# Patient Record
Sex: Male | Born: 1955 | Race: White | Hispanic: No | Marital: Married | Smoking: Former smoker
Health system: Southern US, Community
[De-identification: ages and names within clinical notes are randomized; demographics above are authoritative.]

## PROBLEM LIST (undated history)

## (undated) DIAGNOSIS — I219 Acute myocardial infarction, unspecified: Secondary | ICD-10-CM

## (undated) DIAGNOSIS — K08109 Complete loss of teeth, unspecified cause, unspecified class: Secondary | ICD-10-CM

## (undated) DIAGNOSIS — J8410 Pulmonary fibrosis, unspecified: Secondary | ICD-10-CM

## (undated) DIAGNOSIS — G2581 Restless legs syndrome: Secondary | ICD-10-CM

## (undated) DIAGNOSIS — E7800 Pure hypercholesterolemia, unspecified: Secondary | ICD-10-CM

## (undated) DIAGNOSIS — K219 Gastro-esophageal reflux disease without esophagitis: Secondary | ICD-10-CM

## (undated) DIAGNOSIS — J449 Chronic obstructive pulmonary disease, unspecified: Secondary | ICD-10-CM

## (undated) DIAGNOSIS — R053 Chronic cough: Secondary | ICD-10-CM

## (undated) DIAGNOSIS — Z9981 Dependence on supplemental oxygen: Secondary | ICD-10-CM

## (undated) DIAGNOSIS — I2510 Atherosclerotic heart disease of native coronary artery without angina pectoris: Secondary | ICD-10-CM

## (undated) HISTORY — PX: CORONARY ANGIOPLASTY: SHX604

## (undated) HISTORY — PX: COLONOSCOPY: SHX174

## (undated) HISTORY — PX: BACK SURGERY: SHX140

## (undated) HISTORY — DX: Restless legs syndrome: G25.81

## (undated) HISTORY — PX: OTHER SURGICAL HISTORY: SHX169

---

## 2002-04-05 ENCOUNTER — Encounter: Payer: Self-pay | Admitting: Neurosurgery

## 2002-04-05 ENCOUNTER — Ambulatory Visit (HOSPITAL_COMMUNITY): Admission: RE | Admit: 2002-04-05 | Discharge: 2002-04-05 | Payer: Self-pay | Admitting: Neurosurgery

## 2002-04-06 ENCOUNTER — Ambulatory Visit (HOSPITAL_COMMUNITY): Admission: RE | Admit: 2002-04-06 | Discharge: 2002-04-06 | Payer: Self-pay | Admitting: Neurosurgery

## 2002-04-06 ENCOUNTER — Encounter: Payer: Self-pay | Admitting: Neurosurgery

## 2002-04-15 ENCOUNTER — Encounter: Payer: Self-pay | Admitting: Neurosurgery

## 2002-04-15 ENCOUNTER — Ambulatory Visit (HOSPITAL_COMMUNITY): Admission: RE | Admit: 2002-04-15 | Discharge: 2002-04-15 | Payer: Self-pay | Admitting: Neurosurgery

## 2008-10-20 HISTORY — PX: ANGIOPLASTY: SHX39

## 2009-05-27 ENCOUNTER — Emergency Department: Payer: Self-pay | Admitting: Emergency Medicine

## 2011-09-19 ENCOUNTER — Ambulatory Visit: Payer: Self-pay

## 2014-03-05 ENCOUNTER — Emergency Department: Payer: Self-pay | Admitting: Emergency Medicine

## 2014-03-06 LAB — BASIC METABOLIC PANEL
Anion Gap: 5 — ABNORMAL LOW (ref 7–16)
BUN: 9 mg/dL (ref 7–18)
Calcium, Total: 8.3 mg/dL — ABNORMAL LOW (ref 8.5–10.1)
Chloride: 107 mmol/L (ref 98–107)
Co2: 27 mmol/L (ref 21–32)
Creatinine: 0.84 mg/dL (ref 0.60–1.30)
EGFR (African American): >60
EGFR (Non-African Amer.): >60
Glucose: 111 mg/dL — ABNORMAL HIGH (ref 65–99)
Osmolality: 277 (ref 275–301)
Potassium: 3.7 mmol/L (ref 3.5–5.1)
Sodium: 139 mmol/L (ref 136–145)

## 2014-03-06 LAB — CBC WITH DIFFERENTIAL/PLATELET
Basophil #: 0 10*3/uL (ref 0.0–0.1)
Basophil %: 0.8 %
Eosinophil #: 0.3 10*3/uL (ref 0.0–0.7)
Eosinophil %: 4.8 %
HCT: 40.8 % (ref 40.0–52.0)
HGB: 14 g/dL (ref 13.0–18.0)
Lymphocyte #: 1.8 10*3/uL (ref 1.0–3.6)
Lymphocyte %: 30.6 %
MCH: 33.2 pg (ref 26.0–34.0)
MCHC: 34.2 g/dL (ref 32.0–36.0)
MCV: 97 fL (ref 80–100)
Monocyte #: 0.6 x10 3/mm (ref 0.2–1.0)
Monocyte %: 10.3 %
Neutrophil #: 3.2 10*3/uL (ref 1.4–6.5)
Neutrophil %: 53.5 %
Platelet: 209 10*3/uL (ref 150–440)
RBC: 4.21 10*6/uL — ABNORMAL LOW (ref 4.40–5.90)
RDW: 13.3 % (ref 11.5–14.5)
WBC: 5.9 10*3/uL (ref 3.8–10.6)

## 2014-03-06 LAB — URINALYSIS, COMPLETE
Bacteria: NONE SEEN
Bilirubin,UR: NEGATIVE
Blood: NEGATIVE
Glucose,UR: NEGATIVE mg/dL (ref 0–75)
Ketone: NEGATIVE
Leukocyte Esterase: NEGATIVE
Nitrite: NEGATIVE
Ph: 5 (ref 4.5–8.0)
Protein: NEGATIVE
RBC,UR: 1 /HPF (ref 0–5)
Specific Gravity: 1.025 (ref 1.003–1.030)
Squamous Epithelial: 2
WBC UR: 5 /HPF (ref 0–5)

## 2014-03-06 LAB — TROPONIN I: Troponin-I: <0.02 ng/mL

## 2015-07-20 ENCOUNTER — Other Ambulatory Visit: Payer: Self-pay | Admitting: Cardiology

## 2015-07-20 DIAGNOSIS — M5417 Radiculopathy, lumbosacral region: Secondary | ICD-10-CM

## 2015-07-25 ENCOUNTER — Ambulatory Visit
Admission: RE | Admit: 2015-07-25 | Discharge: 2015-07-25 | Disposition: A | Discharge status: Home / Self Care | Payer: PRIVATE HEALTH INSURANCE | Source: Ambulatory Visit | Attending: Cardiology | Admitting: Cardiology

## 2015-07-25 DIAGNOSIS — M5126 Other intervertebral disc displacement, lumbar region: Secondary | ICD-10-CM | POA: Diagnosis not present

## 2015-07-25 DIAGNOSIS — M4807 Spinal stenosis, lumbosacral region: Secondary | ICD-10-CM | POA: Diagnosis not present

## 2015-07-25 DIAGNOSIS — M5417 Radiculopathy, lumbosacral region: Secondary | ICD-10-CM | POA: Diagnosis present

## 2017-10-28 ENCOUNTER — Encounter: Payer: Self-pay | Admitting: Emergency Medicine

## 2017-10-28 ENCOUNTER — Emergency Department
Admission: EM | Admit: 2017-10-28 | Discharge: 2017-10-28 | Disposition: A | Discharge status: Home / Self Care | Payer: No Typology Code available for payment source | Attending: Emergency Medicine | Admitting: Emergency Medicine

## 2017-10-28 ENCOUNTER — Other Ambulatory Visit: Payer: Self-pay

## 2017-10-28 DIAGNOSIS — Y939 Activity, unspecified: Secondary | ICD-10-CM | POA: Diagnosis not present

## 2017-10-28 DIAGNOSIS — Y92410 Unspecified street and highway as the place of occurrence of the external cause: Secondary | ICD-10-CM | POA: Insufficient documentation

## 2017-10-28 DIAGNOSIS — S3992XA Unspecified injury of lower back, initial encounter: Secondary | ICD-10-CM | POA: Diagnosis present

## 2017-10-28 DIAGNOSIS — I2510 Atherosclerotic heart disease of native coronary artery without angina pectoris: Secondary | ICD-10-CM | POA: Diagnosis not present

## 2017-10-28 DIAGNOSIS — S39012A Strain of muscle, fascia and tendon of lower back, initial encounter: Secondary | ICD-10-CM | POA: Insufficient documentation

## 2017-10-28 DIAGNOSIS — Y999 Unspecified external cause status: Secondary | ICD-10-CM | POA: Insufficient documentation

## 2017-10-28 HISTORY — DX: Atherosclerotic heart disease of native coronary artery without angina pectoris: I25.10

## 2017-10-28 HISTORY — DX: Pure hypercholesterolemia, unspecified: E78.00

## 2017-10-28 MED ORDER — IBUPROFEN 600 MG PO TABS: 600.0000 mg | ORAL_TABLET | Freq: Three times a day (TID) | ORAL | 0 refills | Status: DC | PRN

## 2017-10-28 MED ORDER — NAPROXEN 500 MG PO TABS
500.0000 mg | ORAL_TABLET | Freq: Once | ORAL | Status: AC
  Administered 2017-10-28: 500 mg via ORAL
  Filled 2017-10-28: qty 1

## 2017-10-28 MED ORDER — CYCLOBENZAPRINE HCL 10 MG PO TABS
10.0000 mg | ORAL_TABLET | Freq: Once | ORAL | Status: AC
  Administered 2017-10-28: 10 mg via ORAL
  Filled 2017-10-28: qty 1

## 2017-10-28 MED ORDER — CYCLOBENZAPRINE HCL 10 MG PO TABS: 10.0000 mg | ORAL_TABLET | Freq: Three times a day (TID) | ORAL | 0 refills | Status: DC | PRN
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
## 2018-02-08 ENCOUNTER — Telehealth: Payer: Self-pay

## 2018-02-08 NOTE — Telephone Encounter (Signed)
Gastroenterology Pre-Procedure Review  Request Date: 03/05/2018 Requesting Physician: Dr. Bonna Gains   PATIENT REVIEW QUESTIONS: The patient responded to the following health history questions as indicated:    1. Are you having any GI issues? No  2. Do you have a personal history of Polyps? No  3. Do you have a family history of Colon Cancer or Polyps? No  4. Diabetes Mellitus? No  5. Joint replacements in the past 12 months? No  6. Major health problems in the past 3 months? No  7. Any artificial heart valves, MVP, or defibrillator? Yes, 2 stents, done 2010     MEDICATIONS & ALLERGIES:    Patient reports the following regarding taking any anticoagulation/antiplatelet therapy:   Plavix, Coumadin, Eliquis, Xarelto, Lovenox, Pradaxa, Brilinta, or Effient? Plavix  Aspirin? No   Patient confirms/reports the following medications:  Current Outpatient Medications  Medication Sig Dispense Refill  . clopidogrel (PLAVIX) 75 MG tablet Take 75 mg by mouth every other day. Taking 1/2 tabled every other day    . docusate sodium (COLACE) 100 MG capsule Take 100 mg by mouth 2 (two) times daily.    Marland Kitchen loratadine (CLARITIN) 10 MG tablet Take 10 mg by mouth daily as needed for allergies.    . niacin (NIASPAN) 500 MG CR tablet Take 500 mg by mouth at bedtime.    . nitroGLYCERIN (NITROSTAT) 0.3 MG SL tablet Place 0.3 mg under the tongue every 5 (five) minutes as needed for chest pain.     No current facility-administered medications for this visit.     Patient confirms/reports the following allergies:  Allergies  Allergen Reactions  . Ace Inhibitors Swelling    No orders of the defined types were placed in this encounter.   AUTHORIZATION INFORMATION Primary Insurance: 1D#: Group #:  Secondary Insurance: 1D#: Group #:  SCHEDULE INFORMATION: Date: 03/05/2018  Time: Location: [place]

## 2018-02-11 ENCOUNTER — Other Ambulatory Visit: Payer: Self-pay

## 2018-02-11 ENCOUNTER — Encounter: Payer: Self-pay | Admitting: Gastroenterology

## 2018-02-11 DIAGNOSIS — Z1211 Encounter for screening for malignant neoplasm of colon: Secondary | ICD-10-CM

## 2018-03-04 ENCOUNTER — Encounter: Payer: Self-pay | Admitting: *Deleted

## 2018-03-05 ENCOUNTER — Encounter
Admission: RE | Disposition: A | Discharge status: Home / Self Care | Payer: Self-pay | Source: Ambulatory Visit | Attending: Gastroenterology

## 2018-03-05 ENCOUNTER — Encounter: Payer: Self-pay | Admitting: *Deleted

## 2018-03-05 ENCOUNTER — Ambulatory Visit: Payer: Managed Care, Other (non HMO) | Admitting: Anesthesiology

## 2018-03-05 ENCOUNTER — Ambulatory Visit
Admission: RE | Admit: 2018-03-05 | Discharge: 2018-03-05 | Disposition: A | Discharge status: Home / Self Care | Payer: Managed Care, Other (non HMO) | Source: Ambulatory Visit | Attending: Gastroenterology | Admitting: Gastroenterology

## 2018-03-05 DIAGNOSIS — Z955 Presence of coronary angioplasty implant and graft: Secondary | ICD-10-CM | POA: Diagnosis not present

## 2018-03-05 DIAGNOSIS — Z888 Allergy status to other drugs, medicaments and biological substances status: Secondary | ICD-10-CM | POA: Diagnosis not present

## 2018-03-05 DIAGNOSIS — Z7902 Long term (current) use of antithrombotics/antiplatelets: Secondary | ICD-10-CM | POA: Insufficient documentation

## 2018-03-05 DIAGNOSIS — K625 Hemorrhage of anus and rectum: Secondary | ICD-10-CM | POA: Diagnosis not present

## 2018-03-05 DIAGNOSIS — K6289 Other specified diseases of anus and rectum: Secondary | ICD-10-CM

## 2018-03-05 DIAGNOSIS — Z87891 Personal history of nicotine dependence: Secondary | ICD-10-CM | POA: Diagnosis not present

## 2018-03-05 DIAGNOSIS — K648 Other hemorrhoids: Secondary | ICD-10-CM | POA: Diagnosis not present

## 2018-03-05 DIAGNOSIS — I2510 Atherosclerotic heart disease of native coronary artery without angina pectoris: Secondary | ICD-10-CM | POA: Insufficient documentation

## 2018-03-05 DIAGNOSIS — D122 Benign neoplasm of ascending colon: Secondary | ICD-10-CM | POA: Diagnosis not present

## 2018-03-05 DIAGNOSIS — K5730 Diverticulosis of large intestine without perforation or abscess without bleeding: Secondary | ICD-10-CM | POA: Diagnosis not present

## 2018-03-05 DIAGNOSIS — K621 Rectal polyp: Secondary | ICD-10-CM | POA: Diagnosis not present

## 2018-03-05 DIAGNOSIS — Z1211 Encounter for screening for malignant neoplasm of colon: Secondary | ICD-10-CM | POA: Insufficient documentation

## 2018-03-05 DIAGNOSIS — Z0000 Encounter for general adult medical examination without abnormal findings: Secondary | ICD-10-CM

## 2018-03-05 DIAGNOSIS — Z79899 Other long term (current) drug therapy: Secondary | ICD-10-CM | POA: Diagnosis not present

## 2018-03-05 HISTORY — PX: COLONOSCOPY WITH PROPOFOL: SHX5780

## 2018-03-05 SURGERY — COLONOSCOPY WITH PROPOFOL
Anesthesia: General

## 2018-03-05 MED ORDER — LIDOCAINE HCL (PF) 1 % IJ SOLN
INTRAMUSCULAR | Status: AC
  Administered 2018-03-05: 0.3 mL
  Filled 2018-03-05: qty 2

## 2018-03-05 MED ORDER — PROPOFOL 10 MG/ML IV BOLUS
INTRAVENOUS | Status: DC | PRN
  Administered 2018-03-05: 60 mg via INTRAVENOUS

## 2018-03-05 MED ORDER — PHENYLEPHRINE HCL 10 MG/ML IJ SOLN
INTRAMUSCULAR | Status: DC | PRN
  Administered 2018-03-05 (×2): 100 ug via INTRAVENOUS

## 2018-03-05 MED ORDER — PROPOFOL 500 MG/50ML IV EMUL
INTRAVENOUS | Status: AC
  Filled 2018-03-05: qty 50

## 2018-03-05 MED ORDER — SODIUM CHLORIDE 0.9 % IV SOLN
INTRAVENOUS | Status: DC
  Administered 2018-03-05: 1000 mL via INTRAVENOUS

## 2018-03-05 MED ORDER — PROPOFOL 500 MG/50ML IV EMUL
INTRAVENOUS | Status: DC | PRN
  Administered 2018-03-05: 150 ug/kg/min via INTRAVENOUS

## 2018-03-05 NOTE — Anesthesia Preprocedure Evaluation (Signed)
Anesthesia Evaluation  Patient identified by MRN, date of birth, ID band Patient awake    Reviewed: Allergy & Precautions, H&P , NPO status , Patient's Chart, lab work & pertinent test results, reviewed documented beta blocker date and time   History of Anesthesia Complications Negative for: history of anesthetic complications  Airway Mallampati: III  TM Distance: >3 FB Neck ROM: full    Dental  (+) Edentulous Upper, Dental Advidsory Given, Edentulous Lower   Pulmonary neg pulmonary ROS, former smoker,           Cardiovascular Exercise Tolerance: Good (-) hypertension(-) angina+ CAD and + Cardiac Stents  (-) Past MI and (-) CABG (-) dysrhythmias (-) Valvular Problems/Murmurs     Neuro/Psych negative neurological ROS  negative psych ROS   GI/Hepatic negative GI ROS, Neg liver ROS,   Endo/Other  negative endocrine ROS  Renal/GU negative Renal ROS  negative genitourinary   Musculoskeletal   Abdominal   Peds  Hematology negative hematology ROS (+)   Anesthesia Other Findings Past Medical History: No date: Coronary artery disease No date: Elevated cholesterol No date: Restless leg syndrome   Reproductive/Obstetrics negative OB ROS                             Anesthesia Physical Anesthesia Plan  ASA: II  Anesthesia Plan: General   Post-op Pain Management:    Induction: Intravenous  PONV Risk Score and Plan: 2 and Propofol infusion  Airway Management Planned: Natural Airway and Nasal Cannula  Additional Equipment:   Intra-op Plan:   Post-operative Plan:   Informed Consent: I have reviewed the patients History and Physical, chart, labs and discussed the procedure including the risks, benefits and alternatives for the proposed anesthesia with the patient or authorized representative who has indicated his/her understanding and acceptance.   Dental Advisory Given  Plan Discussed  with: Anesthesiologist, CRNA and Surgeon  Anesthesia Plan Comments:         Anesthesia Quick Evaluation

## 2018-03-05 NOTE — Op Note (Signed)
[facility] Gastroenterology Patient Name: [place] [name] Procedure Date: 03/05/2018 8:04 AM MRN: [id number] Account #: [id number] Date of Birth: July 09, 1956 Admit Type: Outpatient Age: 62 Room: [facility] ENDO ROOM 2 Gender: Male Note Status: Finalized Procedure:            Colonoscopy Indications:          Screening for colorectal malignant neoplasm, Incidental                        - Rectal bleeding Providers:            Ilyanna Baillargeon B. Bonna Gains MD, MD Referring MD:         [name], MD (Referring MD) Medicines:            Monitored Anesthesia Care Complications:        No immediate complications. Procedure:            Pre-Anesthesia Assessment:                       - ASA Grade Assessment: II - A patient with mild                        systemic disease.                       - Prior to the procedure, a History and Physical was                        performed, and patient medications, allergies and                        sensitivities were reviewed. The patient's tolerance of                        previous anesthesia was reviewed.                       - The risks and benefits of the procedure and the                        sedation options and risks were discussed with the                        patient. All questions were answered and informed                        consent was obtained.                       - Patient identification and proposed procedure were                        verified prior to the procedure by the physician, the                        nurse, the anesthesiologist, the anesthetist and the                        technician. The procedure was verified in the procedure  room.                       After obtaining informed consent, the colonoscope was                        passed under direct vision. Throughout the procedure,                        the patient's blood pressure, pulse, and oxygen   saturations were monitored continuously. The                        Colonoscope was introduced through the anus and                        advanced to the the cecum, identified by appendiceal                        orifice and ileocecal valve. The colonoscopy was                        performed with ease. The patient tolerated the                        procedure well. The quality of the bowel preparation                        was good. Findings:      The perianal and digital rectal examinations were normal.      Four sessile polyps were found in the rectum and ascending colon. The       polyps were 2 to 3 mm in size. These polyps were removed with a cold       biopsy forceps. Resection and retrieval were complete.      Multiple diverticula were found in the sigmoid colon.      A patchy area of mildly erythematous mucosa was found in the rectum.       This was biopsied with a cold forceps for histology.      The exam was otherwise without abnormality.      The rectum, sigmoid colon, descending colon, transverse colon, ascending       colon and cecum appeared normal.      Non-bleeding internal hemorrhoids were found during retroflexion. Impression:           This is the source of patient's intermittent BRBPR.                       - Four 2 to 3 mm polyps in the rectum and in the                        ascending colon, removed with a cold biopsy forceps.                        Resected and retrieved.                       - Diverticulosis in the sigmoid colon.                       - Erythematous mucosa in the rectum. Biopsied.                       -  The examination was otherwise normal.                       - The rectum, sigmoid colon, descending colon,                        transverse colon, ascending colon and cecum are normal.                       - Non-bleeding internal hemorrhoids. Recommendation:       - Discharge patient to home (with escort).                       - Advance  diet as tolerated.                       - Continue present medications.                       - Await pathology results.                       - Repeat colonoscopy in 5 years for surveillance.                       - The findings and recommendations were discussed with                        the patient.                       - The findings and recommendations were discussed with                        the patient's family.                       - Return to primary care physician as previously                        scheduled.                       - High fiber diet. Procedure Code(s):    --- Professional ---                       [phone], Colonoscopy, flexible; with biopsy, single or                        multiple Diagnosis Code(s):    --- Professional ---                       Z12.11, Encounter for screening for malignant neoplasm                        of colon                       K62.1, Rectal polyp                       D12.2, Benign neoplasm of ascending colon  K64.8, Other hemorrhoids                       K62.89, Other specified diseases of anus and rectum                       K57.30, Diverticulosis of large intestine without                        perforation or abscess without bleeding CPT copyright 2017 American Medical Association. All rights reserved. The codes documented in this report are preliminary and upon coder review may  be revised to meet current compliance requirements.  Vonda Antigua, MD Margretta Sidle B. Bonna Gains MD, MD 03/05/2018 8:59:52 AM This report has been signed electronically. Number of Addenda: 0 Note Initiated On: 03/05/2018 8:04 AM Scope Withdrawal Time: 0 hours 34 minutes 37 seconds  Total Procedure Duration: 0 hours 40 minutes 26 seconds  Estimated Blood Loss: Estimated blood loss: none.      [facility]

## 2018-03-05 NOTE — H&P (Signed)
[place] Antigua, MD [address] [place], [place], [place], [address] [id number], [place], [name], [place], [address] Phone: [phone]  Fax: [phone]  Primary Care Physician:  [place] Athens, MD   Pre-Procedure History & Physical: HPI:  [place] [name] is a 62 y.o. male is here for a colonoscopy.   Past Medical History:  Diagnosis Date  . Coronary artery disease   . Elevated cholesterol   . Restless leg syndrome     Past Surgical History:  Procedure Laterality Date  . ANGIOPLASTY  2010   2 stents placed  . BACK SURGERY    . CORONARY ANGIOPLASTY      Prior to Admission medications   Medication Sig Start Date End Date Taking? Authorizing Provider  clopidogrel (PLAVIX) 75 MG tablet Take 75 mg by mouth every other day. Taking 1/2 tabled every other day   Yes [provider]  loratadine (CLARITIN) 10 MG tablet Take 10 mg by mouth daily as needed for allergies.   Yes [provider]  niacin (NIASPAN) 500 MG CR tablet Take 500 mg by mouth at bedtime.   Yes [provider]  docusate sodium (COLACE) 100 MG capsule Take 100 mg by mouth 2 (two) times daily.    [provider]  nitroGLYCERIN (NITROSTAT) 0.3 MG SL tablet Place 0.3 mg under the tongue every 5 (five) minutes as needed for chest pain.    [provider]    Allergies as of 02/11/2018 - Review Complete 10/28/2017  Allergen Reaction Noted  . Ace inhibitors Swelling 10/28/2017    History reviewed. No pertinent family history.  Social History   Socioeconomic History  . Marital status: Married    Spouse name: Not on file  . Number of children: Not on file  . Years of education: Not on file  . Highest education level: Not on file  Occupational History  . Not on file  Social Needs  . Financial resource strain: Not on file  . Food insecurity:    Worry: Not on file    Inability: Not on file  . Transportation needs:    Medical: Not on file    Non-medical:  Not on file  Tobacco Use  . Smoking status: Former Smoker    Last attempt to quit: 03/05/2009    Years since quitting: 9.0  . Smokeless tobacco: Never Used  Substance and Sexual Activity  . Alcohol use: Yes    Frequency: Never  . Drug use: Not Currently  . Sexual activity: Not on file  Lifestyle  . Physical activity:    Days per week: Not on file    Minutes per session: Not on file  . Stress: Not on file  Relationships  . Social connections:    Talks on phone: Not on file    Gets together: Not on file    Attends religious service: Not on file    Active member of club or organization: Not on file    Attends meetings of clubs or organizations: Not on file    Relationship status: Not on file  . Intimate partner violence:    Fear of current or ex partner: Not on file    Emotionally abused: Not on file    Physically abused: Not on file    Forced sexual activity: Not on file  Other Topics Concern  . Not on file  Social History Narrative  . Not on file    Review of Systems: See HPI, otherwise negative ROS  Physical Exam: BP 138/89   Pulse 78   Temp 97.6 F (36.4 C) (Tympanic)   Resp 18   Ht 6\' 2"  (1.88 m)   Wt 220 lb (99.8 kg)   SpO2 97%   BMI 28.25 kg/m  General:   Alert,  pleasant and cooperative in NAD Head:  Normocephalic and atraumatic. Neck:  Supple; no masses or thyromegaly. Lungs:  Clear throughout to auscultation, normal respiratory effort.    Heart:  +S1, +S2, Regular rate and rhythm, No edema. Abdomen:  Soft, nontender and nondistended. Normal bowel sounds, without guarding, and without rebound.   Neurologic:  Alert and  oriented x4;  grossly normal neurologically.  Impression/Plan: [name] is here for a colonoscopy to be performed for average risk screening. Also reports intermittent blood on toilet paper over the last month.   Risks, benefits, limitations, and alternatives regarding  colonoscopy have been reviewed with the patient.  Questions  have been answered.  All parties agreeable.   Virgel Manifold, MD  03/05/2018, 7:57 AM

## 2018-03-05 NOTE — Transfer of Care (Signed)
Immediate Anesthesia Transfer of Care Note  Patient: [place] [name]  Procedure(s) Performed: COLONOSCOPY WITH PROPOFOL (N/A )  Patient Location: PACU  Anesthesia Type:General  Level of Consciousness: sedated  Airway & Oxygen Therapy: Patient Spontanous Breathing and Patient connected to nasal cannula oxygen  Post-op Assessment: Report given to RN and Post -op Vital signs reviewed and stable  Post vital signs: Reviewed and stable  Last Vitals:  Vitals Value Taken Time  BP 104/78 03/05/2018  8:58 AM  Temp 36.1 C 03/05/2018  8:50 AM  Pulse 62 03/05/2018  8:58 AM  Resp 17 03/05/2018  8:58 AM  SpO2 100 % 03/05/2018  8:58 AM  Vitals shown include unvalidated device data.  Last Pain:  Vitals:   03/05/18 0850  TempSrc: Tympanic  PainSc:          Complications: No apparent anesthesia complications

## 2018-03-05 NOTE — Anesthesia Post-op Follow-up Note (Signed)
Anesthesia QCDR form completed.        

## 2018-03-05 NOTE — Anesthesia Procedure Notes (Signed)
Date/Time: 03/05/2018 8:12 AM Performed by: Nelda Marseille, CRNA Pre-anesthesia Checklist: Patient identified, Emergency Drugs available, Suction available, Patient being monitored and Timeout performed

## 2018-03-06 NOTE — Anesthesia Postprocedure Evaluation (Signed)
Anesthesia Post Note  Patient: [place] [name]  Procedure(s) Performed: COLONOSCOPY WITH PROPOFOL (N/A )  Patient location during evaluation: Endoscopy Anesthesia Type: General Level of consciousness: awake and alert Pain management: pain level controlled Vital Signs Assessment: post-procedure vital signs reviewed and stable Respiratory status: spontaneous breathing, nonlabored ventilation, respiratory function stable and patient connected to nasal cannula oxygen Cardiovascular status: blood pressure returned to baseline and stable Postop Assessment: no apparent nausea or vomiting Anesthetic complications: no     Last Vitals:  Vitals:   03/05/18 0910 03/05/18 0920  BP: 119/78 134/87  Pulse: 63 61  Resp: 19 17  Temp:    SpO2: 100% 100%    Last Pain:  Vitals:   03/05/18 0850  TempSrc: Tympanic  PainSc:                  [name]

## 2018-03-08 ENCOUNTER — Encounter: Payer: Self-pay | Admitting: Gastroenterology

## 2018-03-08 LAB — SURGICAL PATHOLOGY

## 2018-03-09 ENCOUNTER — Encounter: Payer: Self-pay | Admitting: Gastroenterology

## 2018-03-16 ENCOUNTER — Telehealth: Payer: Self-pay | Admitting: Gastroenterology

## 2018-03-16 NOTE — Telephone Encounter (Signed)
Pt notified that polyps were benign, not precancerous and that a repeat colonoscopy needed in 3 yrs. Pt informed that letter was sent.

## 2018-03-16 NOTE — Telephone Encounter (Signed)
Pt is calling  To see if biopsy results are back

## 2018-06-10 ENCOUNTER — Ambulatory Visit
Admission: RE | Admit: 2018-06-10 | Discharge: 2018-06-10 | Disposition: A | Discharge status: Home / Self Care | Payer: Managed Care, Other (non HMO) | Source: Ambulatory Visit | Attending: Internal Medicine | Admitting: Internal Medicine

## 2018-06-10 ENCOUNTER — Other Ambulatory Visit: Payer: Self-pay | Admitting: Internal Medicine

## 2018-06-10 DIAGNOSIS — R05 Cough: Secondary | ICD-10-CM

## 2018-06-10 DIAGNOSIS — R059 Cough, unspecified: Secondary | ICD-10-CM

## 2018-06-10 DIAGNOSIS — R918 Other nonspecific abnormal finding of lung field: Secondary | ICD-10-CM | POA: Diagnosis not present

## 2018-06-11 ENCOUNTER — Other Ambulatory Visit: Payer: Self-pay | Admitting: Internal Medicine

## 2018-06-11 DIAGNOSIS — R05 Cough: Secondary | ICD-10-CM

## 2018-06-11 DIAGNOSIS — R058 Other specified cough: Secondary | ICD-10-CM

## 2018-06-11 DIAGNOSIS — R9389 Abnormal findings on diagnostic imaging of other specified body structures: Secondary | ICD-10-CM

## 2018-06-15 ENCOUNTER — Encounter: Payer: Self-pay | Admitting: Internal Medicine

## 2018-06-15 ENCOUNTER — Ambulatory Visit: Admission: RE | Admit: 2018-06-15 | Payer: Managed Care, Other (non HMO) | Source: Ambulatory Visit

## 2018-06-15 ENCOUNTER — Ambulatory Visit: Payer: Managed Care, Other (non HMO) | Admitting: Internal Medicine

## 2018-06-15 VITALS — BP 124/70 | HR 81 | Resp 16 | Ht 74.0 in | Wt 209.0 lb

## 2018-06-15 DIAGNOSIS — R059 Cough, unspecified: Secondary | ICD-10-CM

## 2018-06-15 DIAGNOSIS — J849 Interstitial pulmonary disease, unspecified: Secondary | ICD-10-CM

## 2018-06-15 DIAGNOSIS — R0609 Other forms of dyspnea: Secondary | ICD-10-CM | POA: Diagnosis not present

## 2018-06-15 DIAGNOSIS — R05 Cough: Secondary | ICD-10-CM | POA: Diagnosis not present

## 2018-06-15 NOTE — Patient Instructions (Addendum)
Will obtain Ct chest hi resolution in about 6 weeks and follow up after that.   Will need to obtain results of your recent blood work from Dr. Jennette Kettle office.

## 2018-06-15 NOTE — Progress Notes (Signed)
[facility] Consultation      Assessment and Plan:  Interstitial lung disease. - Interstitial changes with bronchiectasis seen on chest x-ray.  Uncertain if these changes are recent and related to his new cough, versus chronic changes.  Therefore allow his cough to travel over the next 6 to 8 weeks check a CT chest at that time. Patient is asked to follow-up after CT chest in 6 weeks time. - Patient has had recent blood work at Dr. Paticia Stack office, will obtain results.  Will consider further serological work-up if this was not done already.  Cough. - Recent URI with persistent cough.  Possibly represents a postinfectious cough. - Asked to use over-the-counter cough medicines as needed.   Date: 06/15/2018  MRN# [id number] Dillon [name] [birth year]   Dillon [name] is a 62 y.o. old male seen in consultation for chief complaint of:    Chief Complaint  Patient presents with  . Consult    Referred by Dr.Masoud for eval ILD  . Cough    yellow mucus  . Wheezing    occasional    HPI:   The patient is a 62 yo male with cough for about 2 months. It came on as a cold, the cold went away and the cough stayed.  The cough is intermittent, better on some days, it has improved slowly over the past few weeks. He was sent for a CXR which showed interstitial changes and then sent for CT of the chest but was not cleared by insurance, so it was cancelled.  Aside from the cough he feels that his breathing has been doing ok. 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 He thinks that the cough had started before he started working.  He spent much of his life working in [employer] fund[employer].  He has a dog, sleeps in bed with them.  He has some reflux, does have sinus drainage.  He smokes until 2010, about 3 ppd.  He takes plavix for MI in 2010.  He snores at night, he denies excessive sleepiness.   **CXR 06/10/18  PMHX:   Past Medical History:    Diagnosis Date  . Coronary artery disease   . Elevated cholesterol   . Restless leg syndrome    Surgical Hx:  Past Surgical History:  Procedure Laterality Date  . ANGIOPLASTY  2010   2 stents placed  . BACK SURGERY    . COLONOSCOPY WITH PROPOFOL N/A 03/05/2018   Procedure: COLONOSCOPY WITH PROPOFOL;  Surgeon: Virgel Manifold, MD;  Location: [facility] ENDOSCOPY;  Service: Endoscopy;  Laterality: N/A;  . CORONARY ANGIOPLASTY     Family Hx:  History reviewed. No pertinent family history. Social Hx:   Social History   Tobacco Use  . Smoking status: Former Smoker    Last attempt to quit: 03/05/2009    Years since quitting: 9.2  . Smokeless tobacco: Never Used  Substance Use Topics  . Alcohol use: Yes    Frequency: Never  . Drug use: Not Currently   Medication:    Current Outpatient Medications:  .  atorvastatin (LIPITOR) 40 MG tablet, Take 40 mg by mouth daily., Disp: , Rfl: 8 .  clopidogrel (PLAVIX) 75 MG tablet, Take 75 mg by mouth every other day. Taking 1/2 tabled every other day, Disp: , Rfl:  .  nitroGLYCERIN (NITROSTAT) 0.3 MG SL tablet, Place 0.3 mg under the tongue every 5 (five) minutes as needed  for chest pain., Disp: , Rfl:    Allergies:  Ace inhibitors  Review of Systems: Gen:  Denies  fever, sweats, chills HEENT: Denies blurred vision, double vision. bleeds, sore throat Cvc:  No dizziness, chest pain. Resp:   Denies cough or sputum production, shortness of breath Gi: Denies swallowing difficulty, stomach pain. Gu:  Denies bladder incontinence, burning urine Ext:   No Joint pain, stiffness. Skin: No skin rash,  hives  Endoc:  No polyuria, polydipsia. Psych: No depression, insomnia. Other:  All other systems were reviewed with the patient and were negative other that what is mentioned in the HPI.   Physical Examination:   VS: BP 124/70 (BP Location: Left Arm, Cuff Size: Large)   Pulse 81   Resp 16   Ht 6\' 2"  (1.88 m)   Wt 209 lb (94.8 kg)   SpO2 94%    BMI 26.83 kg/m   General Appearance: No distress  Neuro:without focal findings,  speech normal,  HEENT: PERRLA, EOM intact.   Pulmonary: normal breath sounds, No wheezing.  CardiovascularNormal S1,S2.  No m/r/g.   Abdomen: Benign, Soft, non-tender. Renal:  No costovertebral tenderness  GU:  No performed at this time. Endoc: No evident thyromegaly, no signs of acromegaly. Skin:   warm, no rashes, no ecchymosis  Extremities: normal, no cyanosis, clubbing.  Other findings:    LABORATORY PANEL:   CBC No results for input(s): WBC, HGB, HCT, PLT in the last 168 hours. ------------------------------------------------------------------------------------------------------------------  Chemistries  No results for input(s): NA, K, CL, CO2, GLUCOSE, BUN, CREATININE, CALCIUM, MG, AST, ALT, ALKPHOS, BILITOT in the last 168 hours.  Invalid input(s): GFRCGP ------------------------------------------------------------------------------------------------------------------  Cardiac Enzymes No results for input(s): TROPONINI in the last 168 hours. ------------------------------------------------------------  RADIOLOGY:  No results found.     Thank  you for the consultation and for allowing [facility] to assist in the care of your patient. Our recommendations are noted above.  Please contact us if we can be of further service.   Marda Stalker, M.D., F.C.C.P.  Board Certified in Internal Medicine, Pulmonary Medicine, [place], and Sleep Medicine.  [facility] Office Number: [phone]   06/15/2018

## 2018-06-17 ENCOUNTER — Telehealth: Payer: Self-pay | Admitting: Internal Medicine

## 2018-06-17 ENCOUNTER — Other Ambulatory Visit: Payer: Self-pay | Admitting: Internal Medicine

## 2018-06-17 ENCOUNTER — Other Ambulatory Visit: Payer: Self-pay | Admitting: Cardiology

## 2018-06-17 NOTE — Telephone Encounter (Signed)
Returned call to spouse and advised no new meds prescribed.

## 2018-06-17 NOTE — Telephone Encounter (Signed)
Called to advised patient of CT Chest appointment scheduled for 08/09/18 at 4:00 at [facility].  Pt was at work and wife wanted to know if Dr. Ashby Dawes had prescribed any medications for patient.  Please contact wife to advise, pt was unsure. Rhonda J Cobb

## 2018-07-06 ENCOUNTER — Telehealth: Payer: Self-pay | Admitting: Internal Medicine

## 2018-07-06 NOTE — Telephone Encounter (Signed)
Pt wife would like to know when pt CT scan is scheduled.

## 2018-07-06 NOTE — Telephone Encounter (Signed)
Called and advised pt's wife that CT Chest scan scheduled for 08/09/18 at 3:45 at Outpatient [facility] at [facility] and also F/U appointment with Dr. Juanell Fairly on Monday 08/16/18 at 4:00 pm.  Information mailed to patient. Nothing else needed at this time. Rhonda J Cobb

## 2018-08-09 ENCOUNTER — Ambulatory Visit
Admission: RE | Admit: 2018-08-09 | Discharge: 2018-08-09 | Disposition: A | Discharge status: Home / Self Care | Payer: Managed Care, Other (non HMO) | Source: Ambulatory Visit | Attending: Internal Medicine | Admitting: Internal Medicine

## 2018-08-09 DIAGNOSIS — J849 Interstitial pulmonary disease, unspecified: Secondary | ICD-10-CM | POA: Insufficient documentation

## 2018-08-09 DIAGNOSIS — I2510 Atherosclerotic heart disease of native coronary artery without angina pectoris: Secondary | ICD-10-CM | POA: Insufficient documentation

## 2018-08-09 DIAGNOSIS — J432 Centrilobular emphysema: Secondary | ICD-10-CM | POA: Insufficient documentation

## 2018-08-09 DIAGNOSIS — J438 Other emphysema: Secondary | ICD-10-CM | POA: Diagnosis not present

## 2018-08-09 DIAGNOSIS — I700 Atherosclerosis of aorta: Secondary | ICD-10-CM | POA: Insufficient documentation

## 2018-08-16 ENCOUNTER — Encounter: Payer: Self-pay | Admitting: Internal Medicine

## 2018-08-16 ENCOUNTER — Ambulatory Visit (INDEPENDENT_AMBULATORY_CARE_PROVIDER_SITE_OTHER): Payer: Managed Care, Other (non HMO) | Admitting: Internal Medicine

## 2018-08-16 DIAGNOSIS — J849 Interstitial pulmonary disease, unspecified: Secondary | ICD-10-CM

## 2018-08-16 NOTE — Progress Notes (Addendum)
[facility] Consultation      Assessment and Plan:  Interstitial lung disease. - Interstitial changes seen on Ct-hi-res. Repeat CT hi-res in one year from previous.  - Patient has had recent blood work at Dr. Paticia Stack office, will obtain results,  -- He may have HP due to work/environmental exposure. Explained that we will continue to monitor this. He is concerned about the several hundreds of dollars worth of testing/imaging that he has had. Will check with Dr. Georga Hacking office to try to coordinate what has been done already. Would like to see results from Sed rate, ESR, RF, Hypersensitivity pneumonitis panel, ANCA, ACE, Serology panel.   Cough.  - Recent URI which was likely causing cough, this is now better.  - Some of the patients cough be due to underlying ILD.   Orders Placed This Encounter  Procedures  . CT CHEST HIGH RESOLUTION   Return in about 10 months (around 06/17/2019).  Greater than 50% of the 25 minute visit was spent in counseling/coordination of care regarding testing and follow up of pulmonary fibrosis.   Date: 08/16/2018  MRN# [id number] Dillon [name] [birth year]   Dillon [name] is a 62 y.o. old male seen in consultation for chief complaint of:    No chief complaint on file.   HPI:   The patient is a 62 year old male with a history of persistent cough.  Chest x-ray was suggestive of interstitial lung disease, he was therefore sent for a CT chest high resolution which confirmed the presence of mild bilateral interstitial lung disease.  Today he feels that the breathing and coughing are much better. He does not think that his breathing has declined over the past few months or years. He does somewhat physical labor and he does not feel limited by breathing. He is not taking any inhalers current.   He used to smoked 2.5 to 3 ppd, he stopped about 9 years ago.  He has been working in [employer] fund[employer] since the age of 62 years old, and he continues to  this at 2 different jobs.  He has a dog, sleeps in bed with them.  He has minimal to no reflux, does have occasional sinus drainage.   He takes plavix for MI in 2010.  He snores at night, he denies excessive sleepiness.    **CT chest 08/09/2018>> bilateral peripheral interstitial changes with mild honeycomb change, groundglass change.  Emphysema. **CXR 06/10/18  Medication:    Current Outpatient Medications:  .  atorvastatin (LIPITOR) 40 MG tablet, Take 40 mg by mouth daily., Disp: , Rfl: 8 .  clopidogrel (PLAVIX) 75 MG tablet, Take 75 mg by mouth every other day. Taking 1/2 tabled every other day, Disp: , Rfl:  .  nitroGLYCERIN (NITROSTAT) 0.3 MG SL tablet, Place 0.3 mg under the tongue every 5 (five) minutes as needed for chest pain., Disp: , Rfl:    Allergies:  Ace inhibitors    Review of Systems:  Constitutional: Feels well. Cardiovascular: Denies chest pain, exertional chest pain.  Pulmonary: Denies hemoptysis, pleuritic chest pain.   The remainder of systems were reviewed and were found to be negative other than what is documented in the HPI.    Physical Examination:   VS: BP 130/72 (BP Location: Left Arm, Cuff Size: Normal)   Pulse 78   Ht 6' 2" (1.88 m)   SpO2 92%   BMI 26.83 kg/m   General Appearance: No distress  Neuro:without focal findings, mental status, speech normal, alert and  oriented HEENT: PERRLA, EOM intact Pulmonary: No wheezing, No rales  CardiovascularNormal S1,S2.  No m/r/g.  Abdomen: Benign, Soft, non-tender, No masses Renal:  No costovertebral tenderness  GU:  No performed at this time. Endoc: No evident thyromegaly, no signs of acromegaly or Cushing features Skin:   warm, no rashes, no ecchymosis  Extremities: normal, no cyanosis, clubbing.    LABORATORY PANEL:   CBC No results for input(s): WBC, HGB, HCT, PLT in the last 168  hours. ------------------------------------------------------------------------------------------------------------------  Chemistries  No results for input(s): NA, K, CL, CO2, GLUCOSE, BUN, CREATININE, CALCIUM, MG, AST, ALT, ALKPHOS, BILITOT in the last 168 hours.  Invalid input(s): GFRCGP ------------------------------------------------------------------------------------------------------------------  Cardiac Enzymes No results for input(s): TROPONINI in the last 168 hours. ------------------------------------------------------------  RADIOLOGY:  No results found.     Thank  you for the consultation and for allowing [facility] to assist in the care of your patient. Our recommendations are noted above.  Please contact us if we can be of further service.   Marda Stalker, M.D., F.C.C.P.  Board Certified in Internal Medicine, Pulmonary Medicine, [place], and Sleep Medicine.  [facility] Office Number: [phone]   08/16/2018

## 2018-08-16 NOTE — Patient Instructions (Signed)
Will check a High Resolution CT scan of your chest in 9 months.  Will need to obtain blood work from Dr. Paticia Stack office.

## 2018-09-22 ENCOUNTER — Encounter: Payer: Self-pay | Admitting: Gastroenterology

## 2018-09-22 ENCOUNTER — Ambulatory Visit (INDEPENDENT_AMBULATORY_CARE_PROVIDER_SITE_OTHER): Payer: Managed Care, Other (non HMO) | Admitting: Gastroenterology

## 2018-09-22 VITALS — BP 147/84 | HR 69 | Ht 74.0 in | Wt 213.2 lb

## 2018-09-22 DIAGNOSIS — K648 Other hemorrhoids: Secondary | ICD-10-CM

## 2018-09-22 DIAGNOSIS — K5900 Constipation, unspecified: Secondary | ICD-10-CM | POA: Diagnosis not present

## 2018-09-22 MED ORDER — HYDROCORTISONE ACETATE 25 MG RE SUPP: 25.0000 mg | Freq: Every day | RECTAL | 0 refills | Status: AC

## 2018-09-22 NOTE — Patient Instructions (Signed)
Miralax daily Rx for Anusol suppositories sent to pharmacy   High-Fiber Diet Fiber, also called dietary fiber, is a type of carbohydrate found in fruits, vegetables, whole grains, and beans. A high-fiber diet can have many health benefits. Your health care provider may recommend a high-fiber diet to help:  Prevent constipation. Fiber can make your bowel movements more regular.  Lower your cholesterol.  Relieve hemorrhoids, uncomplicated diverticulosis, or irritable bowel syndrome.  Prevent overeating as part of a weight-loss plan.  Prevent heart disease, type 2 diabetes, and certain cancers.  What is my plan? The recommended daily intake of fiber includes:  38 grams for men under age 71.  23 grams for men over age 64.  29 grams for women under age 32.  12 grams for women over age 35.  You can get the recommended daily intake of dietary fiber by eating a variety of fruits, vegetables, grains, and beans. Your health care provider may also recommend a fiber supplement if it is not possible to get enough fiber through your diet. What do I need to know about a high-fiber diet?  Fiber supplements have not been widely studied for their effectiveness, so it is better to get fiber through food sources.  Always check the fiber content on thenutrition facts label of any prepackaged food. Look for foods that contain at least 5 grams of fiber per serving.  Ask your dietitian if you have questions about specific foods that are related to your condition, especially if those foods are not listed in the following section.  Increase your daily fiber consumption gradually. Increasing your intake of dietary fiber too quickly may cause bloating, cramping, or gas.  Drink plenty of water. Water helps you to digest fiber. What foods can I eat? Grains Whole-grain breads. Multigrain cereal. Oats and oatmeal. Brown rice. Barley. Bulgur wheat. [place]. Bran muffins. Popcorn. Rye wafer  crackers. Vegetables Sweet potatoes. Spinach. Kale. Artichokes. Cabbage. Broccoli. Green peas. Carrots. Squash. Fruits Berries. Pears. Apples. Oranges. Avocados. Prunes and raisins. Dried figs. Meats and Other Protein Sources [place], kidney, pinto, and soy beans. Split peas. Lentils. Nuts and seeds. Dairy Fiber-fortified yogurt. Beverages Fiber-fortified soy milk. Fiber-fortified orange juice. Other Fiber bars. The items listed above may not be a complete list of recommended foods or beverages. Contact your dietitian for more options. What foods are not recommended? Grains White bread. Pasta made with refined flour. White rice. Vegetables Fried potatoes. Canned vegetables. Well-cooked vegetables. Fruits Fruit juice. Cooked, strained fruit. Meats and Other Protein Sources Fatty cuts of meat. Fried Sales executive or fried fish. Dairy Milk. Yogurt. Cream cheese. Sour cream. Beverages Soft drinks. Other Cakes and pastries. Butter and oils. The items listed above may not be a complete list of foods and beverages to avoid. Contact your dietitian for more information. What are some tips for including high-fiber foods in my diet?  Eat a wide variety of high-fiber foods.  Make sure that half of all grains consumed each day are whole grains.  Replace breads and cereals made from refined flour or white flour with whole-grain breads and cereals.  Replace white rice with brown rice, bulgur wheat, or millet.  Start the day with a breakfast that is high in fiber, such as a cereal that contains at least 5 grams of fiber per serving.  Use beans in place of meat in soups, salads, or pasta.  Eat high-fiber snacks, such as berries, raw vegetables, nuts, or popcorn. This information is not intended to replace advice given to  you by your health care provider. Make sure you discuss any questions you have with your health care provider. Document Released: 10/06/2005 Document Revised: 03/13/2016 Document  Reviewed: 03/21/2014 Elsevier Interactive Patient Education  [employer].

## 2018-09-23 NOTE — Progress Notes (Signed)
Dillon [name] [place]  [name], [name] [address]  Main: [phone]  Fax: [phone]   Gastroenterology Consultation  Referring Provider:     [name], MD Primary Care Physician:  [name], MD Primary Gastroenterologist:  Dr. [name] Reason for Consultation:     Bright red blood per rectum        HPI:    Chief Complaint  Patient presents with  . Rectal Bleed, painful bowel movements    Dillon [name] is a 62 y.o. y/o male referred for consultation & management  by Dr. [name], MD.  Patient reports rectal pain with bowel movements, and intermittent bright red blood per rectum both on tissue paper and toilet bowl.  He has tried hemorrhoid cream over-the-counter and this has not helped.  No nausea vomiting.  No weight loss.  No dysphagia.  No heartburn.  Has tried a high-fiber diet but is still not having soft bowel movements without straining on a daily basis.  Patient underwent screening colonoscopy in May 2019 at that time had also mentioned about his of bright red blood per rectum.  Colonoscopy showed 4 polyps 2 to 3 mm in size and were removed.  [place] were reported.  Mild rectal erythema was also reported.  Nonbleeding internal hemorrhoids were seen.  Pathology showed sessile serrated adenoma and tubular adenoma polyps.  Rectal erythema showed focal lymphoid aggregate.  Past Medical History:  Diagnosis Date  . Coronary artery disease   . Elevated cholesterol   . Restless leg syndrome     Past Surgical History:  Procedure Laterality Date  . ANGIOPLASTY  2010   2 stents placed  . BACK SURGERY    . COLONOSCOPY WITH PROPOFOL N/A 03/05/2018   Procedure: COLONOSCOPY WITH PROPOFOL;  Surgeon: Virgel Manifold, MD;  Location: [facility] ENDOSCOPY;  Service: Endoscopy;  Laterality: N/A;  . CORONARY ANGIOPLASTY      Prior to Admission medications   Medication Sig Start Date End Date Taking? Authorizing Provider    atorvastatin (LIPITOR) 40 MG tablet Take 40 mg by mouth daily. 06/10/18  Yes [provider]  clopidogrel (PLAVIX) 75 MG tablet Take 75 mg by mouth every other day. Taking 1/2 tabled every other day   Yes [provider]  nitroGLYCERIN (NITROSTAT) 0.3 MG SL tablet Place 0.3 mg under the tongue every 5 (five) minutes as needed for chest pain.   Yes [provider]  polyethylene glycol (MIRALAX / GLYCOLAX) packet Take 17 g by mouth daily.   Yes Virgel Manifold, MD  hydrocortisone (ANUSOL-HC) 25 MG suppository Place 1 suppository (25 mg total) rectally at bedtime for 10 days. 09/22/18 10/02/18  Virgel Manifold, MD    History reviewed. No pertinent family history.   Social History   Tobacco Use  . Smoking status: Former Smoker    Last attempt to quit: 03/05/2009    Years since quitting: 9.5  . Smokeless tobacco: Never Used  Substance Use Topics  . Alcohol use: Yes    Frequency: Never  . Drug use: Not Currently    Allergies as of 09/22/2018 - Review Complete 09/22/2018  Allergen Reaction Noted  . Ace inhibitors Swelling 10/28/2017    Review of Systems:    All systems reviewed and negative except where noted in HPI.   Physical Exam:  BP (!) 147/84   Pulse 69   Ht 6\' 2"  (1.88 m)   Wt 213 lb 3.2 oz (96.7 kg)  BMI 27.37 kg/m  No LMP for male patient. Psych:  Alert and cooperative. Normal mood and affect. General:   Alert,  Well-developed, well-nourished, pleasant and cooperative in NAD Head:  Normocephalic and atraumatic. Eyes:  Sclera clear, no icterus.   Conjunctiva pink. Ears:  Normal auditory acuity. Nose:  No deformity, discharge, or lesions. Mouth:  No deformity or lesions,oropharynx pink & moist. Neck:  Supple; no masses or thyromegaly. Abdomen:  Normal bowel sounds.  No bruits.  Soft, non-tender and non-distended without masses, hepatosplenomegaly or hernias noted.  No guarding or rebound tenderness.    Rectal exam: No external  hemorrhoids, 1 superficial tear in anal canal, digital rectal exam no masses felt in the rectal vault.  No bright red blood or melena noted. Msk:  Symmetrical without gross deformities. Good, equal movement & strength bilaterally. Pulses:  Normal pulses noted. Extremities:  No clubbing or edema.  No cyanosis. Neurologic:  Alert and oriented x3;  grossly normal neurologically. Skin:  Intact without significant lesions or rashes. No jaundice. Lymph Nodes:  No significant cervical adenopathy. Psych:  Alert and cooperative. Normal mood and affect.   Labs: CBC    Component Value Date/Time   WBC 5.9 03/06/2014 0014   RBC 4.21 (L) 03/06/2014 0014   HGB 14.0 03/06/2014 0014   HCT 40.8 03/06/2014 0014   PLT 209 03/06/2014 0014   MCV 97 03/06/2014 0014   MCH 33.2 03/06/2014 0014   MCHC 34.2 03/06/2014 0014   RDW 13.3 03/06/2014 0014   LYMPHSABS 1.8 03/06/2014 0014   MONOABS 0.6 03/06/2014 0014   EOSABS 0.3 03/06/2014 0014   BASOSABS 0.0 03/06/2014 0014   CMP     Component Value Date/Time   NA 139 03/06/2014 0014   K 3.7 03/06/2014 0014   CL 107 03/06/2014 0014   CO2 27 03/06/2014 0014   GLUCOSE 111 (H) 03/06/2014 0014   BUN 9 03/06/2014 0014   CREATININE 0.84 03/06/2014 0014   CALCIUM 8.3 (L) 03/06/2014 0014   GFRNONAA >60 03/06/2014 0014   GFRAA >60 03/06/2014 0014    Imaging Studies: No results found.  Assessment and Plan:   Dillon [name] is a 62 y.o. y/o male has been referred for intermittent bright red blood per rectum and rectal pain with bowel movements  The etiology of his intermittent blood per rectum is his known internal hemorrhoids seen on recent colonoscopy  He also reports rectal pain and his rectal exam shows very superficial tear in his anal canal.  This is likely due to constipation.  He was asked not to wear tight clothing use wet wipes to clean and not irritate the area avoid scratching etc.  Patient is still straining with his bowel movements and  therefore a high-fiber diet is not adequately allowing him to have soft bowel movements  I have asked him to start taking MiraLAX everyday in the morning and then after 4 to 5 days increase to 2 doses every morning if he does not have 1-2 soft bowel movements daily without straining  I will also prescribe Anusol suppositories for his hemorrhoids  If the above measures do not lead to symptom improvement have asked him to contact us and we will set him up for internal hemorrhoid banding with Dr. Marius Ditch  We will follow him in clinic closely to reassess symptoms with above measures  Dr Dillon [name]  Speech recognition software was used to dictate the above note.

## 2018-10-21 ENCOUNTER — Ambulatory Visit: Payer: Managed Care, Other (non HMO) | Admitting: Gastroenterology

## 2018-10-21 ENCOUNTER — Encounter

## 2018-10-28 ENCOUNTER — Ambulatory Visit (INDEPENDENT_AMBULATORY_CARE_PROVIDER_SITE_OTHER): Payer: Managed Care, Other (non HMO) | Admitting: Gastroenterology

## 2018-10-28 DIAGNOSIS — K648 Other hemorrhoids: Secondary | ICD-10-CM | POA: Diagnosis not present

## 2018-10-28 NOTE — Progress Notes (Signed)
Vonda Antigua, MD [address]  [place]  [place], [place] [address]  Main: [phone]  Fax: [phone]   Primary Care Physician: [name], MD  Chief complaint: Follow-up for hemorrhoids and intermittent bright red blood per rectum  HPI: Dillon [name] is a 63 y.o. male here for follow-up of blood per rectum due to internal hemorrhoids.  1 previous visit on September 22, 2018 he prescribed Anusol suppositories and asked him to take MiraLAX daily. He reports symptoms symptoms have completely resolved.  No further rectal pain, no further bright red blood per rectum.  Is also taking MiraLAX but has decrease it to every other day as he states his bowel movements were not loose, but he had to run to the bathroom quickly when he was having a bowel movement.  Now has soft stool without having to strain.  Has also started eating oatmeal and yogurt.  Patient underwent screening colonoscopy in May 2019 at that time had also mentioned about his of bright red blood per rectum.  Colonoscopy showed 4 polyps 2 to 3 mm in size and were removed.  [place] were reported.  Mild rectal erythema was also reported.  Nonbleeding internal hemorrhoids were seen.  Pathology showed sessile serrated adenoma and tubular adenoma polyps.  Rectal erythema showed focal lymphoid aggregate.  Current Outpatient Medications  Medication Sig Dispense Refill  . atorvastatin (LIPITOR) 40 MG tablet Take 40 mg by mouth daily.  8  . clopidogrel (PLAVIX) 75 MG tablet Take 75 mg by mouth every other day. Taking 1/2 tabled every other day    . nitroGLYCERIN (NITROSTAT) 0.3 MG SL tablet Place 0.3 mg under the tongue every 5 (five) minutes as needed for chest pain.    . polyethylene glycol (MIRALAX / GLYCOLAX) packet Take 17 g by mouth daily.     No current facility-administered medications for this visit.     Allergies as of 10/28/2018 - Review Complete 09/22/2018  Allergen Reaction Noted  . Ace inhibitors  Swelling 10/28/2017    ROS:  General: Negative for anorexia, weight loss, fever, chills, fatigue, weakness. ENT: Negative for hoarseness, difficulty swallowing , nasal congestion. CV: Negative for chest pain, angina, palpitations, dyspnea on exertion, peripheral edema.  Respiratory: Negative for dyspnea at rest, dyspnea on exertion, cough, sputum, wheezing.  GI: See history of present illness. GU:  Negative for dysuria, hematuria, urinary incontinence, urinary frequency, nocturnal urination.  Endo: Negative for unusual weight change.    Physical Examination: General: Well-nourished, well-developed in no acute distress.  Eyes: No icterus. Conjunctivae pink. Mouth: Oropharyngeal mucosa moist and pink , no lesions erythema or exudate. Neck: Supple, Trachea midline Abdomen: Bowel sounds are normal, nontender, nondistended, no hepatosplenomegaly or masses, no abdominal bruits or hernia , no rebound or guarding.   Extremities: No lower extremity edema. No clubbing or deformities. Neuro: Alert and oriented x 3.  Grossly intact. Skin: Warm and dry, no jaundice.   Psych: Alert and cooperative, normal mood and affect.   Labs: CMP     Component Value Date/Time   NA 139 03/06/2014 0014   K 3.7 03/06/2014 0014   CL 107 03/06/2014 0014   CO2 27 03/06/2014 0014   GLUCOSE 111 (H) 03/06/2014 0014   BUN 9 03/06/2014 0014   CREATININE 0.84 03/06/2014 0014   CALCIUM 8.3 (L) 03/06/2014 0014   GFRNONAA >60 03/06/2014 0014   GFRAA >60 03/06/2014 0014   Lab Results  Component Value Date   WBC 5.9 03/06/2014  HGB 14.0 03/06/2014   HCT 40.8 03/06/2014   MCV 97 03/06/2014   PLT 209 03/06/2014    Imaging Studies: No results found.  Assessment and Plan:   Dillon [name] is a 63 y.o. y/o male here for follow-up of history of bright red blood per rectum attributed to internal hemorrhoids  Symptoms completely resolved with high-fiber diet and course of Anusol suppository that he has  completed  High-fiber diet  since he is eating more fiber at this time I have discussed with him that as his bowel movements continue to get better, he can decrease MiraLAX dose and completely stop it if his high-fiber diet allows him to have 1-2 soft bowel movements daily or every other day without straining.  If symptoms recur he I have asked him to call us and he verbalized understanding.  Colonoscopy up-to-date, repeat colonoscopy recommended in 3 years from last procedure due to history of colon polyps.  This was discussed with him as well.    Dr Vonda Antigua

## 2019-06-16 NOTE — Addendum Note (Signed)
Addended by: Laverle Hobby on: 06/16/2019 12:09 PM   Modules accepted: Orders

## 2019-07-21 ENCOUNTER — Other Ambulatory Visit: Payer: Self-pay | Admitting: Pulmonary Disease

## 2019-07-21 DIAGNOSIS — J849 Interstitial pulmonary disease, unspecified: Secondary | ICD-10-CM

## 2019-08-25 ENCOUNTER — Ambulatory Visit
Admission: RE | Admit: 2019-08-25 | Discharge: 2019-08-25 | Disposition: A | Discharge status: Home / Self Care | Payer: 59 | Source: Ambulatory Visit | Attending: Internal Medicine | Admitting: Internal Medicine

## 2019-08-25 ENCOUNTER — Other Ambulatory Visit: Payer: Self-pay | Admitting: Internal Medicine

## 2019-08-25 ENCOUNTER — Other Ambulatory Visit: Payer: Self-pay

## 2019-08-25 DIAGNOSIS — R0902 Hypoxemia: Secondary | ICD-10-CM | POA: Insufficient documentation

## 2019-08-25 DIAGNOSIS — R0602 Shortness of breath: Secondary | ICD-10-CM | POA: Insufficient documentation

## 2019-09-02 ENCOUNTER — Other Ambulatory Visit: Payer: Self-pay | Admitting: Internal Medicine

## 2019-09-02 DIAGNOSIS — J849 Interstitial pulmonary disease, unspecified: Secondary | ICD-10-CM

## 2019-09-23 ENCOUNTER — Other Ambulatory Visit: Payer: Self-pay

## 2019-09-23 DIAGNOSIS — Z20822 Contact with and (suspected) exposure to covid-19: Secondary | ICD-10-CM

## 2019-09-24 LAB — NOVEL CORONAVIRUS, NAA: SARS-CoV-2, NAA: DETECTED — AB

## 2019-09-30 ENCOUNTER — Inpatient Hospital Stay
Admission: EM | Admit: 2019-09-30 | Discharge: 2019-10-05 | DRG: 246 | Disposition: A | Discharge status: Home / Self Care | Payer: 59 | Attending: Internal Medicine | Admitting: Internal Medicine

## 2019-09-30 ENCOUNTER — Other Ambulatory Visit: Payer: Self-pay

## 2019-09-30 ENCOUNTER — Encounter: Payer: Self-pay | Admitting: Emergency Medicine

## 2019-09-30 ENCOUNTER — Encounter
Admission: EM | Disposition: A | Discharge status: Home / Self Care | Payer: Self-pay | Source: Home / Self Care | Attending: Internal Medicine

## 2019-09-30 DIAGNOSIS — Z79899 Other long term (current) drug therapy: Secondary | ICD-10-CM | POA: Diagnosis not present

## 2019-09-30 DIAGNOSIS — Z7902 Long term (current) use of antithrombotics/antiplatelets: Secondary | ICD-10-CM | POA: Diagnosis not present

## 2019-09-30 DIAGNOSIS — I2109 ST elevation (STEMI) myocardial infarction involving other coronary artery of anterior wall: Secondary | ICD-10-CM | POA: Diagnosis present

## 2019-09-30 DIAGNOSIS — Z87891 Personal history of nicotine dependence: Secondary | ICD-10-CM

## 2019-09-30 DIAGNOSIS — I2102 ST elevation (STEMI) myocardial infarction involving left anterior descending coronary artery: Secondary | ICD-10-CM | POA: Diagnosis not present

## 2019-09-30 DIAGNOSIS — Z23 Encounter for immunization: Secondary | ICD-10-CM

## 2019-09-30 DIAGNOSIS — J849 Interstitial pulmonary disease, unspecified: Secondary | ICD-10-CM | POA: Diagnosis present

## 2019-09-30 DIAGNOSIS — R0902 Hypoxemia: Secondary | ICD-10-CM | POA: Diagnosis present

## 2019-09-30 DIAGNOSIS — I213 ST elevation (STEMI) myocardial infarction of unspecified site: Secondary | ICD-10-CM | POA: Diagnosis present

## 2019-09-30 DIAGNOSIS — I2583 Coronary atherosclerosis due to lipid rich plaque: Secondary | ICD-10-CM | POA: Diagnosis not present

## 2019-09-30 DIAGNOSIS — G2581 Restless legs syndrome: Secondary | ICD-10-CM | POA: Diagnosis present

## 2019-09-30 DIAGNOSIS — I2510 Atherosclerotic heart disease of native coronary artery without angina pectoris: Secondary | ICD-10-CM | POA: Diagnosis present

## 2019-09-30 DIAGNOSIS — E7800 Pure hypercholesterolemia, unspecified: Secondary | ICD-10-CM | POA: Diagnosis present

## 2019-09-30 DIAGNOSIS — Z955 Presence of coronary angioplasty implant and graft: Secondary | ICD-10-CM

## 2019-09-30 DIAGNOSIS — U071 COVID-19: Secondary | ICD-10-CM

## 2019-09-30 DIAGNOSIS — Z888 Allergy status to other drugs, medicaments and biological substances status: Secondary | ICD-10-CM | POA: Diagnosis not present

## 2019-09-30 DIAGNOSIS — E785 Hyperlipidemia, unspecified: Secondary | ICD-10-CM | POA: Diagnosis present

## 2019-09-30 HISTORY — PX: CORONARY/GRAFT ACUTE MI REVASCULARIZATION: CATH118305

## 2019-09-30 HISTORY — PX: LEFT HEART CATH AND CORONARY ANGIOGRAPHY: CATH118249

## 2019-09-30 LAB — BASIC METABOLIC PANEL
Anion gap: 10 (ref 5–15)
BUN: 13 mg/dL (ref 8–23)
CO2: 22 mmol/L (ref 22–32)
Calcium: 8.3 mg/dL — ABNORMAL LOW (ref 8.9–10.3)
Chloride: 102 mmol/L (ref 98–111)
Creatinine, Ser: 0.76 mg/dL (ref 0.61–1.24)
GFR calc Af Amer: >60 mL/min (ref >60)
GFR calc non Af Amer: >60 mL/min (ref >60)
Glucose, Bld: 107 mg/dL — ABNORMAL HIGH (ref 70–99)
Potassium: 4 mmol/L (ref 3.5–5.1)
Sodium: 134 mmol/L — ABNORMAL LOW (ref 135–145)

## 2019-09-30 LAB — CBC WITH DIFFERENTIAL/PLATELET
Abs Immature Granulocytes: 0.06 10*3/uL (ref 0.00–0.07)
Basophils Absolute: 0 10*3/uL (ref 0.0–0.1)
Basophils Relative: 0 %
Eosinophils Absolute: 0.2 10*3/uL (ref 0.0–0.5)
Eosinophils Relative: 3 %
HCT: 40.9 % (ref 39.0–52.0)
Hemoglobin: 14 g/dL (ref 13.0–17.0)
Immature Granulocytes: 1 %
Lymphocytes Relative: 15 %
Lymphs Abs: 1.3 10*3/uL (ref 0.7–4.0)
MCH: 31.4 pg (ref 26.0–34.0)
MCHC: 34.2 g/dL (ref 30.0–36.0)
MCV: 91.7 fL (ref 80.0–100.0)
Monocytes Absolute: 0.8 10*3/uL (ref 0.1–1.0)
Monocytes Relative: 9 %
Neutro Abs: 6.3 10*3/uL (ref 1.7–7.7)
Neutrophils Relative %: 72 %
Platelets: 211 10*3/uL (ref 150–400)
RBC: 4.46 MIL/uL (ref 4.22–5.81)
RDW: 13.2 % (ref 11.5–15.5)
WBC: 8.6 10*3/uL (ref 4.0–10.5)
nRBC: 0 % (ref 0.0–0.2)

## 2019-09-30 LAB — GLUCOSE, CAPILLARY: Glucose-Capillary: 102 mg/dL — ABNORMAL HIGH (ref 70–99)

## 2019-09-30 LAB — POCT ACTIVATED CLOTTING TIME
Activated Clotting Time: 246 seconds
Activated Clotting Time: 329 seconds

## 2019-09-30 LAB — MRSA PCR SCREENING: MRSA by PCR: NEGATIVE

## 2019-09-30 LAB — TROPONIN I (HIGH SENSITIVITY)
Troponin I (High Sensitivity): 21 ng/L — ABNORMAL HIGH (ref <18)
Troponin I (High Sensitivity): 1472 ng/L (ref <18)
Troponin I (High Sensitivity): 4656 ng/L (ref <18)

## 2019-09-30 LAB — CARDIAC CATHETERIZATION: Cath EF Quantitative: 55 %

## 2019-09-30 SURGERY — CORONARY/GRAFT ACUTE MI REVASCULARIZATION
Anesthesia: Moderate Sedation

## 2019-09-30 MED ORDER — SODIUM CHLORIDE 0.9% FLUSH: 3.0000 mL | INTRAVENOUS | Status: DC | PRN

## 2019-09-30 MED ORDER — HEPARIN SODIUM (PORCINE) 1000 UNIT/ML IJ SOLN
4000.0000 [IU] | Freq: Once | INTRAMUSCULAR | Status: AC
  Administered 2019-09-30: 4000 [IU] via INTRAVENOUS

## 2019-09-30 MED ORDER — TICAGRELOR 90 MG PO TABS
ORAL_TABLET | ORAL | Status: DC | PRN
  Administered 2019-09-30: 180 mg via ORAL

## 2019-09-30 MED ORDER — TICAGRELOR 90 MG PO TABS
ORAL_TABLET | ORAL | Status: AC
  Filled 2019-09-30: qty 2

## 2019-09-30 MED ORDER — VERAPAMIL HCL 2.5 MG/ML IV SOLN
INTRAVENOUS | Status: DC | PRN
  Administered 2019-09-30: 2.5 mg via INTRA_ARTERIAL

## 2019-09-30 MED ORDER — ONDANSETRON HCL 4 MG/2ML IJ SOLN: 4.0000 mg | Freq: Four times a day (QID) | INTRAMUSCULAR | Status: DC | PRN

## 2019-09-30 MED ORDER — NITROGLYCERIN 1 MG/10 ML FOR IR/CATH LAB
INTRA_ARTERIAL | Status: DC | PRN
  Administered 2019-09-30: 200 ug via INTRACORONARY

## 2019-09-30 MED ORDER — SODIUM CHLORIDE 0.9 % WEIGHT BASED INFUSION
1.0000 mL/kg/h | INTRAVENOUS | Status: AC
  Administered 2019-09-30: 1 mL/kg/h via INTRAVENOUS

## 2019-09-30 MED ORDER — ZINC SULFATE 220 (50 ZN) MG PO CAPS
220.0000 mg | ORAL_CAPSULE | Freq: Every day | ORAL | Status: DC
  Administered 2019-09-30 – 2019-10-05 (×6): 220 mg via ORAL
  Filled 2019-09-30 (×6): qty 1

## 2019-09-30 MED ORDER — NITROGLYCERIN 1 MG/10 ML FOR IR/CATH LAB
INTRA_ARTERIAL | Status: AC
  Filled 2019-09-30: qty 10

## 2019-09-30 MED ORDER — SODIUM CHLORIDE 0.9 % IV SOLN
250.0000 mL | INTRAVENOUS | Status: DC | PRN
  Administered 2019-10-02 – 2019-10-05 (×3): 250 mL via INTRAVENOUS

## 2019-09-30 MED ORDER — FENTANYL CITRATE (PF) 100 MCG/2ML IJ SOLN
INTRAMUSCULAR | Status: AC
  Filled 2019-09-30: qty 2

## 2019-09-30 MED ORDER — MIDAZOLAM HCL 2 MG/2ML IJ SOLN
INTRAMUSCULAR | Status: AC
  Filled 2019-09-30: qty 2

## 2019-09-30 MED ORDER — ONDANSETRON HCL 4 MG/2ML IJ SOLN
INTRAMUSCULAR | Status: AC
  Filled 2019-09-30: qty 2

## 2019-09-30 MED ORDER — PNEUMOCOCCAL VAC POLYVALENT 25 MCG/0.5ML IJ INJ
0.5000 mL | INJECTION | INTRAMUSCULAR | Status: AC
  Administered 2019-10-03: 09:00:00 0.5 mL via INTRAMUSCULAR
  Filled 2019-09-30 (×2): qty 0.5

## 2019-09-30 MED ORDER — CARVEDILOL 6.25 MG PO TABS
3.1250 mg | ORAL_TABLET | Freq: Two times a day (BID) | ORAL | Status: DC
  Administered 2019-09-30 – 2019-10-04 (×8): 3.125 mg via ORAL
  Filled 2019-09-30 (×10): qty 1

## 2019-09-30 MED ORDER — ASCORBIC ACID 500 MG PO TABS
500.0000 mg | ORAL_TABLET | Freq: Every day | ORAL | Status: DC
  Administered 2019-09-30 – 2019-10-05 (×6): 500 mg via ORAL
  Filled 2019-09-30 (×6): qty 1

## 2019-09-30 MED ORDER — SODIUM CHLORIDE 0.9% FLUSH
3.0000 mL | Freq: Two times a day (BID) | INTRAVENOUS | Status: DC
  Administered 2019-10-01 – 2019-10-05 (×9): 3 mL via INTRAVENOUS

## 2019-09-30 MED ORDER — ATORVASTATIN CALCIUM 20 MG PO TABS
40.0000 mg | ORAL_TABLET | Freq: Every day | ORAL | Status: DC
  Administered 2019-09-30 – 2019-10-05 (×6): 40 mg via ORAL
  Filled 2019-09-30 (×6): qty 2

## 2019-09-30 MED ORDER — TICAGRELOR 90 MG PO TABS
90.0000 mg | ORAL_TABLET | Freq: Two times a day (BID) | ORAL | Status: DC
  Administered 2019-10-01 – 2019-10-05 (×9): 90 mg via ORAL
  Filled 2019-09-30 (×10): qty 1

## 2019-09-30 MED ORDER — VERAPAMIL HCL 2.5 MG/ML IV SOLN
INTRAVENOUS | Status: AC
  Filled 2019-09-30: qty 2

## 2019-09-30 MED ORDER — IPRATROPIUM-ALBUTEROL 20-100 MCG/ACT IN AERS
1.0000 | INHALATION_SPRAY | Freq: Four times a day (QID) | RESPIRATORY_TRACT | Status: DC
  Filled 2019-09-30 (×3): qty 4

## 2019-09-30 MED ORDER — HEPARIN SODIUM (PORCINE) 1000 UNIT/ML IJ SOLN
INTRAMUSCULAR | Status: AC
  Filled 2019-09-30: qty 1

## 2019-09-30 MED ORDER — HEPARIN (PORCINE) IN NACL 1000-0.9 UT/500ML-% IV SOLN
INTRAVENOUS | Status: AC
  Filled 2019-09-30: qty 1000

## 2019-09-30 MED ORDER — HEPARIN SODIUM (PORCINE) 1000 UNIT/ML IJ SOLN
INTRAMUSCULAR | Status: DC | PRN
  Administered 2019-09-30: 3000 [IU] via INTRAVENOUS
  Administered 2019-09-30: 5000 [IU] via INTRAVENOUS

## 2019-09-30 MED ORDER — HEPARIN (PORCINE) IN NACL 2000-0.9 UNIT/L-% IV SOLN
INTRAVENOUS | Status: DC | PRN
  Administered 2019-09-30: 1000 mL

## 2019-09-30 MED ORDER — ENOXAPARIN SODIUM 40 MG/0.4ML ~~LOC~~ SOLN
40.0000 mg | SUBCUTANEOUS | Status: DC
  Administered 2019-10-01 – 2019-10-05 (×5): 40 mg via SUBCUTANEOUS
  Filled 2019-09-30 (×5): qty 0.4

## 2019-09-30 MED ORDER — IOHEXOL 300 MG/ML  SOLN
INTRAMUSCULAR | Status: DC | PRN
  Administered 2019-09-30: 165 mL

## 2019-09-30 MED ORDER — ACETAMINOPHEN 325 MG PO TABS: 650.0000 mg | ORAL_TABLET | ORAL | Status: DC | PRN

## 2019-09-30 MED ORDER — ORAL CARE MOUTH RINSE
15.0000 mL | Freq: Two times a day (BID) | OROMUCOSAL | Status: DC
  Administered 2019-10-01 – 2019-10-05 (×6): 15 mL via OROMUCOSAL

## 2019-09-30 MED ORDER — ASPIRIN 81 MG PO CHEW
81.0000 mg | CHEWABLE_TABLET | Freq: Every day | ORAL | Status: DC
  Administered 2019-09-30 – 2019-10-05 (×6): 81 mg via ORAL
  Filled 2019-09-30 (×6): qty 1

## 2019-09-30 SURGICAL SUPPLY — 14 items
BALLN ~~LOC~~ TREK RX 3.75X8 (BALLOONS) ×3
BALLOON ~~LOC~~ TREK RX 3.75X8 (BALLOONS) ×1 IMPLANT
CATH INFINITI 5FR JK (CATHETERS) ×3 IMPLANT
CATH LAUNCHER 6FR EBU3.5 (CATHETERS) ×3 IMPLANT
DEVICE INFLAT 30 PLUS (MISCELLANEOUS) ×3 IMPLANT
DEVICE RAD TR BAND REGULAR (VASCULAR PRODUCTS) ×3 IMPLANT
GLIDESHEATH SLEND SS 6F .021 (SHEATH) ×3 IMPLANT
GUIDEWIRE .025 260CM (WIRE) ×3 IMPLANT
KIT MANI 3VAL PERCEP (MISCELLANEOUS) ×3 IMPLANT
PACK CARDIAC CATH (CUSTOM PROCEDURE TRAY) ×3 IMPLANT
STENT RESOLUTE ONYX 3.5X12 (Permanent Stent) ×3 IMPLANT
WIRE HITORQ VERSACORE ST 145CM (WIRE) ×3 IMPLANT
WIRE ROSEN-J .035X260CM (WIRE) ×3 IMPLANT
WIRE RUNTHROUGH .014X180CM (WIRE) ×3 IMPLANT

## 2019-09-30 NOTE — ED Provider Notes (Signed)
[facility] Emergency Department Provider Note ____________________________________________   First MD Initiated Contact with Patient 09/30/19 1436     (approximate)  I have reviewed the triage vital signs and the nursing notes.   HISTORY  Chief Complaint Code STEMI    HPI Dillon [name] is a 63 y.o. male with PMH as noted below including CAD with prior stenting in 2010 who presents with chest pain, acute onset around 1 PM, and improved after he was given 2 sprays of nitroglycerin by EMS.  The patient denies associated shortness of breath.  He was diagnosed with COVID-19 and has had some cough, subjective fevers, and body aches since then.  Past Medical History:  Diagnosis Date  . Coronary artery disease   . Elevated cholesterol   . Restless leg syndrome     Patient Active Problem List   Diagnosis Date Noted  . Acute ST elevation myocardial infarction (STEMI) of anterior wall ([place]) 09/30/2019  . Encounter for screening colonoscopy   . Rectal polyp   . Benign neoplasm of ascending colon   . Internal hemorrhoids   . Proctitis   . Diverticulosis of large intestine without diverticulitis     Past Surgical History:  Procedure Laterality Date  . ANGIOPLASTY  2010   2 stents placed  . BACK SURGERY    . COLONOSCOPY WITH PROPOFOL N/A 03/05/2018   Procedure: COLONOSCOPY WITH PROPOFOL;  Surgeon: Virgel Manifold, MD;  Location: [facility] ENDOSCOPY;  Service: Endoscopy;  Laterality: N/A;  . CORONARY ANGIOPLASTY      Prior to Admission medications   Medication Sig Start Date End Date Taking? Authorizing Provider  atorvastatin (LIPITOR) 40 MG tablet Take 40 mg by mouth daily. 06/10/18   [provider]  clopidogrel (PLAVIX) 75 MG tablet Take 75 mg by mouth every other day. Taking 1/2 tabled every other day    [provider]  nitroGLYCERIN (NITROSTAT) 0.3 MG SL tablet Place 0.3 mg under the tongue every 5 (five) minutes as needed for chest  pain.    [provider]  polyethylene glycol (MIRALAX / GLYCOLAX) packet Take 17 g by mouth daily.    Virgel Manifold, MD    Allergies Ace inhibitors  History reviewed. No pertinent family history.  Social History Social History   Tobacco Use  . Smoking status: Former Smoker    Quit date: 03/05/2009    Years since quitting: 10.5  . Smokeless tobacco: Never Used  Substance Use Topics  . Alcohol use: Yes  . Drug use: Not Currently    Review of Systems  Constitutional: Positive for fever. Eyes: No redness. ENT: No sore throat. Cardiovascular: Positive for chest pain. Respiratory: Positive for mild shortness of breath. Gastrointestinal: No vomiting or diarrhea.  Genitourinary: Negative for dysuria.  Musculoskeletal: Negative for back pain. Skin: Negative for rash. Neurological: Negative for headache.   ____________________________________________   PHYSICAL EXAM:  VITAL SIGNS: ED Triage Vitals  Enc Vitals Group     BP 09/30/19 1443 (!) 159/95     Pulse Rate 09/30/19 1430 86     Resp 09/30/19 1430 (!) 27     Temp --      Temp src --      SpO2 09/30/19 1443 96 %     Weight 09/30/19 1436 205 lb (93 kg)     Height 09/30/19 1436 '6\' 2"'  (1.88 m)     Head Circumference --      Peak Flow --  Pain Score 09/30/19 1431 0     Pain Loc --      Pain Edu? --      Excl. in [place]? --     Constitutional: Alert and oriented.  Relatively comfortable appearing and in no acute distress. Eyes: Conjunctivae are normal.  Head: Atraumatic. Nose: No congestion/rhinnorhea. Mouth/Throat: Mucous membranes are moist.   Neck: Normal range of motion.  Cardiovascular: Normal rate, regular rhythm. Grossly normal heart sounds.  Good peripheral circulation. Respiratory: Normal respiratory effort.  No retractions. Lungs CTAB. Gastrointestinal:  No distention.  Musculoskeletal: No lower extremity edema.  Extremities warm and well perfused.  Neurologic:  Normal speech and  language. No gross focal neurologic deficits are appreciated.  Skin:  Skin is warm and dry. No rash noted. Psychiatric: Mood and affect are normal. Speech and behavior are normal.  ____________________________________________   LABS (all labs ordered are listed, but only abnormal results are displayed)  Labs Reviewed  BASIC METABOLIC PANEL - Abnormal; Notable for the following components:      Result Value   Sodium 134 (*)    Glucose, Bld 107 (*)    Calcium 8.3 (*)    All other components within normal limits  TROPONIN I (HIGH SENSITIVITY) - Abnormal; Notable for the following components:   Troponin I (High Sensitivity) 21 (*)    All other components within normal limits  CBC WITH DIFFERENTIAL/PLATELET  TROPONIN I (HIGH SENSITIVITY)   ____________________________________________  EKG  ED ECG REPORT I, Arta Silence, the attending physician, personally viewed and interpreted this ECG.  Date: 09/30/2019 EKG Time: 1428 Rate: 87 Rhythm: normal sinus rhythm QRS Axis: normal Intervals: normal ST/T Wave abnormalities: Inferior ST elevations with anterior reciprocal changes Narrative Interpretation: Findings consistent with ST elevation MI  ____________________________________________  RADIOLOGY    ____________________________________________   PROCEDURES  Procedure(s) performed: No  Procedures  Critical Care performed: Yes  CRITICAL CARE Performed by: Arta Silence   Total critical care time: 20 minutes  Critical care time was exclusive of separately billable procedures and treating other patients.  Critical care was necessary to treat or prevent imminent or life-threatening deterioration.  Critical care was time spent personally by me on the following activities: development of treatment plan with patient and/or surrogate as well as nursing, discussions with consultants, evaluation of patient's response to treatment, examination of patient,  obtaining history from patient or surrogate, ordering and performing treatments and interventions, ordering and review of laboratory studies, ordering and review of radiographic studies, pulse oximetry and re-evaluation of patient's condition. ____________________________________________   INITIAL IMPRESSION / ASSESSMENT AND PLAN / ED COURSE  Pertinent labs & imaging results that were available during my care of the patient were reviewed by me and considered in my medical decision making (see chart for details).  63 year old male with PMH as noted above including a past history of CAD with cardiac catheterization and stenting in 2010 who presents with acute onset of chest pain.  He was given aspirin and nitroglycerin by EMS with improvement in the pain.  The patient was also recently diagnosed with COVID-19, and reports fever and body aches but no significant shortness of breath.  On exam, the patient is relatively well-appearing.  Per EMS his O2 saturation was in the high 80s on room air at home, and now he is in the high 90s on nasal cannula.  He is slightly hypertensive with otherwise normal vital signs.  The remainder of the physical exam is unremarkable.  I reviewed the past  medical records in [place] including an EKG from 2015.  The patient has new inferior ST elevations when compared to this EKG.  Dr. Fletcher Anon from cardiology responded to the STEMI activation and met me in the patient in the ED.  He recommends taking the patient to the Cath Lab for cardiac catheterization.  ________________________________  Dillon [name] was evaluated in Emergency Department on 09/30/2019 for the symptoms described in the history of present illness. He was evaluated in the context of the global COVID-19 pandemic, which necessitated consideration that the patient might be at risk for infection with the SARS-CoV-2 virus that causes COVID-19. Institutional protocols and algorithms that pertain to the evaluation of  patients at risk for COVID-19 are in a state of rapid change based on information released by regulatory bodies including the CDC and federal and state organizations. These policies and algorithms were followed during the patient's care in the ED.  ____________________________________________   FINAL CLINICAL IMPRESSION(S) / ED DIAGNOSES  Final diagnoses:  ST elevation myocardial infarction (STEMI), unspecified artery ([place])      NEW MEDICATIONS STARTED DURING THIS VISIT:  Current Discharge Medication List       Note:  This document was prepared using Dragon voice recognition software and may include unintentional dictation errors.    Arta Silence, MD 09/30/19 [phone]

## 2019-09-30 NOTE — ED Triage Notes (Addendum)
Pt via EMS from home. Pt stated that he had CP since 1330 today, pt describes as pressure. Per EMS, inferior and anterior elevation on 12lead. Pt was given 324 ASA and 2 SL Nitro. Pt states pt has decreased since then.  Pt is COVID+

## 2019-09-30 NOTE — Progress Notes (Signed)
Shift summary:  - Patient admitted to ICU s/p DES to LAD s/p STEMI.  - Covid (+), on 2L [place]. NAD.

## 2019-09-30 NOTE — H&P (Signed)
History and Physical    Dillon [name] [id number] DOB: 1956-06-09 DOA: 09/30/2019  I have briefly reviewed the patient's prior medical records in [place]  PCP: [name], MD  Patient coming from: Home  Chief Complaint: Chest pain  HPI: Dillon [name] is a 63 y.o. male with medical history significant of CAD, hyperlipidemia, recently diagnosed ILD, presents to the hospital with chief complaint of chest pain.  Patient has been having fatigue, some fevers over the last 10 days and has been diagnosed with Covid last week.  He has been feeling okay and his fever has been subsiding, this morning he was actually feeling quite well and all of a sudden experienced substernal pressure-like chest pain.  He came to the emergency room, was diagnosed with a STEMI and taken straight to the Cath Lab.  Cardiac catheterization showed occluded apical LAD 80%, status post successful DES placement.  I evaluated patient post-cath in the ICU.  He tells me that overall he is feeling a lot better.  He has been experiencing chronic cough and chronic shortness of breath for the past year, has been evaluated by pulmonology as an outpatient and recently was told that he probably has interstitial lung disease.  This was before Covid.  Patient has a pulse ox at home and normally he sees his O2 sats anywhere between 85 and 88% and sometimes higher.  After getting diagnosed with Covid he has not experienced any changes in his baseline shortness of breath or changes in his chronic cough.  He feels about the same overall.  No GI symptoms.  Review of Systems: All systems reviewed, and apart from HPI, all negative  Past Medical History:  Diagnosis Date  . Coronary artery disease   . Elevated cholesterol   . Restless leg syndrome     Past Surgical History:  Procedure Laterality Date  . ANGIOPLASTY  2010   2 stents placed  . BACK SURGERY    . COLONOSCOPY WITH PROPOFOL N/A 03/05/2018   Procedure: COLONOSCOPY  WITH PROPOFOL;  Surgeon: Virgel Manifold, MD;  Location: [facility] ENDOSCOPY;  Service: Endoscopy;  Laterality: N/A;  . CORONARY ANGIOPLASTY       reports that he quit smoking about 10 years ago. He has never used smokeless tobacco. He reports current alcohol use. He reports previous drug use.  Allergies  Allergen Reactions  . Ace Inhibitors Swelling    History reviewed. No pertinent family history.  Prior to Admission medications   Medication Sig Start Date End Date Taking? Authorizing Provider  atorvastatin (LIPITOR) 40 MG tablet Take 40 mg by mouth daily. 06/10/18   [provider]  clopidogrel (PLAVIX) 75 MG tablet Take 75 mg by mouth every other day. Taking 1/2 tabled every other day    [provider]  nitroGLYCERIN (NITROSTAT) 0.3 MG SL tablet Place 0.3 mg under the tongue every 5 (five) minutes as needed for chest pain.    [provider]  polyethylene glycol (MIRALAX / GLYCOLAX) packet Take 17 g by mouth daily.    Virgel Manifold, MD    Physical Exam: Vitals:   09/30/19 1443 09/30/19 1620 09/30/19 1630 09/30/19 1645  BP: (!) 159/95 125/67 123/84 123/83  Pulse:  75 77 71  Resp:  15 18 (!) 25  Temp:  98.7 F (37.1 C)    TempSrc:  Axillary    SpO2: 96% 98% 95% 97%  Weight:      Height:  Constitutional: NAD, calm, comfortable Eyes: PERRL, lids and conjunctivae normal ENMT: Mucous membranes are moist.  Neck: normal, supple Respiratory: clear to auscultation bilaterally, no wheezing, no crackles. Normal respiratory effort. No accessory muscle use.  Cardiovascular: Regular rate and rhythm, no murmurs / rubs / gallops. No extremity edema. 2+ pedal pulses.  Abdomen: no tenderness, no masses palpated. Bowel sounds positive.  Musculoskeletal: no clubbing / cyanosis. Normal muscle tone.  Skin: no rashes Neurologic: CN 2-12 grossly intact. Strength 5/5 in all 4.  Psychiatric: Normal judgment and insight. Alert and oriented x 3. Normal  mood.   Labs on Admission: I have personally reviewed following labs and imaging studies  CBC: Recent Labs  Lab 09/30/19 1437  WBC 8.6  NEUTROABS 6.3  HGB 14.0  HCT 40.9  MCV 91.7  PLT 123456   Basic Metabolic Panel: Recent Labs  Lab 09/30/19 1437  NA 134*  K 4.0  CL 102  CO2 22  GLUCOSE 107*  BUN 13  CREATININE 0.76  CALCIUM 8.3*   Liver Function Tests: No results for input(s): AST, ALT, ALKPHOS, BILITOT, PROT, ALBUMIN in the last 168 hours. Coagulation Profile: No results for input(s): INR, PROTIME in the last 168 hours. BNP (last 3 results) No results for input(s): PROBNP in the last 8760 hours. CBG: Recent Labs  Lab 09/30/19 1634  GLUCAP 102*   Thyroid Function Tests: No results for input(s): TSH, T4TOTAL, FREET4, T3FREE, THYROIDAB in the last 72 hours. Urine analysis:    Component Value Date/Time   COLORURINE Yellow 03/06/2014 0058   APPEARANCEUR Clear 03/06/2014 0058   LABSPEC 1.025 03/06/2014 0058   PHURINE 5.0 03/06/2014 0058   GLUCOSEU Negative 03/06/2014 0058   HGBUR Negative 03/06/2014 0058   BILIRUBINUR Negative 03/06/2014 0058   KETONESUR Negative 03/06/2014 0058   PROTEINUR Negative 03/06/2014 0058   NITRITE Negative 03/06/2014 0058   LEUKOCYTESUR Negative 03/06/2014 0058     Radiological Exams on Admission: CARDIAC CATHETERIZATION  Result Date: 09/30/2019  Mid Cx lesion is 30% stenosed.  Prox LAD lesion is 80% stenosed.  Post intervention, there is a 0% residual stenosis.  A drug-eluting stent was successfully placed using a STENT RESOLUTE ONYX 3.5X12.  1st Diag lesion is 40% stenosed.  RPDA lesion is 10% stenosed.  Dist RCA lesion is 10% stenosed.  Prox RCA to Mid RCA lesion is 30% stenosed.  The left ventricular systolic function is normal.  LV end diastolic pressure is normal.  Dist LAD lesion is 100% stenosed.  1.  Significant underlying two-vessel coronary artery disease with patent distal RCA stent with no significant  restenosis.  Occluded apical LAD is likely the culprit for ST elevation myocardial infarction.  In addition, there is an 80% eccentric complex plaque in the proximal LAD which likely was responsible for distal embolization. 2.  Normal LV systolic function with severe apical hypokinesis.  LVEDP was normal at 10 mmHg. 3.  Successful direct stenting of the proximal LAD and establishing flow in the apical LAD just by wiring the lesion and giving antiplatelet and anticoagulant medications. Recommendations: Continue dual antiplatelet therapy with aspirin and ticagrelor for at least 1 year. Aggressive treatment of risk factors. Lovenox for DVT prophylaxis. Treatment of Covid per internal medicine.    EKG: Independently reviewed.  STEMI  Assessment/Plan  Principal Problem STEMI -Patient underwent immediate cardiology consultation cardiac catheterization with drug-eluting stent placement as described above -Discussed with Dr. Fletcher Anon, if he remains stable may be able to go home in couple of days -Dual  antiplatelet therapy with aspirin and Brilinta per cardiology, started on Coreg and atorvastatin  Active Problems COVID-19 viral illness -Patient had low oxygen levels when EMS arrived however this appears to be chronic in the setting of his underlying ILD and based on patient's own pulse oximeter readings at home -Chest x-ray during this admission was stable without any acute changes -Given chronic hypoxia, chronic cough and no acute respiratory findings will not initiate remdesivir or steroids at this point -We will check CRP and D-dimer in the morning  ILD -Suspect a degree of chronic hypoxic respiratory failure, may need to be on oxygen at home based on patient's home O2 readings, to be determined on the day of discharge with an ambulatory pulse ox test  Hyperlipidemia -continue statin  Scheduled Meds: . aspirin  81 mg Oral Daily  . atorvastatin  40 mg Oral Daily  . carvedilol  3.125 mg Oral BID  WC  . [START ON 10/01/2019] enoxaparin (LOVENOX) injection  40 mg Subcutaneous Q24H  . Ipratropium-Albuterol  1 puff Inhalation Q6H  . mouth rinse  15 mL Mouth Rinse BID  . [START ON 10/01/2019] pneumococcal 23 valent vaccine  0.5 mL Intramuscular Tomorrow-1000  . [START ON 10/01/2019] sodium chloride flush  3 mL Intravenous Q12H  . ticagrelor  90 mg Oral BID  . vitamin C  500 mg Oral Daily  . zinc sulfate  220 mg Oral Daily   Continuous Infusions: . [START ON 10/01/2019] sodium chloride    . sodium chloride 1 mL/kg/hr (09/30/19 1636)   PRN Meds:.[START ON 10/01/2019] sodium chloride, acetaminophen, ondansetron (ZOFRAN) IV, [START ON 10/01/2019] sodium chloride flush   DVT prophylaxis: Lovenox  Code Status: Full code  Family Communication: No family at bedside Disposition Plan: Home when cleared by cardiology Bed Type: Stepdown Consults called: Cardiology Obs/Inp: Inpatient  Marzetta Board, MD, PhD [facility]  Contact via [email or web address]  09/30/2019, 5:01 PM

## 2019-09-30 NOTE — ED Notes (Signed)
PT's daughter called and was given update on pt condition and POC.  Verbalizes understanding of all information shared.  Due to pt's CoVid status, pt will not be allowed visitors.  Contact information verified and updated in computer.  Family aware that cardiology will call them with updates.

## 2019-09-30 NOTE — ED Notes (Signed)
Pt transported to cardiac cath

## 2019-09-30 NOTE — Consult Note (Signed)
Cardiology Consultation:   Patient ID: Dillon [name] MRN: [id number]; DOB: September 07, 1956  Admit date: 09/30/2019 Date of Consult: 09/30/2019  Primary Care Provider: [name], MD Primary Cardiologist: Reola Calkins Fletcher Anon) Primary Electrophysiologist:  None    Patient Profile:   Dillon [name] is a 63 y.o. male with a hx of coronary artery disease and hyperlipidemia who is being seen today for the evaluation of chest pain and abnormal EKG at the request of Dr. Cherylann Banas.  History of Present Illness:   Dillon [name] is a 63 year old male with known history of coronary artery disease status post RCA stent placement in 2010 at [facility], hyperlipidemia and recent diagnosis of COVID-19.  He was diagnosed with COVID-19 7 days ago and has been having body aches, low-grade fever and cough.  He reports that he was feeling better actually this morning but around 1 PM he started having severe substernal chest pain and tightness.  He called EMS.  He had an EKG done which showed minor inferior ST elevation with some reciprocal changes and thus a code STEMI was activated from the field.  The patient was given 2 sprays of nitroglycerin with resolution of chest pain in the field and he was chest pain-free upon arrival.  Minimal shortness of breath.  He was hypoxic on presentation at 88% on room air that improved with nasal cannula.  Given his symptoms and EKG changes, I recommended emergent cardiac catheterization.  I discussed the procedure in details as well as risks and benefits.  Patient was given 4000 units of unfractionated heparin.  He had already received aspirin by EMS.  Heart Pathway Score:     Past Medical History:  Diagnosis Date  . Coronary artery disease   . Elevated cholesterol   . Restless leg syndrome     Past Surgical History:  Procedure Laterality Date  . ANGIOPLASTY  2010   2 stents placed  . BACK SURGERY    . COLONOSCOPY WITH PROPOFOL N/A 03/05/2018   Procedure: COLONOSCOPY WITH PROPOFOL;   Surgeon: Virgel Manifold, MD;  Location: [facility] ENDOSCOPY;  Service: Endoscopy;  Laterality: N/A;  . CORONARY ANGIOPLASTY       Home Medications:  Prior to Admission medications   Medication Sig Start Date End Date Taking? Authorizing Provider  atorvastatin (LIPITOR) 40 MG tablet Take 40 mg by mouth daily. 06/10/18   [provider]  clopidogrel (PLAVIX) 75 MG tablet Take 75 mg by mouth every other day. Taking 1/2 tabled every other day    [provider]  nitroGLYCERIN (NITROSTAT) 0.3 MG SL tablet Place 0.3 mg under the tongue every 5 (five) minutes as needed for chest pain.    [provider]  polyethylene glycol (MIRALAX / GLYCOLAX) packet Take 17 g by mouth daily.    Virgel Manifold, MD    Inpatient Medications: Scheduled Meds: . aspirin  81 mg Oral Daily  . atorvastatin  40 mg Oral Daily  . carvedilol  3.125 mg Oral BID WC  . [START ON 10/01/2019] enoxaparin (LOVENOX) injection  40 mg Subcutaneous Q24H  . [START ON 10/01/2019] pneumococcal 23 valent vaccine  0.5 mL Intramuscular Tomorrow-1000  . [START ON 10/01/2019] sodium chloride flush  3 mL Intravenous Q12H  . ticagrelor  90 mg Oral BID   Continuous Infusions: . [START ON 10/01/2019] sodium chloride    . sodium chloride 1 mL/kg/hr (09/30/19 1636)   PRN Meds: [START ON 10/01/2019] sodium chloride, acetaminophen, ondansetron (ZOFRAN) IV, [START ON 10/01/2019] sodium chloride  flush  Allergies:    Allergies  Allergen Reactions  . Ace Inhibitors Swelling    Social History:   Social History   Socioeconomic History  . Marital status: Married    Spouse name: Not on file  . Number of children: Not on file  . Years of education: Not on file  . Highest education level: Not on file  Occupational History  . Not on file  Tobacco Use  . Smoking status: Former Smoker    Quit date: 03/05/2009    Years since quitting: 10.5  . Smokeless tobacco: Never Used  Substance and Sexual Activity  .  Alcohol use: Yes  . Drug use: Not Currently  . Sexual activity: Not on file  Other Topics Concern  . Not on file  Social History Narrative  . Not on file   Social Determinants of Health   Financial Resource Strain:   . Difficulty of Paying Living Expenses: Not on file  Food Insecurity:   . Worried About Charity fundraiser in the Last Year: Not on file  . Ran Out of Food in the Last Year: Not on file  Transportation Needs:   . Lack of Transportation (Medical): Not on file  . Lack of Transportation (Non-Medical): Not on file  Physical Activity:   . Days of Exercise per Week: Not on file  . Minutes of Exercise per Session: Not on file  Stress:   . Feeling of Stress : Not on file  Social Connections:   . Frequency of Communication with Friends and Family: Not on file  . Frequency of Social Gatherings with Friends and Family: Not on file  . Attends Religious Services: Not on file  . Active Member of Clubs or Organizations: Not on file  . Attends Archivist Meetings: Not on file  . Marital Status: Not on file  Intimate Partner Violence:   . Fear of Current or Ex-Partner: Not on file  . Emotionally Abused: Not on file  . Physically Abused: Not on file  . Sexually Abused: Not on file    Family History:   History reviewed.  Family history is negative for premature coronary artery disease.  ROS:  Please see the history of present illness.   All other ROS reviewed and negative.     Physical Exam/Data:   Vitals:   09/30/19 1443 09/30/19 1620 09/30/19 1630 09/30/19 1645  BP: (!) 159/95 125/67 123/84 123/83  Pulse:  75 77 71  Resp:  15 18 (!) 25  Temp:  98.7 F (37.1 C)    TempSrc:  Axillary    SpO2: 96% 98% 95% 97%  Weight:      Height:       No intake or output data in the 24 hours ending 09/30/19 1657 Last 3 Weights 09/30/2019 09/22/2018 06/15/2018  Weight (lbs) 205 lb 213 lb 3.2 oz 209 lb  Weight (kg) 92.987 kg 96.707 kg 94.802 kg     Body mass index is  26.32 kg/m.  General:  Well nourished, well developed, in no acute distress HEENT: normal Lymph: no adenopathy Neck: no JVD Endocrine:  No thryomegaly Vascular: No carotid bruits; FA pulses 2+ bilaterally without bruits  Cardiac:  normal S1, S2; RRR; no murmur  Lungs:  clear to auscultation bilaterally, no wheezing, rhonchi or rales  Abd: soft, nontender, no hepatomegaly  Ext: no edema Musculoskeletal:  No deformities, BUE and BLE strength normal and equal Skin: warm and dry  Neuro:  CNs 2-12  intact, no focal abnormalities noted Psych:  Normal affect   EKG:  The EKG was personally reviewed and demonstrates: Normal sinus rhythm with 1 mm of ST elevation in 3 and aVF with reciprocal ST depression in V1 and V2. Telemetry:  n/a  Relevant CV Studies: Pending  Laboratory Data:  High Sensitivity Troponin:   Recent Labs  Lab 09/30/19 1437  TROPONINIHS 21*     Chemistry Recent Labs  Lab 09/30/19 1437  NA 134*  K 4.0  CL 102  CO2 22  GLUCOSE 107*  BUN 13  CREATININE 0.76  CALCIUM 8.3*  GFRNONAA >60  GFRAA >60  ANIONGAP 10    No results for input(s): PROT, ALBUMIN, AST, ALT, ALKPHOS, BILITOT in the last 168 hours. Hematology Recent Labs  Lab 09/30/19 1437  WBC 8.6  RBC 4.46  HGB 14.0  HCT 40.9  MCV 91.7  MCH 31.4  MCHC 34.2  RDW 13.2  PLT 211   BNPNo results for input(s): BNP, PROBNP in the last 168 hours.  DDimer No results for input(s): DDIMER in the last 168 hours.   Radiology/Studies:  CARDIAC CATHETERIZATION  Result Date: 09/30/2019  Mid Cx lesion is 30% stenosed.  Prox LAD lesion is 80% stenosed.  Post intervention, there is a 0% residual stenosis.  A drug-eluting stent was successfully placed using a STENT RESOLUTE ONYX 3.5X12.  1st Diag lesion is 40% stenosed.  RPDA lesion is 10% stenosed.  Dist RCA lesion is 10% stenosed.  Prox RCA to Mid RCA lesion is 30% stenosed.  The left ventricular systolic function is normal.  LV end diastolic  pressure is normal.  Dist LAD lesion is 100% stenosed.  1.  Significant underlying two-vessel coronary artery disease with patent distal RCA stent with no significant restenosis.  Occluded apical LAD is likely the culprit for ST elevation myocardial infarction.  In addition, there is an 80% eccentric complex plaque in the proximal LAD which likely was responsible for distal embolization. 2.  Normal LV systolic function with severe apical hypokinesis.  LVEDP was normal at 10 mmHg. 3.  Successful direct stenting of the proximal LAD and establishing flow in the apical LAD just by wiring the lesion and giving antiplatelet and anticoagulant medications. Recommendations: Continue dual antiplatelet therapy with aspirin and ticagrelor for at least 1 year. Aggressive treatment of risk factors. Lovenox for DVT prophylaxis. Treatment of Covid per internal medicine.    Assessment and Plan:   1. ST elevation myocardial infarction: The patient's EKG was consistent with inferior ST elevation myocardial infarction.  Emergent cardiac catheterization was done via the right radial artery which showed that his apical LAD was occluded.  The LAD wraps around the apex to the inferior wall which explains his inferior ST changes.  In addition, there was 80% stenosis in the proximal LAD with an appearance of a complex eccentric plaque which might have been responsible for distal embolization.  This was treated successfully with PCI and drug-eluting stent placement.  The previously placed RCA stent was patent with no significant restenosis.  Ejection fraction was normal with severe apical hypokinesis.  Recommend continuing dual antiplatelet therapy with aspirin and ticagrelor for at least 1 year.  Continue aggressive treatment of risk factors. 2. COVID-19 infection likely with pneumonia and hypoxia: Treatment per internal medicine and critical care if needed.  Recommend DVT prophylaxis given hypercoagulable state with  Covid. 3. Hyperlipidemia: Continue treatment with atorvastatin.      For questions or updates, please contact [place] Please  consult [email or web address] for contact info under     Signed, [name], MD  09/30/2019 4:57 PM

## 2019-09-30 NOTE — Plan of Care (Signed)

## 2019-09-30 NOTE — ED Notes (Addendum)
Dr. Fletcher Anon and Dr. Cherylann Banas at bedside at this time

## 2019-10-01 DIAGNOSIS — I213 ST elevation (STEMI) myocardial infarction of unspecified site: Secondary | ICD-10-CM

## 2019-10-01 DIAGNOSIS — I2102 ST elevation (STEMI) myocardial infarction involving left anterior descending coronary artery: Secondary | ICD-10-CM

## 2019-10-01 DIAGNOSIS — U071 COVID-19: Secondary | ICD-10-CM

## 2019-10-01 LAB — BASIC METABOLIC PANEL
Anion gap: 8 (ref 5–15)
BUN: 13 mg/dL (ref 8–23)
CO2: 21 mmol/L — ABNORMAL LOW (ref 22–32)
Calcium: 8.2 mg/dL — ABNORMAL LOW (ref 8.9–10.3)
Chloride: 105 mmol/L (ref 98–111)
Creatinine, Ser: 0.56 mg/dL — ABNORMAL LOW (ref 0.61–1.24)
GFR calc Af Amer: >60 mL/min (ref >60)
GFR calc non Af Amer: >60 mL/min (ref >60)
Glucose, Bld: 95 mg/dL (ref 70–99)
Potassium: 4 mmol/L (ref 3.5–5.1)
Sodium: 134 mmol/L — ABNORMAL LOW (ref 135–145)

## 2019-10-01 LAB — CBC
HCT: 38 % — ABNORMAL LOW (ref 39.0–52.0)
Hemoglobin: 12.8 g/dL — ABNORMAL LOW (ref 13.0–17.0)
MCH: 31.8 pg (ref 26.0–34.0)
MCHC: 33.7 g/dL (ref 30.0–36.0)
MCV: 94.5 fL (ref 80.0–100.0)
Platelets: 210 10*3/uL (ref 150–400)
RBC: 4.02 MIL/uL — ABNORMAL LOW (ref 4.22–5.81)
RDW: 13.2 % (ref 11.5–15.5)
WBC: 6.6 10*3/uL (ref 4.0–10.5)
nRBC: 0 % (ref 0.0–0.2)

## 2019-10-01 LAB — HEPATIC FUNCTION PANEL
ALT: 84 U/L — ABNORMAL HIGH (ref 0–44)
AST: 74 U/L — ABNORMAL HIGH (ref 15–41)
Albumin: 2.8 g/dL — ABNORMAL LOW (ref 3.5–5.0)
Alkaline Phosphatase: 136 U/L — ABNORMAL HIGH (ref 38–126)
Bilirubin, Direct: 0.2 mg/dL (ref 0.0–0.2)
Indirect Bilirubin: 0.9 mg/dL (ref 0.3–0.9)
Total Bilirubin: 1.1 mg/dL (ref 0.3–1.2)
Total Protein: 7.7 g/dL (ref 6.5–8.1)

## 2019-10-01 LAB — FIBRIN DERIVATIVES D-DIMER (ARMC ONLY): Fibrin derivatives D-dimer (ARMC): 5934.79 ng/mL (FEU) — ABNORMAL HIGH (ref 0.00–499.00)

## 2019-10-01 LAB — C-REACTIVE PROTEIN: CRP: 2.7 mg/dL — ABNORMAL HIGH (ref <1.0)

## 2019-10-01 LAB — TROPONIN I (HIGH SENSITIVITY): Troponin I (High Sensitivity): 3766 ng/L (ref <18)

## 2019-10-01 LAB — HIV ANTIBODY (ROUTINE TESTING W REFLEX): HIV Screen 4th Generation wRfx: NONREACTIVE

## 2019-10-01 MED ORDER — METHYLPREDNISOLONE SODIUM SUCC 40 MG IJ SOLR
40.0000 mg | Freq: Two times a day (BID) | INTRAMUSCULAR | Status: DC
  Administered 2019-10-01 – 2019-10-05 (×8): 40 mg via INTRAVENOUS
  Filled 2019-10-01 (×8): qty 1

## 2019-10-01 MED ORDER — SODIUM CHLORIDE 0.9 % IV SOLN
100.0000 mg | Freq: Every day | INTRAVENOUS | Status: AC
  Administered 2019-10-02 – 2019-10-05 (×4): 100 mg via INTRAVENOUS
  Filled 2019-10-01 (×4): qty 100

## 2019-10-01 MED ORDER — CHLORHEXIDINE GLUCONATE CLOTH 2 % EX PADS
6.0000 | MEDICATED_PAD | Freq: Every day | CUTANEOUS | Status: DC
  Administered 2019-10-03 – 2019-10-05 (×3): 6 via TOPICAL

## 2019-10-01 MED ORDER — SODIUM CHLORIDE 0.9 % IV SOLN
200.0000 mg | Freq: Once | INTRAVENOUS | Status: AC
  Administered 2019-10-01: 10:00:00 200 mg via INTRAVENOUS
  Filled 2019-10-01: qty 200

## 2019-10-01 MED ORDER — ALBUTEROL SULFATE HFA 108 (90 BASE) MCG/ACT IN AERS
1.0000 | INHALATION_SPRAY | Freq: Four times a day (QID) | RESPIRATORY_TRACT | Status: DC
  Administered 2019-10-01 – 2019-10-05 (×17): 1 via RESPIRATORY_TRACT
  Filled 2019-10-01: qty 6.7

## 2019-10-01 MED ORDER — IPRATROPIUM BROMIDE HFA 17 MCG/ACT IN AERS
1.0000 | INHALATION_SPRAY | Freq: Four times a day (QID) | RESPIRATORY_TRACT | Status: DC
  Administered 2019-10-01 – 2019-10-05 (×17): 1 via RESPIRATORY_TRACT
  Filled 2019-10-01 (×2): qty 12.9

## 2019-10-01 MED ORDER — DEXAMETHASONE 4 MG PO TABS
4.0000 mg | ORAL_TABLET | Freq: Two times a day (BID) | ORAL | Status: DC
  Administered 2019-10-01: 10:00:00 4 mg via ORAL
  Filled 2019-10-01 (×2): qty 1

## 2019-10-01 NOTE — Progress Notes (Signed)
PROGRESS NOTE    [name]  [id number] DOB: 07-22-1956 DOA: 09/30/2019 PCP: [name], MD   Brief Narrative:  Dillon [name] is a 63 y.o. male with medical history significant of CAD, hyperlipidemia, recently diagnosed ILD, presents to the hospital with chief complaint of chest pain.  Patient has been having fatigue, some fevers over the last 10 days and has been diagnosed with Covid last week. He was diagnosed with a STEMI and taken straight to the Cath Lab.  Cardiac catheterization showed occluded apical LAD 80%, status post successful DES placement. Inflammatory markers were elevated so he was started on remdesivir.  Subjective: Patient was feeling better when seen this morning.  Denies any more chest pain.  Assessment & Plan:   Active Problems:   Acute ST elevation myocardial infarction (STEMI) of anterior wall [facility])  STEMI -Patient underwent immediate cardiology consultation cardiac catheterization with drug-eluting stent placement. Patient is doing better since procedure.  No acute complications. -He was started on dual antiplatelet with aspirin and Brilinta. -He was also started on Coreg and atorvastatin.  COVID-19 viral illness.  Patient did had some hypoxia on arrival to ED. Inflammatory markers are elevated and because of his underlying ILD he was started on steroid and remdesivir for 5 days. Today he was saturating in mid 90s on room air while resting. -He will need pulse ox with ambulation before discharge as told me that he does desaturate with activity at home.  Patient is not on home oxygen at this time.  Hyperlipidemia. -Continue statin.   Objective: Vitals:   10/01/19 0900 10/01/19 1000 10/01/19 1100 10/01/19 1200  BP:    (!) 127/57  Pulse: 77 70 70 70  Resp: (!) 31 20 18  (!) 29  Temp:    98.2 F (36.8 C)  TempSrc:    Oral  SpO2: 90% 93% 92% 95%  Weight:      Height:        Intake/Output Summary (Last 24 hours) at 10/01/2019 1228 Last  data filed at 10/01/2019 1200 Gross per 24 hour  Intake 1235.4 ml  Output 1050 ml  Net 185.4 ml   Filed Weights   09/30/19 1436  Weight: 93 kg    Examination:  General exam: Appears calm and comfortable  Respiratory system: Clear to auscultation. Respiratory effort normal. Cardiovascular system: S1 & S2 heard, RRR. No JVD, murmurs, rubs, gallops or clicks. No pedal edema. Gastrointestinal system: Abdomen is nondistended, soft and nontender. No organomegaly or masses felt. Normal bowel sounds heard. Central nervous system: Alert and oriented. No focal neurological deficits. Extremities: Symmetric 5 x 5 power. Skin: No rashes, lesions or ulcers Psychiatry: Judgement and insight appear normal. Mood & affect appropriate.   DVT prophylaxis: Lovenox Code Status: Full Family Communication: Updated the patient.  No family at bedside. Disposition Plan: Pending improvement and completion of remdesivir.  Consultants:   Cardiology  Procedures:  Antimicrobials:   Data Reviewed: I have personally reviewed following labs and imaging studies  CBC: Recent Labs  Lab 09/30/19 1437 10/01/19 0628  WBC 8.6 6.6  NEUTROABS 6.3  --   HGB 14.0 12.8*  HCT 40.9 38.0*  MCV 91.7 94.5  PLT 211 [id number]   Basic Metabolic Panel: Recent Labs  Lab 09/30/19 1437 10/01/19 0628  NA 134* 134*  K 4.0 4.0  CL 102 105  CO2 22 21*  GLUCOSE 107* 95  BUN 13 13  CREATININE 0.76 0.56*  CALCIUM 8.3* 8.2*   GFR: Estimated Creatinine Clearance:  109.9 mL/min (A) (by C-G formula based on SCr of 0.56 mg/dL (L)). Liver Function Tests: Recent Labs  Lab 10/01/19 0628  AST 74*  ALT 84*  ALKPHOS 136*  BILITOT 1.1  PROT 7.7  ALBUMIN 2.8*   No results for input(s): LIPASE, AMYLASE in the last 168 hours. No results for input(s): AMMONIA in the last 168 hours. Coagulation Profile: No results for input(s): INR, PROTIME in the last 168 hours. Cardiac Enzymes: No results for input(s): CKTOTAL, CKMB,  CKMBINDEX, TROPONINI in the last 168 hours. BNP (last 3 results) No results for input(s): PROBNP in the last 8760 hours. HbA1C: No results for input(s): HGBA1C in the last 72 hours. CBG: Recent Labs  Lab 09/30/19 1634  GLUCAP 102*   Lipid Profile: No results for input(s): CHOL, HDL, LDLCALC, TRIG, CHOLHDL, LDLDIRECT in the last 72 hours. Thyroid Function Tests: No results for input(s): TSH, T4TOTAL, FREET4, T3FREE, THYROIDAB in the last 72 hours. Anemia Panel: No results for input(s): VITAMINB12, FOLATE, FERRITIN, TIBC, IRON, RETICCTPCT in the last 72 hours. Sepsis Labs: No results for input(s): PROCALCITON, LATICACIDVEN in the last 168 hours.  Recent Results (from the past 240 hour(s))  Novel Coronavirus, NAA ([facility])     Status: Abnormal   Collection Time: 09/23/19 12:00 AM   Specimen: Nasopharyngeal(NP) swabs in vial transport medium   NASOPHARYNGE  TESTING  Result Value Ref Range Status   SARS-CoV-2, NAA Detected (A) Not Detected Final    Comment: Testing was performed using the cobas(R) SARS-CoV-2 test. This nucleic acid amplification test was developed and its performance characteristics determined by [employer]. Nucleic acid amplification tests include PCR and TMA. This test has not been FDA cleared or approved. This test has been authorized by FDA under an Emergency Use Authorization (EUA). This test is only authorized for the duration of time the declaration that circumstances exist justifying the authorization of the emergency use of in vitro diagnostic tests for detection of SARS-CoV-2 virus and/or diagnosis of COVID-19 infection under section 564(b)(1) of the Act, 21 U.S.C. PT:2852782) (1), unless the authorization is terminated or revoked sooner. When diagnostic testing is negative, the possibility of a false negative result should be considered in the context of a patient's recent exposures and the presence of clinical signs and symptoms consistent  with COVID-19. An individual without symptoms  of COVID-19 and who is not shedding SARS-CoV-2 virus would expect to have a negative (not detected) result in this assay.   MRSA PCR Screening     Status: None   Collection Time: 09/30/19  4:22 PM   Specimen: Nasal Mucosa; Nasopharyngeal  Result Value Ref Range Status   MRSA by PCR NEGATIVE NEGATIVE Final    Comment:        The GeneXpert MRSA Assay (FDA approved for NASAL specimens only), is one component of a comprehensive MRSA colonization surveillance program. It is not intended to diagnose MRSA infection nor to guide or monitor treatment for MRSA infections. Performed at [facility], [address], [place], [place] [address]      Radiology Studies: CARDIAC CATHETERIZATION  Result Date: 09/30/2019  Mid Cx lesion is 30% stenosed.  Prox LAD lesion is 80% stenosed.  Post intervention, there is a 0% residual stenosis.  A drug-eluting stent was successfully placed using a STENT RESOLUTE ONYX 3.5X12.  1st Diag lesion is 40% stenosed.  RPDA lesion is 10% stenosed.  Dist RCA lesion is 10% stenosed.  Prox RCA to Mid RCA lesion is 30% stenosed.  The left ventricular systolic function is normal.  LV end diastolic pressure is normal.  Dist LAD lesion is 100% stenosed.  1.  Significant underlying two-vessel coronary artery disease with patent distal RCA stent with no significant restenosis.  Occluded apical LAD is likely the culprit for ST elevation myocardial infarction.  In addition, there is an 80% eccentric complex plaque in the proximal LAD which likely was responsible for distal embolization. 2.  Normal LV systolic function with severe apical hypokinesis.  LVEDP was normal at 10 mmHg. 3.  Successful direct stenting of the proximal LAD and establishing flow in the apical LAD just by wiring the lesion and giving antiplatelet and anticoagulant medications. Recommendations: Continue dual antiplatelet therapy with aspirin and  ticagrelor for at least 1 year. Aggressive treatment of risk factors. Lovenox for DVT prophylaxis. Treatment of Covid per internal medicine.    Scheduled Meds: . ipratropium  1 puff Inhalation Q6H   And  . albuterol  1 puff Inhalation Q6H  . aspirin  81 mg Oral Daily  . atorvastatin  40 mg Oral Daily  . carvedilol  3.125 mg Oral BID WC  . Chlorhexidine Gluconate Cloth  6 each Topical Daily  . dexamethasone  4 mg Oral Q12H  . enoxaparin (LOVENOX) injection  40 mg Subcutaneous Q24H  . mouth rinse  15 mL Mouth Rinse BID  . pneumococcal 23 valent vaccine  0.5 mL Intramuscular Tomorrow-1000  . sodium chloride flush  3 mL Intravenous Q12H  . ticagrelor  90 mg Oral BID  . vitamin C  500 mg Oral Daily  . zinc sulfate  220 mg Oral Daily   Continuous Infusions: . sodium chloride    . [START ON 10/02/2019] remdesivir 100 mg in NS 100 mL       LOS: 1 day   Time spent: 45 minutes.  I personally reviewed his chart.  Lorella Nimrod, MD [facility] Pager [phone]  If 7PM-7AM, please contact night-coverage [email or web address] Password [facility] 10/01/2019, 12:28 PM   This record has been created using Dragon voice recognition software. Errors have been sought and corrected,but may not always be located. Such creation errors do not reflect on the standard of care.

## 2019-10-01 NOTE — Progress Notes (Signed)
Remdesivir - Pharmacy Brief Note   O:  ALT: 84 SpO2: 90% on RA (off/on O2)   A/P:  Remdesivir 200 mg IVPB once followed by 100 mg IVPB daily x 4 days.   Dillon [name] 10/01/2019 8:53 AM

## 2019-10-01 NOTE — Progress Notes (Signed)
Progress Note  Patient Name: Jasmine Nakayama Date of Encounter: 10/01/2019  Primary Cardiologist:   Subjective   Pt denies CP   Says his breathing is good   Inpatient Medications    Scheduled Meds: . ipratropium  1 puff Inhalation Q6H   And  . albuterol  1 puff Inhalation Q6H  . aspirin  81 mg Oral Daily  . atorvastatin  40 mg Oral Daily  . carvedilol  3.125 mg Oral BID WC  . Chlorhexidine Gluconate Cloth  6 each Topical Daily  . dexamethasone  4 mg Oral Q12H  . enoxaparin (LOVENOX) injection  40 mg Subcutaneous Q24H  . mouth rinse  15 mL Mouth Rinse BID  . pneumococcal 23 valent vaccine  0.5 mL Intramuscular Tomorrow-1000  . sodium chloride flush  3 mL Intravenous Q12H  . ticagrelor  90 mg Oral BID  . vitamin C  500 mg Oral Daily  . zinc sulfate  220 mg Oral Daily   Continuous Infusions: . sodium chloride    . remdesivir 200 mg in sodium chloride 0.9% 250 mL IVPB     Followed by  . [START ON 10/02/2019] remdesivir 100 mg in NS 100 mL     PRN Meds: sodium chloride, acetaminophen, ondansetron (ZOFRAN) IV, sodium chloride flush   Vital Signs    Vitals:   10/01/19 0700 10/01/19 0742 10/01/19 0800 10/01/19 0900  BP: 109/65 135/62    Pulse: (!) 56 69 77 77  Resp: (!) 22 19 (!) 22 (!) 31  Temp:  98.5 F (36.9 C)    TempSrc:  Oral    SpO2: 93% 93% 90% 90%  Weight:      Height:        Intake/Output Summary (Last 24 hours) at 10/01/2019 [id number] Last data filed at 10/01/2019 [id number] Gross per 24 hour  Intake 945.4 ml  Output 700 ml  Net 245.4 ml   Last 3 Weights 09/30/2019 09/22/2018 06/15/2018  Weight (lbs) 205 lb 213 lb 3.2 oz 209 lb  Weight (kg) 92.987 kg 96.707 kg 94.802 kg      Telemetry    SR   - Personally Reviewed  ECG     none today  - Personally Reviewed  Physical Exam   Exam deferred  Due to COVID  REviewed exam from hospitalist Dr Reesa Chew)   Viewed pt through door.    Labs    High Sensitivity Troponin:   Recent Labs  Lab 09/30/19 1437  09/30/19 1626 09/30/19 1831 09/30/19 2030  TROPONINIHS 21* 1,472* 3,766* 4,656*      Chemistry Recent Labs  Lab 09/30/19 1437 10/01/19 0628  NA 134* 134*  K 4.0 4.0  CL 102 105  CO2 22 21*  GLUCOSE 107* 95  BUN 13 13  CREATININE 0.76 0.56*  CALCIUM 8.3* 8.2*  PROT  --  7.7  ALBUMIN  --  2.8*  AST  --  74*  ALT  --  84*  ALKPHOS  --  136*  BILITOT  --  1.1  GFRNONAA >60 >60  GFRAA >60 >60  ANIONGAP 10 8     Hematology Recent Labs  Lab 09/30/19 1437 10/01/19 0628  WBC 8.6 6.6  RBC 4.46 4.02*  HGB 14.0 12.8*  HCT 40.9 38.0*  MCV 91.7 94.5  MCH 31.4 31.8  MCHC 34.2 33.7  RDW 13.2 13.2  PLT 211 210    BNPNo results for input(s): BNP, PROBNP in the last 168 hours.   DDimer No results  for input(s): DDIMER in the last 168 hours.   Radiology    CARDIAC CATHETERIZATION  Result Date: 09/30/2019  Mid Cx lesion is 30% stenosed.  Prox LAD lesion is 80% stenosed.  Post intervention, there is a 0% residual stenosis.  A drug-eluting stent was successfully placed using a STENT RESOLUTE ONYX 3.5X12.  1st Diag lesion is 40% stenosed.  RPDA lesion is 10% stenosed.  Dist RCA lesion is 10% stenosed.  Prox RCA to Mid RCA lesion is 30% stenosed.  The left ventricular systolic function is normal.  LV end diastolic pressure is normal.  Dist LAD lesion is 100% stenosed.  1.  Significant underlying two-vessel coronary artery disease with patent distal RCA stent with no significant restenosis.  Occluded apical LAD is likely the culprit for ST elevation myocardial infarction.  In addition, there is an 80% eccentric complex plaque in the proximal LAD which likely was responsible for distal embolization. 2.  Normal LV systolic function with severe apical hypokinesis.  LVEDP was normal at 10 mmHg. 3.  Successful direct stenting of the proximal LAD and establishing flow in the apical LAD just by wiring the lesion and giving antiplatelet and anticoagulant medications. Recommendations:  Continue dual antiplatelet therapy with aspirin and ticagrelor for at least 1 year. Aggressive treatment of risk factors. Lovenox for DVT prophylaxis. Treatment of Covid per internal medicine.    Cardiac Studies   Cath 09/30/19  Mid Cx lesion is 30% stenosed.  Prox LAD lesion is 80% stenosed.  Post intervention, there is a 0% residual stenosis.  A drug-eluting stent was successfully placed using a STENT RESOLUTE ONYX 3.5X12.  1st Diag lesion is 40% stenosed.  RPDA lesion is 10% stenosed.  Dist RCA lesion is 10% stenosed.  Prox RCA to Mid RCA lesion is 30% stenosed.  The left ventricular systolic function is normal.  LV end diastolic pressure is normal.  Dist LAD lesion is 100% stenosed.   1.  Significant underlying two-vessel coronary artery disease with patent distal RCA stent with no significant restenosis.  Occluded apical LAD is likely the culprit for ST elevation myocardial infarction.  In addition, there is an 80% eccentric complex plaque in the proximal LAD which likely was responsible for distal embolization. 2.  Normal LV systolic function with severe apical hypokinesis.  LVEDP was normal at 10 mmHg. 3.  Successful direct stenting of the proximal LAD and establishing flow in the apical LAD just by wiring the lesion and giving antiplatelet and anticoagulant medications.  Recommendations: Continue dual antiplatelet therapy with aspirin and ticagrelor for at least 1 year. Aggressive treatment of risk factors. Lovenox for DVT prophylaxis. Treatment of Covid per internal medicine.   Patient Profile     63 y.o. male with a hx of coronary artery disease and hyperlipidemia who is being seen today for the evaluation of chest pain and abnormal EKG at the request of Dr. Cherylann Banas.  Assessment & Plan    1  STEMI  Pt presents with CP and hypoxia  Underwent cardiac cath and PCI as noted above   Plan for ASA and Brilinta for at least 1 year    2.  COVID  Followed by  hospitalist service    3  HL  On lipitor 40   Check lipids in AM   For questions or updates, please contact [place] Please consult [email or web address] for contact info under        Signed, Dorris Carnes, MD  10/01/2019, 9:52 AM

## 2019-10-01 NOTE — Progress Notes (Signed)
Assisted tele visit to patient with family member.  Shareena Nusz P, RN  

## 2019-10-02 LAB — BASIC METABOLIC PANEL
Anion gap: 7 (ref 5–15)
BUN: 16 mg/dL (ref 8–23)
CO2: 20 mmol/L — ABNORMAL LOW (ref 22–32)
Calcium: 8.7 mg/dL — ABNORMAL LOW (ref 8.9–10.3)
Chloride: 107 mmol/L (ref 98–111)
Creatinine, Ser: 0.64 mg/dL (ref 0.61–1.24)
GFR calc Af Amer: >60 mL/min (ref >60)
GFR calc non Af Amer: >60 mL/min (ref >60)
Glucose, Bld: 153 mg/dL — ABNORMAL HIGH (ref 70–99)
Potassium: 4.2 mmol/L (ref 3.5–5.1)
Sodium: 134 mmol/L — ABNORMAL LOW (ref 135–145)

## 2019-10-02 LAB — CBC
HCT: 36.1 % — ABNORMAL LOW (ref 39.0–52.0)
Hemoglobin: 12.7 g/dL — ABNORMAL LOW (ref 13.0–17.0)
MCH: 31.8 pg (ref 26.0–34.0)
MCHC: 35.2 g/dL (ref 30.0–36.0)
MCV: 90.5 fL (ref 80.0–100.0)
Platelets: 232 10*3/uL (ref 150–400)
RBC: 3.99 MIL/uL — ABNORMAL LOW (ref 4.22–5.81)
RDW: 12.9 % (ref 11.5–15.5)
WBC: 8.9 10*3/uL (ref 4.0–10.5)
nRBC: 0 % (ref 0.0–0.2)

## 2019-10-02 LAB — TROPONIN I (HIGH SENSITIVITY): Troponin I (High Sensitivity): 1381 ng/L (ref <18)

## 2019-10-02 LAB — LIPID PANEL
Cholesterol: 103 mg/dL (ref 0–200)
HDL: 33 mg/dL — ABNORMAL LOW (ref >40)
LDL Cholesterol: 64 mg/dL (ref 0–99)
Total CHOL/HDL Ratio: 3.1 RATIO
Triglycerides: 30 mg/dL (ref <150)
VLDL: 6 mg/dL (ref 0–40)

## 2019-10-02 LAB — C-REACTIVE PROTEIN: CRP: 2.6 mg/dL — ABNORMAL HIGH (ref <1.0)

## 2019-10-02 LAB — FIBRIN DERIVATIVES D-DIMER (ARMC ONLY): Fibrin derivatives D-dimer (ARMC): 3609.55 ng/mL (FEU) — ABNORMAL HIGH (ref 0.00–499.00)

## 2019-10-02 NOTE — Progress Notes (Signed)
PROGRESS NOTE    Dillon [name]  [id number] DOB: September 04, 1956 DOA: 09/30/2019 PCP: [name], MD   Brief Narrative:  Dillon [name] is a 63 y.o. male with medical history significant of CAD, hyperlipidemia, recently diagnosed ILD, presents to the hospital with chief complaint of chest pain.  Patient has been having fatigue, some fevers over the last 10 days and has been diagnosed with Covid last week. He was diagnosed with a STEMI and taken straight to the Cath Lab.  Cardiac catheterization showed occluded apical LAD 80%, status post successful DES placement. Inflammatory markers were elevated so he was started on remdesivir.  Subjective: Patient was feeling better when seen this morning.  Denies any more chest pain.  No shortness of breath.  Assessment & Plan:   Active Problems:   Acute ST elevation myocardial infarction (STEMI) of anterior wall (HCC)   ST elevation myocardial infarction (STEMI) ([place])   COVID-19 virus infection  STEMI -Patient underwent immediate cardiology consultation cardiac catheterization with drug-eluting stent placement. Patient is doing better since procedure.  No acute complications.  Troponin improving -Continue dual antiplatelet with aspirin and Brilinta. -Continue Coreg and atorvastatin. -Patient is stable enough to be transferred to a telemetry bed.  COVID-19 viral illness.  Patient did had some hypoxia on arrival to ED. Inflammatory markers are elevated and because of his underlying ILD he was started on steroid and remdesivir for 5 days-day 2 today He did started desaturating with ambulation, saturating well in mid 90s on 2 L. -He will need pulse ox with ambulation before discharge as told me that he does desaturate with activity at home.  Patient is not on home oxygen at this time.  Hyperlipidemia. -Continue statin.   Objective: Vitals:   10/02/19 0800 10/02/19 0900 10/02/19 1000 10/02/19 1100  BP: 107/75 122/73 120/75   Pulse: 66 74 74  73  Resp: 18 20 (!) 22   Temp: (!) 97.4 F (36.3 C)     TempSrc: Oral     SpO2: 92% 92% 93% 91%  Weight:      Height:        Intake/Output Summary (Last 24 hours) at 10/02/2019 1337 Last data filed at 10/02/2019 1131 Gross per 24 hour  Intake 157.73 ml  Output 1475 ml  Net -1317.27 ml   Filed Weights   09/30/19 1436  Weight: 93 kg    Examination:  General exam: Appears calm and comfortable  Respiratory system: Clear to auscultation. Respiratory effort normal. Cardiovascular system: S1 & S2 heard, RRR. No JVD, murmurs, rubs, gallops or clicks. No pedal edema. Gastrointestinal system: Abdomen is nondistended, soft and nontender. No organomegaly or masses felt. Normal bowel sounds heard. Central nervous system: Alert and oriented. No focal neurological deficits. Extremities: Symmetric 5 x 5 power. Skin: No rashes, lesions or ulcers Psychiatry: Judgement and insight appear normal. Mood & affect appropriate.   DVT prophylaxis: Lovenox Code Status: Full Family Communication: Updated the patient.  No family at bedside. Disposition Plan: Pending improvement and completion of remdesivir.  Consultants:   Cardiology  Procedures:  Antimicrobials:   Data Reviewed: I have personally reviewed following labs and imaging studies  CBC: Recent Labs  Lab 09/30/19 1437 10/01/19 0628 10/02/19 0331  WBC 8.6 6.6 8.9  NEUTROABS 6.3  --   --   HGB 14.0 12.8* 12.7*  HCT 40.9 38.0* 36.1*  MCV 91.7 94.5 90.5  PLT 211 210 [id number]   Basic Metabolic Panel: Recent Labs  Lab 09/30/19 1437 10/01/19 [id number]  10/02/19 0331  NA 134* 134* 134*  K 4.0 4.0 4.2  CL 102 105 107  CO2 22 21* 20*  GLUCOSE 107* 95 153*  BUN 13 13 16   CREATININE 0.76 0.56* 0.64  CALCIUM 8.3* 8.2* 8.7*   GFR: Estimated Creatinine Clearance: 109.9 mL/min (by C-G formula based on SCr of 0.64 mg/dL). Liver Function Tests: Recent Labs  Lab 10/01/19 0628  AST 74*  ALT 84*  ALKPHOS 136*  BILITOT 1.1  PROT 7.7    ALBUMIN 2.8*   No results for input(s): LIPASE, AMYLASE in the last 168 hours. No results for input(s): AMMONIA in the last 168 hours. Coagulation Profile: No results for input(s): INR, PROTIME in the last 168 hours. Cardiac Enzymes: No results for input(s): CKTOTAL, CKMB, CKMBINDEX, TROPONINI in the last 168 hours. BNP (last 3 results) No results for input(s): PROBNP in the last 8760 hours. HbA1C: No results for input(s): HGBA1C in the last 72 hours. CBG: Recent Labs  Lab 09/30/19 1634  GLUCAP 102*   Lipid Profile: Recent Labs    10/02/19 0331  CHOL 103  HDL 33*  LDLCALC 64  TRIG 30  CHOLHDL 3.1   Thyroid Function Tests: No results for input(s): TSH, T4TOTAL, FREET4, T3FREE, THYROIDAB in the last 72 hours. Anemia Panel: No results for input(s): VITAMINB12, FOLATE, FERRITIN, TIBC, IRON, RETICCTPCT in the last 72 hours. Sepsis Labs: No results for input(s): PROCALCITON, LATICACIDVEN in the last 168 hours.  Recent Results (from the past 240 hour(s))  Novel Coronavirus, NAA ([facility])     Status: Abnormal   Collection Time: 09/23/19 12:00 AM   Specimen: Nasopharyngeal(NP) swabs in vial transport medium   NASOPHARYNGE  TESTING  Result Value Ref Range Status   SARS-CoV-2, NAA Detected (A) Not Detected Final    Comment: Testing was performed using the cobas(R) SARS-CoV-2 test. This nucleic acid amplification test was developed and its performance characteristics determined by [employer]. Nucleic acid amplification tests include PCR and TMA. This test has not been FDA cleared or approved. This test has been authorized by FDA under an Emergency Use Authorization (EUA). This test is only authorized for the duration of time the declaration that circumstances exist justifying the authorization of the emergency use of in vitro diagnostic tests for detection of SARS-CoV-2 virus and/or diagnosis of COVID-19 infection under section 564(b)(1) of the Act, 21  U.S.C. [id number]) (1), unless the authorization is terminated or revoked sooner. When diagnostic testing is negative, the possibility of a false negative result should be considered in the context of a patient's recent exposures and the presence of clinical signs and symptoms consistent with COVID-19. An individual without symptoms  of COVID-19 and who is not shedding SARS-CoV-2 virus would expect to have a negative (not detected) result in this assay.   MRSA PCR Screening     Status: None   Collection Time: 09/30/19  4:22 PM   Specimen: Nasal Mucosa; Nasopharyngeal  Result Value Ref Range Status   MRSA by PCR NEGATIVE NEGATIVE Final    Comment:        The GeneXpert MRSA Assay (FDA approved for NASAL specimens only), is one component of a comprehensive MRSA colonization surveillance program. It is not intended to diagnose MRSA infection nor to guide or monitor treatment for MRSA infections. Performed at [facility], [address] [place], [place] [address]      Radiology Studies: CARDIAC CATHETERIZATION  Result Date: 09/30/2019  Mid Cx lesion is 30% stenosed.  Prox LAD lesion  is 80% stenosed.  Post intervention, there is a 0% residual stenosis.  A drug-eluting stent was successfully placed using a STENT RESOLUTE ONYX 3.5X12.  1st Diag lesion is 40% stenosed.  RPDA lesion is 10% stenosed.  Dist RCA lesion is 10% stenosed.  Prox RCA to Mid RCA lesion is 30% stenosed.  The left ventricular systolic function is normal.  LV end diastolic pressure is normal.  Dist LAD lesion is 100% stenosed.  1.  Significant underlying two-vessel coronary artery disease with patent distal RCA stent with no significant restenosis.  Occluded apical LAD is likely the culprit for ST elevation myocardial infarction.  In addition, there is an 80% eccentric complex plaque in the proximal LAD which likely was responsible for distal embolization. 2.  Normal LV systolic function with severe  apical hypokinesis.  LVEDP was normal at 10 mmHg. 3.  Successful direct stenting of the proximal LAD and establishing flow in the apical LAD just by wiring the lesion and giving antiplatelet and anticoagulant medications. Recommendations: Continue dual antiplatelet therapy with aspirin and ticagrelor for at least 1 year. Aggressive treatment of risk factors. Lovenox for DVT prophylaxis. Treatment of Covid per internal medicine.    Scheduled Meds:  ipratropium  1 puff Inhalation Q6H   And   albuterol  1 puff Inhalation Q6H   aspirin  81 mg Oral Daily   atorvastatin  40 mg Oral Daily   carvedilol  3.125 mg Oral BID WC   Chlorhexidine Gluconate Cloth  6 each Topical Daily   enoxaparin (LOVENOX) injection  40 mg Subcutaneous Q24H   mouth rinse  15 mL Mouth Rinse BID   methylPREDNISolone (SOLU-MEDROL) injection  40 mg Intravenous Q12H   pneumococcal 23 valent vaccine  0.5 mL Intramuscular Tomorrow-1000   sodium chloride flush  3 mL Intravenous Q12H   ticagrelor  90 mg Oral BID   vitamin C  500 mg Oral Daily   zinc sulfate  220 mg Oral Daily   Continuous Infusions:  sodium chloride Stopped (10/02/19 1114)   remdesivir 100 mg in NS 100 mL 200 mL/hr at 10/02/19 1131     LOS: 2 days   Time spent: 35 minutes.    Lorella Nimrod, MD [facility] Pager [phone]  If 7PM-7AM, please contact night-coverage [email or web address] Password [facility] 10/02/2019, 1:37 PM   This record has been created using Systems analyst. Errors have been sought and corrected,but may not always be located. Such creation errors do not reflect on the standard of care.

## 2019-10-02 NOTE — Progress Notes (Signed)
Progress Note  Patient Name: Dillon [name] Date of Encounter: 10/02/2019  Primary Cardiologist:   NEW  Subjective   Pt dnies CP   Says his breathing is OK    Inpatient Medications    Scheduled Meds: . ipratropium  1 puff Inhalation Q6H   And  . albuterol  1 puff Inhalation Q6H  . aspirin  81 mg Oral Daily  . atorvastatin  40 mg Oral Daily  . carvedilol  3.125 mg Oral BID WC  . Chlorhexidine Gluconate Cloth  6 each Topical Daily  . enoxaparin (LOVENOX) injection  40 mg Subcutaneous Q24H  . mouth rinse  15 mL Mouth Rinse BID  . methylPREDNISolone (SOLU-MEDROL) injection  40 mg Intravenous Q12H  . pneumococcal 23 valent vaccine  0.5 mL Intramuscular Tomorrow-1000  . sodium chloride flush  3 mL Intravenous Q12H  . ticagrelor  90 mg Oral BID  . vitamin C  500 mg Oral Daily  . zinc sulfate  220 mg Oral Daily   Continuous Infusions: . sodium chloride 10 mL/hr at 10/02/19 0800  . remdesivir 100 mg in NS 100 mL     PRN Meds: sodium chloride, acetaminophen, ondansetron (ZOFRAN) IV, sodium chloride flush   Vital Signs    Vitals:   10/02/19 0500 10/02/19 0600 10/02/19 0700 10/02/19 0800  BP: 116/73 114/77 102/65 107/75  Pulse: 68 (!) 58 (!) 54 66  Resp: 15 17 18 18   Temp:    (!) 97.4 F (36.3 C)  TempSrc:    Oral  SpO2: 92% 94% 94% 92%  Weight:      Height:        Intake/Output Summary (Last 24 hours) at 10/02/2019 0950 Last data filed at 10/02/2019 0800 Gross per 24 hour  Intake 359.75 ml  Output 1825 ml  Net -1465.25 ml   Last 3 Weights 09/30/2019 09/22/2018 06/15/2018  Weight (lbs) 205 lb 213 lb 3.2 oz 209 lb  Weight (kg) 92.987 kg 96.707 kg 94.802 kg      Telemetry    NSR    - Personally Reviewed  ECG     none today  - Personally Reviewed  Physical Exam   PT examined by Dr Reesa Chew.   Given COVID + status I have reviewed   I have spoken to pt on the phone to confirm/discuss symptoms   Labs    High Sensitivity Troponin:   Recent Labs  Lab  09/30/19 1437 09/30/19 1626 09/30/19 1831 09/30/19 2030 10/02/19 0716  TROPONINIHS 21* 1,472* 3,766* 4,656* 1,381*      Chemistry Recent Labs  Lab 09/30/19 1437 10/01/19 0628 10/02/19 0331  NA 134* 134* 134*  K 4.0 4.0 4.2  CL 102 105 107  CO2 22 21* 20*  GLUCOSE 107* 95 153*  BUN 13 13 16   CREATININE 0.76 0.56* 0.64  CALCIUM 8.3* 8.2* 8.7*  PROT  --  7.7  --   ALBUMIN  --  2.8*  --   AST  --  74*  --   ALT  --  84*  --   ALKPHOS  --  136*  --   BILITOT  --  1.1  --   GFRNONAA >60 >60 >60  GFRAA >60 >60 >60  ANIONGAP 10 8 7      Hematology Recent Labs  Lab 09/30/19 1437 10/01/19 0628 10/02/19 0331  WBC 8.6 6.6 8.9  RBC 4.46 4.02* 3.99*  HGB 14.0 12.8* 12.7*  HCT 40.9 38.0* 36.1*  MCV 91.7 94.5 90.5  MCH 31.4 31.8 31.8  MCHC 34.2 33.7 35.2  RDW 13.2 13.2 12.9  PLT 211 210 232    BNPNo results for input(s): BNP, PROBNP in the last 168 hours.   DDimer No results for input(s): DDIMER in the last 168 hours.   Radiology    CARDIAC CATHETERIZATION  Result Date: 09/30/2019  Mid Cx lesion is 30% stenosed.  Prox LAD lesion is 80% stenosed.  Post intervention, there is a 0% residual stenosis.  A drug-eluting stent was successfully placed using a STENT RESOLUTE ONYX 3.5X12.  1st Diag lesion is 40% stenosed.  RPDA lesion is 10% stenosed.  Dist RCA lesion is 10% stenosed.  Prox RCA to Mid RCA lesion is 30% stenosed.  The left ventricular systolic function is normal.  LV end diastolic pressure is normal.  Dist LAD lesion is 100% stenosed.  1.  Significant underlying two-vessel coronary artery disease with patent distal RCA stent with no significant restenosis.  Occluded apical LAD is likely the culprit for ST elevation myocardial infarction.  In addition, there is an 80% eccentric complex plaque in the proximal LAD which likely was responsible for distal embolization. 2.  Normal LV systolic function with severe apical hypokinesis.  LVEDP was normal at 10 mmHg.  3.  Successful direct stenting of the proximal LAD and establishing flow in the apical LAD just by wiring the lesion and giving antiplatelet and anticoagulant medications. Recommendations: Continue dual antiplatelet therapy with aspirin and ticagrelor for at least 1 year. Aggressive treatment of risk factors. Lovenox for DVT prophylaxis. Treatment of Covid per internal medicine.    Cardiac Studies   Cath 09/30/19  Mid Cx lesion is 30% stenosed.  Prox LAD lesion is 80% stenosed.  Post intervention, there is a 0% residual stenosis.  A drug-eluting stent was successfully placed using a STENT RESOLUTE ONYX 3.5X12.  1st Diag lesion is 40% stenosed.  RPDA lesion is 10% stenosed.  Dist RCA lesion is 10% stenosed.  Prox RCA to Mid RCA lesion is 30% stenosed.  The left ventricular systolic function is normal.  LV end diastolic pressure is normal.  Dist LAD lesion is 100% stenosed.   1.  Significant underlying two-vessel coronary artery disease with patent distal RCA stent with no significant restenosis.  Occluded apical LAD is likely the culprit for ST elevation myocardial infarction.  In addition, there is an 80% eccentric complex plaque in the proximal LAD which likely was responsible for distal embolization. 2.  Normal LV systolic function with severe apical hypokinesis.  LVEDP was normal at 10 mmHg. 3.  Successful direct stenting of the proximal LAD and establishing flow in the apical LAD just by wiring the lesion and giving antiplatelet and anticoagulant medications.  Recommendations: Continue dual antiplatelet therapy with aspirin and ticagrelor for at least 1 year. Aggressive treatment of risk factors. Lovenox for DVT prophylaxis. Treatment of Covid per internal medicine.   Patient Profile     63 y.o. male with a hx of coronary artery disease and hyperlipidemia who is being seen today for the evaluation of chest pain and abnormal EKG at the request of Dr.  Cherylann Banas.  Assessment & Plan    1  STEMI  Pt presents with CP and hypoxia  Underwent cardiac cath and PCI as noted above   Plan for ASA and Brilinta for at least 1 year   WIll get echo once he is off of resp precautions    2.  COVID  Followed by hospitalist service  Currently on steroids and remdesivir  3  HL  On lipitor 40   Check lipids in AM  Spoke to wife to give update as well   For questions or updates, please contact [place] Please consult [email or web address] for contact info under        Signed, Dorris Carnes, MD  10/02/2019, 9:50 AM

## 2019-10-03 ENCOUNTER — Encounter: Payer: Self-pay | Admitting: Cardiology

## 2019-10-03 LAB — C-REACTIVE PROTEIN: CRP: 0.9 mg/dL (ref <1.0)

## 2019-10-03 LAB — FIBRIN DERIVATIVES D-DIMER (ARMC ONLY): Fibrin derivatives D-dimer (ARMC): 2246.81 ng/mL (FEU) — ABNORMAL HIGH (ref 0.00–499.00)

## 2019-10-03 MED ORDER — ENSURE ENLIVE PO LIQD
237.0000 mL | Freq: Three times a day (TID) | ORAL | Status: DC
  Administered 2019-10-03 – 2019-10-05 (×6): 237 mL via ORAL

## 2019-10-03 MED ORDER — HEART ATTACK BOUNCING BOOK: Freq: Once | Status: DC

## 2019-10-03 NOTE — Progress Notes (Signed)
Cardiovascular and Pulmonary Nurse Navigator Note:    Presentation:   Date of Admission:  11/30/2018 63 year old male with medical history of CAD, hyperlipidemia, recently diagnosed with interstitial lung disease who presented to the hospital with complaint of chest pain.  Patient was diagnosed with Covid last week and has been fatigued, having some fevers.  Patient was diagnosed with STEMI and was emergently taken to the cath lab.  Cardiac catheretization revealed occluded anterior left anterior descending artery 80%.  Patient is status post percutaneous coronary artery intervention with drug eluting stent.    In terms of COVID patient is on remdesivir and steroids.   Wellington Hampshire, MD (Primary)    Procedures Date:  09/30/2019 Coronary/Graft Acute MI Revascularization  LEFT HEART CATH AND CORONARY ANGIOGRAPHY  Conclusion    Mid Cx lesion is 30% stenosed.  Prox LAD lesion is 80% stenosed.  Post intervention, there is a 0% residual stenosis.  A drug-eluting stent was successfully placed using a STENT RESOLUTE ONYX 3.5X12.  1st Diag lesion is 40% stenosed.  RPDA lesion is 10% stenosed.  Dist RCA lesion is 10% stenosed.  Prox RCA to Mid RCA lesion is 30% stenosed.  The left ventricular systolic function is normal.  LV end diastolic pressure is normal.  Dist LAD lesion is 100% stenosed.   1.  Significant underlying two-vessel coronary artery disease with patent distal RCA stent with no significant restenosis.  Occluded apical LAD is likely the culprit for ST elevation myocardial infarction.  In addition, there is an 80% eccentric complex plaque in the proximal LAD which likely was responsible for distal embolization. 2.  Normal LV systolic function with severe apical hypokinesis.  LVEDP was normal at 10 mmHg. 3.  Successful direct stenting of the proximal LAD and establishing flow in the apical LAD just by wiring the lesion and giving antiplatelet and anticoagulant  medications.  Recommendations: Continue dual antiplatelet therapy with aspirin and ticagrelor for at least 1 year. Aggressive treatment of risk factors. Lovenox for DVT prophylaxis. Treatment of Covid per internal medicine.    NOTE:   This RN has ordered "Heart Attack Bouncing Back" booklet from pharmacy for this patient.  Dietitian consultation for diet education placed.    Patient quit smoking 10 years ago when he had his first stents placed.    If patient participates in Cardiac Rehab, patient will need to wait 20 days from testing positive for COVID prior to starting Cardiac Rehab.    Discussed Cardiac Rehab with patient over the phone.  Explained to patient his cardiologist has referred him to outpatient Cardiac Rehab at [facility].  An overview of the program was provided.   Patient reported that he did not participate in Cardiac Rehab when he had his stents placed at [facility] in 2010.  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   He described himself as being very active. Patient also talked about developing pulmonary fibrosis last year and about two to three weeks ago it "came back on him." Now patient positive for Covid and is receiving steroids and Remdesivir.   After thinking about Cardiac Rehab and his work schedule, patient has declined to participate in Cardiac Rehab. Patient stated, "My wife and daughter are going to stay after me to follow my diet and do what I am supposed to do."    Dr. Lavera Guise is patient's cardiologist.  Upon asking patient about if he had  a pulmonologist, patient stated he ws in the process of being referred to a pulmonologist when he was admitted to the hospital. Patient described his diet as being pretty good and reported to this RN he had recently lost 15 pounds.  This RN reviewed the American Heart Association guidelines for exercise for adults of moderate exercise (walking) for 150 minutes per week.    Patient  declined participation in Cardiac Rehab.  As a result, Cardiac Rehab referral closed.    This RN will continue to follow patient while patient hospitalized.     Roanna Epley, RN, BSN, [facility]  Cardiovascular & Pulmonary Nurse Navigator  Direct Line: [phone]  Department Phone #: [phone] Fax: [phone]  Email Address: [email or web address]

## 2019-10-03 NOTE — Progress Notes (Signed)
Progress Note  Patient Name: Dillon [name] Date of Encounter: 10/03/2019  Primary Cardiologist:   NEW Fletcher Anon)  Subjective   I reviewed the patient's chart but the patient was not seen by me given his positive Covid status.  I reviewed all the documentation.  He is currently chest pain-free with no shortness of breath.  Inpatient Medications    Scheduled Meds: . ipratropium  1 puff Inhalation Q6H   And  . albuterol  1 puff Inhalation Q6H  . aspirin  81 mg Oral Daily  . atorvastatin  40 mg Oral Daily  . carvedilol  3.125 mg Oral BID WC  . Chlorhexidine Gluconate Cloth  6 each Topical Daily  . enoxaparin (LOVENOX) injection  40 mg Subcutaneous Q24H  . feeding supplement (ENSURE ENLIVE)  237 mL Oral TID BM  . heart attack bouncing book   Does not apply Once  . mouth rinse  15 mL Mouth Rinse BID  . methylPREDNISolone (SOLU-MEDROL) injection  40 mg Intravenous Q12H  . sodium chloride flush  3 mL Intravenous Q12H  . ticagrelor  90 mg Oral BID  . vitamin C  500 mg Oral Daily  . zinc sulfate  220 mg Oral Daily   Continuous Infusions: . sodium chloride Stopped (10/03/19 0957)  . remdesivir 100 mg in NS 100 mL 100 mg (10/03/19 0906)   PRN Meds: sodium chloride, acetaminophen, ondansetron (ZOFRAN) IV, sodium chloride flush   Vital Signs    Vitals:   10/03/19 1000 10/03/19 1100 10/03/19 1200 10/03/19 1342  BP: 124/68  123/70 132/80  Pulse: 70 63 70 69  Resp: 15 18 14 17   Temp:   98.1 F (36.7 C) 98.1 F (36.7 C)  TempSrc:   Axillary Oral  SpO2: 92% 92% 94% 92%  Weight:      Height:        Intake/Output Summary (Last 24 hours) at 10/03/2019 1627 Last data filed at 10/03/2019 1341 Gross per 24 hour  Intake 130.76 ml  Output 2200 ml  Net -2069.24 ml   Last 3 Weights 09/30/2019 09/22/2018 06/15/2018  Weight (lbs) 205 lb 213 lb 3.2 oz 209 lb  Weight (kg) 92.987 kg 96.707 kg 94.802 kg      Telemetry    NSR    - Personally Reviewed  ECG     none today  -  Personally Reviewed  Physical Exam   PT examined by Dr Reesa Chew.   Given COVID + status I have reviewed     Labs    High Sensitivity Troponin:   Recent Labs  Lab 09/30/19 1437 09/30/19 1626 09/30/19 1831 09/30/19 2030 10/02/19 0716  TROPONINIHS 21* 1,472* 3,766* 4,656* 1,381*      Chemistry Recent Labs  Lab 09/30/19 1437 10/01/19 0628 10/02/19 0331  NA 134* 134* 134*  K 4.0 4.0 4.2  CL 102 105 107  CO2 22 21* 20*  GLUCOSE 107* 95 153*  BUN 13 13 16   CREATININE 0.76 0.56* 0.64  CALCIUM 8.3* 8.2* 8.7*  PROT  --  7.7  --   ALBUMIN  --  2.8*  --   AST  --  74*  --   ALT  --  84*  --   ALKPHOS  --  136*  --   BILITOT  --  1.1  --   GFRNONAA >60 >60 >60  GFRAA >60 >60 >60  ANIONGAP 10 8 7      Hematology Recent Labs  Lab 09/30/19 1437 10/01/19 [id number]  10/02/19 0331  WBC 8.6 6.6 8.9  RBC 4.46 4.02* 3.99*  HGB 14.0 12.8* 12.7*  HCT 40.9 38.0* 36.1*  MCV 91.7 94.5 90.5  MCH 31.4 31.8 31.8  MCHC 34.2 33.7 35.2  RDW 13.2 13.2 12.9  PLT 211 210 232    BNPNo results for input(s): BNP, PROBNP in the last 168 hours.   DDimer No results for input(s): DDIMER in the last 168 hours.   Radiology    No results found.  Cardiac Studies   Cath 09/30/19  Mid Cx lesion is 30% stenosed.  Prox LAD lesion is 80% stenosed.  Post intervention, there is a 0% residual stenosis.  A drug-eluting stent was successfully placed using a STENT RESOLUTE ONYX 3.5X12.  1st Diag lesion is 40% stenosed.  RPDA lesion is 10% stenosed.  Dist RCA lesion is 10% stenosed.  Prox RCA to Mid RCA lesion is 30% stenosed.  The left ventricular systolic function is normal.  LV end diastolic pressure is normal.  Dist LAD lesion is 100% stenosed.   1.  Significant underlying two-vessel coronary artery disease with patent distal RCA stent with no significant restenosis.  Occluded apical LAD is likely the culprit for ST elevation myocardial infarction.  In addition, there is an 80% eccentric  complex plaque in the proximal LAD which likely was responsible for distal embolization. 2.  Normal LV systolic function with severe apical hypokinesis.  LVEDP was normal at 10 mmHg. 3.  Successful direct stenting of the proximal LAD and establishing flow in the apical LAD just by wiring the lesion and giving antiplatelet and anticoagulant medications.  Recommendations: Continue dual antiplatelet therapy with aspirin and ticagrelor for at least 1 year. Aggressive treatment of risk factors. Lovenox for DVT prophylaxis. Treatment of Covid per internal medicine.   Patient Profile     63 y.o. male with a hx of coronary artery disease and hyperlipidemia who presented with chest pain and inferior ST elevation on EKG in the setting of COVID-19 infection.  Assessment & Plan    1  STEMI: Inferior ST elevation on EKG but the patient was actually found to have an occluded apical LAD that wraps around the apex supplying the inferior wall.  The occlusion was distal and did not require angioplasty but flow was established with wiring the lesion and improving the more proximal LAD lesion which was stented.  Continue aspirin and Brilinta for at least 1 year.  I discussed with case management to assist with Brilinta prescription.  The patient's ejection fraction was normal.  He is allergic to ACE inhibitors.  Currently on small dose carvedilol.  2.  COVID  Followed by hospitalist service    Currently on steroids and remdesivir  3  HL  On lipitor 40   : Lipid profile showed an LDL of 64.   The patient can be discharged home from a cardiac standpoint but currently is finishing treatment for COVID-19 with remdesivir and steroids.   For questions or updates, please contact [place] Please consult [email or web address] for contact info under        Signed, [name], MD  10/03/2019, 4:27 PM

## 2019-10-03 NOTE — Plan of Care (Signed)
Nutrition Education Note  RD consulted for nutrition education regarding a Heart Healthy diet.   63 y.o. male with medical history significant of CAD, hyperlipidemia, recently diagnosed ILD admitted with COVID 19 and STEMI  Lipid Panel     Component Value Date/Time   CHOL 103 10/02/2019 0331   TRIG 30 10/02/2019 0331   HDL 33 (L) 10/02/2019 0331   CHOLHDL 3.1 10/02/2019 0331   VLDL 6 10/02/2019 0331   LDLCALC 64 10/02/2019 0331    RD provided "Heart Healthy Nutrition Therapy" handout from the Academy of Nutrition and Dietetics. Reviewed patient's dietary recall. Provided examples on ways to decrease sodium and fat intake in diet. Discouraged intake of processed foods and use of salt shaker. Encouraged fresh fruits and vegetables as well as whole grain sources of carbohydrates to maximize fiber intake. Teach back method used.  Expect good compliance.  Body mass index is 26.32 kg/m. Pt meets criteria for overweight based on current BMI.  Current diet order is HH, patient is consuming approximately 100% of meals at this time. Labs and medications reviewed.   RD will add Ensure Enlive po TID, each supplement provides 350 kcal and 20 grams of protein  No further nutrition interventions warranted at this time. RD contact information provided. If additional nutrition issues arise, please re-consult RD.  Koleen Distance MS, RD, LDN Pager #- [phone] Office#- [phone] After Hours Pager: [phone]

## 2019-10-03 NOTE — Progress Notes (Signed)
PROGRESS NOTE    Dillon [name]  [id number] DOB: [birth year] DOA: 09/30/2019 PCP: Dillon Athens, MD   Brief Narrative:  Dillon [name] is a 63 y.o. male with medical history significant of CAD, hyperlipidemia, recently diagnosed ILD, presents to the hospital with chief complaint of chest pain.  Patient has been having fatigue, some fevers over the last 10 days and has been diagnosed with Covid last week. He was diagnosed with a STEMI and taken straight to the Cath Lab.  Cardiac catheterization showed occluded apical LAD 80%, status post successful DES placement. Inflammatory markers were elevated so he was started on remdesivir.  Subjective: Patient was feeling better when seen this morning.  Denies any more chest pain.  Saturating well on room air.  Assessment & Plan:   Active Problems:   Acute ST elevation myocardial infarction (STEMI) of anterior wall (HCC)   ST elevation myocardial infarction (STEMI) ([place])   COVID-19 virus infection  STEMI -Patient underwent immediate cardiology consultation cardiac catheterization with drug-eluting stent placement. Patient is doing better since procedure.  No acute complications.  Troponin improving -Continue dual antiplatelet with aspirin and Brilinta. -Continue Coreg and atorvastatin.  COVID-19 viral illness.  Patient did had some hypoxia on arrival to ED. Inflammatory markers are elevated and because of his underlying ILD he was started on steroid and remdesivir for 5 days-day 3 today. Inflammatory markers improving. He did started desaturating with ambulation, saturating well in mid 90s on 2 L. -He will need pulse ox with ambulation before discharge as told me that he does desaturate with activity at home.  Patient is not on home oxygen at this time.  Hyperlipidemia. -Continue statin.   Objective: Vitals:   10/03/19 1000 10/03/19 1100 10/03/19 1200 10/03/19 1342  BP: 124/68  123/70 132/80  Pulse: 70 63 70 69  Resp: 15 18 14 17     Temp:   98.1 F (36.7 C) 98.1 F (36.7 C)  TempSrc:   Axillary Oral  SpO2: 92% 92% 94% 92%  Weight:      Height:        Intake/Output Summary (Last 24 hours) at 10/03/2019 1428 Last data filed at 10/03/2019 1341 Gross per 24 hour  Intake 130.76 ml  Output 2200 ml  Net -2069.24 ml   Filed Weights   09/30/19 1436  Weight: 93 kg    Examination:  General exam: Appears calm and comfortable  Respiratory system: Clear to auscultation. Respiratory effort normal. Cardiovascular system: S1 & S2 heard, RRR. No JVD, murmurs, rubs, gallops or clicks. No pedal edema. Gastrointestinal system: Abdomen is nondistended, soft and nontender. No organomegaly or masses felt. Normal bowel sounds heard. Central nervous system: Alert and oriented. No focal neurological deficits. Extremities: Symmetric 5 x 5 power. Skin: No rashes, lesions or ulcers Psychiatry: Judgement and insight appear normal. Mood & affect appropriate.   DVT prophylaxis: Lovenox Code Status: Full Family Communication: Updated the patient.  No family at bedside. Disposition Plan: Pending improvement and completion of remdesivir.  Consultants:   Cardiology  Procedures:  Antimicrobials:   Data Reviewed: I have personally reviewed following labs and imaging studies  CBC: Recent Labs  Lab 09/30/19 1437 10/01/19 0628 10/02/19 0331  WBC 8.6 6.6 8.9  NEUTROABS 6.3  --   --   HGB 14.0 12.8* 12.7*  HCT 40.9 38.0* 36.1*  MCV 91.7 94.5 90.5  PLT 211 210 [id number]   Basic Metabolic Panel: Recent Labs  Lab 09/30/19 1437 10/01/19 0628 10/02/19 0331  NA 134*  134* 134*  K 4.0 4.0 4.2  CL 102 105 107  CO2 22 21* 20*  GLUCOSE 107* 95 153*  BUN 13 13 16   CREATININE 0.76 0.56* 0.64  CALCIUM 8.3* 8.2* 8.7*   GFR: Estimated Creatinine Clearance: 109.9 mL/min (by C-G formula based on SCr of 0.64 mg/dL). Liver Function Tests: Recent Labs  Lab 10/01/19 0628  AST 74*  ALT 84*  ALKPHOS 136*  BILITOT 1.1  PROT 7.7   ALBUMIN 2.8*   No results for input(s): LIPASE, AMYLASE in the last 168 hours. No results for input(s): AMMONIA in the last 168 hours. Coagulation Profile: No results for input(s): INR, PROTIME in the last 168 hours. Cardiac Enzymes: No results for input(s): CKTOTAL, CKMB, CKMBINDEX, TROPONINI in the last 168 hours. BNP (last 3 results) No results for input(s): PROBNP in the last 8760 hours. HbA1C: No results for input(s): HGBA1C in the last 72 hours. CBG: Recent Labs  Lab 09/30/19 1634  GLUCAP 102*   Lipid Profile: Recent Labs    10/02/19 0331  CHOL 103  HDL 33*  LDLCALC 64  TRIG 30  CHOLHDL 3.1   Thyroid Function Tests: No results for input(s): TSH, T4TOTAL, FREET4, T3FREE, THYROIDAB in the last 72 hours. Anemia Panel: No results for input(s): VITAMINB12, FOLATE, FERRITIN, TIBC, IRON, RETICCTPCT in the last 72 hours. Sepsis Labs: No results for input(s): PROCALCITON, LATICACIDVEN in the last 168 hours.  Recent Results (from the past 240 hour(s))  MRSA PCR Screening     Status: None   Collection Time: 09/30/19  4:22 PM   Specimen: Nasal Mucosa; Nasopharyngeal  Result Value Ref Range Status   MRSA by PCR NEGATIVE NEGATIVE Final    Comment:        The GeneXpert MRSA Assay (FDA approved for NASAL specimens only), is one component of a comprehensive MRSA colonization surveillance program. It is not intended to diagnose MRSA infection nor to guide or monitor treatment for MRSA infections. Performed at [facility], [address] [place], [place] [address]      Radiology Studies: No results found.  Scheduled Meds: . ipratropium  1 puff Inhalation Q6H   And  . albuterol  1 puff Inhalation Q6H  . aspirin  81 mg Oral Daily  . atorvastatin  40 mg Oral Daily  . carvedilol  3.125 mg Oral BID WC  . Chlorhexidine Gluconate Cloth  6 each Topical Daily  . enoxaparin (LOVENOX) injection  40 mg Subcutaneous Q24H  . heart attack bouncing book   Does not  apply Once  . mouth rinse  15 mL Mouth Rinse BID  . methylPREDNISolone (SOLU-MEDROL) injection  40 mg Intravenous Q12H  . sodium chloride flush  3 mL Intravenous Q12H  . ticagrelor  90 mg Oral BID  . vitamin C  500 mg Oral Daily  . zinc sulfate  220 mg Oral Daily   Continuous Infusions: . sodium chloride Stopped (10/03/19 0957)  . remdesivir 100 mg in NS 100 mL 100 mg (10/03/19 0906)     LOS: 3 days   Time spent: 35 minutes.    Lorella Nimrod, MD [facility] Pager [phone]  If 7PM-7AM, please contact night-coverage [email or web address] Password [facility] 10/03/2019, 2:28 PM   This record has been created using Dragon voice recognition software. Errors have been sought and corrected,but may not always be located. Such creation errors do not reflect on the standard of care.

## 2019-10-03 NOTE — Progress Notes (Signed)
Assisted tele visit to patient with wife.  Anav Lammert P, RN  

## 2019-10-04 LAB — C-REACTIVE PROTEIN: CRP: 0.8 mg/dL (ref <1.0)

## 2019-10-04 LAB — FIBRIN DERIVATIVES D-DIMER (ARMC ONLY): Fibrin derivatives D-dimer (ARMC): 1615.74 ng/mL (FEU) — ABNORMAL HIGH (ref 0.00–499.00)

## 2019-10-04 NOTE — Progress Notes (Signed)
PROGRESS NOTE    Dillon [name]  [id number] DOB: 1956-01-02 DOA: 09/30/2019 PCP: [name], MD   Brief Narrative:  Dillon [name] is a 63 y.o. male with medical history significant of CAD, hyperlipidemia, recently diagnosed ILD, presents to the hospital with chief complaint of chest pain.  Patient has been having fatigue, some fevers over the last 10 days and has been diagnosed with Covid last week. He was diagnosed with a STEMI and taken straight to the Cath Lab.  Cardiac catheterization showed occluded apical LAD 80%, status post successful DES placement. Inflammatory markers were elevated so he was started on remdesivir.  Subjective: Patient was feeling better when seen this morning.  Denies any chest pain or shortness of breath.  Assessment & Plan:   Active Problems:   Acute ST elevation myocardial infarction (STEMI) of anterior wall (HCC)   ST elevation myocardial infarction (STEMI) ([place])   COVID-19 virus infection  STEMI -Patient underwent immediate cardiology consultation cardiac catheterization with drug-eluting stent placement. Patient is doing better since procedure.  No acute complications.  Troponin improving -Continue dual antiplatelet with aspirin and Brilinta. -Continue Coreg and atorvastatin.  COVID-19 viral illness.  Patient did had some hypoxia on arrival to ED. Inflammatory markers are elevated and because of his underlying ILD he was started on steroid and remdesivir for 5 days-day 4 today. Inflammatory markers improving. He did started desaturating with ambulation, saturating well in mid 90s on 2 L. -He will need pulse ox with ambulation before discharge as told me that he does desaturate with activity at home.  Patient is not on home oxygen at this time.  Hyperlipidemia. -Continue statin.   Objective: Vitals:   10/03/19 2015 10/04/19 0512 10/04/19 0844 10/04/19 1206  BP: 135/80 110/79 119/73 125/72  Pulse: 70 63 (!) 57 65  Resp: 18 16  17     Temp: 98.3 F (36.8 C) 98.1 F (36.7 C)  98.2 F (36.8 C)  TempSrc: Axillary Oral  Oral  SpO2: 94% 94%  93%  Weight:      Height:        Intake/Output Summary (Last 24 hours) at 10/04/2019 1454 Last data filed at 10/04/2019 1218 Gross per 24 hour  Intake 8 ml  Output 1700 ml  Net -1692 ml   Filed Weights   09/30/19 1436  Weight: 93 kg    Examination:  General exam: Appears calm and comfortable  Respiratory system: Clear to auscultation. Respiratory effort normal. Cardiovascular system: S1 & S2 heard, RRR. No JVD, murmurs, rubs, gallops or clicks. No pedal edema. Gastrointestinal system: Abdomen is nondistended, soft and nontender. No organomegaly or masses felt. Normal bowel sounds heard. Central nervous system: Alert and oriented. No focal neurological deficits. Extremities: Symmetric 5 x 5 power. Skin: No rashes, lesions or ulcers Psychiatry: Judgement and insight appear normal. Mood & affect appropriate.   DVT prophylaxis: Lovenox Code Status: Full Family Communication: Updated the patient.  No family at bedside. Disposition Plan: Pending improvement and completion of remdesivir.  Consultants:   Cardiology  Procedures:  Antimicrobials:   Data Reviewed: I have personally reviewed following labs and imaging studies  CBC: Recent Labs  Lab 09/30/19 1437 10/01/19 0628 10/02/19 0331  WBC 8.6 6.6 8.9  NEUTROABS 6.3  --   --   HGB 14.0 12.8* 12.7*  HCT 40.9 38.0* 36.1*  MCV 91.7 94.5 90.5  PLT 211 210 [id number]   Basic Metabolic Panel: Recent Labs  Lab 09/30/19 1437 10/01/19 0628 10/02/19 0331  NA  134* 134* 134*  K 4.0 4.0 4.2  CL 102 105 107  CO2 22 21* 20*  GLUCOSE 107* 95 153*  BUN 13 13 16   CREATININE 0.76 0.56* 0.64  CALCIUM 8.3* 8.2* 8.7*   GFR: Estimated Creatinine Clearance: 109.9 mL/min (by C-G formula based on SCr of 0.64 mg/dL). Liver Function Tests: Recent Labs  Lab 10/01/19 0628  AST 74*  ALT 84*  ALKPHOS 136*  BILITOT 1.1  PROT  7.7  ALBUMIN 2.8*   No results for input(s): LIPASE, AMYLASE in the last 168 hours. No results for input(s): AMMONIA in the last 168 hours. Coagulation Profile: No results for input(s): INR, PROTIME in the last 168 hours. Cardiac Enzymes: No results for input(s): CKTOTAL, CKMB, CKMBINDEX, TROPONINI in the last 168 hours. BNP (last 3 results) No results for input(s): PROBNP in the last 8760 hours. HbA1C: No results for input(s): HGBA1C in the last 72 hours. CBG: Recent Labs  Lab 09/30/19 1634  GLUCAP 102*   Lipid Profile: Recent Labs    10/02/19 0331  CHOL 103  HDL 33*  LDLCALC 64  TRIG 30  CHOLHDL 3.1   Thyroid Function Tests: No results for input(s): TSH, T4TOTAL, FREET4, T3FREE, THYROIDAB in the last 72 hours. Anemia Panel: No results for input(s): VITAMINB12, FOLATE, FERRITIN, TIBC, IRON, RETICCTPCT in the last 72 hours. Sepsis Labs: No results for input(s): PROCALCITON, LATICACIDVEN in the last 168 hours.  Recent Results (from the past 240 hour(s))  MRSA PCR Screening     Status: None   Collection Time: 09/30/19  4:22 PM   Specimen: Nasal Mucosa; Nasopharyngeal  Result Value Ref Range Status   MRSA by PCR NEGATIVE NEGATIVE Final    Comment:        The GeneXpert MRSA Assay (FDA approved for NASAL specimens only), is one component of a comprehensive MRSA colonization surveillance program. It is not intended to diagnose MRSA infection nor to guide or monitor treatment for MRSA infections. Performed at [facility], [address], [place], [place] [address]      Radiology Studies: No results found.  Scheduled Meds: . ipratropium  1 puff Inhalation Q6H   And  . albuterol  1 puff Inhalation Q6H  . aspirin  81 mg Oral Daily  . atorvastatin  40 mg Oral Daily  . carvedilol  3.125 mg Oral BID WC  . Chlorhexidine Gluconate Cloth  6 each Topical Daily  . enoxaparin (LOVENOX) injection  40 mg Subcutaneous Q24H  . feeding supplement (ENSURE ENLIVE)   237 mL Oral TID BM  . heart attack bouncing book   Does not apply Once  . mouth rinse  15 mL Mouth Rinse BID  . methylPREDNISolone (SOLU-MEDROL) injection  40 mg Intravenous Q12H  . sodium chloride flush  3 mL Intravenous Q12H  . ticagrelor  90 mg Oral BID  . vitamin C  500 mg Oral Daily  . zinc sulfate  220 mg Oral Daily   Continuous Infusions: . sodium chloride 250 mL (10/04/19 0848)  . remdesivir 100 mg in NS 100 mL 100 mg (10/04/19 0850)     LOS: 4 days   Time spent: 30 minutes.    Lorella Nimrod, MD [facility] Pager [phone]  If 7PM-7AM, please contact night-coverage [email or web address] Password [facility] 10/04/2019, 2:54 PM   This record has been created using Systems analyst. Errors have been sought and corrected,but may not always be located. Such creation errors do not reflect on the standard of care.

## 2019-10-05 LAB — C-REACTIVE PROTEIN: CRP: 0.9 mg/dL (ref <1.0)

## 2019-10-05 LAB — FIBRIN DERIVATIVES D-DIMER (ARMC ONLY): Fibrin derivatives D-dimer (ARMC): 1203.18 ng/mL (FEU) — ABNORMAL HIGH (ref 0.00–499.00)

## 2019-10-05 MED ORDER — PREDNISONE 10 MG PO TABS: 20.0000 mg | ORAL_TABLET | Freq: Two times a day (BID) | ORAL | 0 refills | Status: AC

## 2019-10-05 MED ORDER — ALBUTEROL SULFATE HFA 108 (90 BASE) MCG/ACT IN AERS: 1.0000 | INHALATION_SPRAY | Freq: Four times a day (QID) | RESPIRATORY_TRACT | 0 refills | Status: DC

## 2019-10-05 MED ORDER — ASCORBIC ACID 500 MG PO TABS: 500.0000 mg | ORAL_TABLET | Freq: Every day | ORAL | Status: DC

## 2019-10-05 MED ORDER — CARVEDILOL 3.125 MG PO TABS: 3.1250 mg | ORAL_TABLET | Freq: Two times a day (BID) | ORAL | 1 refills | Status: DC

## 2019-10-05 MED ORDER — ENSURE ENLIVE PO LIQD: 237.0000 mL | Freq: Three times a day (TID) | ORAL | 12 refills | Status: DC

## 2019-10-05 MED ORDER — ZINC SULFATE 220 (50 ZN) MG PO CAPS: 220.0000 mg | ORAL_CAPSULE | Freq: Every day | ORAL | 0 refills | Status: DC

## 2019-10-05 MED ORDER — HEART ATTACK BOUNCING BOOK: 1.0000 | Freq: Once | Status: AC

## 2019-10-05 MED ORDER — TICAGRELOR 90 MG PO TABS: 90.0000 mg | ORAL_TABLET | Freq: Two times a day (BID) | ORAL | 1 refills | Status: DC

## 2019-10-05 NOTE — Discharge Summary (Signed)
Physician Discharge Summary  Dillon [name] [id number] DOB: 27-Aug-1956 DOA: 09/30/2019  PCP: [name], MD  Admit date: 09/30/2019 Discharge date: 10/05/2019  Admitted From: Home Disposition:  Home  Recommendations for Outpatient Follow-up:  1. Follow up with PCP in 1-2 weeks 2. Please obtain BMP/CBC in one week 3. Please follow up with cardiology 4. Please follow up with cardiac rehabilitation  Home Health: No  Equipment/Devices: None   Discharge Condition: Stable  CODE STATUS: Full  Diet recommendation: Heart Healthy  Brief/Interim Summary:  63 y.o.malewith medical history significant ofCAD, hyperlipidemia, recently diagnosed ILD, presents to the hospital with chief complaint of chest pain. Patient has been having fatigue, some fevers over the last 10 days and has been diagnosed with Covid last week.  He was diagnosed with a STEMI and taken straight to the Cath Lab. Cardiac catheterization showed occluded apical LAD 80%, status post successful DES placement.  Now on ASA and Brilinta for at least next 1 year.  Inflammatory markers were elevated so he was started on remdesivir and steroids for Covid-19. Completed 5 day course of remdesevir during admission.  He is clinically improved and maintaining oxygen saturations on room air with ambulation.  Stable for discharge home, to complete remaining course of steroids.  Follow up with cardiology as scheduled.  Patient counseled to self-quarantine at home.      Discharge Diagnoses: Active Problems:   Acute ST elevation myocardial infarction (STEMI) of anterior wall (HCC)   ST elevation myocardial infarction (STEMI) ([place])   COVID-19 virus infection    Discharge Instructions   Discharge Instructions    AMB Referral to Cardiac Rehabilitation - Phase II   Complete by: As directed    Diagnosis:  STEMI Coronary Stents     After initial evaluation and assessments completed: Virtual Based Care may be provided alone or in  conjunction with Phase 2 Cardiac Rehab based on patient barriers.: Yes   Call MD for:   Complete by: As directed    Worsening shortness of breath or cough, extreme fatigue.   Call MD for:  temperature >100.4   Complete by: As directed    Diet - low sodium heart healthy   Complete by: As directed    Discharge instructions   Complete by: As directed    Please take all medications as prescribed.   Follow up with your cardiologist. Please self-quarantine at home for the next 14 days, and wear mask when around other people.   Increase activity slowly   Complete by: As directed    Temperature monitoring   Complete by: Oct 05, 2019    After how many days would you like to receive a notification of this patient's flowsheet entries?: 1   MyChart COVID-19 home monitoring program   Complete by: Oct 06, 2019    Is the patient willing to use the [facility] for home monitoring?: Yes     Allergies as of 10/05/2019      Reactions   Ace Inhibitors Swelling      Medication List    STOP taking these medications   clopidogrel 75 MG tablet Commonly known as: PLAVIX   furosemide 20 MG tablet Commonly known as: LASIX     TAKE these medications   albuterol 108 (90 Base) MCG/ACT inhaler Commonly known as: VENTOLIN HFA Inhale 1 puff into the lungs every 6 (six) hours.   ascorbic acid 500 MG tablet Commonly known as: VITAMIN C Take 1 tablet (500 mg total) by mouth  daily. Start taking on: October 06, 2019   Aspirin Buf(CaCarb-MgCarb-MgO) 81 MG Tabs Take 1 tablet by mouth daily.   atorvastatin 40 MG tablet Commonly known as: LIPITOR Take 40 mg by mouth daily.   carvedilol 3.125 MG tablet Commonly known as: COREG Take 1 tablet (3.125 mg total) by mouth 2 (two) times daily with a meal.   enalapril 5 MG tablet Commonly known as: VASOTEC Take 5 mg by mouth daily.   feeding supplement (ENSURE ENLIVE) Liqd Take 237 mLs by mouth 3 (three) times daily between meals.   heart  attack bouncing book Misc 1 each by Does not apply route once for 1 dose.   nitroGLYCERIN 0.3 MG SL tablet Commonly known as: NITROSTAT Place 0.3 mg under the tongue every 5 (five) minutes as needed for chest pain.   polyethylene glycol 17 g packet Commonly known as: MIRALAX / GLYCOLAX Take 17 g by mouth daily.   predniSONE 10 MG tablet Commonly known as: DELTASONE Take 2 tablets (20 mg total) by mouth 2 (two) times daily with a meal for 7 days.   ticagrelor 90 MG Tabs tablet Commonly known as: BRILINTA Take 1 tablet (90 mg total) by mouth 2 (two) times daily.   zinc sulfate 220 (50 Zn) MG capsule Take 1 capsule (220 mg total) by mouth daily. Start taking on: October 06, 2019       Allergies  Allergen Reactions  . Ace Inhibitors Swelling    Consultations:  Cardiology   Procedures/Studies: CARDIAC CATHETERIZATION  Result Date: 09/30/2019  Mid Cx lesion is 30% stenosed.  Prox LAD lesion is 80% stenosed.  Post intervention, there is a 0% residual stenosis.  A drug-eluting stent was successfully placed using a STENT RESOLUTE ONYX 3.5X12.  1st Diag lesion is 40% stenosed.  RPDA lesion is 10% stenosed.  Dist RCA lesion is 10% stenosed.  Prox RCA to Mid RCA lesion is 30% stenosed.  The left ventricular systolic function is normal.  LV end diastolic pressure is normal.  Dist LAD lesion is 100% stenosed.  1.  Significant underlying two-vessel coronary artery disease with patent distal RCA stent with no significant restenosis.  Occluded apical LAD is likely the culprit for ST elevation myocardial infarction.  In addition, there is an 80% eccentric complex plaque in the proximal LAD which likely was responsible for distal embolization. 2.  Normal LV systolic function with severe apical hypokinesis.  LVEDP was normal at 10 mmHg. 3.  Successful direct stenting of the proximal LAD and establishing flow in the apical LAD just by wiring the lesion and giving antiplatelet and  anticoagulant medications. Recommendations: Continue dual antiplatelet therapy with aspirin and ticagrelor for at least 1 year. Aggressive treatment of risk factors. Lovenox for DVT prophylaxis. Treatment of Covid per internal medicine.       Subjective: Patient awake sitting up in bed.  No acute events reported overnight.  Patient off oxygen, RN ambulated patient and O2 sat remained 93%.  Patient denies fever/chills, SOB, cough or chest pain.  Indicates he feels ready to return home.   Discharge Exam: Vitals:   10/05/19 0816 10/05/19 1201  BP: 119/71 120/69  Pulse: (!) 58 63  Resp:    Temp:  98.2 F (36.8 C)  SpO2:  93%   Vitals:   10/04/19 2027 10/05/19 0521 10/05/19 0816 10/05/19 1201  BP: 140/72 126/72 119/71 120/69  Pulse: 65 68 (!) 58 63  Resp: 18 20    Temp: 98.3 F (36.8 C) 98.3  F (36.8 C)  98.2 F (36.8 C)  TempSrc: Oral Oral  Oral  SpO2: 95% 92%  93%  Weight:      Height:        General: Pt is alert, awake, not in acute distress Cardiovascular: RRR, S1/S2 +, no rubs, no gallops Respiratory: CTA bilaterally, no wheezing, no rhonchi Abdominal: Soft, NT, ND, bowel sounds + Extremities: no edema, no cyanosis    The results of significant diagnostics from this hospitalization (including imaging, microbiology, ancillary and laboratory) are listed below for reference.     Microbiology: Recent Results (from the past 240 hour(s))  MRSA PCR Screening     Status: None   Collection Time: 09/30/19  4:22 PM   Specimen: Nasal Mucosa; Nasopharyngeal  Result Value Ref Range Status   MRSA by PCR NEGATIVE NEGATIVE Final    Comment:        The GeneXpert MRSA Assay (FDA approved for NASAL specimens only), is one component of a comprehensive MRSA colonization surveillance program. It is not intended to diagnose MRSA infection nor to guide or monitor treatment for MRSA infections. Performed at [facility], [address] [place], [place] [address]       Labs: BNP (last 3 results) No results for input(s): BNP in the last 8760 hours. Basic Metabolic Panel: Recent Labs  Lab 09/30/19 1437 10/01/19 0628 10/02/19 0331  NA 134* 134* 134*  K 4.0 4.0 4.2  CL 102 105 107  CO2 22 21* 20*  GLUCOSE 107* 95 153*  BUN 13 13 16   CREATININE 0.76 0.56* 0.64  CALCIUM 8.3* 8.2* 8.7*   Liver Function Tests: Recent Labs  Lab 10/01/19 0628  AST 74*  ALT 84*  ALKPHOS 136*  BILITOT 1.1  PROT 7.7  ALBUMIN 2.8*   No results for input(s): LIPASE, AMYLASE in the last 168 hours. No results for input(s): AMMONIA in the last 168 hours. CBC: Recent Labs  Lab 09/30/19 1437 10/01/19 0628 10/02/19 0331  WBC 8.6 6.6 8.9  NEUTROABS 6.3  --   --   HGB 14.0 12.8* 12.7*  HCT 40.9 38.0* 36.1*  MCV 91.7 94.5 90.5  PLT 211 210 232   Cardiac Enzymes: No results for input(s): CKTOTAL, CKMB, CKMBINDEX, TROPONINI in the last 168 hours. BNP: Invalid input(s): POCBNP CBG: Recent Labs  Lab 09/30/19 1634  GLUCAP 102*   D-Dimer No results for input(s): DDIMER in the last 72 hours. Hgb A1c No results for input(s): HGBA1C in the last 72 hours. Lipid Profile No results for input(s): CHOL, HDL, LDLCALC, TRIG, CHOLHDL, LDLDIRECT in the last 72 hours. Thyroid function studies No results for input(s): TSH, T4TOTAL, T3FREE, THYROIDAB in the last 72 hours.  Invalid input(s): FREET3 Anemia work up No results for input(s): VITAMINB12, FOLATE, FERRITIN, TIBC, IRON, RETICCTPCT in the last 72 hours. Urinalysis    Component Value Date/Time   COLORURINE Yellow 03/06/2014 0058   APPEARANCEUR Clear 03/06/2014 0058   LABSPEC 1.025 03/06/2014 0058   PHURINE 5.0 03/06/2014 0058   GLUCOSEU Negative 03/06/2014 0058   HGBUR Negative 03/06/2014 0058   BILIRUBINUR Negative 03/06/2014 0058   KETONESUR Negative 03/06/2014 0058   PROTEINUR Negative 03/06/2014 0058   NITRITE Negative 03/06/2014 0058   LEUKOCYTESUR Negative 03/06/2014 0058   Sepsis Labs Invalid  input(s): PROCALCITONIN,  WBC,  LACTICIDVEN Microbiology Recent Results (from the past 240 hour(s))  MRSA PCR Screening     Status: None   Collection Time: 09/30/19  4:22 PM   Specimen: Nasal  Mucosa; Nasopharyngeal  Result Value Ref Range Status   MRSA by PCR NEGATIVE NEGATIVE Final    Comment:        The GeneXpert MRSA Assay (FDA approved for NASAL specimens only), is one component of a comprehensive MRSA colonization surveillance program. It is not intended to diagnose MRSA infection nor to guide or monitor treatment for MRSA infections. Performed at [facility], [address], [place], [place] [address]      Time coordinating discharge: Over 30 minutes  SIGNED:   Ezekiel Slocumb, DO [facility] 10/05/2019, 12:11 PM Pager [phone]  If 7PM-7AM, please contact night-coverage [email or web address] Password [email or web address]

## 2019-10-05 NOTE — Progress Notes (Signed)
[name] to be D/C'd home with daughter per MD order.  Discussed prescriptions and follow up appointments with the patient. Prescriptions given to patient, medication list explained in detail. Pt verbalized understanding.  Allergies as of 10/05/2019       Reactions   Ace Inhibitors Swelling        Medication List     STOP taking these medications    clopidogrel 75 MG tablet Commonly known as: PLAVIX   furosemide 20 MG tablet Commonly known as: LASIX       TAKE these medications    albuterol 108 (90 Base) MCG/ACT inhaler Commonly known as: VENTOLIN HFA Inhale 1 puff into the lungs every 6 (six) hours.   ascorbic acid 500 MG tablet Commonly known as: VITAMIN C Take 1 tablet (500 mg total) by mouth daily. Start taking on: October 06, 2019   Aspirin Buf(CaCarb-MgCarb-MgO) 81 MG Tabs Take 1 tablet by mouth daily.   atorvastatin 40 MG tablet Commonly known as: LIPITOR Take 40 mg by mouth daily.   carvedilol 3.125 MG tablet Commonly known as: COREG Take 1 tablet (3.125 mg total) by mouth 2 (two) times daily with a meal.   enalapril 5 MG tablet Commonly known as: VASOTEC Take 5 mg by mouth daily.   feeding supplement (ENSURE ENLIVE) Liqd Take 237 mLs by mouth 3 (three) times daily between meals.   heart attack bouncing book Misc 1 each by Does not apply route once for 1 dose.   nitroGLYCERIN 0.3 MG SL tablet Commonly known as: NITROSTAT Place 0.3 mg under the tongue every 5 (five) minutes as needed for chest pain.   polyethylene glycol 17 g packet Commonly known as: MIRALAX / GLYCOLAX Take 17 g by mouth daily.   predniSONE 10 MG tablet Commonly known as: DELTASONE Take 2 tablets (20 mg total) by mouth 2 (two) times daily with a meal for 7 days.   ticagrelor 90 MG Tabs tablet Commonly known as: BRILINTA Take 1 tablet (90 mg total) by mouth 2 (two) times daily.   zinc sulfate 220 (50 Zn) MG capsule Take 1 capsule (220 mg total) by mouth  daily. Start taking on: October 06, 2019        Vitals:   10/05/19 0816 10/05/19 1201  BP: 119/71 120/69  Pulse: (!) 58 63  Resp:    Temp:  98.2 F (36.8 C)  SpO2:  93%    Skin clean, dry and intact without evidence of skin break down, no evidence of skin tears noted. IV catheter discontinued intact. Site without signs and symptoms of complications. Dressing and pressure applied. Pt denies pain at this time. No complaints noted.  An After Visit Summary was printed and given to the patient. Patient escorted via [place], and D/C home via private auto.  Luck A India Jolin

## 2019-10-05 NOTE — Discharge Instructions (Signed)
COVID-19 COVID-19 is a respiratory infection that is caused by a virus called severe acute respiratory syndrome coronavirus 2 (SARS-CoV-2). The disease is also known as coronavirus disease or novel coronavirus. In some people, the virus may not cause any symptoms. In others, it may cause a serious infection. The infection can get worse quickly and can lead to complications, such as:  Pneumonia, or infection of the lungs.  Acute respiratory distress syndrome or ARDS. This is fluid build-up in the lungs.  Acute respiratory failure. This is a condition in which there is not enough oxygen passing from the lungs to the body.  Sepsis or septic shock. This is a serious bodily reaction to an infection.  Blood clotting problems.  Secondary infections due to bacteria or fungus. The virus that causes COVID-19 is contagious. This means that it can spread from person to person through droplets from coughs and sneezes (respiratory secretions). What are the causes? This illness is caused by a virus. You may catch the virus by:  Breathing in droplets from an infected person's cough or sneeze.  Touching something, like a table or a doorknob, that was exposed to the virus (contaminated) and then touching your mouth, nose, or eyes. What increases the risk? Risk for infection You are more likely to be infected with this virus if you:  Live in or travel to an area with a COVID-19 outbreak.  Come in contact with a sick person who recently traveled to an area with a COVID-19 outbreak.  Provide care for or live with a person who is infected with COVID-19. Risk for serious illness You are more likely to become seriously ill from the virus if you:  Are 65 years of age or older.  Have a long-term disease that lowers your body's ability to fight infection (immunocompromised).  Live in a nursing home or long-term care facility.  Have a long-term (chronic) disease such as: ? Chronic lung disease, including  chronic obstructive pulmonary disease or asthma ? Heart disease. ? Diabetes. ? Chronic kidney disease. ? Liver disease.  Are obese. What are the signs or symptoms? Symptoms of this condition can range from mild to severe. Symptoms may appear any time from 2 to 14 days after being exposed to the virus. They include:  A fever.  A cough.  Difficulty breathing.  Chills.  Muscle pains.  A sore throat.  Loss of taste or smell. Some people may also have stomach problems, such as nausea, vomiting, or diarrhea. Other people may not have any symptoms of COVID-19. How is this diagnosed? This condition may be diagnosed based on:  Your signs and symptoms, especially if: ? You live in an area with a COVID-19 outbreak. ? You recently traveled to or from an area where the virus is common. ? You provide care for or live with a person who was diagnosed with COVID-19.  A physical exam.  Lab tests, which may include: ? A nasal swab to take a sample of fluid from your nose. ? A throat swab to take a sample of fluid from your throat. ? A sample of mucus from your lungs (sputum). ? Blood tests.  Imaging tests, which may include, X-rays, CT scan, or ultrasound. How is this treated? At present, there is no medicine to treat COVID-19. Medicines that treat other diseases are being used on a trial basis to see if they are effective against COVID-19. Your health care provider will talk with you about ways to treat your symptoms. For most   people, the infection is mild and can be managed at home with rest, fluids, and over-the-counter medicines. Treatment for a serious infection usually takes places in a hospital intensive care unit (ICU). It may include one or more of the following treatments. These treatments are given until your symptoms improve.  Receiving fluids and medicines through an IV.  Supplemental oxygen. Extra oxygen is given through a tube in the nose, a face mask, or a  hood.  Positioning you to lie on your stomach (prone position). This makes it easier for oxygen to get into the lungs.  Continuous positive airway pressure (CPAP) or bi-level positive airway pressure (BPAP) machine. This treatment uses mild air pressure to keep the airways open. A tube that is connected to a motor delivers oxygen to the body.  Ventilator. This treatment moves air into and out of the lungs by using a tube that is placed in your windpipe.  Tracheostomy. This is a procedure to create a hole in the neck so that a breathing tube can be inserted.  Extracorporeal membrane oxygenation (ECMO). This procedure gives the lungs a chance to recover by taking over the functions of the heart and lungs. It supplies oxygen to the body and removes carbon dioxide. Follow these instructions at home: Lifestyle  If you are sick, stay home except to get medical care. Your health care provider will tell you how long to stay home. Call your health care provider before you go for medical care.  Rest at home as told by your health care provider.  Do not use any products that contain nicotine or tobacco, such as cigarettes, e-cigarettes, and chewing tobacco. If you need help quitting, ask your health care provider.  Return to your normal activities as told by your health care provider. Ask your health care provider what activities are safe for you. General instructions  Take over-the-counter and prescription medicines only as told by your health care provider.  Drink enough fluid to keep your urine pale yellow.  Keep all follow-up visits as told by your health care provider. This is important. How is this prevented?  There is no vaccine to help prevent COVID-19 infection. However, there are steps you can take to protect yourself and others from this virus. To protect yourself:   Do not travel to areas where COVID-19 is a risk. The areas where COVID-19 is reported change often. To identify  high-risk areas and travel restrictions, check the CDC travel website: wwwnc.cdc.gov/travel/notices  If you live in, or must travel to, an area where COVID-19 is a risk, take precautions to avoid infection. ? Stay away from people who are sick. ? Wash your hands often with soap and water for 20 seconds. If soap and water are not available, use an alcohol-based hand sanitizer. ? Avoid touching your mouth, face, eyes, or nose. ? Avoid going out in public, follow guidance from your state and local health authorities. ? If you must go out in public, wear a cloth face covering or face mask. ? Disinfect objects and surfaces that are frequently touched every day. This may include:  Counters and tables.  Doorknobs and light switches.  Sinks and faucets.  Electronics, such as phones, remote controls, keyboards, computers, and tablets. To protect others: If you have symptoms of COVID-19, take steps to prevent the virus from spreading to others.  If you think you have a COVID-19 infection, contact your health care provider right away. Tell your health care team that you think you may   have a COVID-19 infection.  Stay home. Leave your house only to seek medical care. Do not use public transport.  Do not travel while you are sick.  Wash your hands often with soap and water for 20 seconds. If soap and water are not available, use alcohol-based hand sanitizer.  Stay away from other members of your household. Let healthy household members care for children and pets, if possible. If you have to care for children or pets, wash your hands often and wear a mask. If possible, stay in your own room, separate from others. Use a different bathroom.  Make sure that all people in your household wash their hands well and often.  Cough or sneeze into a tissue or your sleeve or elbow. Do not cough or sneeze into your hand or into the air.  Wear a cloth face covering or face mask. Where to find more  information  Centers for Disease Control and Prevention: PurpleGadgets.be  World Health Organization: https://www.castaneda.info/ Contact a health care provider if:  You live in or have traveled to an area where COVID-19 is a risk and you have symptoms of the infection.  You have had contact with someone who has COVID-19 and you have symptoms of the infection. Get help right away if:  You have trouble breathing.  You have pain or pressure in your chest.  You have confusion.  You have bluish lips and fingernails.  You have difficulty waking from sleep.  You have symptoms that get worse. These symptoms may represent a serious problem that is an emergency. Do not wait to see if the symptoms will go away. Get medical help right away. Call your local emergency services (911 in the [place] Do not drive yourself to the hospital. Let the emergency medical personnel know if you think you have COVID-19. Summary  COVID-19 is a respiratory infection that is caused by a virus. It is also known as coronavirus disease or novel coronavirus. It can cause serious infections, such as pneumonia, acute respiratory distress syndrome, acute respiratory failure, or sepsis.  The virus that causes COVID-19 is contagious. This means that it can spread from person to person through droplets from coughs and sneezes.  You are more likely to develop a serious illness if you are 3 years of age or older, have a weak immunity, live in a nursing home, or have chronic disease.  There is no medicine to treat COVID-19. Your health care provider will talk with you about ways to treat your symptoms.  Take steps to protect yourself and others from infection. Wash your hands often and disinfect objects and surfaces that are frequently touched every day. Stay away from people who are sick and wear a mask if you are sick. This information is not intended to replace advice given to you by  your health care provider. Make sure you discuss any questions you have with your health care provider. Document Released: 11/11/2018 Document Revised: 03/03/2019 Document Reviewed: 11/11/2018 Elsevier Patient Education  2020 [place] heart is a muscle that needs oxygen to survive. A heart attack is a condition that occurs when your heart does not get enough oxygen. When this happens, the heart muscle begins to die. This can cause permanent damage if not treated right away. A heart attack is a medical emergency. This condition may be called a myocardial infarction, or MI. It is also known as acute coronary syndrome (ACS). ACS is a term used to describe a  group of conditions that affect blood flow to the heart. What are the causes? This condition may be caused by:  Atherosclerosis. This occurs when a fatty substance called plaque builds up in the arteries and blocks or reduces blood supply to the heart.  A blood clot. A blood clot can develop suddenly when plaque breaks up within an artery and blocks blood flow to the heart.  Low blood pressure.  An abnormal heartbeat (arrhythmia).  Conditions that cause a decrease of oxygen to the heart, such as anemiaorrespiratory failure.  A spasm, or severe tightening, of a blood vessel that cuts off blood flow to the heart.  Tearing of a coronary artery (spontaneous coronary artery dissection).  High blood pressure. What increases the risk? The following factors may make you more likely to develop this condition:  Aging. The older you are, the higher your risk.  Having a personal or family history of chest pain, heart attack, stroke, or narrowing of the arteries in the legs, arms, head, or stomach (peripheral artery disease).  Being male.  Smoking.  Not getting regular exercise.  Being overweight or obese.  Having high blood pressure.  Having high cholesterol (hypercholesterolemia).  Having  diabetes.  Drinking too much alcohol.  Using illegal drugs, such as cocaine or methamphetamine. What are the signs or symptoms? Symptoms of this condition may vary, depending on factors like gender and age. Symptoms may include:  Chest pain. It may feel like: ? Crushing or squeezing. ? Tightness, pressure, fullness, or heaviness.  Pain in the arm, neck, jaw, back, or upper body.  Shortness of breath.  Heartburn or upset stomach.  Nausea.  Sudden cold sweats.  Feeling tired.  Sudden light-headedness. How is this diagnosed? This condition may be diagnosed through tests, such as:  Electrocardiogram (ECG) to measure the electrical activity of your heart.  Blood tests to check for cardiac markers. These chemicals are released by a damaged heart muscle.  A test to evaluate blood flow and heart function (coronary angiogram).  CT scan to see the heart more clearly.  A test to evaluate the pumping action of the heart (echocardiogram). How is this treated? A heart attack must be treated as soon as possible. Treatment may include:  Medicines to: ? Break up or dissolve blood clots (fibrinolytic therapy). ? Thin blood and help prevent blood clots. ? Treat blood pressure. ? Improve blood flow to the heart. ? Reduce pain. ? Reduce cholesterol.  Angioplasty and stent placement. These are procedures to widen a blocked artery and keep it open.  Coronary artery bypass graft, CABG, or open heart surgery. This enables blood to flow to the heart by going around the blocked part of the artery.  Oxygen therapy if needed.  Cardiac rehabilitation. This improves your health and well-being through exercise, education, and counseling. Follow these instructions at home: Medicines  Take over-the-counter and prescription medicines only as told by your health care provider.  Do not take the following medicines unless your health care provider says it is okay to take them: ? NSAIDs, such  as ibuprofen. ? Supplements that contain vitamin A, vitamin E, or both. ? Hormone replacement therapy that contains estrogen with or without progestin. Lifestyle   Do not use any products that contain nicotine or tobacco, such as cigarettes, e-cigarettes, and chewing tobacco. If you need help quitting, ask your health care provider.  Avoid secondhand smoke.  Exercise regularly. Ask your health care provider about participating in a cardiac rehabilitation program that helps  you start exercising safely after a heart attack.  Eat a heart-healthy diet. Your health care provider will tell you what foods to eat.  Maintain a healthy weight.  Learn ways to manage stress.  Do not use illegal drugs. Alcohol use  Do not drink alcohol if: ? Your health care provider tells you not to drink. ? You are pregnant, may be pregnant, or are planning to become pregnant.  If you drink alcohol: ? Limit how much you use to:  0-1 drink a day for women.  0-2 drinks a day for men. ? Be aware of how much alcohol is in your drink. In the [place] one drink equals one 12 oz bottle of beer (355 mL), one 5 oz glass of wine (148 mL), or one 1 oz glass of hard liquor (44 mL). General instructions  Work with your health care provider to manage any other conditions you have, such as high blood pressure or diabetes. These conditions affect your heart.  Get screened for depression, and seek treatment if needed.  Keep your vaccinations up to date. Get the flu vaccine every year.  Keep all follow-up visits as told by your health care provider. This is important. Contact a health care provider if:  You feel overwhelmed or sad.  You have trouble doing your daily activities. Get help right away if:  You have sudden, unexplained discomfort in your chest, arms, back, neck, jaw, or upper body.  You have shortness of breath.  You suddenly start to sweat or your skin gets clammy.  You feel nauseous or you  vomit.  You have unexplained tiredness or weakness.  You suddenly feel light-headed or dizzy.  You notice your heart starts to beat fast or feels like it is skipping beats.  You have blood pressure that is higher than 180/120. These symptoms may represent a serious problem that is an emergency. Do not wait to see if the symptoms will go away. Get medical help right away. Call your local emergency services (911 in the [place] Do not drive yourself to the hospital. Summary  A heart attack, also called myocardial infarction, is a condition that occurs when your heart does not get enough oxygen. This is caused by anything that blocks or reduces blood flow to the heart.  Treatment is a combination of medicines and surgeries, if needed, to open the blocked arteries and restore blood flow to the heart.  A heart attack is an emergency. Get help right away if you have sudden discomfort in your chest, arms, back, neck, jaw, or upper body. Seek help if you feel nauseous, you vomit, or you feel light-headed or dizzy. This information is not intended to replace advice given to you by your health care provider. Make sure you discuss any questions you have with your health care provider. Document Released: 10/06/2005 Document Revised: 01/13/2019 Document Reviewed: 01/17/2019 Elsevier Patient Education  [place].   Exercise Guidelines During Cardiac Rehabilitation When you are recovering from a heart condition, such as from heart surgery, heart attack, or heart failure, it is important to have heart-healthy habits, including exercise routines. Discuss an appropriate exercise program with your heart specialist (cardiologist) and rehabilitation therapist. It is important to design a program that is safe and effective for you. The program should meet your specific abilities and needs. Walking, biking, jogging, and swimming are all good aerobic activities. These take light to moderate effort. Adding  some light resistance training is also important. Even simple lifestyle changes  can help. These lifestyle changes may include parking farther from the store or taking the stairs instead of the elevator. At first, you may begin exercising under supervision, such as at a hospital or clinic. Over time, you may begin exercising at home, with your health care provider's approval. Types of exercise Aerobic exercise During cardiac rehabilitation, it is important to do aerobic activities. Aerobic exercise keeps joints and muscles moving. It involves large muscle groups. It is also rhythmic and must be done for a longer period of time. Doing these exercises improves circulation and endurance. Examples of aerobic exercise include:  Swimming.  Walking.  Hiking.  Jogging.  Cross-country skiing.  Biking.  Dancing.  Static exercise Static exercise (isometric exercise) uses muscles at high intensities without moving the joints. Some examples of static exercise include pushing against a heavy couch that does not move, doing a wall sit, or holding a plank position. Static exercise improves strength but also quickly increases blood pressure. Follow these guidelines:  If you have circulation problems or high blood pressure, talk with your health care provider before starting any static exercise routines. Do not do static exercises if your health care provider tells you not to.  Do not hold your breath while doing static exercises. Holding your breath during static exercises can raise your blood pressure to a dangerously high level.  Weight-resistance exercise Weight-resistance exercises are another important part of rehabilitation. These exercises strengthen your muscles by making them work against resistance. Resistance exercises may help you return to activities of daily living sooner and improve your quality of life. They also help reduce cardiac risk factors. Examples of weight-resistance exercise  include using:  Free weights.  Weight-lifting machines.  Large, specially designed rubber bands. You will usually do weight-resistance exercises 2 times a week with a 2-day rest period between workouts. Stretching Stretching before you exercise warms up your muscles and prevents injury. Stretching also improves your flexibility, balance, coordination, and range of motion. Follow these guidelines:  Stretch both before and after exercising.  Do not force a muscle or joint into a painful angle. Stretching should be a relaxing part of your exercise routine.  Once you feel resistance in your muscle, hold the stretch for a few seconds. Make sure you keep breathing while you hold the stretch.  Go slowly when doing all stretches. Setting a pace  Choose a pace that is comfortable for you. ? You should be able to talk while exercising. If you are short of breath or unable to speak while you exercise, slow down. ? If you are able to sing while exercising, you are not exercising hard enough.  Keep track of how hard you are working as you exercise (exertion level). Your rehabilitation therapist can teach you to use a mental scale to measure your level of exertion (perceived exertion). Using a mental scale, you will think about your exertion level and rate it in a range from 6 to 20. ? A rating of 6 to 9. This means that you are doing "very light" exercise and are not exerting yourself enough. For a healthy person, this may be walking at a slow pace. ? A rating of 11 to 15. This is exercise that is "somewhat hard." For a healthy exercise session, you should aim for an exertion rate that is within this range. ? A rating of 16 to 17. This is considered "very hard" or strenuous. For a healthy person, exercise at this rating may start to feel heavy and  difficult. ? A rating of 19 to 20. This means that you are working "extremely hard." For most people, these numbers represent the hardest you've ever worked  to exercise.  Your health care provider or cardiac rehabilitation specialist may also recommend that you wear a heart rate monitor while you exercise. This will help you keep track of your heart rate zones and how hard your heart is working. Frequency As you are recovering, it is important to start exercising slowly and to gradually work up to your goal. Work with your health care provider to set up an exercise routine that works for you. Generally, cardiac rehabilitation exercise should include:  40 minutes of aerobic activity 3 - 4 days a week.  Stretching and strength exercises 2 - 3 days a week. Contact a doctor if:  You have any of the following symptoms while exercising: ? Pain, pressure, or burning in your chest, jaw, shoulder, or back (angina). ? Lightheadedness. ? Dizziness. ? Irregular or fast heartbeat. ? Shortness of breath.  You are extremely tired after exercising. Get help right away if:  You have angina that does not get better with medicine and lasts for more than 5 minutes.  You have nausea or you vomit.  You have excessive sweating that is not caused by exercise. Summary  When you are recovering from a heart condition, it is important to have heart-healthy habits, including exercise routines.  At first, you may begin exercising under supervision, such as at a hospital or clinic. Over time, you may begin exercising at home, with your health care provider's approval.  Aim for 40 minutes of aerobic exercises 3 - 4 days a week.  Aim to do stretching and strength exercises 2 - 3 days a week.  Choose a pace that is comfortable for you. You should be able to talk while exercising. This information is not intended to replace advice given to you by your health care provider. Make sure you discuss any questions you have with your health care provider. Document Released: 10/11/2013 Document Revised: 09/18/2017 Document Reviewed: 09/05/2016 Elsevier Patient Education   2020 [employer].   COVID-19 Frequently Asked Questions COVID-19 (coronavirus disease) is an infection that is caused by a large family of viruses. Some viruses cause illness in people and others cause illness in animals like camels, cats, and bats. In some cases, the viruses that cause illness in animals can spread to humans. Where did the coronavirus come from? In December 2019, [place] told the [facility]) of several cases of lung disease (human respiratory illness). These cases were linked to an open seafood and livestock market in the city of [place]. The link to the seafood and livestock market suggests that the virus may have spread from animals to humans. However, since that first outbreak in December, the virus has also been shown to spread from person to person. What is the name of the disease and the virus? Disease name Early on, this disease was called novel coronavirus. This is because scientists determined that the disease was caused by a new (novel) respiratory virus. The World Health Organization [facility] [facility]) has now named the disease COVID-19, or coronavirus disease. Virus name The virus that causes the disease is called severe acute respiratory syndrome coronavirus 2 (SARS-CoV-2). More information on disease and virus naming World Health Organization [facility]): www.who.int/emergencies/diseases/novel-coronavirus-2019/technical-guidance/naming-the-coronavirus-disease-(covid-2019)-and-the-virus-that-causes-it Who is at risk for complications from coronavirus disease? Some people may be at higher risk for complications from coronavirus disease. This includes older adults and  people who have chronic diseases, such as heart disease, diabetes, and lung disease. If you are at higher risk for complications, take these extra precautions:  Avoid close contact with people who are sick or have a fever or cough. Stay at least 3-6 ft (1-2 m) away from them, if possible.  Wash your hands  often with soap and water for at least 20 seconds.  Avoid touching your face, mouth, nose, or eyes.  Keep supplies on hand at home, such as food, medicine, and cleaning supplies.  Stay home as much as possible.  Avoid social gatherings and travel. How does coronavirus disease spread? The virus that causes coronavirus disease spreads easily from person to person (is contagious). There are also cases of community-spread disease. This means the disease has spread to:  People who have no known contact with other infected people.  People who have not traveled to areas where there are known cases. It appears to spread from one person to another through droplets from coughing or sneezing. Can I get the virus from touching surfaces or objects? There is still a lot that we do not know about the virus that causes coronavirus disease. Scientists are basing a lot of information on what they know about similar viruses, such as:  Viruses cannot generally survive on surfaces for long. They need a human body (host) to survive.  It is more likely that the virus is spread by close contact with people who are sick (direct contact), such as through: ? Shaking hands or hugging. ? Breathing in respiratory droplets that travel through the air. This can happen when an infected person coughs or sneezes on or near other people.  It is less likely that the virus is spread when a person touches a surface or object that has the virus on it (indirect contact). The virus may be able to enter the body if the person touches a surface or object and then touches his or her face, eyes, nose, or mouth. Can a person spread the virus without having symptoms of the disease? It may be possible for the virus to spread before a person has symptoms of the disease, but this is most likely not the main way the virus is spreading. It is more likely for the virus to spread by being in close contact with people who are sick and breathing  in the respiratory droplets of a sick person's cough or sneeze. What are the symptoms of coronavirus disease? Symptoms vary from person to person and can range from mild to severe. Symptoms may include:  Fever.  Cough.  Tiredness, weakness, or fatigue.  Fast breathing or feeling short of breath. These symptoms can appear anywhere from 2 to 14 days after you have been exposed to the virus. If you develop symptoms, call your health care provider. People with severe symptoms may need hospital care. If I am exposed to the virus, how long does it take before symptoms start? Symptoms of coronavirus disease may appear anywhere from 2 to 14 days after a person has been exposed to the virus. If you develop symptoms, call your health care provider. Should I be tested for this virus? Your health care provider will decide whether to test you based on your symptoms, history of exposure, and your risk factors. How does a health care provider test for this virus? Health care providers will collect samples to send for testing. Samples may include:  Taking a swab of fluid from the nose.  Taking fluid  from the lungs by having you cough up mucus (sputum) into a sterile cup.  Taking a blood sample.  Taking a stool or urine sample. Is there a treatment or vaccine for this virus? Currently, there is no vaccine to prevent coronavirus disease. Also, there are no medicines like antibiotics or antivirals to treat the virus. A person who becomes sick is given supportive care, which means rest and fluids. A person may also relieve his or her symptoms by using over-the-counter medicines that treat sneezing, coughing, and runny nose. These are the same medicines that a person takes for the common cold. If you develop symptoms, call your health care provider. People with severe symptoms may need hospital care. What can I do to protect myself and my family from this virus?     You can protect yourself and your  family by taking the same actions that you would take to prevent the spread of other viruses. Take the following actions:  Wash your hands often with soap and water for at least 20 seconds. If soap and water are not available, use alcohol-based hand sanitizer.  Avoid touching your face, mouth, nose, or eyes.  Cough or sneeze into a tissue, sleeve, or elbow. Do not cough or sneeze into your hand or the air. ? If you cough or sneeze into a tissue, throw it away immediately and wash your hands.  Disinfect objects and surfaces that you frequently touch every day.  Avoid close contact with people who are sick or have a fever or cough. Stay at least 3-6 ft (1-2 m) away from them, if possible.  Stay home if you are sick, except to get medical care. Call your health care provider before you get medical care.  Make sure your vaccines are up to date. Ask your health care provider what vaccines you need. What should I do if I need to travel? Follow travel recommendations from your local health authority, the CDC, and WHO. Travel information and advice  Centers for Disease Control and Prevention (CDC): BodyEditor.hu  World Health Organization [facility]): ThirdIncome.ca Know the risks and take action to protect your health  You are at higher risk of getting coronavirus disease if you are traveling to areas with an outbreak or if you are exposed to travelers from areas with an outbreak.  Wash your hands often and practice good hygiene to lower the risk of catching or spreading the virus. What should I do if I am sick? General instructions to stop the spread of infection  Wash your hands often with soap and water for at least 20 seconds. If soap and water are not available, use alcohol-based hand sanitizer.  Cough or sneeze into a tissue, sleeve, or elbow. Do not cough or sneeze into your hand or the  air.  If you cough or sneeze into a tissue, throw it away immediately and wash your hands.  Stay home unless you must get medical care. Call your health care provider or local health authority before you get medical care.  Avoid public areas. Do not take public transportation, if possible.  If you can, wear a mask if you must go out of the house or if you are in close contact with someone who is not sick. Keep your home clean  Disinfect objects and surfaces that are frequently touched every day. This may include: ? Counters and tables. ? Doorknobs and light switches. ? Sinks and faucets. ? Electronics such as phones, remote controls, keyboards, computers, and tablets.  Wash dishes in hot, soapy water or use a dishwasher. Air-dry your dishes.  Wash laundry in hot water. Prevent infecting other household members  Let healthy household members care for children and pets, if possible. If you have to care for children or pets, wash your hands often and wear a mask.  Sleep in a different bedroom or bed, if possible.  Do not share personal items, such as razors, toothbrushes, deodorant, combs, brushes, towels, and washcloths. Where to find more information Centers for Disease Control and Prevention (CDC)  Information and news updates: https://www.butler-gonzalez.com/ World Health Organization [facility])  Information and news updates: MissExecutive.com.ee  Coronavirus health topic: https://www.castaneda.info/  Questions and answers on COVID-19: OpportunityDebt.at  Global tracker: who.sprinklr.com American Academy of Pediatrics (AAP)  Information for families: www.healthychildren.org/English/health-issues/conditions/chest-lungs/Pages/2019-Novel-Coronavirus.aspx The coronavirus situation is changing rapidly. Check your local health authority website or the CDC and [facility] websites for updates and news. When should  I contact a health care provider?  Contact your health care provider if you have symptoms of an infection, such as fever or cough, and you: ? Have been near anyone who is known to have coronavirus disease. ? Have come into contact with a person who is suspected to have coronavirus disease. ? Have traveled outside of the country. When should I get emergency medical care?  Get help right away by calling your local emergency services (911 in the [place] if you have: ? Trouble breathing. ? Pain or pressure in your chest. ? Confusion. ? Blue-tinged lips and fingernails. ? Difficulty waking from sleep. ? Symptoms that get worse. Let the emergency medical personnel know if you think you have coronavirus disease. Summary  A new respiratory virus is spreading from person to person and causing COVID-19 (coronavirus disease).  The virus that causes COVID-19 appears to spread easily. It spreads from one person to another through droplets from coughing or sneezing.  Older adults and those with chronic diseases are at higher risk of disease. If you are at higher risk for complications, take extra precautions.  There is currently no vaccine to prevent coronavirus disease. There are no medicines, such as antibiotics or antivirals, to treat the virus.  You can protect yourself and your family by washing your hands often, avoiding touching your face, and covering your coughs and sneezes. This information is not intended to replace advice given to you by your health care provider. Make sure you discuss any questions you have with your health care provider. Document Released: 02/01/2019 Document Revised: 02/01/2019 Document Reviewed: 02/01/2019 Elsevier Patient Education  [employer].

## 2019-10-05 NOTE — Progress Notes (Signed)
Pulse oximetry on room air is 96%. Pulse oximetry on room air while walking around stayed between 93%-97%.

## 2019-10-24 ENCOUNTER — Telehealth: Payer: Self-pay | Admitting: Cardiovascular Disease

## 2019-10-24 NOTE — Telephone Encounter (Signed)
Patient asks when he will be able to return to work.

## 2019-10-24 NOTE — Telephone Encounter (Signed)
To Dr. Fletcher Anon to review. The patient was cathed on 09/30/19. No follow up appointment with Cardiology is currently scheduled.

## 2019-10-26 NOTE — Telephone Encounter (Signed)
He can likely resume work next week but he should be seen in our office before then.  Please schedule him an appointment as soon as possible with me or APP.

## 2019-10-27 ENCOUNTER — Encounter: Payer: Self-pay | Admitting: *Deleted

## 2019-10-27 ENCOUNTER — Ambulatory Visit (INDEPENDENT_AMBULATORY_CARE_PROVIDER_SITE_OTHER): Payer: 59 | Admitting: Family

## 2019-10-27 ENCOUNTER — Encounter: Payer: Self-pay | Admitting: Family

## 2019-10-27 ENCOUNTER — Other Ambulatory Visit: Payer: Self-pay

## 2019-10-27 VITALS — BP 102/60 | HR 74 | Temp 99.0°F | Ht 74.0 in | Wt 197.0 lb

## 2019-10-27 DIAGNOSIS — E782 Mixed hyperlipidemia: Secondary | ICD-10-CM

## 2019-10-27 DIAGNOSIS — J849 Interstitial pulmonary disease, unspecified: Secondary | ICD-10-CM | POA: Diagnosis not present

## 2019-10-27 DIAGNOSIS — I2510 Atherosclerotic heart disease of native coronary artery without angina pectoris: Secondary | ICD-10-CM

## 2019-10-27 MED ORDER — NITROGLYCERIN 0.3 MG SL SUBL: 0.3000 mg | SUBLINGUAL_TABLET | SUBLINGUAL | 6 refills | Status: DC | PRN

## 2019-10-27 MED ORDER — TICAGRELOR 90 MG PO TABS: 90.0000 mg | ORAL_TABLET | Freq: Two times a day (BID) | ORAL | 2 refills | Status: DC

## 2019-10-27 MED ORDER — CARVEDILOL 3.125 MG PO TABS: 3.1250 mg | ORAL_TABLET | Freq: Two times a day (BID) | ORAL | 2 refills | Status: DC

## 2019-10-27 NOTE — Progress Notes (Signed)
Office Visit    Patient Name: Dillon [name] Date of Encounter: 10/27/2019  Primary Care Provider:  [name], MD Primary Cardiologist:  [name], MD Electrophysiologist:  None   Chief Complaint    Dillon [name] is a 64 y.o. male with a hx of CAD, HLD, ILD presents today for follow-up after STEMI and cardiac catheterization  Past Medical History    Past Medical History:  Diagnosis Date  . Coronary artery disease   . Elevated cholesterol   . Restless leg syndrome    Past Surgical History:  Procedure Laterality Date  . ANGIOPLASTY  2010   2 stents placed  . BACK SURGERY    . COLONOSCOPY WITH PROPOFOL N/A 03/05/2018   Procedure: COLONOSCOPY WITH PROPOFOL;  Surgeon: Virgel Manifold, MD;  Location: [facility];  Service: Endoscopy;  Laterality: N/A;  . CORONARY ANGIOPLASTY    . CORONARY/GRAFT ACUTE MI REVASCULARIZATION N/A 09/30/2019   Procedure: Coronary/Graft Acute MI Revascularization;  Surgeon: Wellington Hampshire, MD;  Location: [place] [facility];  Service: Cardiovascular;  Laterality: N/A;  . LEFT HEART CATH AND CORONARY ANGIOGRAPHY N/A 09/30/2019   Procedure: LEFT HEART CATH AND CORONARY ANGIOGRAPHY;  Surgeon: Wellington Hampshire, MD;  Location: [place] [facility];  Service: Cardiovascular;  Laterality: N/A;    Allergies  Allergies  Allergen Reactions  . Ace Inhibitors Swelling    History of Present Illness    Dillon [name] is a 64 y.o. male with a hx of CAD, HLD, ILD last seen while hospitalized.  He was admitted 09/30/2019 for chest pain and diagnosed a STEMI.  Cardiac catheterization with severe two vessel disease; occluded apical LAD that wraps around apex supplying inferior wall, occlusion was distal and flow was etablished with wiring the lesion, the occluded apical LAD 80% is s/p DES.  Recommended for ASA and Brilinta for at least 12 months.  Inflammatory markers elevated normal started on remdesivir and steroids for COVID-19.  He was Covid +  09/23/2019.  Dillon [name] is a very pleasant gentleman. He reports feeling well since hospital discharge.  He denies chest pain, pressure, tightness.  Reports that his DOE and cough is at his baseline with his ILD.  Works as a Insurance account manager. 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  Tells me he is only planning to go back to 1 of these 2 roles.  Requests permission to return to work and I believe this is appropriate.  BP at home has been 120/60.  Denies lightheadedness, dizziness.  He questions whether he will need to remain on Coreg and Brilinta.  We discussed the role of Coreg in cardioprotection and the role of Brilinta and protection of his stent.  He is agreeable to continue both medications.  Endorses eating a low-sodium diet.  Tells me one of his daughters recently purchased him an air fryer and he is enjoying using that to cook.  Endorses not having a formal exercise regimen, encouraged him to begin walking.  We discussed cardiac rehab and he politely declines.  EKGs/Labs/Other Studies Reviewed:   The following studies were reviewed today:  Cardiac catheterization 09/30/2019  Mid Cx lesion is 30% stenosed.  Prox LAD lesion is 80% stenosed.  Post intervention, there is a 0% residual stenosis.  A drug-eluting stent was successfully placed using a STENT RESOLUTE ONYX 3.5X12.  1st Diag lesion is 40% stenosed.  RPDA lesion is 10% stenosed.  Dist RCA lesion is 10%  stenosed.  Prox RCA to Mid RCA lesion is 30% stenosed.  The left ventricular systolic function is normal.  LV end diastolic pressure is normal.  Dist LAD lesion is 100% stenosed.   1.  Significant underlying two-vessel coronary artery disease with patent distal RCA stent with no significant restenosis.  Occluded apical LAD is likely the culprit for ST elevation myocardial infarction.  In addition, there is an 80% eccentric complex plaque in the proximal LAD which likely was responsible for  distal embolization. 2.  Normal LV systolic function with severe apical hypokinesis.  LVEDP was normal at 10 mmHg. 3.  Successful direct stenting of the proximal LAD and establishing flow in the apical LAD just by wiring the lesion and giving antiplatelet and anticoagulant medications.   Recommendations: Continue dual antiplatelet therapy with aspirin and ticagrelor for at least 1 year. Aggressive treatment of risk factors. Lovenox for DVT prophylaxis. Treatment of Covid per internal medicine.  EKG:  EKG is  ordered today.  The ekg ordered today demonstrates sinus rhythm 74 bpm with PVC and T wave inversion in lead III.  Recent Labs: 10/01/2019: ALT 84 10/02/2019: BUN 16; Creatinine, Ser 0.64; Hemoglobin 12.7; Platelets 232; Potassium 4.2; Sodium 134  Recent Lipid Panel    Component Value Date/Time   CHOL 103 10/02/2019 0331   TRIG 30 10/02/2019 0331   HDL 33 (L) 10/02/2019 0331   CHOLHDL 3.1 10/02/2019 0331   VLDL 6 10/02/2019 0331   LDLCALC 64 10/02/2019 0331    Home Medications   Current Meds  Medication Sig  . albuterol (VENTOLIN HFA) 108 (90 Base) MCG/ACT inhaler Inhale 1 puff into the lungs every 6 (six) hours.  Marland Kitchen ascorbic acid (VITAMIN C) 500 MG tablet Take 1 tablet (500 mg total) by mouth daily.  . Aspirin Buf,CaCarb-MgCarb-MgO, 81 MG TABS Take 1 tablet by mouth daily.  Marland Kitchen atorvastatin (LIPITOR) 40 MG tablet Take 40 mg by mouth daily.  . carvedilol (COREG) 3.125 MG tablet Take 1 tablet (3.125 mg total) by mouth 2 (two) times daily with a meal.  . enalapril (VASOTEC) 5 MG tablet Take 5 mg by mouth daily.  . feeding supplement, ENSURE ENLIVE, (ENSURE ENLIVE) LIQD Take 237 mLs by mouth 3 (three) times daily between meals.  . nitroGLYCERIN (NITROSTAT) 0.3 MG SL tablet Place 0.3 mg under the tongue every 5 (five) minutes as needed for chest pain.  . polyethylene glycol (MIRALAX / GLYCOLAX) packet Take 17 g by mouth daily.  . ticagrelor (BRILINTA) 90 MG TABS tablet Take 1  tablet (90 mg total) by mouth 2 (two) times daily.  Marland Kitchen zinc sulfate 220 (50 Zn) MG capsule Take 1 capsule (220 mg total) by mouth daily.    Review of Systems    Review of Systems  Constitution: Negative for chills, fever and malaise/fatigue.  Cardiovascular: Positive for dyspnea on exertion. Negative for chest pain, leg swelling, near-syncope, orthopnea, palpitations and syncope.  Respiratory: Positive for cough. Negative for shortness of breath and wheezing.   Gastrointestinal: Negative for nausea and vomiting.  Neurological: Negative for dizziness, light-headedness and weakness.   All other systems reviewed and are otherwise negative except as noted above.  Physical Exam    VS:  BP 102/60 (BP Location: Left Arm, Patient Position: Sitting, Cuff Size: Normal)   Pulse 74   Temp 99 F (37.2 C)   Ht 6\' 2"  (1.88 m)   Wt 197 lb (89.4 kg)   BMI 25.29 kg/m  , BMI Body mass index is  25.29 kg/m. GEN: Well nourished, well developed, in no acute distress. HEENT: normal. Neck: Supple, no JVD, carotid bruits, or masses. Cardiac: RRR, no murmurs, rubs, or gallops. No clubbing, cyanosis, edema.  Radials/PT 2+ and equal bilaterally.  Respiratory:  Respirations regular and unlabored. Bilateral upper lobes clear to auscultation. Bilateral lower lobes with rhonchi.  GI: Soft, nontender, nondistended. MS: No deformity or atrophy. Skin: Warm and dry, no rash. R radial cath site clean, dry, intact with minimal yellowed ecchymosis.  Neuro:  Strength and sensation are intact. Psych: Normal affect.  Accessory Clinical Findings    ECG personally reviewed by me today - SR 74 bpm with PVC and t-wave inversion lead III - no acute changes.  Assessment & Plan    1. CAD - Stable with no anginal symptoms. 09/30/19 STEMI s/p DES to LAD. R radial cath site healed appropriately. Recommended for DAPT with Brilinta/Aspirin for 12 months. GDMT of DAPT Brilinta/Aspirin, Coreg, Lipitor, PRN Nitroglycerin. He  politely declines cardiac rehab. Encouraged continue low sodium, heart healthy diet. Encouraged to start regular cardiovascular exercise. 2. HLD - Most recent LDL 64. Continue Lipitor 40.   3. ILD - Follows with his PCP. Reports cough/DOE at baseline. 4. COVID19 - Positive PCR 09/23/19. Treated inpatient with Remdesivir and steroids. No noted lingering symptoms.   Disposition: Follow up in 3 month(s) with Dr. Fletcher Anon or APP.    Loel Dubonnet, NP 10/27/2019, 8:25 AM

## 2019-10-27 NOTE — Patient Instructions (Signed)
Medication Instructions:  No changes  *If you need a refill on your cardiac medications before your next appointment, please call your pharmacy*  Lab Work: No changes  If you have labs (blood work) drawn today and your tests are completely normal, you will receive your results only by: Marland Kitchen MyChart Message (if you have MyChart) OR . A paper copy in the mail If you have any lab test that is abnormal or we need to change your treatment, we will call you to review the results.  Testing/Procedures: No changes  Follow-Up: At [facility], you and your health needs are our priority.  As part of our continuing mission to provide you with exceptional heart care, we have created designated Provider Care Teams.  These Care Teams include your primary Cardiologist (physician) and Advanced Practice Providers (APPs -  Physician Assistants and Nurse Practitioners) who all work together to provide you with the care you need, when you need it.  Your next appointment:   3 month(s)  The format for your next appointment:   Either In Person or Virtual  Provider:    You may see [name], MD or one of the following Advanced Practice Providers on your designated Care Team:    Murray Hodgkins, NP  Christell Faith, PA-C  Marrianne Mood, PA-C

## 2019-11-25 ENCOUNTER — Other Ambulatory Visit: Payer: Self-pay | Admitting: Specialist

## 2019-11-25 DIAGNOSIS — J849 Interstitial pulmonary disease, unspecified: Secondary | ICD-10-CM

## 2019-11-28 ENCOUNTER — Other Ambulatory Visit: Payer: Self-pay

## 2019-11-28 DIAGNOSIS — I2510 Atherosclerotic heart disease of native coronary artery without angina pectoris: Secondary | ICD-10-CM

## 2019-11-28 MED ORDER — NITROGLYCERIN 0.3 MG SL SUBL: 0.3000 mg | SUBLINGUAL_TABLET | SUBLINGUAL | 3 refills | Status: DC | PRN

## 2019-12-02 ENCOUNTER — Other Ambulatory Visit: Payer: Self-pay

## 2019-12-02 ENCOUNTER — Ambulatory Visit
Admission: RE | Admit: 2019-12-02 | Discharge: 2019-12-02 | Disposition: A | Discharge status: Home / Self Care | Payer: 59 | Source: Ambulatory Visit | Attending: Specialist | Admitting: Specialist

## 2019-12-02 DIAGNOSIS — J849 Interstitial pulmonary disease, unspecified: Secondary | ICD-10-CM | POA: Diagnosis present

## 2020-01-26 ENCOUNTER — Ambulatory Visit: Payer: 59 | Admitting: Cardiovascular Disease

## 2020-01-27 ENCOUNTER — Encounter: Payer: Self-pay | Admitting: Cardiovascular Disease

## 2020-01-31 ENCOUNTER — Other Ambulatory Visit: Payer: Self-pay

## 2020-01-31 ENCOUNTER — Encounter: Payer: Self-pay | Admitting: Physician Assistant

## 2020-01-31 ENCOUNTER — Ambulatory Visit (INDEPENDENT_AMBULATORY_CARE_PROVIDER_SITE_OTHER): Payer: 59 | Admitting: Physician Assistant

## 2020-01-31 VITALS — BP 140/80 | HR 62 | Ht 74.0 in | Wt 205.0 lb

## 2020-01-31 DIAGNOSIS — Z87891 Personal history of nicotine dependence: Secondary | ICD-10-CM

## 2020-01-31 DIAGNOSIS — I10 Essential (primary) hypertension: Secondary | ICD-10-CM

## 2020-01-31 DIAGNOSIS — E785 Hyperlipidemia, unspecified: Secondary | ICD-10-CM

## 2020-01-31 DIAGNOSIS — J849 Interstitial pulmonary disease, unspecified: Secondary | ICD-10-CM

## 2020-01-31 DIAGNOSIS — Z8616 Personal history of COVID-19: Secondary | ICD-10-CM

## 2020-01-31 DIAGNOSIS — I2510 Atherosclerotic heart disease of native coronary artery without angina pectoris: Secondary | ICD-10-CM

## 2020-01-31 DIAGNOSIS — I252 Old myocardial infarction: Secondary | ICD-10-CM

## 2020-01-31 NOTE — Progress Notes (Signed)
Office Visit    Patient Name: Dillon [name] Date of Encounter: 01/31/2020  Primary Care Provider:  [name], MD Primary Cardiologist:  [name], MD  Chief Complaint    Chief Complaint  Patient presents with   office visit    3 month F/U; Meds verbally reviewed with patient.    64 yo male with PMH of CAD, HLD, ILD, and presenting today for 3 month follow-up after STEMI and cardiac catheterization.   Past Medical History    Past Medical History:  Diagnosis Date   Coronary artery disease    Elevated cholesterol    Restless leg syndrome    Past Surgical History:  Procedure Laterality Date   ANGIOPLASTY  2010   2 stents placed   BACK SURGERY     COLONOSCOPY WITH PROPOFOL N/A 03/05/2018   Procedure: COLONOSCOPY WITH PROPOFOL;  Surgeon: Virgel Manifold, MD;  Location: [facility];  Service: Endoscopy;  Laterality: N/A;   CORONARY ANGIOPLASTY     CORONARY/GRAFT ACUTE MI REVASCULARIZATION N/A 09/30/2019   Procedure: Coronary/Graft Acute MI Revascularization;  Surgeon: Wellington Hampshire, MD;  Location: [place] [facility];  Service: Cardiovascular;  Laterality: N/A;   LEFT HEART CATH AND CORONARY ANGIOGRAPHY N/A 09/30/2019   Procedure: LEFT HEART CATH AND CORONARY ANGIOGRAPHY;  Surgeon: Wellington Hampshire, MD;  Location: [place] [facility];  Service: Cardiovascular;  Laterality: N/A;    Allergies  Allergies  Allergen Reactions   Ace Inhibitors Swelling    History of Present Illness    Dillon [name] is a 64 y.o. male with PMH as above. He was admitted 09/30/2019 with STEMI and subsequent LHC showed severe 2v CAD with occluded apical LAD to supply the inferior wall, and which was thought likely to be the culprit of his STEMI. An 80% eccentric complex plaque was noted to be in the pLAD and thought responsible for the more distal embolization. Successful DES was performed of the pLAD and wiring restored flow to the distal occlusion. Recommendation  was for DAPT for at least 12 months. Given his positive COVID-19 status on 09/23/2019, he was continued on treatment with remdesivir and steriods. He was seen at follow-up 1/7/20201 and noted to be doing well from a cardiac standpoint. He reported stable DOE and cough with respect to his known ILD. He was, at that time, working 2 part time jobs with plan to soon stop work at the second. He noted BP at home 120/60. He was using an air fryer. Cardiac rehab and increased activity as tolerated was discussed.  He presents today and reports that he is doing well from a cardiac standpoint. 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 No reported recent CP, racing HR, palpitation, presyncope, or syncope. He reports stable DOE and improved cough when compared with his previous 10/27/19 visit. He is joined today by his wife, and they report that they have both had COVID-19 and thus quarantined and are glad to be out of quarantine, though now their children are also under quarantine 2/2 COVID-19. No abdominal distention or s/sx of worsening heart failure. Increased weight was noted from that of previous weight with patient reporting a healthy appetite and likely increase of weight 2/2 caloric intake. Diet and activity were discussed in detail with recommendations reviewed as below. Medication compliance was reported and  no s/sx of bleeding on DAPT. Also of note, it was confirmed that ACEi was discontinued in the past 2/2 cough, given elevated BP at 140/80 today.  Home Medications    Prior to Admission medications   Medication Sig Start Date End Date Taking? Authorizing Provider  albuterol (VENTOLIN HFA) 108 (90 Base) MCG/ACT inhaler Inhale 1 puff into the lungs every 6 (six) hours. 10/05/19  Yes Nicole Kindred A, DO  ANORO ELLIPTA 62.5-25 MCG/INH AEPB Inhale 1 puff into the  lungs daily. 11/24/19  Yes [provider]  Aspirin Buf,CaCarb-MgCarb-MgO, 81 MG TABS Take 1 tablet by mouth daily. 07/05/09  Yes [provider]  atorvastatin (LIPITOR) 40 MG tablet Take 40 mg by mouth daily. 06/10/18  Yes [provider]  carvedilol (COREG) 3.125 MG tablet Take 1 tablet (3.125 mg total) by mouth 2 (two) times daily with a meal. 10/27/19  Yes Loel Dubonnet, NP  feeding supplement, ENSURE ENLIVE, (ENSURE ENLIVE) LIQD Take 237 mLs by mouth 3 (three) times daily between meals. Patient taking differently: Take 237 mLs by mouth daily.  10/05/19  Yes Nicole Kindred A, DO  nitroGLYCERIN (NITROSTAT) 0.3 MG SL tablet Place 1 tablet (0.3 mg total) under the tongue every 5 (five) minutes as needed for chest pain. 11/28/19  Yes Loel Dubonnet, NP  ticagrelor (BRILINTA) 90 MG TABS tablet Take 1 tablet (90 mg total) by mouth 2 (two) times daily. 10/27/19  Yes Loel Dubonnet, NP  ascorbic acid (VITAMIN C) 500 MG tablet Take 1 tablet (500 mg total) by mouth daily. 10/06/19   Nicole Kindred A, DO  enalapril (VASOTEC) 5 MG tablet Take 5 mg by mouth daily. 08/30/19   [provider]  polyethylene glycol (MIRALAX / GLYCOLAX) packet Take 17 g by mouth daily.    Virgel Manifold, MD  zinc sulfate 220 (50 Zn) MG capsule Take 1 capsule (220 mg total) by mouth daily. 10/06/19   Ezekiel Slocumb, DO    Review of Systems    He denies chest pain, palpitations, pnd, orthopnea, n, v, dizziness, syncope, edema, weight gain, or early satiety. He reports stable DOE and improved cough with d/c'd ACEi.   All other systems reviewed and are otherwise negative except as noted above.   Physical Exam    VS:  BP 140/80 (BP Location: Left Arm, Patient Position: Sitting, Cuff Size: Normal)    Pulse 62    Ht 6\' 2"  (1.88 m)    Wt 205 lb (93 kg)    SpO2 94%    BMI 26.32 kg/m  , BMI Body mass index is 26.32 kg/m. GEN: Well nourished, well developed, in no acute distress. HEENT:  normal. Neck: Supple, no JVD, carotid bruits, or masses. Cardiac: RRR, no murmurs, rubs, or gallops. No clubbing, cyanosis. Trace bilateral LE edema.  Radials/DP/PT 2+ and equal bilaterally.  Respiratory: Coarse bilateral breath sounds with scattered bilateral rhonchi. GI: Soft, nontender,nondistended, BS + x 4. MS: no deformity or atrophy. Skin: warm and dry, no rash. Neuro:  Strength and sensation are intact. Psych: Normal affect.  Accessory Clinical Findings    ECG personally reviewed by me today - NSR 62bpm, borderline IVCD QRS 74ms, poor R wave progression in inferior leads - no acute changes.  VITALS Reviewed today   Temp Readings from Last 3 Encounters:  10/27/19 99 F (37.2 C)  10/05/19 98.2 F (36.8 C) (Oral)  03/05/18 (!) 97 F (36.1 C) (Tympanic)   BP Readings from Last 3 Encounters:  01/31/20 140/80  10/27/19 102/60  10/05/19 120/69   Pulse Readings from Last 3 Encounters:  01/31/20 62  10/27/19 74  10/05/19 63    Wt Readings from Last 3 Encounters:  01/31/20 205 lb (93 kg)  10/27/19 197 lb (89.4 kg)  09/30/19 205 lb (93 kg)     LABS  reviewed today   CareEverwhere Labs present? Yes/No: No  Lab Results  Component Value Date   WBC 8.9 10/02/2019   HGB 12.7 (L) 10/02/2019   HCT 36.1 (L) 10/02/2019   MCV 90.5 10/02/2019   PLT 232 10/02/2019   Lab Results  Component Value Date   CREATININE 0.64 10/02/2019   BUN 16 10/02/2019   NA 134 (L) 10/02/2019   K 4.2 10/02/2019   CL 107 10/02/2019   CO2 20 (L) 10/02/2019   Lab Results  Component Value Date   ALT 84 (H) 10/01/2019   AST 74 (H) 10/01/2019   ALKPHOS 136 (H) 10/01/2019   BILITOT 1.1 10/01/2019   Lab Results  Component Value Date   CHOL 103 10/02/2019   HDL 33 (L) 10/02/2019   LDLCALC 64 10/02/2019   TRIG 30 10/02/2019   CHOLHDL 3.1 10/02/2019    No results found for: HGBA1C No results found for: TSH   STUDIES/PROCEDURES reviewed today   LHC 09/30/2019  Mid Cx lesion is 30%  stenosed.  Prox LAD lesion is 80% stenosed.  Post intervention, there is a 0% residual stenosis.  A drug-eluting stent was successfully placed using a STENT RESOLUTE ONYX 3.5X12.  1st Diag lesion is 40% stenosed.  RPDA lesion is 10% stenosed.  Dist RCA lesion is 10% stenosed.  Prox RCA to Mid RCA lesion is 30% stenosed.  The left ventricular systolic function is normal.  LV end diastolic pressure is normal.  Dist LAD lesion is 100% stenosed. 1.  Significant underlying two-vessel coronary artery disease with patent distal RCA stent with no significant restenosis.  Occluded apical LAD is likely the culprit for ST elevation myocardial infarction.  In addition, there is an 80% eccentric complex plaque in the proximal LAD which likely was responsible for distal embolization. 2.  Normal LV systolic function with severe apical hypokinesis.  LVEDP was normal at 10 mmHg. 3.  Successful direct stenting of the proximal LAD and establishing flow in the apical LAD just by wiring the lesion and giving antiplatelet and anticoagulant medications. Recommendations: Continue dual antiplatelet therapy with aspirin and ticagrelor for at least 1 year. Aggressive treatment of risk factors. Lovenox for DVT prophylaxis. Treatment of Covid per internal medicine.   Assessment & Plan    CAD with history of inferior STEMI 09/2019 --No current CP. LHC as above with occluded apical LAD that wrapped around the apex to supply the inferior wall with DES of pLAD and wiring restoring flow. Continue medical management with DAPT, Coreg, and Lipitor. Discussed ongoing lifestyle changes, including diet and exercise.   HTN --BP elevated today with patient reporting enalopril has been discontinued 2/2 cough with improvement in cough since that time. If BP elevated at RTC, recommend consider alternative antihypertensive with options reviewed at length with patient during visit today.   HLD --Continue statin. Most  recent LDL at goal.  ILD --Continue scheduled follow-up. Reports DOE at baseline.  History of recent COVID-19 --Reports recovered and without lingering sx s/p positive PCR 09/23/2019 and subsequent treatment with remdesivir and steroids. Cough has reportedly improved, though attributed to d/c'd enalopril 5mg  daily. BP today elevated as above. Followed  by pulmonology for ILD with most recent visit noting mild progression and  deferred lung transplant.    History of smoking --Recommend continue to abstain from tobacco use. Congratulated on ongoing cessation.    Medication changes: None Labs ordered: None Studies / Imaging ordered: None Future considerations: Echo, ARB (though with consideration of cough with ACE) Disposition: RTC 6 months    Arvil Chaco, PA-C 01/31/2020

## 2020-01-31 NOTE — Patient Instructions (Signed)
Medication Instructions:  Your physician recommends that you continue on your current medications as directed. Please refer to the Current Medication list given to you today.  *If you need a refill on your cardiac medications before your next appointment, please call your pharmacy*   Follow-Up: At [facility], you and your health needs are our priority.  As part of our continuing mission to provide you with exceptional heart care, we have created designated Provider Care Teams.  These Care Teams include your primary Cardiologist (physician) and Advanced Practice Providers (APPs -  Physician Assistants and Nurse Practitioners) who all work together to provide you with the care you need, when you need it.  We recommend signing up for the patient portal called "MyChart".  Sign up information is provided on this After Visit Summary.  MyChart is used to connect with patients for Virtual Visits (Telemedicine).  Patients are able to view lab/test results, encounter notes, upcoming appointments, etc.  Non-urgent messages can be sent to your provider as well.   To learn more about what you can do with MyChart, go to [email or web address].    Your next appointment:   6 month(s)  The format for your next appointment:   In Person  Provider:    You may see Muhammad Arida, MD or one of the following Advanced Practice Providers on your designated Care Team:    Christopher Berge, NP  Ryan Dunn, PA-C  Jacquelyn Visser, PA-C   

## 2020-03-05 ENCOUNTER — Ambulatory Visit: Payer: 59 | Admitting: Internal Medicine

## 2020-03-05 ENCOUNTER — Other Ambulatory Visit: Payer: Self-pay

## 2020-03-06 ENCOUNTER — Other Ambulatory Visit: Payer: Self-pay | Admitting: *Deleted

## 2020-03-06 ENCOUNTER — Telehealth: Payer: Self-pay | Admitting: *Deleted

## 2020-03-06 MED ORDER — ALBUTEROL SULFATE HFA 108 (90 BASE) MCG/ACT IN AERS: 1.0000 | INHALATION_SPRAY | Freq: Four times a day (QID) | RESPIRATORY_TRACT | 3 refills | Status: DC

## 2020-03-06 NOTE — Telephone Encounter (Signed)
Patient called, no answer. Left message on voicemail to return call regarding repeat pneumonia vaccination.

## 2020-03-09 ENCOUNTER — Ambulatory Visit (INDEPENDENT_AMBULATORY_CARE_PROVIDER_SITE_OTHER): Payer: 59 | Admitting: Internal Medicine

## 2020-03-09 ENCOUNTER — Other Ambulatory Visit: Payer: Self-pay

## 2020-03-09 ENCOUNTER — Encounter: Payer: Self-pay | Admitting: Internal Medicine

## 2020-03-09 VITALS — BP 131/86 | HR 75 | Wt 202.6 lb

## 2020-03-09 DIAGNOSIS — F5221 Male erectile disorder: Secondary | ICD-10-CM

## 2020-03-09 DIAGNOSIS — M67912 Unspecified disorder of synovium and tendon, left shoulder: Secondary | ICD-10-CM | POA: Diagnosis not present

## 2020-03-09 NOTE — Progress Notes (Signed)
Established Patient Office Visit  Subjective:  Patient ID: Dillon [name], male    DOB: [birth year]  Age: 64 y.o. MRN: [id number]  CC:  Chief Complaint  Patient presents with  . Shoulder Pain    left shoulder pain- patient here for steroid injection     HPI  Dillon [name] presents for pain in the left shoulder.  He cannot raise the left arm above the shoulder and is painful to do yard work.  He denies any history of chest pain.  .  Patient also complaining of sexual dysfunction wants some help with that.  Past Medical History:  Diagnosis Date  . Coronary artery disease   . Elevated cholesterol   . Restless leg syndrome     Past Surgical History:  Procedure Laterality Date  . ANGIOPLASTY  2010   2 stents placed  . BACK SURGERY    . COLONOSCOPY WITH PROPOFOL N/A 03/05/2018   Procedure: COLONOSCOPY WITH PROPOFOL;  Surgeon: Virgel Manifold, MD;  Location: [facility];  Service: Endoscopy;  Laterality: N/A;  . CORONARY ANGIOPLASTY    . CORONARY/GRAFT ACUTE MI REVASCULARIZATION N/A 09/30/2019   Procedure: Coronary/Graft Acute MI Revascularization;  Surgeon: Wellington Hampshire, MD;  Location: [place] [facility];  Service: Cardiovascular;  Laterality: N/A;  . LEFT HEART CATH AND CORONARY ANGIOGRAPHY N/A 09/30/2019   Procedure: LEFT HEART CATH AND CORONARY ANGIOGRAPHY;  Surgeon: Wellington Hampshire, MD;  Location: [place] [facility];  Service: Cardiovascular;  Laterality: N/A;    Family History  Problem Relation Age of Onset  . Heart disease Mother   . Heart disease Father     Social History   Socioeconomic History  . Marital status: Married    Spouse name: Not on file  . Number of children: Not on file  . Years of education: Not on file  . Highest education level: Not on file  Occupational History  . Not on file  Tobacco Use  . Smoking status: Former Smoker    Quit date: 03/05/2009    Years since quitting: 11.0  . Smokeless tobacco: Never Used  Substance  and Sexual Activity  . Alcohol use: Yes    Comment: occassionally  . Drug use: Not Currently  . Sexual activity: Not on file  Other Topics Concern  . Not on file  Social History Narrative  . Not on file   Social Determinants of Health   Financial Resource Strain:   . Difficulty of Paying Living Expenses:   Food Insecurity:   . Worried About Charity fundraiser in the Last Year:   . Arboriculturist in the Last Year:   Transportation Needs:   . Film/video editor (Medical):   Marland Kitchen Lack of Transportation (Non-Medical):   Physical Activity:   . Days of Exercise per Week:   . Minutes of Exercise per Session:   Stress:   . Feeling of Stress :   Social Connections:   . Frequency of Communication with Friends and Family:   . Frequency of Social Gatherings with Friends and Family:   . Attends Religious Services:   . Active Member of Clubs or Organizations:   . Attends Archivist Meetings:   Marland Kitchen Marital Status:   Intimate Partner Violence:   . Fear of Current or Ex-Partner:   . Emotionally Abused:   Marland Kitchen Physically Abused:   . Sexually Abused:      Current Outpatient Medications:  .  ANORO ELLIPTA  62.5-25 MCG/INH AEPB, Inhale 1 puff into the lungs daily., Disp: , Rfl:  .  ascorbic acid (VITAMIN C) 500 MG tablet, Take 1 tablet (500 mg total) by mouth daily., Disp:  , Rfl:  .  Aspirin Buf,CaCarb-MgCarb-MgO, 81 MG TABS, Take 1 tablet by mouth daily., Disp: , Rfl:  .  atorvastatin (LIPITOR) 40 MG tablet, Take 40 mg by mouth daily., Disp: , Rfl: 8 .  carvedilol (COREG) 3.125 MG tablet, Take 1 tablet (3.125 mg total) by mouth 2 (two) times daily with a meal., Disp: 60 tablet, Rfl: 2 .  feeding supplement, ENSURE ENLIVE, (ENSURE ENLIVE) LIQD, Take 237 mLs by mouth 3 (three) times daily between meals. (Patient taking differently: Take 237 mLs by mouth daily. ), Disp: 237 mL, Rfl: 12 .  nitroGLYCERIN (NITROSTAT) 0.3 MG SL tablet, Place 1 tablet (0.3 mg total) under the tongue every  5 (five) minutes as needed for chest pain., Disp: 25 tablet, Rfl: 3 .  polyethylene glycol (MIRALAX / GLYCOLAX) packet, Take 17 g by mouth daily., Disp: , Rfl:  .  ticagrelor (BRILINTA) 90 MG TABS tablet, Take 1 tablet (90 mg total) by mouth 2 (two) times daily., Disp: 60 tablet, Rfl: 2 .  zinc sulfate 220 (50 Zn) MG capsule, Take 1 capsule (220 mg total) by mouth daily., Disp: 5 capsule, Rfl: 0   Allergies  Allergen Reactions  . Ace Inhibitors Swelling    ROS Review of Systems  Constitutional: Negative.   HENT: Negative.   Respiratory: Negative.   Cardiovascular: Negative.   Gastrointestinal: Negative.       Objective:    Physical Exam  Constitutional: He appears well-developed and well-nourished.  HENT:  Head: Normocephalic and atraumatic.  Eyes: Pupils are equal, round, and reactive to light.  Neck: No JVD present. No tracheal deviation present. No thyromegaly present.  Cardiovascular: Normal rate.  No murmur heard. Pulmonary/Chest: No stridor. No respiratory distress.  Abdominal: There is no abdominal tenderness.  Musculoskeletal:     Comments: Left shoulder is painful.  He cannot raise the left arm above the left shoulder.  There is no history of any recent injury.  Lymphadenopathy:    He has no cervical adenopathy.    BP 131/86   Pulse 75   Wt 202 lb 9.6 oz (91.9 kg)   BMI 26.01 kg/m  Wt Readings from Last 3 Encounters:  03/09/20 202 lb 9.6 oz (91.9 kg)  01/31/20 205 lb (93 kg)  10/27/19 197 lb (89.4 kg)     Health Maintenance Due  Topic Date Due  . Hepatitis C Screening  Never done  . COVID-19 Vaccine (1) Never done  . TETANUS/TDAP  Never done    There are no preventive care reminders to display for this patient.  No results found for: TSH Lab Results  Component Value Date   WBC 8.9 10/02/2019   HGB 12.7 (L) 10/02/2019   HCT 36.1 (L) 10/02/2019   MCV 90.5 10/02/2019   PLT 232 10/02/2019   Lab Results  Component Value Date   NA 134 (L)  10/02/2019   K 4.2 10/02/2019   CO2 20 (L) 10/02/2019   GLUCOSE 153 (H) 10/02/2019   BUN 16 10/02/2019   CREATININE 0.64 10/02/2019   BILITOT 1.1 10/01/2019   ALKPHOS 136 (H) 10/01/2019   AST 74 (H) 10/01/2019   ALT 84 (H) 10/01/2019   PROT 7.7 10/01/2019   ALBUMIN 2.8 (L) 10/01/2019   CALCIUM 8.7 (L) 10/02/2019   ANIONGAP 7 10/02/2019  Lab Results  Component Value Date   CHOL 103 10/02/2019   Lab Results  Component Value Date   HDL 33 (L) 10/02/2019   Lab Results  Component Value Date   LDLCALC 64 10/02/2019   Lab Results  Component Value Date   TRIG 30 10/02/2019   Lab Results  Component Value Date   CHOLHDL 3.1 10/02/2019   No results found for: HGBA1C    Assessment & Plan:   Joint Injection/Arthrocentesis  Date/Time: 03/09/2020 4:51 PM Performed by: [name], MD Authorized by: [name], MD  Indications: pain  Body area: shoulder Joint: left shoulder Local anesthesia used: yes Anesthesia: local infiltration  Anesthesia: Local anesthesia used: yes Local Anesthetic: lidocaine 2% without epinephrine  Sedation: Patient sedated: no  Needle size: 22 G Ultrasound guidance: no Approach: lateral Aspirate: clear and serous Methylprednisolone amount: 40 mg Lidocaine 1% amount: 1 mL Patient tolerance: patient tolerated the procedure well with no immediate complications Comments: After informed consent was obtained.  Left shoulder was prepared with alcohol and with sterile precaution first 1 cc of lidocaine was injected in the left subacromial bursa.  Next 1 cc of Kenalog equal to 40 mg was injected in the left subacromial space. patient tolerated the procedure well and he was watched in the office for 5 minutes after the shot.  In addition we used Marcaine 1 cc along with Kenalog to have a prolonged anesthesia effect.                   Problem List Items Addressed This Visit      Musculoskeletal and Integument   Disorder of left  rotator cuff - Primary   Relevant Orders   Joint Injection/Arthrocentesis     Other   ED (erectile dysfunction) of non-organic origin      No orders of the defined types were placed in this encounter.  Disorder of left rotator cuff - Plan: Joint Injection/Arthrocentesis  ED (erectile dysfunction) of non-organic origin   Follow-up: Return in about 4 weeks (around 04/06/2020).    [name], MD

## 2020-03-09 NOTE — Progress Notes (Deleted)
Joint Injection/Arthrocentesis  Date/Time: 03/09/2020 4:00 PM Performed by: Alois Cliche, CMA Authorized by: [name], MD  Indications: joint swelling and pain  Body area: shoulder Joint: left shoulder Local anesthesia used: yes  Anesthesia: Local anesthesia used: yes Local Anesthetic: lidocaine 2% without epinephrine  Sedation: Patient sedated: no  Needle size: 22 G Ultrasound guidance: no

## 2020-03-12 ENCOUNTER — Telehealth: Payer: Self-pay

## 2020-03-12 ENCOUNTER — Other Ambulatory Visit: Payer: Self-pay | Admitting: Family

## 2020-03-12 DIAGNOSIS — I2510 Atherosclerotic heart disease of native coronary artery without angina pectoris: Secondary | ICD-10-CM

## 2020-03-12 DIAGNOSIS — F5221 Male erectile disorder: Secondary | ICD-10-CM

## 2020-03-12 NOTE — Telephone Encounter (Signed)
Patient was seen last week with complaints of Erectile dysfunction. Patient states he would like something sent in for that. He uses [facility] on [address]

## 2020-03-14 MED ORDER — SILDENAFIL CITRATE 20 MG PO TABS: 20.0000 mg | ORAL_TABLET | ORAL | 0 refills | Status: DC | PRN

## 2020-03-14 MED ORDER — SILDENAFIL CITRATE 20 MG PO TABS: 20.0000 mg | ORAL_TABLET | Freq: Three times a day (TID) | ORAL | Status: DC

## 2020-03-14 NOTE — Telephone Encounter (Signed)
Verbal order was given to call in patient Sildentafil 20mg  6 tablets, if rx is too expensive send in Sildentafil 5mg  30 tablets per Dr. Lavera Guise. Patient was notified via phone that rx will be called in and to contact the office back if rx is too expensive and the dosage will be changed.

## 2020-03-14 NOTE — Telephone Encounter (Signed)
We will send sildenafil to pharmacy

## 2020-04-07 ENCOUNTER — Other Ambulatory Visit: Payer: Self-pay | Admitting: Internal Medicine

## 2020-04-16 ENCOUNTER — Other Ambulatory Visit: Payer: Self-pay | Admitting: *Deleted

## 2020-04-16 MED ORDER — SILDENAFIL CITRATE 20 MG PO TABS: 20.0000 mg | ORAL_TABLET | ORAL | 0 refills | Status: DC | PRN

## 2020-05-08 ENCOUNTER — Ambulatory Visit: Payer: 59 | Admitting: Internal Medicine

## 2020-05-11 ENCOUNTER — Ambulatory Visit: Payer: 59 | Admitting: Internal Medicine

## 2020-05-11 ENCOUNTER — Encounter: Payer: Self-pay | Admitting: Internal Medicine

## 2020-05-11 ENCOUNTER — Other Ambulatory Visit: Payer: Self-pay

## 2020-05-11 VITALS — BP 136/79 | HR 72 | Ht 74.0 in | Wt 197.4 lb

## 2020-05-11 DIAGNOSIS — J8410 Pulmonary fibrosis, unspecified: Secondary | ICD-10-CM | POA: Diagnosis not present

## 2020-05-11 DIAGNOSIS — F5221 Male erectile disorder: Secondary | ICD-10-CM | POA: Diagnosis not present

## 2020-05-11 DIAGNOSIS — M67912 Unspecified disorder of synovium and tendon, left shoulder: Secondary | ICD-10-CM | POA: Diagnosis not present

## 2020-05-11 DIAGNOSIS — L309 Dermatitis, unspecified: Secondary | ICD-10-CM | POA: Insufficient documentation

## 2020-05-11 DIAGNOSIS — I2510 Atherosclerotic heart disease of native coronary artery without angina pectoris: Secondary | ICD-10-CM | POA: Diagnosis not present

## 2020-05-11 DIAGNOSIS — I2583 Coronary atherosclerosis due to lipid rich plaque: Secondary | ICD-10-CM

## 2020-05-11 NOTE — Assessment & Plan Note (Signed)
ED is stable. He was advised to use his medication as prescribed and not to take nitroglycerin if he has any chest pain.

## 2020-05-11 NOTE — Assessment & Plan Note (Signed)
The patient has a ring-worm like rash on the front of the chest and right side of the neck. He is using Lotrisone cream to help it. I told him that we can refer him to a dermatologist if he so desired.

## 2020-05-11 NOTE — Assessment & Plan Note (Signed)
The patient has pulmonary fibrosis of unknown etiology. He is not using supplemental oxygen and is following up regularly with a pulmonary specialist.

## 2020-05-11 NOTE — Assessment & Plan Note (Signed)
The patient is feeling better after receiving a shot in the left shoulder during his last visit. He is able to raise his left arm above his left shoulder. He was advised to continue his rotator cuff exercises.

## 2020-05-11 NOTE — Assessment & Plan Note (Signed)
The patient denied any chest pain. He had a drug-eluting stent placed in LAD in December of 2020. He does not have any arrythmias or shortness of breath. He is taking his Brilinta. His left ventricular function was well preserved.

## 2020-05-11 NOTE — Progress Notes (Signed)
Established Patient Office Visit  SUBJECTIVE:  Subjective  Patient ID: Dillon [name], male    DOB: February 10, 1956  Age: 64 y.o. MRN: [id number]  CC:  Chief Complaint  Patient presents with  . Hypertension    follow up on blood pressure     HPI Dillon [name] is a 64 y.o. male presenting today for general follow-up. He reports improvement in left shoulder pain after having an injection during his last visit. He reports some shortness of breath secondary to lung fibrosis, COPD, and emphysema. He is being followed by Dr. Raul Del, pulmonologist. The patient quit smoking cigarettes 10 years ago. He also reports an occasional pruritic rash on his chest and right side of neck. He has been using Lotrisone cream with intermittent relief. He denies abdominal pain, chest pain, and dizziness. He has been taking all medications as prescribed.  Last colonoscopy was in May of 2019.  Of note, the patient was diagnosed with COVID-19 in December of 2020. Around that same time, he was admitted to the hospital for STEMI. Cardiac catherterization performed on 09/30/2019 showed a mild circumflex 30% stenosis and LAD 80% stenosis. A drug-eluting stent was placed. The rest of the coronary disease in the other vessel was minor. The left ventricular function was good.  Past Medical History:  Diagnosis Date  . Coronary artery disease   . Elevated cholesterol   . Restless leg syndrome     Past Surgical History:  Procedure Laterality Date  . ANGIOPLASTY  2010   2 stents placed  . BACK SURGERY    . COLONOSCOPY WITH PROPOFOL N/A 03/05/2018   Procedure: COLONOSCOPY WITH PROPOFOL;  Surgeon: Virgel Manifold, MD;  Location: [facility];  Service: Endoscopy;  Laterality: N/A;  . CORONARY ANGIOPLASTY    . CORONARY/GRAFT ACUTE MI REVASCULARIZATION N/A 09/30/2019   Procedure: Coronary/Graft Acute MI Revascularization;  Surgeon: Wellington Hampshire, MD;  Location: [facility];  Service: Cardiovascular;   Laterality: N/A;  . LEFT HEART CATH AND CORONARY ANGIOGRAPHY N/A 09/30/2019   Procedure: LEFT HEART CATH AND CORONARY ANGIOGRAPHY;  Surgeon: Wellington Hampshire, MD;  Location: [facility];  Service: Cardiovascular;  Laterality: N/A;    Family History  Problem Relation Age of Onset  . Heart disease Mother   . Heart disease Father     Social History   Socioeconomic History  . Marital status: Married    Spouse name: Not on file  . Number of children: Not on file  . Years of education: Not on file  . Highest education level: Not on file  Occupational History  . Not on file  Tobacco Use  . Smoking status: Former Smoker    Quit date: 03/05/2009    Years since quitting: 11.1  . Smokeless tobacco: Never Used  Vaping Use  . Vaping Use: Never used  Substance and Sexual Activity  . Alcohol use: Yes    Comment: occassionally  . Drug use: Not Currently  . Sexual activity: Not on file  Other Topics Concern  . Not on file  Social History Narrative  . Not on file   Social Determinants of Health   Financial Resource Strain:   . Difficulty of Paying Living Expenses:   Food Insecurity:   . Worried About Charity fundraiser in the Last Year:   . Arboriculturist in the Last Year:   Transportation Needs:   . Film/video editor (Medical):   Marland Kitchen Lack of Transportation (  Non-Medical):   Physical Activity:   . Days of Exercise per Week:   . Minutes of Exercise per Session:   Stress:   . Feeling of Stress :   Social Connections:   . Frequency of Communication with Friends and Family:   . Frequency of Social Gatherings with Friends and Family:   . Attends Religious Services:   . Active Member of Clubs or Organizations:   . Attends Archivist Meetings:   Marland Kitchen Marital Status:   Intimate Partner Violence:   . Fear of Current or Ex-Partner:   . Emotionally Abused:   Marland Kitchen Physically Abused:   . Sexually Abused:      Current Outpatient Medications:  .  ANORO ELLIPTA  62.5-25 MCG/INH AEPB, Inhale 1 puff into the lungs daily., Disp: , Rfl:  .  ascorbic acid (VITAMIN C) 500 MG tablet, Take 1 tablet (500 mg total) by mouth daily., Disp:  , Rfl:  .  Aspirin Buf,CaCarb-MgCarb-MgO, 81 MG TABS, Take 1 tablet by mouth daily., Disp: , Rfl:  .  atorvastatin (LIPITOR) 40 MG tablet, Take 40 mg by mouth daily., Disp: , Rfl: 8 .  carvedilol (COREG) 3.125 MG tablet, TAKE 1 TABLET (3.125 MG TOTAL) BY MOUTH 2 (TWO) TIMES DAILY WITH A MEAL., Disp: 60 tablet, Rfl: 2 .  feeding supplement, ENSURE ENLIVE, (ENSURE ENLIVE) LIQD, Take 237 mLs by mouth 3 (three) times daily between meals. (Patient taking differently: Take 237 mLs by mouth daily. ), Disp: 237 mL, Rfl: 12 .  nitroGLYCERIN (NITROSTAT) 0.3 MG SL tablet, Place 1 tablet (0.3 mg total) under the tongue every 5 (five) minutes as needed for chest pain., Disp: 25 tablet, Rfl: 3 .  polyethylene glycol (MIRALAX / GLYCOLAX) packet, Take 17 g by mouth daily., Disp: , Rfl:  .  sildenafil (REVATIO) 20 MG tablet, Take 1 tablet (20 mg total) by mouth as needed for up to 6 doses., Disp: 10 tablet, Rfl: 0 .  ticagrelor (BRILINTA) 90 MG TABS tablet, Take 1 tablet (90 mg total) by mouth 2 (two) times daily., Disp: 60 tablet, Rfl: 2 .  zinc sulfate 220 (50 Zn) MG capsule, Take 1 capsule (220 mg total) by mouth daily., Disp: 5 capsule, Rfl: 0   Allergies  Allergen Reactions  . Ace Inhibitors Swelling    ROS Review of Systems  Constitutional: Negative.   HENT: Negative.   Eyes: Negative.   Respiratory: Positive for shortness of breath.   Cardiovascular: Negative.   Gastrointestinal: Negative.   Endocrine: Negative.   Genitourinary: Negative.   Musculoskeletal: Negative.   Skin: Positive for rash.  Allergic/Immunologic: Negative.   Neurological: Negative.   Hematological: Negative.   Psychiatric/Behavioral: Negative.   All other systems reviewed and are negative.    OBJECTIVE:    Physical Exam Vitals reviewed.    Constitutional:      Appearance: Normal appearance.  HENT:     Mouth/Throat:     Mouth: Mucous membranes are moist.  Eyes:     Pupils: Pupils are equal, round, and reactive to light.  Neck:     Vascular: No carotid bruit.  Cardiovascular:     Rate and Rhythm: Normal rate and regular rhythm.     Pulses: Normal pulses.     Heart sounds: Murmur heard.      Comments: Grade 2 systolic murmur Pulmonary:     Comments: Fine crackles Abdominal:     General: Bowel sounds are normal.     Palpations: Abdomen is soft. There  is no hepatomegaly, splenomegaly or mass.     Tenderness: There is no abdominal tenderness.     Hernia: No hernia is present.  Musculoskeletal:     Cervical back: Neck supple.     Right lower leg: No edema.     Left lower leg: No edema.  Skin:    Findings: Rash present.     Comments: Erythematous multiforme rash on right side of neck and chest  Neurological:     Mental Status: He is alert and oriented to person, place, and time.     Motor: No weakness.  Psychiatric:        Mood and Affect: Mood normal.        Behavior: Behavior normal.     BP (!) 136/79   Pulse 72   Ht 6\' 2"  (1.88 m)   Wt 197 lb 6.4 oz (89.5 kg)   BMI 25.34 kg/m  Wt Readings from Last 3 Encounters:  05/11/20 197 lb 6.4 oz (89.5 kg)  03/09/20 202 lb 9.6 oz (91.9 kg)  01/31/20 205 lb (93 kg)    Health Maintenance Due  Topic Date Due  . Hepatitis C Screening  Never done  . COVID-19 Vaccine (1) Never done  . TETANUS/TDAP  Never done    There are no preventive care reminders to display for this patient.  CBC Latest Ref Rng & Units 10/02/2019 10/01/2019 09/30/2019  WBC 4.0 - 10.5 K/uL 8.9 6.6 8.6  Hemoglobin 13.0 - 17.0 g/dL 12.7(L) 12.8(L) 14.0  Hematocrit 39 - 52 % 36.1(L) 38.0(L) 40.9  Platelets 150 - 400 K/uL 232 210 211   CMP Latest Ref Rng & Units 10/02/2019 10/01/2019 09/30/2019  Glucose 70 - 99 mg/dL 153(H) 95 107(H)  BUN 8 - 23 mg/dL 16 13 13   Creatinine 0.61 - 1.24  mg/dL 0.64 0.56(L) 0.76  Sodium 135 - 145 mmol/L 134(L) 134(L) 134(L)  Potassium 3.5 - 5.1 mmol/L 4.2 4.0 4.0  Chloride 98 - 111 mmol/L 107 105 102  CO2 22 - 32 mmol/L 20(L) 21(L) 22  Calcium 8.9 - 10.3 mg/dL 8.7(L) 8.2(L) 8.3(L)  Total Protein 6.5 - 8.1 g/dL - 7.7 -  Total Bilirubin 0.3 - 1.2 mg/dL - 1.1 -  Alkaline Phos 38 - 126 U/L - 136(H) -  AST 15 - 41 U/L - 74(H) -  ALT 0 - 44 U/L - 84(H) -    No results found for: TSH Lab Results  Component Value Date   ALBUMIN 2.8 (L) 10/01/2019   ANIONGAP 7 10/02/2019   Lab Results  Component Value Date   CHOL 103 10/02/2019   HDL 33 (L) 10/02/2019   LDLCALC 64 10/02/2019   CHOLHDL 3.1 10/02/2019   Lab Results  Component Value Date   TRIG 30 10/02/2019   No results found for: HGBA1C    ASSESSMENT & PLAN:   Problem List Items Addressed This Visit      Cardiovascular and Mediastinum   Coronary artery disease due to lipid rich plaque    The patient denied any chest pain. He had a drug-eluting stent placed in LAD in December of 2020. He does not have any arrythmias or shortness of breath. He is taking his Brilinta. His left ventricular function was well preserved.         Respiratory   Pulmonary fibrosis (HCC)    The patient has pulmonary fibrosis of unknown etiology. He is not using supplemental oxygen and is following up regularly with a pulmonary specialist.  Musculoskeletal and Integument   Disorder of left rotator cuff - Primary    The patient is feeling better after receiving a shot in the left shoulder during his last visit. He is able to raise his left arm above his left shoulder. He was advised to continue his rotator cuff exercises.       Dermatitis    The patient has a ring-worm like rash on the front of the chest and right side of the neck. He is using Lotrisone cream to help it. I told him that we can refer him to a dermatologist if he so desired.        Other   ED (erectile dysfunction) of  non-organic origin    ED is stable. He was advised to use his medication as prescribed and not to take nitroglycerin if he has any chest pain.         No orders of the defined types were placed in this encounter.   Follow-up: No follow-ups on file.    Dr. Jane Canary [facility] [address] [place], [place] [address]   By signing my name below, I, Clerance Lav, attest that this documentation has been prepared under the direction and in the presence of [name], MD. Electronically Signed: [name], MD 05/11/20, 3:39 PM   I personally performed the services described in this documentation, which was SCRIBED in my presence. The recorded information has been reviewed and considered accurate. It has been edited as necessary during review. [name], MD

## 2020-06-06 ENCOUNTER — Other Ambulatory Visit: Payer: Self-pay | Admitting: Family

## 2020-06-06 DIAGNOSIS — I2510 Atherosclerotic heart disease of native coronary artery without angina pectoris: Secondary | ICD-10-CM

## 2020-06-10 ENCOUNTER — Other Ambulatory Visit: Payer: Self-pay

## 2020-06-10 DIAGNOSIS — L03317 Cellulitis of buttock: Principal | ICD-10-CM | POA: Insufficient documentation

## 2020-06-10 DIAGNOSIS — Z87891 Personal history of nicotine dependence: Secondary | ICD-10-CM | POA: Diagnosis not present

## 2020-06-10 DIAGNOSIS — Z20822 Contact with and (suspected) exposure to covid-19: Secondary | ICD-10-CM | POA: Diagnosis not present

## 2020-06-10 DIAGNOSIS — I2510 Atherosclerotic heart disease of native coronary artery without angina pectoris: Secondary | ICD-10-CM | POA: Insufficient documentation

## 2020-06-10 DIAGNOSIS — L0231 Cutaneous abscess of buttock: Secondary | ICD-10-CM | POA: Diagnosis present

## 2020-06-10 DIAGNOSIS — Z7982 Long term (current) use of aspirin: Secondary | ICD-10-CM | POA: Insufficient documentation

## 2020-06-10 LAB — CBC WITH DIFFERENTIAL/PLATELET
Abs Immature Granulocytes: 0.07 10*3/uL (ref 0.00–0.07)
Basophils Absolute: 0.1 10*3/uL (ref 0.0–0.1)
Basophils Relative: 0 %
Eosinophils Absolute: 0.2 10*3/uL (ref 0.0–0.5)
Eosinophils Relative: 1 %
HCT: 42.1 % (ref 39.0–52.0)
Hemoglobin: 14.2 g/dL (ref 13.0–17.0)
Immature Granulocytes: 1 %
Lymphocytes Relative: 9 %
Lymphs Abs: 1.2 10*3/uL (ref 0.7–4.0)
MCH: 33.3 pg (ref 26.0–34.0)
MCHC: 33.7 g/dL (ref 30.0–36.0)
MCV: 98.6 fL (ref 80.0–100.0)
Monocytes Absolute: 1.4 10*3/uL — ABNORMAL HIGH (ref 0.1–1.0)
Monocytes Relative: 11 %
Neutro Abs: 10.2 10*3/uL — ABNORMAL HIGH (ref 1.7–7.7)
Neutrophils Relative %: 78 %
Platelets: 178 10*3/uL (ref 150–400)
RBC: 4.27 MIL/uL (ref 4.22–5.81)
RDW: 13.2 % (ref 11.5–15.5)
WBC: 13.2 10*3/uL — ABNORMAL HIGH (ref 4.0–10.5)
nRBC: 0 % (ref 0.0–0.2)

## 2020-06-10 LAB — COMPREHENSIVE METABOLIC PANEL
ALT: 27 U/L (ref 0–44)
AST: 25 U/L (ref 15–41)
Albumin: 3.6 g/dL (ref 3.5–5.0)
Alkaline Phosphatase: 93 U/L (ref 38–126)
Anion gap: 11 (ref 5–15)
BUN: 15 mg/dL (ref 8–23)
CO2: 25 mmol/L (ref 22–32)
Calcium: 8.4 mg/dL — ABNORMAL LOW (ref 8.9–10.3)
Chloride: 100 mmol/L (ref 98–111)
Creatinine, Ser: 0.95 mg/dL (ref 0.61–1.24)
GFR calc Af Amer: >60 mL/min (ref >60)
GFR calc non Af Amer: >60 mL/min (ref >60)
Glucose, Bld: 129 mg/dL — ABNORMAL HIGH (ref 70–99)
Potassium: 4 mmol/L (ref 3.5–5.1)
Sodium: 136 mmol/L (ref 135–145)
Total Bilirubin: 1.2 mg/dL (ref 0.3–1.2)
Total Protein: 8.2 g/dL — ABNORMAL HIGH (ref 6.5–8.1)

## 2020-06-10 NOTE — ED Triage Notes (Signed)
Pt comes POV with abscess/boil on his bottom for about 4 days. Pt reports that he thinks he's had a fever at home.

## 2020-06-11 ENCOUNTER — Emergency Department: Payer: 59

## 2020-06-11 ENCOUNTER — Observation Stay
Admission: EM | Admit: 2020-06-11 | Discharge: 2020-06-12 | Disposition: A | Discharge status: Home / Self Care | Payer: 59 | Attending: Internal Medicine | Admitting: Internal Medicine

## 2020-06-11 DIAGNOSIS — I2510 Atherosclerotic heart disease of native coronary artery without angina pectoris: Secondary | ICD-10-CM | POA: Diagnosis present

## 2020-06-11 DIAGNOSIS — L03317 Cellulitis of buttock: Secondary | ICD-10-CM

## 2020-06-11 DIAGNOSIS — L0291 Cutaneous abscess, unspecified: Secondary | ICD-10-CM

## 2020-06-11 LAB — CBC
HCT: 40.7 % (ref 39.0–52.0)
Hemoglobin: 13.9 g/dL (ref 13.0–17.0)
MCH: 33.3 pg (ref 26.0–34.0)
MCHC: 34.2 g/dL (ref 30.0–36.0)
MCV: 97.4 fL (ref 80.0–100.0)
Platelets: 184 10*3/uL (ref 150–400)
RBC: 4.18 MIL/uL — ABNORMAL LOW (ref 4.22–5.81)
RDW: 13.2 % (ref 11.5–15.5)
WBC: 15.7 10*3/uL — ABNORMAL HIGH (ref 4.0–10.5)
nRBC: 0 % (ref 0.0–0.2)

## 2020-06-11 LAB — CREATININE, SERUM
Creatinine, Ser: 0.8 mg/dL (ref 0.61–1.24)
GFR calc Af Amer: >60 mL/min (ref >60)
GFR calc non Af Amer: >60 mL/min (ref >60)

## 2020-06-11 LAB — URINALYSIS, COMPLETE (UACMP) WITH MICROSCOPIC
Bacteria, UA: NONE SEEN
Bilirubin Urine: NEGATIVE
Glucose, UA: NEGATIVE mg/dL
Ketones, ur: NEGATIVE mg/dL
Leukocytes,Ua: NEGATIVE
Nitrite: NEGATIVE
Protein, ur: NEGATIVE mg/dL
Specific Gravity, Urine: 1.016 (ref 1.005–1.030)
pH: 6 (ref 5.0–8.0)

## 2020-06-11 LAB — SARS CORONAVIRUS 2 BY RT PCR (HOSPITAL ORDER, PERFORMED IN ~~LOC~~ HOSPITAL LAB): SARS Coronavirus 2: NEGATIVE

## 2020-06-11 MED ORDER — ENOXAPARIN SODIUM 40 MG/0.4ML ~~LOC~~ SOLN
40.0000 mg | SUBCUTANEOUS | Status: DC
  Administered 2020-06-11: 40 mg via SUBCUTANEOUS
  Filled 2020-06-11: qty 0.4

## 2020-06-11 MED ORDER — ACETAMINOPHEN 650 MG RE SUPP: 650.0000 mg | Freq: Four times a day (QID) | RECTAL | Status: DC | PRN

## 2020-06-11 MED ORDER — ONDANSETRON HCL 4 MG/2ML IJ SOLN
4.0000 mg | Freq: Four times a day (QID) | INTRAMUSCULAR | Status: DC | PRN
  Administered 2020-06-11: 4 mg via INTRAVENOUS
  Filled 2020-06-11: qty 2

## 2020-06-11 MED ORDER — HYDROCODONE-ACETAMINOPHEN 5-325 MG PO TABS: 1.0000 | ORAL_TABLET | ORAL | Status: DC | PRN

## 2020-06-11 MED ORDER — VANCOMYCIN HCL IN DEXTROSE 1-5 GM/200ML-% IV SOLN
1000.0000 mg | Freq: Once | INTRAVENOUS | Status: AC
  Administered 2020-06-11: 1000 mg via INTRAVENOUS
  Filled 2020-06-11: qty 200

## 2020-06-11 MED ORDER — ONDANSETRON HCL 4 MG PO TABS: 4.0000 mg | ORAL_TABLET | Freq: Four times a day (QID) | ORAL | Status: DC | PRN

## 2020-06-11 MED ORDER — SODIUM CHLORIDE 0.9 % IV SOLN
1.0000 g | Freq: Once | INTRAVENOUS | Status: AC
  Administered 2020-06-11: 1 g via INTRAVENOUS
  Filled 2020-06-11: qty 10

## 2020-06-11 MED ORDER — VANCOMYCIN HCL IN DEXTROSE 1-5 GM/200ML-% IV SOLN
1000.0000 mg | Freq: Three times a day (TID) | INTRAVENOUS | Status: DC
  Administered 2020-06-11 – 2020-06-12 (×2): 1000 mg via INTRAVENOUS
  Filled 2020-06-11 (×2): qty 200

## 2020-06-11 MED ORDER — MORPHINE SULFATE (PF) 2 MG/ML IV SOLN
2.0000 mg | INTRAVENOUS | Status: DC | PRN
  Administered 2020-06-11: 2 mg via INTRAVENOUS
  Filled 2020-06-11: qty 1

## 2020-06-11 MED ORDER — SODIUM CHLORIDE 0.9 % IV SOLN: 1.0000 g | INTRAVENOUS | Status: DC

## 2020-06-11 MED ORDER — ACETAMINOPHEN 325 MG PO TABS
650.0000 mg | ORAL_TABLET | Freq: Four times a day (QID) | ORAL | Status: DC | PRN
  Administered 2020-06-11: 650 mg via ORAL
  Filled 2020-06-11: qty 2

## 2020-06-11 NOTE — ED Notes (Signed)
Pt eating lunch tray at this time  

## 2020-06-11 NOTE — H&P (Signed)
History and Physical    Dillon [name] GEZ:[id number] DOB: 12/11/1955 DOA: 06/11/2020  PCP: [name], MD   Patient coming from: home  I have personally briefly reviewed patient's old medical records in [place]  Chief Complaint: Boil on buttock  HPI: Dillon [name] is a 64 y.o. male with medical history significant for CAD who presents to the emergency room with a 4-day history of a boil on his left buttock associated with a fever.  His wife tried to express the contents with some success but area continued to be painful with increasing area of redness pain and swelling so he decided to come to the emergency room.  Temperature at home was 101.  He denied nausea vomiting, cough or shortness of breath ED Course: On arrival temperature 99.8 with otherwise normal vitals.  WBC 13,000, ultrasound showing no discrete or drainable abscess but showing focal edema and hyperemia consistent with cellulitis.  Patient started on Rocephin.  Hospitalist consulted for admission.  Review of Systems: As per HPI otherwise all other systems on review of systems negative.    Past Medical History:  Diagnosis Date  . Coronary artery disease   . Elevated cholesterol   . Restless leg syndrome     Past Surgical History:  Procedure Laterality Date  . ANGIOPLASTY  2010   2 stents placed  . BACK SURGERY    . COLONOSCOPY WITH PROPOFOL N/A 03/05/2018   Procedure: COLONOSCOPY WITH PROPOFOL;  Surgeon: Virgel Manifold, MD;  Location: [facility];  Service: Endoscopy;  Laterality: N/A;  . CORONARY ANGIOPLASTY    . CORONARY/GRAFT ACUTE MI REVASCULARIZATION N/A 09/30/2019   Procedure: Coronary/Graft Acute MI Revascularization;  Surgeon: Wellington Hampshire, MD;  Location: [facility];  Service: Cardiovascular;  Laterality: N/A;  . LEFT HEART CATH AND CORONARY ANGIOGRAPHY N/A 09/30/2019   Procedure: LEFT HEART CATH AND CORONARY ANGIOGRAPHY;  Surgeon: Wellington Hampshire, MD;  Location: [facility];  Service: Cardiovascular;  Laterality: N/A;     reports that he quit smoking about 11 years ago. He has never used smokeless tobacco. He reports current alcohol use. He reports previous drug use.  Allergies  Allergen Reactions  . Ace Inhibitors Swelling    Family History  Problem Relation Age of Onset  . Heart disease Mother   . Heart disease Father       Prior to Admission medications   Medication Sig Start Date End Date Taking? Authorizing Provider  ANORO ELLIPTA 62.5-25 MCG/INH AEPB Inhale 1 puff into the lungs daily. 11/24/19   [provider]  ascorbic acid (VITAMIN C) 500 MG tablet Take 1 tablet (500 mg total) by mouth daily. 10/06/19   Ezekiel Slocumb, DO  Aspirin Buf,CaCarb-MgCarb-MgO, 81 MG TABS Take 1 tablet by mouth daily. 07/05/09   [provider]  atorvastatin (LIPITOR) 40 MG tablet Take 40 mg by mouth daily. 06/10/18   [provider]  carvedilol (COREG) 3.125 MG tablet TAKE 1 TABLET (3.125 MG TOTAL) BY MOUTH 2 (TWO) TIMES DAILY WITH A MEAL. 06/06/20   Loel Dubonnet, NP  feeding supplement, ENSURE ENLIVE, (ENSURE ENLIVE) LIQD Take 237 mLs by mouth 3 (three) times daily between meals. Patient taking differently: Take 237 mLs by mouth daily.  10/05/19   Ezekiel Slocumb, DO  nitroGLYCERIN (NITROSTAT) 0.3 MG SL tablet Place 1 tablet (0.3 mg total) under the tongue every 5 (five) minutes as needed for chest pain. 11/28/19   Laurann Montana  S, NP  polyethylene glycol (MIRALAX / GLYCOLAX) packet Take 17 g by mouth daily.    Virgel Manifold, MD  sildenafil (REVATIO) 20 MG tablet Take 1 tablet (20 mg total) by mouth as needed for up to 6 doses. 04/16/20   [name], MD  ticagrelor (BRILINTA) 90 MG TABS tablet Take 1 tablet (90 mg total) by mouth 2 (two) times daily. 10/27/19   Loel Dubonnet, NP  zinc sulfate 220 (50 Zn) MG capsule Take 1 capsule (220 mg total) by mouth daily. 10/06/19   Ezekiel Slocumb, DO    Physical  Exam: Vitals:   06/10/20 2202 06/11/20 0122 06/11/20 0300 06/11/20 0426  BP: 139/67 123/78 112/60 123/82  Pulse: 86 75 69 74  Resp: 16 16    Temp: 98.9 F (37.2 C) 99.1 F (37.3 C)    TempSrc: Oral Oral    SpO2: 94% 94% 91% 95%  Weight:      Height:         Vitals:   06/10/20 2202 06/11/20 0122 06/11/20 0300 06/11/20 0426  BP: 139/67 123/78 112/60 123/82  Pulse: 86 75 69 74  Resp: 16 16    Temp: 98.9 F (37.2 C) 99.1 F (37.3 C)    TempSrc: Oral Oral    SpO2: 94% 94% 91% 95%  Weight:      Height:          Constitutional: Alert and oriented x 3 . Not in any apparent distress HEENT:      Head: Normocephalic and atraumatic.         Eyes: PERLA, EOMI, Conjunctivae are normal. Sclera is non-icteric.       Mouth/Throat: Mucous membranes are moist.       Neck: Supple with no signs of meningismus. Cardiovascular: Regular rate and rhythm. No murmurs, gallops, or rubs. 2+ symmetrical distal pulses are present . No JVD. No LE edema Respiratory: Respiratory effort normal .Lungs sounds clear bilaterally. No wheezes, crackles, or rhonchi.  Gastrointestinal: Soft, non tender, and non distended with positive bowel sounds. No rebound or guarding. Genitourinary: No CVA tenderness. Musculoskeletal: Nontender with normal range of motion in all extremities. No cyanosis, or erythema of extremities. Neurologic: Normal speech and language. Face is symmetric. Moving all extremities. No gross focal neurologic deficits . Skin:  Large red area about 8 cm diameter on left buttock with central ulcer without pus drainage Psychiatric: Mood and affect are normal Speech and behavior are normal   Labs on Admission: I have personally reviewed following labs and imaging studies  CBC: Recent Labs  Lab 06/10/20 1921  WBC 13.2*  NEUTROABS 10.2*  HGB 14.2  HCT 42.1  MCV 98.6  PLT 448   Basic Metabolic Panel: Recent Labs  Lab 06/10/20 1921  NA 136  K 4.0  CL 100  CO2 25  GLUCOSE 129*  BUN  15  CREATININE 0.95  CALCIUM 8.4*   GFR: Estimated Creatinine Clearance: 92.5 mL/min (by C-G formula based on SCr of 0.95 mg/dL). Liver Function Tests: Recent Labs  Lab 06/10/20 1921  AST 25  ALT 27  ALKPHOS 93  BILITOT 1.2  PROT 8.2*  ALBUMIN 3.6   No results for input(s): LIPASE, AMYLASE in the last 168 hours. No results for input(s): AMMONIA in the last 168 hours. Coagulation Profile: No results for input(s): INR, PROTIME in the last 168 hours. Cardiac Enzymes: No results for input(s): CKTOTAL, CKMB, CKMBINDEX, TROPONINI in the last 168 hours. BNP (last 3 results) No  results for input(s): PROBNP in the last 8760 hours. HbA1C: No results for input(s): HGBA1C in the last 72 hours. CBG: No results for input(s): GLUCAP in the last 168 hours. Lipid Profile: No results for input(s): CHOL, HDL, LDLCALC, TRIG, CHOLHDL, LDLDIRECT in the last 72 hours. Thyroid Function Tests: No results for input(s): TSH, T4TOTAL, FREET4, T3FREE, THYROIDAB in the last 72 hours. Anemia Panel: No results for input(s): VITAMINB12, FOLATE, FERRITIN, TIBC, IRON, RETICCTPCT in the last 72 hours. Urine analysis:    Component Value Date/Time   COLORURINE Yellow 03/06/2014 0058   APPEARANCEUR Clear 03/06/2014 0058   LABSPEC 1.025 03/06/2014 0058   PHURINE 5.0 03/06/2014 0058   GLUCOSEU Negative 03/06/2014 0058   HGBUR Negative 03/06/2014 0058   BILIRUBINUR Negative 03/06/2014 0058   KETONESUR Negative 03/06/2014 0058   PROTEINUR Negative 03/06/2014 0058   NITRITE Negative 03/06/2014 0058   LEUKOCYTESUR Negative 03/06/2014 0058    Radiological Exams on Admission: US PELVIS LIMITED (TRANSABDOMINAL ONLY)  Result Date: 06/11/2020 CLINICAL DATA:  Left buttock mass.  Assess for abscess. EXAM: LIMITED ULTRASOUND OF PELVIS TECHNIQUE: Limited transabdominal ultrasound examination of the pelvis was performed. COMPARISON:  None. FINDINGS: Focal area of edema within open wound is present in the left  buttocks. Hyperemia is noted. No discrete or drainable abscess is present. The opening is again is identified. IMPRESSION: 1. No discrete or drainable abscess. 2. Focal edema and hyperemia consistent with cellulitis. Electronically Signed   By: San Morelle M.D.   On: 06/11/2020 04:14    Assessment/Plan 64 year old male with history of CAD presenting with pain redness and swelling left buttock    Cellulitis of left buttock -Continue IV Rocephin started in the ER -Ultrasound shows no fluid collection -Pain management    Coronary artery disease due to lipid rich plaque -Continue aspirin, atorvastatin, carvedilol and sublingual NTG pending med rec     DVT prophylaxis: Lovenox  Code Status: full code  Family Communication:  none  Disposition Plan: Back to previous home environment Consults called: none  Status: Observation    Athena Masse MD [facility]     06/11/2020, 4:27 AM

## 2020-06-11 NOTE — Progress Notes (Signed)
Dillon [name] is a 64 y.o. male with a history of CAD who presented with 4 day history of a boil on his left buttock associated with fever up to 101F day prior to admission with purulent discharge at home who was admitted early this morning by Dr. Damita Dunnings for cellulitis.  Currently, patient and wife at bedside state that he had a fever at home up to 101F. Has not had a fever here but he states that he did take some Tylenol for pain this morning around 4 am this morning. Wife states the rash looks improved from yesterday. Patient still complaining of some tenderness and warmth.   PE: General: Awake and alert, no acute distress  Heart: S1 and S2 auscultated, no murmurs  Lungs: Clear to auscultation bilaterally, no wheeze  Skin: see picture of left buttock       A/P  1. Purulent Cellulitis a. Had a fever at home up to 101F. No fevers recorded here but took tylenol prior to coming in and took tylenol this morning so unsure if he is still febrile or not b. WBC 13->15 c. Change Ceftriaxone for Vancomycin d. Discontinue Tylenol e. Patient will need to be afebrile x 24 hours without antipyretics prior to discharge    Dillon [name], [place] Pager [phone]

## 2020-06-11 NOTE — ED Notes (Signed)
As per Dr. Damita Dunnings, keep oxygen saturation above 90%. Pt placed on 2lpm via [place]

## 2020-06-11 NOTE — Progress Notes (Signed)
Patient tx to room 37

## 2020-06-11 NOTE — ED Notes (Signed)
Provider messaged "Dr. Damita Dunnings. this pts oxygen has dropped to 87% on room air. He states he has COPD and emphysema. How high would you like [place] to keep his oxygen saturation?"

## 2020-06-11 NOTE — Progress Notes (Signed)
Pharmacy Antibiotic Note  Dillon [name] is a 64 y.o. male admitted on 06/11/2020 with cellulitis of buttock.  Pharmacy has been consulted for vancomycin dosing.   Today, WBC elevated at 15.7 and patient afebrile. Renal function stable with SCr <1. No fluid collections on ultrasound but pharmacy will monitor for further workup.  Plan: Continue vancomycin at 1000mg  IV q8h Monitor clinical picture, renal function, vanc trough at [facility] as needed for continued treatment F/U C&S, abx de-escalation, LOT  Height: 6\' 2"  (188 cm) Weight: 90.7 kg (200 lb) IBW/kg (Calculated) : 82.2  Temp (24hrs), Avg:99.3 F (37.4 C), Min:98.9 F (37.2 C), Max:99.8 F (37.7 C)  Recent Labs  Lab 06/10/20 1921 06/11/20 0454 06/11/20 0849  WBC 13.2*  --  15.7*  CREATININE 0.95 0.80  --     Estimated Creatinine Clearance: 109.9 mL/min (by C-G formula based on SCr of 0.8 mg/dL).    Allergies  Allergen Reactions  . Ace Inhibitors Swelling    Antimicrobials this admission: vanc 8/23> CTX 8/23 x1  Microbiology results: 8/23 COVID negative  Thank you for allowing pharmacy to be a part of this patient's care.  Brendolyn Patty, PharmD Clinical Pharmacist  06/11/2020   10:31 AM

## 2020-06-11 NOTE — ED Provider Notes (Signed)
[facility] Emergency Department Provider Note  ____________________________________________   First MD Initiated Contact with Patient 06/11/20 0144     (approximate)  I have reviewed the triage vital signs and the nursing notes.   HISTORY  Chief Complaint Abscess    HPI Dillon [name] is a 64 y.o. male with below list of previous medical conditions including pulmonary fibrosis, previous ST elevation MI presents emergency department secondary to a 4-day history of "abscess to the left buttocks.  Patient states that his wife and daughter attempted to "squeeze the pus out but that symptoms have worsened since then.  Patient also admits to subjective fevers at home.  Patient's wife at bedside states that his temperature was 101.  Patient denies any previous abscess.        Past Medical History:  Diagnosis Date  . Coronary artery disease   . Elevated cholesterol   . Restless leg syndrome     Patient Active Problem List   Diagnosis Date Noted  . Cellulitis of left buttock 06/11/2020  . Pulmonary fibrosis ([place]) 05/11/2020  . Coronary artery disease due to lipid rich plaque 05/11/2020  . Dermatitis 05/11/2020  . Disorder of left rotator cuff 03/09/2020  . ED (erectile dysfunction) of non-organic origin 03/09/2020  . ST elevation myocardial infarction (STEMI) ([place])   . COVID-19 virus infection   . Acute ST elevation myocardial infarction (STEMI) of anterior wall ([place]) 09/30/2019  . Encounter for screening colonoscopy   . Rectal polyp   . Benign neoplasm of ascending colon   . Internal hemorrhoids   . Proctitis   . Diverticulosis of large intestine without diverticulitis     Past Surgical History:  Procedure Laterality Date  . ANGIOPLASTY  2010   2 stents placed  . BACK SURGERY    . COLONOSCOPY WITH PROPOFOL N/A 03/05/2018   Procedure: COLONOSCOPY WITH PROPOFOL;  Surgeon: Virgel Manifold, MD;  Location: [facility] ENDOSCOPY;  Service: Endoscopy;   Laterality: N/A;  . CORONARY ANGIOPLASTY    . CORONARY/GRAFT ACUTE MI REVASCULARIZATION N/A 09/30/2019   Procedure: Coronary/Graft Acute MI Revascularization;  Surgeon: Wellington Hampshire, MD;  Location: [place] [facility];  Service: Cardiovascular;  Laterality: N/A;  . LEFT HEART CATH AND CORONARY ANGIOGRAPHY N/A 09/30/2019   Procedure: LEFT HEART CATH AND CORONARY ANGIOGRAPHY;  Surgeon: Wellington Hampshire, MD;  Location: [place] [facility];  Service: Cardiovascular;  Laterality: N/A;    Prior to Admission medications   Medication Sig Start Date End Date Taking? Authorizing Provider  ANORO ELLIPTA 62.5-25 MCG/INH AEPB Inhale 1 puff into the lungs daily. 11/24/19   [provider]  ascorbic acid (VITAMIN C) 500 MG tablet Take 1 tablet (500 mg total) by mouth daily. 10/06/19   Ezekiel Slocumb, DO  Aspirin Buf,CaCarb-MgCarb-MgO, 81 MG TABS Take 1 tablet by mouth daily. 07/05/09   [provider]  atorvastatin (LIPITOR) 40 MG tablet Take 40 mg by mouth daily. 06/10/18   [provider]  carvedilol (COREG) 3.125 MG tablet TAKE 1 TABLET (3.125 MG TOTAL) BY MOUTH 2 (TWO) TIMES DAILY WITH A MEAL. 06/06/20   Loel Dubonnet, NP  feeding supplement, ENSURE ENLIVE, (ENSURE ENLIVE) LIQD Take 237 mLs by mouth 3 (three) times daily between meals. Patient taking differently: Take 237 mLs by mouth daily.  10/05/19   Ezekiel Slocumb, DO  nitroGLYCERIN (NITROSTAT) 0.3 MG SL tablet Place 1 tablet (0.3 mg total) under the tongue every 5 (five) minutes as needed  for chest pain. 11/28/19   Loel Dubonnet, NP  polyethylene glycol (MIRALAX / GLYCOLAX) packet Take 17 g by mouth daily.    Virgel Manifold, MD  sildenafil (REVATIO) 20 MG tablet Take 1 tablet (20 mg total) by mouth as needed for up to 6 doses. 04/16/20   [name], MD  ticagrelor (BRILINTA) 90 MG TABS tablet Take 1 tablet (90 mg total) by mouth 2 (two) times daily. 10/27/19   Loel Dubonnet, NP  zinc sulfate 220 (50  Zn) MG capsule Take 1 capsule (220 mg total) by mouth daily. 10/06/19   Ezekiel Slocumb, DO    Allergies Ace inhibitors  Family History  Problem Relation Age of Onset  . Heart disease Mother   . Heart disease Father     Social History Social History   Tobacco Use  . Smoking status: Former Smoker    Quit date: 03/05/2009    Years since quitting: 11.2  . Smokeless tobacco: Never Used  Vaping Use  . Vaping Use: Never used  Substance Use Topics  . Alcohol use: Yes    Comment: occassionally  . Drug use: Not Currently    Review of Systems Constitutional: No fever/chills Eyes: No visual changes. ENT: No sore throat. Cardiovascular: Denies chest pain. Respiratory: Denies shortness of breath. Gastrointestinal: No abdominal pain.  No nausea, no vomiting.  No diarrhea.  No constipation. Genitourinary: Negative for dysuria. Musculoskeletal: Negative for neck pain.  Negative for back pain. Integumentary: Positive for left buttocks "abscess" Neurological: Negative for headaches, focal weakness or numbness.   ____________________________________________   PHYSICAL EXAM:  VITAL SIGNS: ED Triage Vitals  Enc Vitals Group     BP 06/10/20 1919 140/61     Pulse Rate 06/10/20 1917 79     Resp 06/10/20 1917 18     Temp 06/10/20 1917 99.8 F (37.7 C)     Temp Source 06/10/20 1917 Oral     SpO2 06/10/20 1917 95 %     Weight 06/10/20 1917 90.7 kg (200 lb)     Height 06/10/20 1917 1.88 m (6\' 2" )     Head Circumference --      Peak Flow --      Pain Score 06/10/20 1917 9     Pain Loc --      Pain Edu? --      Excl. in [place]? --     Constitutional: Alert and oriented.  Eyes: Conjunctivae are normal.  Head: Atraumatic. Mouth/Throat: Patient is wearing a mask. Neck: No stridor.  No meningeal signs.   Cardiovascular: Normal rate, regular rhythm. Good peripheral circulation. Grossly normal heart sounds. Respiratory: Normal respiratory effort.  No retractions. Gastrointestinal:  Soft and nontender. No distention.  Musculoskeletal: No lower extremity tenderness nor edema. No gross deformities of extremities. Neurologic:  Normal speech and language. No gross focal neurologic deficits are appreciated.  Skin:  10x7cm blanching erythema with central flocculence left buttocks Psychiatric: Mood and affect are normal. Speech and behavior are normal.  ____________________________________________   LABS (all labs ordered are listed, but only abnormal results are displayed)  Labs Reviewed  COMPREHENSIVE METABOLIC PANEL - Abnormal; Notable for the following components:      Result Value   Glucose, Bld 129 (*)    Calcium 8.4 (*)    Total Protein 8.2 (*)    All other components within normal limits  CBC WITH DIFFERENTIAL/PLATELET - Abnormal; Notable for the following components:   WBC 13.2 (*)  Neutro Abs 10.2 (*)    Monocytes Absolute 1.4 (*)    All other components within normal limits  CREATININE, SERUM  URINALYSIS, COMPLETE (UACMP) WITH MICROSCOPIC  CBC    RADIOLOGY I, [place] N Canary Fister, personally viewed and evaluated these images (plain radiographs) as part of my medical decision making, as well as reviewing the written report by the radiologist.  ED MD interpretation: Focal edema and hyperemia consistent with cellulitis on the left gluteal ultrasound per radiologist.  Official radiology report(s): US PELVIS LIMITED (TRANSABDOMINAL ONLY)  Result Date: 06/11/2020 CLINICAL DATA:  Left buttock mass.  Assess for abscess. EXAM: LIMITED ULTRASOUND OF PELVIS TECHNIQUE: Limited transabdominal ultrasound examination of the pelvis was performed. COMPARISON:  None. FINDINGS: Focal area of edema within open wound is present in the left buttocks. Hyperemia is noted. No discrete or drainable abscess is present. The opening is again is identified. IMPRESSION: 1. No discrete or drainable abscess. 2. Focal edema and hyperemia consistent with cellulitis. Electronically Signed    By: San Morelle M.D.   On: 06/11/2020 04:14    __________  Procedures   ____________________________________________   INITIAL IMPRESSION / MDM / [place] / ED COURSE  As part of my medical decision making, I reviewed the following data within the electronic MEDICAL RECORD NUMBER 64 year old male presented with above-stated history and physical exam consistent with cellulitis and possible gluteal abscess.  Ultrasound revealed no evidence of a gluteal abscess.  Laboratory data noted for white blood cell count of 13.7.  Patient given IV ceftriaxone in the emergency department.  Patient subsequently discussed with Dr. Damita Dunnings for hospital admission for further evaluation and management.  ____________________________________________  FINAL CLINICAL IMPRESSION(S) / ED DIAGNOSES  Final diagnoses:  Abscess  Cellulitis of left buttock     MEDICATIONS GIVEN DURING THIS VISIT:  Medications  enoxaparin (LOVENOX) injection 40 mg (has no administration in time range)  acetaminophen (TYLENOL) tablet 650 mg (650 mg Oral Given 06/11/20 0447)    Or  acetaminophen (TYLENOL) suppository 650 mg ( Rectal See Alternative 06/11/20 0447)  HYDROcodone-acetaminophen (NORCO/VICODIN) 5-325 MG per tablet 1-2 tablet (has no administration in time range)  morphine 2 MG/ML injection 2 mg (has no administration in time range)  ondansetron (ZOFRAN) tablet 4 mg (has no administration in time range)    Or  ondansetron (ZOFRAN) injection 4 mg (has no administration in time range)  cefTRIAXone (ROCEPHIN) 1 g in sodium chloride 0.9 % 100 mL IVPB (has no administration in time range)  cefTRIAXone (ROCEPHIN) 1 g in sodium chloride 0.9 % 100 mL IVPB (1 g Intravenous New Bag/Given 06/11/20 0222)     ED Discharge Orders    None      *Please note:  Dillon [name] was evaluated in Emergency Department on 06/11/2020 for the symptoms described in the history of present illness. He was evaluated in the  context of the global COVID-19 pandemic, which necessitated consideration that the patient might be at risk for infection with the SARS-CoV-2 virus that causes COVID-19. Institutional protocols and algorithms that pertain to the evaluation of patients at risk for COVID-19 are in a state of rapid change based on information released by regulatory bodies including the CDC and federal and state organizations. These policies and algorithms were followed during the patient's care in the ED.  Some ED evaluations and interventions may be delayed as a result of limited staffing during and after the pandemic.*  Note:  This document was prepared using Dragon voice recognition  software and may include unintentional dictation errors.   Gregor Hams, MD 06/11/20 [phone]

## 2020-06-12 DIAGNOSIS — L03317 Cellulitis of buttock: Secondary | ICD-10-CM | POA: Diagnosis not present

## 2020-06-12 LAB — CBC
HCT: 39.3 % (ref 39.0–52.0)
Hemoglobin: 13.9 g/dL (ref 13.0–17.0)
MCH: 33.6 pg (ref 26.0–34.0)
MCHC: 35.4 g/dL (ref 30.0–36.0)
MCV: 94.9 fL (ref 80.0–100.0)
Platelets: 174 10*3/uL (ref 150–400)
RBC: 4.14 MIL/uL — ABNORMAL LOW (ref 4.22–5.81)
RDW: 13.2 % (ref 11.5–15.5)
WBC: 11.1 10*3/uL — ABNORMAL HIGH (ref 4.0–10.5)
nRBC: 0 % (ref 0.0–0.2)

## 2020-06-12 LAB — BASIC METABOLIC PANEL
Anion gap: 9 (ref 5–15)
BUN: 15 mg/dL (ref 8–23)
CO2: 27 mmol/L (ref 22–32)
Calcium: 8.7 mg/dL — ABNORMAL LOW (ref 8.9–10.3)
Chloride: 98 mmol/L (ref 98–111)
Creatinine, Ser: 0.68 mg/dL (ref 0.61–1.24)
GFR calc Af Amer: >60 mL/min (ref >60)
GFR calc non Af Amer: >60 mL/min (ref >60)
Glucose, Bld: 101 mg/dL — ABNORMAL HIGH (ref 70–99)
Potassium: 4.3 mmol/L (ref 3.5–5.1)
Sodium: 134 mmol/L — ABNORMAL LOW (ref 135–145)

## 2020-06-12 MED ORDER — ACETAMINOPHEN 325 MG PO TABS
650.0000 mg | ORAL_TABLET | Freq: Four times a day (QID) | ORAL | Status: DC | PRN
  Administered 2020-06-12: 650 mg via ORAL
  Filled 2020-06-12: qty 2

## 2020-06-12 MED ORDER — SULFAMETHOXAZOLE-TRIMETHOPRIM 800-160 MG PO TABS: 1.0000 | ORAL_TABLET | Freq: Two times a day (BID) | ORAL | 0 refills | Status: AC

## 2020-06-12 NOTE — Discharge Summary (Addendum)
Physician Discharge Summary  Dillon [name] RSW:[id number] DOB: [birth year]   PCP: Dillon Athens, MD  Admit date: 06/11/2020 Discharge date: 06/12/2020 Length of Stay: 0 days   Code Status: Full Code  Admitted From:  Home Discharged to:   [place]:  None  Equipment/Devices:  None Discharge Condition:  Stable  Recommendations for Outpatient Follow-up   1. Follow up with PCP  Hospital Summary  Dillon [name] is a 64 y.o. male with a history of CAD who presented with 4 day history of a boil on his left buttock associated with fever up to 101F day prior to admission with purulent discharge at home who was admitted for purulent cellulitis.   ED Course: On arrival temperature 99.8 with otherwise normal vitals.  WBC 13,000, ultrasound showing no discrete or drainable abscess but showing focal edema and hyperemia consistent with cellulitis.  Patient started on Rocephin.   He had taken tylenol on the morning of 8/23 after admission so it was unclear if he was still having fevers or not. WBC increased from 13 to 15.7. Rocephin was changed to vancomyin. Patient remained afebrile x 24 hours without antipyretics. His WBC trended down to 11.1 and he was discharged in stable condition with Bactrim BID to complete 5 days antibiotics.   A & P   Principal Problem:   Cellulitis of left buttock Active Problems:   Coronary artery disease due to lipid rich plaque   1. Purulent Cellulitis improved a. Bactrim BID for total 5 days antibiotics.    Consultants  . none  Procedures  . none  Antibiotics   Anti-infectives (From admission, onward)   Start     Dose/Rate Route Frequency Ordered Stop   06/12/20 0000  sulfamethoxazole-trimethoprim (BACTRIM DS) 800-160 MG tablet        1 tablet Oral 2 times daily 06/12/20 0836 06/16/20 2359   06/11/20 2100  cefTRIAXone (ROCEPHIN) 1 g in sodium chloride 0.9 % 100 mL IVPB  Status:  Discontinued        1 g 200 mL/hr over 30 Minutes Intravenous Every  24 hours 06/11/20 0427 06/11/20 1021   06/11/20 1830  vancomycin (VANCOCIN) IVPB 1000 mg/200 mL premix        1,000 mg 200 mL/hr over 60 Minutes Intravenous Every 8 hours 06/11/20 1042     06/11/20 1030  vancomycin (VANCOCIN) IVPB 1000 mg/200 mL premix        1,000 mg 200 mL/hr over 60 Minutes Intravenous  Once 06/11/20 1021 06/11/20 1327   06/11/20 0200  cefTRIAXone (ROCEPHIN) 1 g in sodium chloride 0.9 % 100 mL IVPB        1 g 200 mL/hr over 30 Minutes Intravenous  Once 06/11/20 0159 06/11/20 0558       Subjective  Patient seen and examined at bedside no acute distress and resting comfortably.  Wife present. No events overnight.  Tolerating diet. In good spirits and anticipating discharge.   Denies any chest pain, shortness of breath, fever, nausea, vomiting, urinary or bowel complaints. Otherwise ROS negative    Objective   Discharge Exam: Vitals:   06/12/20 0800 06/12/20 0830  BP: 114/76 105/66  Pulse: 63 61  Resp:  16  Temp:    SpO2: 93% 93%   Vitals:   06/12/20 0700 06/12/20 0730 06/12/20 0800 06/12/20 0830  BP: 106/69 111/84 114/76 105/66  Pulse: (!) 58 66 63 61  Resp:    16  Temp:      TempSrc:  SpO2: 96% 93% 93% 93%  Weight:      Height:        Physical Exam Vitals and nursing note reviewed.  Constitutional:      Appearance: Normal appearance.  HENT:     Head: Normocephalic and atraumatic.  Eyes:     Conjunctiva/sclera: Conjunctivae normal.  Cardiovascular:     Rate and Rhythm: Normal rate and regular rhythm.  Pulmonary:     Effort: Pulmonary effort is normal.     Breath sounds: Normal breath sounds.  Abdominal:     General: Abdomen is flat.     Palpations: Abdomen is soft.  Musculoskeletal:        General: No swelling or tenderness.  Skin:    Coloration: Skin is not jaundiced or pale.  Neurological:     Mental Status: He is alert. Mental status is at baseline.  Psychiatric:        Mood and Affect: Mood normal.        Behavior: Behavior  normal.       The results of significant diagnostics from this hospitalization (including imaging, microbiology, ancillary and laboratory) are listed below for reference.     Microbiology: Recent Results (from the past 240 hour(s))  SARS Coronavirus 2 by RT PCR (hospital order, performed in [facility] hospital lab) Nasopharyngeal Nasopharyngeal Swab     Status: None   Collection Time: 06/11/20  5:04 AM   Specimen: Nasopharyngeal Swab  Result Value Ref Range Status   SARS Coronavirus 2 NEGATIVE NEGATIVE Final    Comment: (NOTE) SARS-CoV-2 target nucleic acids are NOT DETECTED.  The SARS-CoV-2 RNA is generally detectable in upper and lower respiratory specimens during the acute phase of infection. The lowest concentration of SARS-CoV-2 viral copies this assay can detect is 250 copies / mL. A negative result does not preclude SARS-CoV-2 infection and should not be used as the sole basis for treatment or other patient management decisions.  A negative result may occur with improper specimen collection / handling, submission of specimen other than nasopharyngeal swab, presence of viral mutation(s) within the areas targeted by this assay, and inadequate number of viral copies (<250 copies / mL). A negative result must be combined with clinical observations, patient history, and epidemiological information.  Fact Sheet for Patients:   StrictlyIdeas.no  Fact Sheet for Healthcare Providers: BankingDealers.co.za  This test is not yet approved or  cleared by the [place] FDA and has been authorized for detection and/or diagnosis of SARS-CoV-2 by FDA under an Emergency Use Authorization (EUA).  This EUA will remain in effect (meaning this test can be used) for the duration of the COVID-19 declaration under Section 564(b)(1) of the Act, 21 U.S.C. section 360bbb-3(b)(1), unless the authorization is terminated or revoked  sooner.  Performed at [facility], [address] [place], [place] [address]      Labs: BNP (last 3 results) No results for input(s): BNP in the last 8760 hours. Basic Metabolic Panel: Recent Labs  Lab 06/10/20 1921 06/11/20 0454 06/12/20 0257  NA 136  --  134*  K 4.0  --  4.3  CL 100  --  98  CO2 25  --  27  GLUCOSE 129*  --  101*  BUN 15  --  15  CREATININE 0.95 0.80 0.68  CALCIUM 8.4*  --  8.7*   Liver Function Tests: Recent Labs  Lab 06/10/20 1921  AST 25  ALT 27  ALKPHOS 93  BILITOT 1.2  PROT  8.2*  ALBUMIN 3.6   No results for input(s): LIPASE, AMYLASE in the last 168 hours. No results for input(s): AMMONIA in the last 168 hours. CBC: Recent Labs  Lab 06/10/20 1921 06/11/20 0849 06/12/20 0257  WBC 13.2* 15.7* 11.1*  NEUTROABS 10.2*  --   --   HGB 14.2 13.9 13.9  HCT 42.1 40.7 39.3  MCV 98.6 97.4 94.9  PLT 178 184 174   Cardiac Enzymes: No results for input(s): CKTOTAL, CKMB, CKMBINDEX, TROPONINI in the last 168 hours. BNP: Invalid input(s): POCBNP CBG: No results for input(s): GLUCAP in the last 168 hours. D-Dimer No results for input(s): DDIMER in the last 72 hours. Hgb A1c No results for input(s): HGBA1C in the last 72 hours. Lipid Profile No results for input(s): CHOL, HDL, LDLCALC, TRIG, CHOLHDL, LDLDIRECT in the last 72 hours. Thyroid function studies No results for input(s): TSH, T4TOTAL, T3FREE, THYROIDAB in the last 72 hours.  Invalid input(s): FREET3 Anemia work up No results for input(s): VITAMINB12, FOLATE, FERRITIN, TIBC, IRON, RETICCTPCT in the last 72 hours. Urinalysis    Component Value Date/Time   COLORURINE YELLOW (A) 06/10/2020 0855   APPEARANCEUR HAZY (A) 06/10/2020 0855   APPEARANCEUR Clear 03/06/2014 0058   LABSPEC 1.016 06/10/2020 0855   LABSPEC 1.025 03/06/2014 0058   PHURINE 6.0 06/10/2020 0855   GLUCOSEU NEGATIVE 06/10/2020 0855   GLUCOSEU Negative 03/06/2014 0058   HGBUR SMALL (A) 06/10/2020  0855   BILIRUBINUR NEGATIVE 06/10/2020 0855   BILIRUBINUR Negative 03/06/2014 0058   KETONESUR NEGATIVE 06/10/2020 0855   PROTEINUR NEGATIVE 06/10/2020 0855   NITRITE NEGATIVE 06/10/2020 0855   LEUKOCYTESUR NEGATIVE 06/10/2020 0855   LEUKOCYTESUR Negative 03/06/2014 0058   Sepsis Labs Invalid input(s): PROCALCITONIN,  WBC,  LACTICIDVEN Microbiology Recent Results (from the past 240 hour(s))  SARS Coronavirus 2 by RT PCR (hospital order, performed in [place] hospital lab) Nasopharyngeal Nasopharyngeal Swab     Status: None   Collection Time: 06/11/20  5:04 AM   Specimen: Nasopharyngeal Swab  Result Value Ref Range Status   SARS Coronavirus 2 NEGATIVE NEGATIVE Final    Comment: (NOTE) SARS-CoV-2 target nucleic acids are NOT DETECTED.  The SARS-CoV-2 RNA is generally detectable in upper and lower respiratory specimens during the acute phase of infection. The lowest concentration of SARS-CoV-2 viral copies this assay can detect is 250 copies / mL. A negative result does not preclude SARS-CoV-2 infection and should not be used as the sole basis for treatment or other patient management decisions.  A negative result may occur with improper specimen collection / handling, submission of specimen other than nasopharyngeal swab, presence of viral mutation(s) within the areas targeted by this assay, and inadequate number of viral copies (<250 copies / mL). A negative result must be combined with clinical observations, patient history, and epidemiological information.  Fact Sheet for Patients:   StrictlyIdeas.no  Fact Sheet for Healthcare Providers: BankingDealers.co.za  This test is not yet approved or  cleared by the [place] FDA and has been authorized for detection and/or diagnosis of SARS-CoV-2 by FDA under an Emergency Use Authorization (EUA).  This EUA will remain in effect (meaning this test can be used) for the duration of  the COVID-19 declaration under Section 564(b)(1) of the Act, 21 U.S.C. section 360bbb-3(b)(1), unless the authorization is terminated or revoked sooner.  Performed at [facility], [address], [place], [place] [address]     Discharge Instructions     Discharge Instructions    Diet -  low sodium heart healthy   Complete by: As directed    Discharge instructions   Complete by: As directed    - Take Bactrim twice daily for the next 4 days.  - If your symptoms do not resolve or worsen then contact your primary care physician or return to the ED   Increase activity slowly   Complete by: As directed      Allergies as of 06/12/2020      Reactions   Ace Inhibitors Swelling   Causes cough and swelling      Medication List    TAKE these medications   albuterol 108 (90 Base) MCG/ACT inhaler Commonly known as: VENTOLIN HFA Inhale 1-2 puffs into the lungs every 6 (six) hours as needed for wheezing or shortness of breath.   Anoro Ellipta 62.5-25 MCG/INH Aepb Generic drug: umeclidinium-vilanterol Inhale 1 puff into the lungs daily.   ascorbic acid 500 MG tablet Commonly known as: VITAMIN C Take 1 tablet (500 mg total) by mouth daily.   Aspirin Buf(CaCarb-MgCarb-MgO) 81 MG Tabs Take 1 tablet by mouth daily.   atorvastatin 40 MG tablet Commonly known as: LIPITOR Take 40 mg by mouth daily.   carvedilol 3.125 MG tablet Commonly known as: COREG TAKE 1 TABLET (3.125 MG TOTAL) BY MOUTH 2 (TWO) TIMES DAILY WITH A MEAL.   feeding supplement (ENSURE ENLIVE) Liqd Take 237 mLs by mouth 3 (three) times daily between meals. What changed: when to take this   fexofenadine 180 MG tablet Commonly known as: ALLEGRA Take 180 mg by mouth daily.   nitroGLYCERIN 0.3 MG SL tablet Commonly known as: NITROSTAT Place 1 tablet (0.3 mg total) under the tongue every 5 (five) minutes as needed for chest pain.   polyethylene glycol 17 g packet Commonly known as: MIRALAX /  GLYCOLAX Take 17 g by mouth daily.   sildenafil 20 MG tablet Commonly known as: REVATIO Take 1 tablet (20 mg total) by mouth as needed for up to 6 doses.   sulfamethoxazole-trimethoprim 800-160 MG tablet Commonly known as: Bactrim DS Take 1 tablet by mouth 2 (two) times daily for 4 days.   ticagrelor 90 MG Tabs tablet Commonly known as: BRILINTA Take 1 tablet (90 mg total) by mouth 2 (two) times daily.   Tussionex Pennkinetic ER 10-8 MG/5ML [place] Generic drug: chlorpheniramine-HYDROcodone Take 5 mLs by mouth every 12 (twelve) hours as needed for cough.   zinc sulfate 220 (50 Zn) MG capsule Take 1 capsule (220 mg total) by mouth daily.       Allergies  Allergen Reactions  . Ace Inhibitors Swelling    Causes cough and swelling     Time coordinating discharge: under 30 minutes   SIGNED:   Harold Hedge, D.O. [facility] Pager: [phone]  06/12/2020, 11:51 AM

## 2020-06-18 ENCOUNTER — Other Ambulatory Visit: Payer: Self-pay

## 2020-06-18 ENCOUNTER — Encounter: Payer: Self-pay | Admitting: Internal Medicine

## 2020-06-18 ENCOUNTER — Ambulatory Visit: Payer: 59 | Admitting: Internal Medicine

## 2020-06-18 VITALS — BP 128/80 | HR 66 | Ht 74.0 in | Wt 198.0 lb

## 2020-06-18 DIAGNOSIS — I2583 Coronary atherosclerosis due to lipid rich plaque: Secondary | ICD-10-CM

## 2020-06-18 DIAGNOSIS — J8410 Pulmonary fibrosis, unspecified: Secondary | ICD-10-CM | POA: Diagnosis not present

## 2020-06-18 DIAGNOSIS — I2510 Atherosclerotic heart disease of native coronary artery without angina pectoris: Secondary | ICD-10-CM

## 2020-06-18 DIAGNOSIS — L03317 Cellulitis of buttock: Secondary | ICD-10-CM | POA: Diagnosis not present

## 2020-06-18 DIAGNOSIS — L0231 Cutaneous abscess of buttock: Secondary | ICD-10-CM

## 2020-06-18 MED ORDER — CEPHALEXIN 500 MG PO CAPS: 500.0000 mg | ORAL_CAPSULE | Freq: Four times a day (QID) | ORAL | 0 refills | Status: DC

## 2020-06-18 NOTE — Assessment & Plan Note (Signed)
Pt was seen as a follow up for cellulitis of the left buttock. He drained the abscess more than 1 week ago at home, and then he was seen in the hospital where they put him on Bactrim BID x4 days. Pt still has a lot of drainage, so I switched antibiotics to kefflax 500 mg QID x10. He will see me in 5 days for follow up. If he is not better we will send him to a general surgeon for check up.

## 2020-06-18 NOTE — Assessment & Plan Note (Signed)
Chronic problem, but stable at the present time.

## 2020-06-18 NOTE — Assessment & Plan Note (Signed)
Denies chest pain or shortness of breath

## 2020-06-18 NOTE — Addendum Note (Signed)
Addended by: [name] on: 06/18/2020 05:38 PM   Modules accepted: Level of Service

## 2020-06-18 NOTE — Progress Notes (Signed)
Established Patient Office Visit  SUBJECTIVE:  Subjective  Patient ID: Dillon [name], male    DOB: [birth year]  Age: 64 y.o. MRN: [id number]  CC:  Chief Complaint  Patient presents with  . Follow-up    cellulitis of left buttock, was given bactrim x 4 days     HPI Dillon [name] is a 64 y.o. male presenting today for a hospital follow up for his cellulitis.    He presented to the [facility] ED on 06/11/2020 and was admitted until 0/23/2021 for cellulitis of the left buttock. On arrival, he noted a boil on his buttock for 4 days that had an associated fever of 101F with purulent discharge. Pelvic US revealed no discrete or drainable abscess; focal edema and hyperemia consistent with cellulitis. WBC increased from 13 to 15.7. He was given Rocephin and changed to vancomycin. WBC trended down to 11.1 and he was discharged with Bactrim BID x5 days.   He completed his antibiotic treatment, but he still has some concerns because he states that the wound is still open. He denies fevers, chills, or swelling.  Past Medical History:  Diagnosis Date  . Coronary artery disease   . Elevated cholesterol   . Restless leg syndrome     Past Surgical History:  Procedure Laterality Date  . ANGIOPLASTY  2010   2 stents placed  . BACK SURGERY    . COLONOSCOPY WITH PROPOFOL N/A 03/05/2018   Procedure: COLONOSCOPY WITH PROPOFOL;  Surgeon: Virgel Manifold, MD;  Location: [facility] ENDOSCOPY;  Service: Endoscopy;  Laterality: N/A;  . CORONARY ANGIOPLASTY    . CORONARY/GRAFT ACUTE MI REVASCULARIZATION N/A 09/30/2019   Procedure: Coronary/Graft Acute MI Revascularization;  Surgeon: Wellington Hampshire, MD;  Location: [place] [facility];  Service: Cardiovascular;  Laterality: N/A;  . LEFT HEART CATH AND CORONARY ANGIOGRAPHY N/A 09/30/2019   Procedure: LEFT HEART CATH AND CORONARY ANGIOGRAPHY;  Surgeon: Wellington Hampshire, MD;  Location: [place] [facility];  Service: Cardiovascular;  Laterality: N/A;    Family  History  Problem Relation Age of Onset  . Heart disease Mother   . Heart disease Father     Social History   Socioeconomic History  . Marital status: Married    Spouse name: Not on file  . Number of children: Not on file  . Years of education: Not on file  . Highest education level: Not on file  Occupational History  . Not on file  Tobacco Use  . Smoking status: Former Smoker    Quit date: 03/05/2009    Years since quitting: 11.2  . Smokeless tobacco: Never Used  Vaping Use  . Vaping Use: Never used  Substance and Sexual Activity  . Alcohol use: Yes    Comment: occassionally  . Drug use: Not Currently  . Sexual activity: Not on file  Other Topics Concern  . Not on file  Social History Narrative  . Not on file   Social Determinants of Health   Financial Resource Strain:   . Difficulty of Paying Living Expenses: Not on file  Food Insecurity:   . Worried About Charity fundraiser in the Last Year: Not on file  . Ran Out of Food in the Last Year: Not on file  Transportation Needs:   . Lack of Transportation (Medical): Not on file  . Lack of Transportation (Non-Medical): Not on file  Physical Activity:   . Days of Exercise per Week: Not on file  . Minutes of  Exercise per Session: Not on file  Stress:   . Feeling of Stress : Not on file  Social Connections:   . Frequency of Communication with Friends and Family: Not on file  . Frequency of Social Gatherings with Friends and Family: Not on file  . Attends Religious Services: Not on file  . Active Member of Clubs or Organizations: Not on file  . Attends Archivist Meetings: Not on file  . Marital Status: Not on file  Intimate Partner Violence:   . Fear of Current or Ex-Partner: Not on file  . Emotionally Abused: Not on file  . Physically Abused: Not on file  . Sexually Abused: Not on file     Current Outpatient Medications:  .  albuterol (VENTOLIN HFA) 108 (90 Base) MCG/ACT inhaler, Inhale 1-2 puffs  into the lungs every 6 (six) hours as needed for wheezing or shortness of breath., Disp: , Rfl:  .  ANORO ELLIPTA 62.5-25 MCG/INH AEPB, Inhale 1 puff into the lungs daily., Disp: , Rfl:  .  ascorbic acid (VITAMIN C) 500 MG tablet, Take 1 tablet (500 mg total) by mouth daily., Disp:  , Rfl:  .  Aspirin Buf,CaCarb-MgCarb-MgO, 81 MG TABS, Take 1 tablet by mouth daily., Disp: , Rfl:  .  atorvastatin (LIPITOR) 40 MG tablet, Take 40 mg by mouth daily., Disp: , Rfl: 8 .  carvedilol (COREG) 3.125 MG tablet, TAKE 1 TABLET (3.125 MG TOTAL) BY MOUTH 2 (TWO) TIMES DAILY WITH A MEAL., Disp: 60 tablet, Rfl: 1 .  chlorpheniramine-HYDROcodone (TUSSIONEX PENNKINETIC ER) 10-8 MG/5ML SUER, Take 5 mLs by mouth every 12 (twelve) hours as needed for cough., Disp: , Rfl:  .  feeding supplement, ENSURE ENLIVE, (ENSURE ENLIVE) LIQD, Take 237 mLs by mouth 3 (three) times daily between meals. (Patient taking differently: Take 237 mLs by mouth daily. ), Disp: 237 mL, Rfl: 12 .  fexofenadine (ALLEGRA) 180 MG tablet, Take 180 mg by mouth daily., Disp: , Rfl:  .  nitroGLYCERIN (NITROSTAT) 0.3 MG SL tablet, Place 1 tablet (0.3 mg total) under the tongue every 5 (five) minutes as needed for chest pain., Disp: 25 tablet, Rfl: 3 .  polyethylene glycol (MIRALAX / GLYCOLAX) packet, Take 17 g by mouth daily. , Disp: , Rfl:  .  sildenafil (REVATIO) 20 MG tablet, Take 1 tablet (20 mg total) by mouth as needed for up to 6 doses., Disp: 10 tablet, Rfl: 0 .  ticagrelor (BRILINTA) 90 MG TABS tablet, Take 1 tablet (90 mg total) by mouth 2 (two) times daily., Disp: 60 tablet, Rfl: 2 .  zinc sulfate 220 (50 Zn) MG capsule, Take 1 capsule (220 mg total) by mouth daily., Disp: 5 capsule, Rfl: 0 .  cephALEXin (KEFLEX) 500 MG capsule, Take 1 capsule (500 mg total) by mouth 4 (four) times daily., Disp: 40 capsule, Rfl: 0   Allergies  Allergen Reactions  . Ace Inhibitors Swelling    Causes cough and swelling    ROS Review of Systems    Constitutional: Negative.  Negative for chills, diaphoresis and fever.  HENT: Negative.   Eyes: Negative.   Respiratory: Negative.   Cardiovascular: Negative.   Gastrointestinal: Negative.   Endocrine: Negative.   Genitourinary: Negative.   Musculoskeletal: Negative.  Negative for myalgias.  Skin: Positive for wound (left buttock).  Allergic/Immunologic: Negative.   Neurological: Negative.   Hematological: Negative.   Psychiatric/Behavioral: Negative.   All other systems reviewed and are negative.  06/11/2020   06/12/2020  OBJECTIVE:    Physical Exam Vitals reviewed.  Constitutional:      Appearance: Normal appearance.  HENT:     Mouth/Throat:     Mouth: Mucous membranes are moist.  Eyes:     Pupils: Pupils are equal, round, and reactive to light.  Neck:     Vascular: No carotid bruit.  Cardiovascular:     Rate and Rhythm: Normal rate and regular rhythm.     Pulses: Normal pulses.     Heart sounds: Normal heart sounds.  Pulmonary:     Effort: Pulmonary effort is normal.     Breath sounds: Normal breath sounds.  Abdominal:     General: Bowel sounds are normal.     Palpations: Abdomen is soft. There is no hepatomegaly, splenomegaly or mass.     Tenderness: There is no abdominal tenderness.     Hernia: No hernia is present.  Musculoskeletal:     Cervical back: Neck supple.     Right lower leg: No edema.     Left lower leg: No edema.  Skin:    Findings: Abscess (left buttock) present. No rash.       Neurological:     Mental Status: He is alert and oriented to person, place, and time.     Motor: No weakness.  Psychiatric:        Mood and Affect: Mood normal.        Behavior: Behavior normal.     BP 128/80   Pulse 66   Ht 6\' 2"  (1.88 m)   Wt 198 lb (89.8 kg)   BMI 25.42 kg/m  Wt Readings from Last 3 Encounters:  06/18/20 198 lb (89.8 kg)  06/10/20 200 lb (90.7 kg)  05/11/20 197 lb 6.4 oz (89.5 kg)    Health Maintenance Due  Topic Date Due   . Hepatitis C Screening  Never done  . COVID-19 Vaccine (1) Never done  . TETANUS/TDAP  Never done  . INFLUENZA VACCINE  05/20/2020    There are no preventive care reminders to display for this patient.  CBC Latest Ref Rng & Units 06/12/2020 06/11/2020 06/10/2020  WBC 4.0 - 10.5 K/uL 11.1(H) 15.7(H) 13.2(H)  Hemoglobin 13.0 - 17.0 g/dL 13.9 13.9 14.2  Hematocrit 39 - 52 % 39.3 40.7 42.1  Platelets 150 - 400 K/uL 174 184 178   CMP Latest Ref Rng & Units 06/12/2020 06/11/2020 06/10/2020  Glucose 70 - 99 mg/dL 101(H) - 129(H)  BUN 8 - 23 mg/dL 15 - 15  Creatinine 0.61 - 1.24 mg/dL 0.68 0.80 0.95  Sodium 135 - 145 mmol/L 134(L) - 136  Potassium 3.5 - 5.1 mmol/L 4.3 - 4.0  Chloride 98 - 111 mmol/L 98 - 100  CO2 22 - 32 mmol/L 27 - 25  Calcium 8.9 - 10.3 mg/dL 8.7(L) - 8.4(L)  Total Protein 6.5 - 8.1 g/dL - - 8.2(H)  Total Bilirubin 0.3 - 1.2 mg/dL - - 1.2  Alkaline Phos 38 - 126 U/L - - 93  AST 15 - 41 U/L - - 25  ALT 0 - 44 U/L - - 27    No results found for: TSH Lab Results  Component Value Date   ALBUMIN 3.6 06/10/2020   ANIONGAP 9 06/12/2020   Lab Results  Component Value Date   CHOL 103 10/02/2019   HDL 33 (L) 10/02/2019   LDLCALC 64 10/02/2019   CHOLHDL 3.1 10/02/2019   Lab Results  Component Value Date   TRIG 30 10/02/2019  No results found for: HGBA1C    ASSESSMENT & PLAN:   Problem List Items Addressed This Visit      Cardiovascular and Mediastinum   Coronary artery disease due to lipid rich plaque    Denies chest pain or shortness of breath.         Respiratory   Pulmonary fibrosis (HCC)    Chronic problem, but stable at the present time.         Other   Cellulitis of left buttock    Pt was seen as a follow up for cellulitis of the left buttock. He drained the abscess more than 1 week ago at home, and then he was seen in the hospital where they put him on Bactrim BID x4 days. Pt still has a lot of drainage, so I switched antibiotics to kefflax 500  mg QID x10. He will see me in 5 days for follow up. If he is not better we will send him to a general surgeon for check up.        Other Visit Diagnoses    Cellulitis and abscess of buttock    -  Primary   Relevant Medications   cephALEXin (KEFLEX) 500 MG capsule      Meds ordered this encounter  Medications  . cephALEXin (KEFLEX) 500 MG capsule    Sig: Take 1 capsule (500 mg total) by mouth 4 (four) times daily.    Dispense:  40 capsule    Refill:  0    Follow-up: No follow-ups on file.    Dr. Jane Canary [facility] [address], [place], [place] [address]   By signing my name below, I, [employer], attest that this documentation has been prepared under the direction and in the presence of [name], MD. Electronically Signed: [name], MD 06/18/20, 12:11 PM   I personally performed the services described in this documentation, which was SCRIBED in my presence. The recorded information has been reviewed and considered accurate. It has been edited as necessary during review. [name], MD

## 2020-06-22 ENCOUNTER — Ambulatory Visit (INDEPENDENT_AMBULATORY_CARE_PROVIDER_SITE_OTHER): Payer: 59 | Admitting: Internal Medicine

## 2020-06-22 ENCOUNTER — Encounter: Payer: Self-pay | Admitting: Internal Medicine

## 2020-06-22 ENCOUNTER — Other Ambulatory Visit: Payer: Self-pay

## 2020-06-22 VITALS — BP 123/71 | HR 72 | Ht 74.0 in | Wt 195.7 lb

## 2020-06-22 DIAGNOSIS — J8410 Pulmonary fibrosis, unspecified: Secondary | ICD-10-CM

## 2020-06-22 DIAGNOSIS — M67912 Unspecified disorder of synovium and tendon, left shoulder: Secondary | ICD-10-CM

## 2020-06-22 DIAGNOSIS — L03317 Cellulitis of buttock: Secondary | ICD-10-CM | POA: Diagnosis not present

## 2020-06-22 DIAGNOSIS — I2583 Coronary atherosclerosis due to lipid rich plaque: Secondary | ICD-10-CM | POA: Diagnosis not present

## 2020-06-22 DIAGNOSIS — I2510 Atherosclerotic heart disease of native coronary artery without angina pectoris: Secondary | ICD-10-CM

## 2020-06-22 NOTE — Assessment & Plan Note (Signed)
Pt denies any chest pain. He is taking anti cholesterol mediation.

## 2020-06-22 NOTE — Assessment & Plan Note (Signed)
Stable at the present time. 

## 2020-06-22 NOTE — Progress Notes (Signed)
Established Patient Office Visit  SUBJECTIVE:  Subjective  Patient ID: Dillon [name], male    DOB: May 25, 1956  Age: 64 y.o. MRN: [id number]  CC:  Chief Complaint  Patient presents with  . abscess of buttock    5 day follow up for recheck     HPI Dillon [name] is a 64 y.o. male presenting today for a 5 day follow-up to the abscess of his buttock.   He believes it to be healing well, but there is some dead tissue that seems to be hanging out that he thinks needs to be dealt with. He declines any other issues.    Past Medical History:  Diagnosis Date  . Coronary artery disease   . Elevated cholesterol   . Restless leg syndrome     Past Surgical History:  Procedure Laterality Date  . ANGIOPLASTY  2010   2 stents placed  . BACK SURGERY    . COLONOSCOPY WITH PROPOFOL N/A 03/05/2018   Procedure: COLONOSCOPY WITH PROPOFOL;  Surgeon: Virgel Manifold, MD;  Location: [facility] ENDOS[facility];  Service: Endoscopy;  Laterality: N/A;  . CORONARY ANGIOPLASTY    . CORONARY/GRAFT ACUTE MI REVASCULARIZATION N/A 09/30/2019   Procedure: Coronary/Graft Acute MI Revascularization;  Surgeon: Wellington Hampshire, MD;  Location: [place] [facility];  Service: Cardiovascular;  Laterality: N/A;  . LEFT HEART CATH AND CORONARY ANGIOGRAPHY N/A 09/30/2019   Procedure: LEFT HEART CATH AND CORONARY ANGIOGRAPHY;  Surgeon: Wellington Hampshire, MD;  Location: [place] [facility];  Service: Cardiovascular;  Laterality: N/A;    Family History  Problem Relation Age of Onset  . Heart disease Mother   . Heart disease Father     Social History   Socioeconomic History  . Marital status: Married    Spouse name: Not on file  . Number of children: Not on file  . Years of education: Not on file  . Highest education level: Not on file  Occupational History  . Not on file  Tobacco Use  . Smoking status: Former Smoker    Quit date: 03/05/2009    Years since quitting: 11.3  . Smokeless tobacco: Never Used    Vaping Use  . Vaping Use: Never used  Substance and Sexual Activity  . Alcohol use: Yes    Comment: occassionally  . Drug use: Not Currently  . Sexual activity: Not on file  Other Topics Concern  . Not on file  Social History Narrative  . Not on file   Social Determinants of Health   Financial Resource Strain:   . Difficulty of Paying Living Expenses: Not on file  Food Insecurity:   . Worried About Charity fundraiser in the Last Year: Not on file  . Ran Out of Food in the Last Year: Not on file  Transportation Needs:   . Lack of Transportation (Medical): Not on file  . Lack of Transportation (Non-Medical): Not on file  Physical Activity:   . Days of Exercise per Week: Not on file  . Minutes of Exercise per Session: Not on file  Stress:   . Feeling of Stress : Not on file  Social Connections:   . Frequency of Communication with Friends and Family: Not on file  . Frequency of Social Gatherings with Friends and Family: Not on file  . Attends Religious Services: Not on file  . Active Member of Clubs or Organizations: Not on file  . Attends Archivist Meetings: Not on file  .  Marital Status: Not on file  Intimate Partner Violence:   . Fear of Current or Ex-Partner: Not on file  . Emotionally Abused: Not on file  . Physically Abused: Not on file  . Sexually Abused: Not on file     Current Outpatient Medications:  .  albuterol (VENTOLIN HFA) 108 (90 Base) MCG/ACT inhaler, Inhale 1-2 puffs into the lungs every 6 (six) hours as needed for wheezing or shortness of breath., Disp: , Rfl:  .  ANORO ELLIPTA 62.5-25 MCG/INH AEPB, Inhale 1 puff into the lungs daily., Disp: , Rfl:  .  ascorbic acid (VITAMIN C) 500 MG tablet, Take 1 tablet (500 mg total) by mouth daily., Disp:  , Rfl:  .  Aspirin Buf,CaCarb-MgCarb-MgO, 81 MG TABS, Take 1 tablet by mouth daily., Disp: , Rfl:  .  atorvastatin (LIPITOR) 40 MG tablet, Take 40 mg by mouth daily., Disp: , Rfl: 8 .  carvedilol  (COREG) 3.125 MG tablet, TAKE 1 TABLET (3.125 MG TOTAL) BY MOUTH 2 (TWO) TIMES DAILY WITH A MEAL., Disp: 60 tablet, Rfl: 1 .  cephALEXin (KEFLEX) 500 MG capsule, Take 1 capsule (500 mg total) by mouth 4 (four) times daily., Disp: 40 capsule, Rfl: 0 .  chlorpheniramine-HYDROcodone (TUSSIONEX PENNKINETIC ER) 10-8 MG/5ML SUER, Take 5 mLs by mouth every 12 (twelve) hours as needed for cough., Disp: , Rfl:  .  feeding supplement, ENSURE ENLIVE, (ENSURE ENLIVE) LIQD, Take 237 mLs by mouth 3 (three) times daily between meals. (Patient taking differently: Take 237 mLs by mouth daily. ), Disp: 237 mL, Rfl: 12 .  fexofenadine (ALLEGRA) 180 MG tablet, Take 180 mg by mouth daily., Disp: , Rfl:  .  nitroGLYCERIN (NITROSTAT) 0.3 MG SL tablet, Place 1 tablet (0.3 mg total) under the tongue every 5 (five) minutes as needed for chest pain., Disp: 25 tablet, Rfl: 3 .  polyethylene glycol (MIRALAX / GLYCOLAX) packet, Take 17 g by mouth daily. , Disp: , Rfl:  .  sildenafil (REVATIO) 20 MG tablet, Take 1 tablet (20 mg total) by mouth as needed for up to 6 doses., Disp: 10 tablet, Rfl: 0 .  ticagrelor (BRILINTA) 90 MG TABS tablet, Take 1 tablet (90 mg total) by mouth 2 (two) times daily., Disp: 60 tablet, Rfl: 2 .  zinc sulfate 220 (50 Zn) MG capsule, Take 1 capsule (220 mg total) by mouth daily., Disp: 5 capsule, Rfl: 0   Allergies  Allergen Reactions  . Ace Inhibitors Swelling    Causes cough and swelling    ROS Review of Systems  Constitutional: Negative.   HENT: Negative.   Eyes: Negative.   Respiratory: Negative.   Cardiovascular: Negative.   Gastrointestinal: Negative.   Endocrine: Negative.   Genitourinary: Negative.   Musculoskeletal: Negative.   Skin: Negative.   Allergic/Immunologic: Negative.   Neurological: Negative.   Hematological: Negative.   Psychiatric/Behavioral: Negative.   All other systems reviewed and are negative.    OBJECTIVE:    Physical Exam Vitals reviewed.    Constitutional:      Appearance: Normal appearance.  HENT:     Mouth/Throat:     Mouth: Mucous membranes are moist.  Eyes:     Pupils: Pupils are equal, round, and reactive to light.  Neck:     Vascular: No carotid bruit.  Cardiovascular:     Rate and Rhythm: Normal rate and regular rhythm.     Pulses: Normal pulses.     Heart sounds: Normal heart sounds.  Pulmonary:  Effort: Pulmonary effort is normal.     Breath sounds: Normal breath sounds.  Abdominal:     General: Bowel sounds are normal.     Palpations: Abdomen is soft. There is no hepatomegaly, splenomegaly or mass.     Tenderness: There is no abdominal tenderness.     Hernia: No hernia is present.  Musculoskeletal:     Cervical back: Neck supple.     Right lower leg: No edema.     Left lower leg: No edema.  Skin:    Findings: Wound (left buttock) present. No rash.  Neurological:     Mental Status: He is alert and oriented to person, place, and time.     Motor: No weakness.  Psychiatric:        Mood and Affect: Mood normal.        Behavior: Behavior normal.     BP 123/71   Pulse 72   Ht 6\' 2"  (1.88 m)   Wt 195 lb 11.2 oz (88.8 kg)   BMI 25.13 kg/m  Wt Readings from Last 3 Encounters:  06/22/20 195 lb 11.2 oz (88.8 kg)  06/18/20 198 lb (89.8 kg)  06/10/20 200 lb (90.7 kg)    Health Maintenance Due  Topic Date Due  . Hepatitis C Screening  Never done  . COVID-19 Vaccine (1) Never done  . TETANUS/TDAP  Never done  . INFLUENZA VACCINE  Never done    There are no preventive care reminders to display for this patient.  CBC Latest Ref Rng & Units 06/12/2020 06/11/2020 06/10/2020  WBC 4.0 - 10.5 K/uL 11.1(H) 15.7(H) 13.2(H)  Hemoglobin 13.0 - 17.0 g/dL 13.9 13.9 14.2  Hematocrit 39 - 52 % 39.3 40.7 42.1  Platelets 150 - 400 K/uL 174 184 178   CMP Latest Ref Rng & Units 06/12/2020 06/11/2020 06/10/2020  Glucose 70 - 99 mg/dL 101(H) - 129(H)  BUN 8 - 23 mg/dL 15 - 15  Creatinine 0.61 - 1.24 mg/dL 0.68  0.80 0.95  Sodium 135 - 145 mmol/L 134(L) - 136  Potassium 3.5 - 5.1 mmol/L 4.3 - 4.0  Chloride 98 - 111 mmol/L 98 - 100  CO2 22 - 32 mmol/L 27 - 25  Calcium 8.9 - 10.3 mg/dL 8.7(L) - 8.4(L)  Total Protein 6.5 - 8.1 g/dL - - 8.2(H)  Total Bilirubin 0.3 - 1.2 mg/dL - - 1.2  Alkaline Phos 38 - 126 U/L - - 93  AST 15 - 41 U/L - - 25  ALT 0 - 44 U/L - - 27    No results found for: TSH Lab Results  Component Value Date   ALBUMIN 3.6 06/10/2020   ANIONGAP 9 06/12/2020   Lab Results  Component Value Date   CHOL 103 10/02/2019   HDL 33 (L) 10/02/2019   LDLCALC 64 10/02/2019   CHOLHDL 3.1 10/02/2019   Lab Results  Component Value Date   TRIG 30 10/02/2019   No results found for: HGBA1C    ASSESSMENT & PLAN:   Problem List Items Addressed This Visit    None      No orders of the defined types were placed in this encounter.   Follow-up: No follow-ups on file.    I & D  Date/Time: 06/22/2020 5:27 PM Performed by: [name], MD Authorized by: [name], MD   Consent:    Consent obtained:  Verbal   Consent given by:  Patient   Risks discussed:  Bleeding, incomplete drainage, pain and infection Location:  Type:  Abscess   Location:  Lower extremity   Lower extremity location:  Buttock   Buttock location:  L buttock Pre-procedure details:    Skin preparation:  Betadine Anesthesia (see MAR for exact dosages):    Anesthesia method:  None Procedure type:    Complexity:  Simple Procedure details:    Needle aspiration: no     Incision depth:  Subcutaneous   Wound management:  Probed and deloculated and debrided   Drainage amount:  Scant   Wound treatment:  Wound left open   Packing materials:  None Post-procedure details:    Patient tolerance of procedure:  Tolerated well, no immediate complications Comments:     Pt's wound was cleaned with betadine and dead tissue, about 2 cm, was debrided from the wound and wound was opened with scissors. Bleeding was  very scanty. Dressing was applied. Pt tolerated the procedure well.       Dr. Jane Canary [facility] [address] [place], [place] [address]   By signing my name below, I, [employer], attest that this documentation has been prepared under the direction and in the presence of [name], MD. Electronically Signed: Franchot Erichsen 06/22/20, 9:48 AM   I personally performed the services described in this documentation, which was SCRIBED in my presence. The recorded information has been reviewed and considered accurate. It has been edited as necessary during review. [name], MD

## 2020-06-22 NOTE — Assessment & Plan Note (Signed)
Pt is under care of pulmonologist.

## 2020-06-22 NOTE — Assessment & Plan Note (Signed)
Pt has cellulitis of the left buttock. On examination, he had dead skin which was pulled and cut, and most of it was taken off from the wound. The wound is slowly healing. Swelling around the wound has gone down. The wound was dressed after the debridement. No packing was done. Size of the drainage site is about 2 cm.

## 2020-06-28 ENCOUNTER — Encounter: Payer: Self-pay | Admitting: Internal Medicine

## 2020-06-28 ENCOUNTER — Other Ambulatory Visit: Payer: Self-pay

## 2020-06-28 ENCOUNTER — Ambulatory Visit (INDEPENDENT_AMBULATORY_CARE_PROVIDER_SITE_OTHER): Payer: 59 | Admitting: Internal Medicine

## 2020-06-28 VITALS — BP 116/75 | HR 71 | Wt 197.5 lb

## 2020-06-28 DIAGNOSIS — J8410 Pulmonary fibrosis, unspecified: Secondary | ICD-10-CM

## 2020-06-28 DIAGNOSIS — M67912 Unspecified disorder of synovium and tendon, left shoulder: Secondary | ICD-10-CM

## 2020-06-28 DIAGNOSIS — L03317 Cellulitis of buttock: Secondary | ICD-10-CM

## 2020-06-28 NOTE — Assessment & Plan Note (Signed)
Patient denies any pain in the left or right shoulder.

## 2020-06-28 NOTE — Assessment & Plan Note (Signed)
Pulmonary fibrosis is stable. Chest examination revealed no rales of rhonchi

## 2020-06-28 NOTE — Assessment & Plan Note (Signed)
Cellulitis of the left buttock is healing satisfactorily. He was advised to come back in one week

## 2020-06-28 NOTE — Progress Notes (Signed)
Established Patient Office Visit  SUBJECTIVE:  Subjective  Patient ID: Dillon [name], male    DOB: 12-28-1955  Age: 64 y.o. MRN: [id number]  CC:  Chief Complaint  Patient presents with  . Cellulitis    6 day follow up from cellulitis    HPI Dillon [name] is a 64 y.o. male presenting today for a follow up of his cellulitis.   His wound is healing well. He denies fevers or chills. He is taking his antibiotics as prescribed. He has two days left of antibiotic.    Past Medical History:  Diagnosis Date  . Coronary artery disease   . Elevated cholesterol   . Restless leg syndrome     Past Surgical History:  Procedure Laterality Date  . ANGIOPLASTY  2010   2 stents placed  . BACK SURGERY    . COLONOSCOPY WITH PROPOFOL N/A 03/05/2018   Procedure: COLONOSCOPY WITH PROPOFOL;  Surgeon: Virgel Manifold, MD;  Location: [facility];  Service: Endoscopy;  Laterality: N/A;  . CORONARY ANGIOPLASTY    . CORONARY/GRAFT ACUTE MI REVASCULARIZATION N/A 09/30/2019   Procedure: Coronary/Graft Acute MI Revascularization;  Surgeon: Wellington Hampshire, MD;  Location: [place] [place] [facility];  Service: Cardiovascular;  Laterality: N/A;  . LEFT HEART CATH AND CORONARY ANGIOGRAPHY N/A 09/30/2019   Procedure: LEFT HEART CATH AND CORONARY ANGIOGRAPHY;  Surgeon: Wellington Hampshire, MD;  Location: [facility];  Service: Cardiovascular;  Laterality: N/A;    Family History  Problem Relation Age of Onset  . Heart disease Mother   . Heart disease Father     Social History   Socioeconomic History  . Marital status: Married    Spouse name: Not on file  . Number of children: Not on file  . Years of education: Not on file  . Highest education level: Not on file  Occupational History  . Not on file  Tobacco Use  . Smoking status: Former Smoker    Quit date: 03/05/2009    Years since quitting: 11.3  . Smokeless tobacco: Never Used  Vaping Use  . Vaping Use: Never used  Substance and  Sexual Activity  . Alcohol use: Yes    Comment: occassionally  . Drug use: Not Currently  . Sexual activity: Not on file  Other Topics Concern  . Not on file  Social History Narrative  . Not on file   Social Determinants of Health   Financial Resource Strain:   . Difficulty of Paying Living Expenses: Not on file  Food Insecurity:   . Worried About Charity fundraiser in the Last Year: Not on file  . Ran Out of Food in the Last Year: Not on file  Transportation Needs:   . Lack of Transportation (Medical): Not on file  . Lack of Transportation (Non-Medical): Not on file  Physical Activity:   . Days of Exercise per Week: Not on file  . Minutes of Exercise per Session: Not on file  Stress:   . Feeling of Stress : Not on file  Social Connections:   . Frequency of Communication with Friends and Family: Not on file  . Frequency of Social Gatherings with Friends and Family: Not on file  . Attends Religious Services: Not on file  . Active Member of Clubs or Organizations: Not on file  . Attends Archivist Meetings: Not on file  . Marital Status: Not on file  Intimate Partner Violence:   . Fear of Current  or Ex-Partner: Not on file  . Emotionally Abused: Not on file  . Physically Abused: Not on file  . Sexually Abused: Not on file     Current Outpatient Medications:  .  albuterol (VENTOLIN HFA) 108 (90 Base) MCG/ACT inhaler, Inhale 1-2 puffs into the lungs every 6 (six) hours as needed for wheezing or shortness of breath., Disp: , Rfl:  .  ANORO ELLIPTA 62.5-25 MCG/INH AEPB, Inhale 1 puff into the lungs daily., Disp: , Rfl:  .  Aspirin Buf,CaCarb-MgCarb-MgO, 81 MG TABS, Take 1 tablet by mouth daily., Disp: , Rfl:  .  atorvastatin (LIPITOR) 40 MG tablet, Take 40 mg by mouth daily., Disp: , Rfl: 8 .  carvedilol (COREG) 3.125 MG tablet, TAKE 1 TABLET (3.125 MG TOTAL) BY MOUTH 2 (TWO) TIMES DAILY WITH A MEAL., Disp: 60 tablet, Rfl: 1 .  cephALEXin (KEFLEX) 500 MG capsule,  Take 1 capsule (500 mg total) by mouth 4 (four) times daily., Disp: 40 capsule, Rfl: 0 .  chlorpheniramine-HYDROcodone (TUSSIONEX PENNKINETIC ER) 10-8 MG/5ML SUER, Take 5 mLs by mouth every 12 (twelve) hours as needed for cough., Disp: , Rfl:  .  fexofenadine (ALLEGRA) 180 MG tablet, Take 180 mg by mouth daily., Disp: , Rfl:  .  nitroGLYCERIN (NITROSTAT) 0.3 MG SL tablet, Place 1 tablet (0.3 mg total) under the tongue every 5 (five) minutes as needed for chest pain., Disp: 25 tablet, Rfl: 3 .  polyethylene glycol (MIRALAX / GLYCOLAX) packet, Take 17 g by mouth daily. , Disp: , Rfl:  .  sildenafil (REVATIO) 20 MG tablet, Take 1 tablet (20 mg total) by mouth as needed for up to 6 doses., Disp: 10 tablet, Rfl: 0 .  ticagrelor (BRILINTA) 90 MG TABS tablet, Take 1 tablet (90 mg total) by mouth 2 (two) times daily., Disp: 60 tablet, Rfl: 2   Allergies  Allergen Reactions  . Ace Inhibitors Swelling    Causes cough and swelling    ROS Review of Systems  Constitutional: Negative.  Negative for chills, fatigue and fever.  HENT: Negative.   Eyes: Negative.   Respiratory: Negative.   Cardiovascular: Negative.   Gastrointestinal: Negative.   Endocrine: Negative.   Genitourinary: Negative.   Musculoskeletal: Negative.   Skin: Positive for wound (healing well). Negative for rash.  Allergic/Immunologic: Negative.   Neurological: Negative.   Hematological: Negative.   Psychiatric/Behavioral: Negative.   All other systems reviewed and are negative.    OBJECTIVE:    Physical Exam Vitals reviewed.  Constitutional:      Appearance: Normal appearance.  HENT:     Mouth/Throat:     Mouth: Mucous membranes are moist.  Eyes:     Pupils: Pupils are equal, round, and reactive to light.  Neck:     Vascular: No carotid bruit.  Cardiovascular:     Rate and Rhythm: Normal rate and regular rhythm.     Pulses: Normal pulses.     Heart sounds: Normal heart sounds.  Pulmonary:     Effort: Pulmonary  effort is normal.     Breath sounds: Normal breath sounds.  Abdominal:     General: Bowel sounds are normal.     Palpations: Abdomen is soft. There is no hepatomegaly, splenomegaly or mass.     Tenderness: There is no abdominal tenderness.     Hernia: No hernia is present.  Musculoskeletal:     Cervical back: Neck supple.     Right lower leg: No edema.     Left lower leg:  No edema.  Skin:    Findings: Wound (healing well, covered by a pink smiley face band-aid) present. No rash.     Comments: Cellulitis is improved and drainage has decreased considerably. Pt has 3 days left of antibiotics.   Neurological:     Mental Status: He is alert and oriented to person, place, and time.     Motor: No weakness.  Psychiatric:        Mood and Affect: Mood normal.        Behavior: Behavior normal.     BP 116/75   Pulse 71   Wt 197 lb 8 oz (89.6 kg)   BMI 25.36 kg/m  Wt Readings from Last 3 Encounters:  06/28/20 197 lb 8 oz (89.6 kg)  06/22/20 195 lb 11.2 oz (88.8 kg)  06/18/20 198 lb (89.8 kg)    Health Maintenance Due  Topic Date Due  . Hepatitis C Screening  Never done  . COVID-19 Vaccine (1) Never done  . TETANUS/TDAP  Never done  . INFLUENZA VACCINE  Never done    There are no preventive care reminders to display for this patient.  CBC Latest Ref Rng & Units 06/12/2020 06/11/2020 06/10/2020  WBC 4.0 - 10.5 K/uL 11.1(H) 15.7(H) 13.2(H)  Hemoglobin 13.0 - 17.0 g/dL 13.9 13.9 14.2  Hematocrit 39 - 52 % 39.3 40.7 42.1  Platelets 150 - 400 K/uL 174 184 178   CMP Latest Ref Rng & Units 06/12/2020 06/11/2020 06/10/2020  Glucose 70 - 99 mg/dL 101(H) - 129(H)  BUN 8 - 23 mg/dL 15 - 15  Creatinine 0.61 - 1.24 mg/dL 0.68 0.80 0.95  Sodium 135 - 145 mmol/L 134(L) - 136  Potassium 3.5 - 5.1 mmol/L 4.3 - 4.0  Chloride 98 - 111 mmol/L 98 - 100  CO2 22 - 32 mmol/L 27 - 25  Calcium 8.9 - 10.3 mg/dL 8.7(L) - 8.4(L)  Total Protein 6.5 - 8.1 g/dL - - 8.2(H)  Total Bilirubin 0.3 - 1.2 mg/dL - -  1.2  Alkaline Phos 38 - 126 U/L - - 93  AST 15 - 41 U/L - - 25  ALT 0 - 44 U/L - - 27    No results found for: TSH Lab Results  Component Value Date   ALBUMIN 3.6 06/10/2020   ANIONGAP 9 06/12/2020   Lab Results  Component Value Date   CHOL 103 10/02/2019   HDL 33 (L) 10/02/2019   LDLCALC 64 10/02/2019   CHOLHDL 3.1 10/02/2019   Lab Results  Component Value Date   TRIG 30 10/02/2019   No results found for: HGBA1C    ASSESSMENT & PLAN:   Problem List Items Addressed This Visit      Respiratory   Pulmonary fibrosis ([place])    Pulmonary fibrosis is stable. Chest examination revealed no rales of rhonchi         Musculoskeletal and Integument   Disorder of left rotator cuff    Patient denies any pain in the left or right shoulder.         Other   Cellulitis of left buttock - Primary    Cellulitis of the left buttock is healing satisfactorily. He was advised to come back in one week          No orders of the defined types were placed in this encounter.   Follow-up: Return in about 3 weeks (around 07/19/2020).    [name], MD [facility] [address] [place], [place]  [address]   By signing my name below, I, [employer], attest that this documentation has been prepared under the direction and in the presence of Dr. [name] Electronically Signed: [name], MD 06/28/20, 5:12 PM  I personally performed the services described in this documentation, which was SCRIBED in my presence. The recorded information has been reviewed and considered accurate. It has been edited as necessary during review. [name], MD

## 2020-07-05 ENCOUNTER — Other Ambulatory Visit: Payer: Self-pay

## 2020-07-06 ENCOUNTER — Other Ambulatory Visit: Payer: Self-pay

## 2020-07-06 DIAGNOSIS — L03317 Cellulitis of buttock: Secondary | ICD-10-CM

## 2020-07-06 MED ORDER — CEPHALEXIN 500 MG PO CAPS: 500.0000 mg | ORAL_CAPSULE | Freq: Four times a day (QID) | ORAL | 0 refills | Status: AC

## 2020-07-09 ENCOUNTER — Other Ambulatory Visit: Payer: Self-pay

## 2020-07-09 ENCOUNTER — Ambulatory Visit (INDEPENDENT_AMBULATORY_CARE_PROVIDER_SITE_OTHER): Payer: 59 | Admitting: Internal Medicine

## 2020-07-09 ENCOUNTER — Encounter: Payer: Self-pay | Admitting: Internal Medicine

## 2020-07-09 VITALS — BP 154/74 | HR 82 | Ht 74.0 in | Wt 192.4 lb

## 2020-07-09 DIAGNOSIS — I2510 Atherosclerotic heart disease of native coronary artery without angina pectoris: Secondary | ICD-10-CM | POA: Diagnosis not present

## 2020-07-09 DIAGNOSIS — J8410 Pulmonary fibrosis, unspecified: Secondary | ICD-10-CM | POA: Diagnosis not present

## 2020-07-09 DIAGNOSIS — L03317 Cellulitis of buttock: Secondary | ICD-10-CM | POA: Diagnosis not present

## 2020-07-09 DIAGNOSIS — I2583 Coronary atherosclerosis due to lipid rich plaque: Secondary | ICD-10-CM

## 2020-07-09 DIAGNOSIS — M67912 Unspecified disorder of synovium and tendon, left shoulder: Secondary | ICD-10-CM | POA: Diagnosis not present

## 2020-07-09 MED ORDER — SULFAMETHOXAZOLE-TRIMETHOPRIM 800-160 MG PO TABS: 1.0000 | ORAL_TABLET | Freq: Two times a day (BID) | ORAL | 0 refills | Status: AC

## 2020-07-09 NOTE — Progress Notes (Signed)
Established Patient Office Visit  Subjective:  Patient ID: Dillon [name], male    DOB: July 20, 1956  Age: 64 y.o. MRN: [id number]  CC:  Chief Complaint  Patient presents with   Recurrent Skin Infections    cellulitis of left buttock, patient states that it is almost right over top of the previous sore. Patient restarted antbiotic saturday.     HPI  Dillon [name] presents for  Cellulitis patient came with a swelling of the left buttock.  He denies any history of fever chills there is no history of any injury.  There is no evidence of any bedsores.  He has a previous abscess in the same area before which is healed.  Past Medical History:  Diagnosis Date   Coronary artery disease    Elevated cholesterol    Restless leg syndrome     Past Surgical History:  Procedure Laterality Date   ANGIOPLASTY  2010   2 stents placed   BACK SURGERY     COLONOSCOPY WITH PROPOFOL N/A 03/05/2018   Procedure: COLONOSCOPY WITH PROPOFOL;  Surgeon: Virgel Manifold, MD;  Location: [facility];  Service: Endoscopy;  Laterality: N/A;   CORONARY ANGIOPLASTY     CORONARY/GRAFT ACUTE MI REVASCULARIZATION N/A 09/30/2019   Procedure: Coronary/Graft Acute MI Revascularization;  Surgeon: Wellington Hampshire, MD;  Location: [place] [facility];  Service: Cardiovascular;  Laterality: N/A;   LEFT HEART CATH AND CORONARY ANGIOGRAPHY N/A 09/30/2019   Procedure: LEFT HEART CATH AND CORONARY ANGIOGRAPHY;  Surgeon: Wellington Hampshire, MD;  Location: [place] [facility];  Service: Cardiovascular;  Laterality: N/A;    Family History  Problem Relation Age of Onset   Heart disease Mother    Heart disease Father     Social History   Socioeconomic History   Marital status: Married    Spouse name: Not on file   Number of children: Not on file   Years of education: Not on file   Highest education level: Not on file  Occupational History   Not on file  Tobacco Use   Smoking status: Former  Smoker    Quit date: 03/05/2009    Years since quitting: 11.3   Smokeless tobacco: Never Used  Vaping Use   Vaping Use: Never used  Substance and Sexual Activity   Alcohol use: Yes    Comment: occassionally   Drug use: Not Currently   Sexual activity: Not on file  Other Topics Concern   Not on file  Social History Narrative   Not on file   Social Determinants of Health   Financial Resource Strain:    Difficulty of Paying Living Expenses: Not on file  Food Insecurity:    Worried About [place] in the Last Year: Not on file   [place] in the Last Year: Not on file  Transportation Needs:    Lack of Transportation (Medical): Not on file   Lack of Transportation (Non-Medical): Not on file  Physical Activity:    Days of Exercise per Week: Not on file   Minutes of Exercise per Session: Not on file  Stress:    Feeling of Stress : Not on file  Social Connections:    Frequency of Communication with Friends and Family: Not on file   Frequency of Social Gatherings with Friends and Family: Not on file   Attends Religious Services: Not on file   Active Member of Clubs or Organizations: Not on file  Attends Archivist Meetings: Not on file   Marital Status: Not on file  Intimate Partner Violence:    Fear of Current or Ex-Partner: Not on file   Emotionally Abused: Not on file   Physically Abused: Not on file   Sexually Abused: Not on file     Current Outpatient Medications:    albuterol (VENTOLIN HFA) 108 (90 Base) MCG/ACT inhaler, Inhale 1-2 puffs into the lungs every 6 (six) hours as needed for wheezing or shortness of breath., Disp: , Rfl:    ANORO ELLIPTA 62.5-25 MCG/INH AEPB, Inhale 1 puff into the lungs daily., Disp: , Rfl:    Aspirin Buf,CaCarb-MgCarb-MgO, 81 MG TABS, Take 1 tablet by mouth daily., Disp: , Rfl:    atorvastatin (LIPITOR) 40 MG tablet, Take 40 mg by mouth daily., Disp: , Rfl: 8   carvedilol (COREG)  3.125 MG tablet, TAKE 1 TABLET (3.125 MG TOTAL) BY MOUTH 2 (TWO) TIMES DAILY WITH A MEAL., Disp: 60 tablet, Rfl: 1   cephALEXin (KEFLEX) 500 MG capsule, Take 1 capsule (500 mg total) by mouth 4 (four) times daily for 5 days., Disp: 20 capsule, Rfl: 0   chlorpheniramine-HYDROcodone (TUSSIONEX PENNKINETIC ER) 10-8 MG/5ML SUER, Take 5 mLs by mouth every 12 (twelve) hours as needed for cough., Disp: , Rfl:    fexofenadine (ALLEGRA) 180 MG tablet, Take 180 mg by mouth daily., Disp: , Rfl:    nitroGLYCERIN (NITROSTAT) 0.3 MG SL tablet, Place 1 tablet (0.3 mg total) under the tongue every 5 (five) minutes as needed for chest pain., Disp: 25 tablet, Rfl: 3   polyethylene glycol (MIRALAX / GLYCOLAX) packet, Take 17 g by mouth daily. , Disp: , Rfl:    sildenafil (REVATIO) 20 MG tablet, Take 1 tablet (20 mg total) by mouth as needed for up to 6 doses., Disp: 10 tablet, Rfl: 0   ticagrelor (BRILINTA) 90 MG TABS tablet, Take 1 tablet (90 mg total) by mouth 2 (two) times daily., Disp: 60 tablet, Rfl: 2   sulfamethoxazole-trimethoprim (BACTRIM DS) 800-160 MG tablet, Take 1 tablet by mouth 2 (two) times daily for 14 days., Disp: 28 tablet, Rfl: 0   Allergies  Allergen Reactions   Ace Inhibitors Swelling    Causes cough and swelling    ROS Review of Systems  Constitutional: Negative.   HENT: Negative.   Eyes: Negative.   Respiratory: Negative.   Cardiovascular: Negative.   Gastrointestinal: Negative.   Endocrine: Negative.   Genitourinary: Negative.   Musculoskeletal: Negative.   Skin: Negative.   Allergic/Immunologic: Negative.   Neurological: Negative.   Hematological: Negative.   Psychiatric/Behavioral: Negative.   All other systems reviewed and are negative.     Objective:    Physical Exam Vitals reviewed.  Constitutional:      Appearance: Normal appearance.  HENT:     Mouth/Throat:     Mouth: Mucous membranes are moist.  Eyes:     Pupils: Pupils are equal, round, and  reactive to light.  Neck:     Vascular: No carotid bruit.  Cardiovascular:     Rate and Rhythm: Normal rate and regular rhythm.     Pulses: Normal pulses.     Heart sounds: Normal heart sounds.  Pulmonary:     Effort: Pulmonary effort is normal.     Breath sounds: Normal breath sounds.  Abdominal:     General: Bowel sounds are normal.     Palpations: Abdomen is soft. There is no hepatomegaly, splenomegaly or mass.  Tenderness: There is no abdominal tenderness.     Hernia: No hernia is present.  Musculoskeletal:     Cervical back: Neck supple.     Right lower leg: No edema.     Left lower leg: No edema.  Skin:    Findings: No rash.          Comments: Patient has a swelling of the left buttock about the size of a nickel.  There is no evidence any spreading infection or lymphangitis.  Neurological:     Mental Status: He is alert and oriented to person, place, and time.     Motor: No weakness.  Psychiatric:        Mood and Affect: Mood normal.        Behavior: Behavior normal.     BP (!) 154/74    Pulse 82    Ht 6\' 2"  (1.88 m)    Wt 192 lb 6.4 oz (87.3 kg)    BMI 24.70 kg/m  Wt Readings from Last 3 Encounters:  07/09/20 192 lb 6.4 oz (87.3 kg)  06/28/20 197 lb 8 oz (89.6 kg)  06/22/20 195 lb 11.2 oz (88.8 kg)     Health Maintenance Due  Topic Date Due   Hepatitis C Screening  Never done   COVID-19 Vaccine (1) Never done   TETANUS/TDAP  Never done   INFLUENZA VACCINE  Never done    There are no preventive care reminders to display for this patient.  No results found for: TSH Lab Results  Component Value Date   WBC 11.1 (H) 06/12/2020   HGB 13.9 06/12/2020   HCT 39.3 06/12/2020   MCV 94.9 06/12/2020   PLT 174 06/12/2020   Lab Results  Component Value Date   NA 134 (L) 06/12/2020   K 4.3 06/12/2020   CO2 27 06/12/2020   GLUCOSE 101 (H) 06/12/2020   BUN 15 06/12/2020   CREATININE 0.68 06/12/2020   BILITOT 1.2 06/10/2020   ALKPHOS 93 06/10/2020    AST 25 06/10/2020   ALT 27 06/10/2020   PROT 8.2 (H) 06/10/2020   ALBUMIN 3.6 06/10/2020   CALCIUM 8.7 (L) 06/12/2020   ANIONGAP 9 06/12/2020   Lab Results  Component Value Date   CHOL 103 10/02/2019   Lab Results  Component Value Date   HDL 33 (L) 10/02/2019   Lab Results  Component Value Date   LDLCALC 64 10/02/2019   Lab Results  Component Value Date   TRIG 30 10/02/2019   Lab Results  Component Value Date   CHOLHDL 3.1 10/02/2019   No results found for: HGBA1C    Assessment & Plan:   Problem List Items Addressed This Visit      Cardiovascular and Mediastinum   Coronary artery disease due to lipid rich plaque    Patient denies any chest pain denies any arrhythmia denies any swelling of the legs there is no history of claudication.        Respiratory   Pulmonary fibrosis ([place])    Patient has  chronic pulmonary fibrosis  is off the oxygen  denies chest pain  denies any history of swelling of the hands or legs no pedal edema  denies any claudication.        Musculoskeletal and Integument   Disorder of left rotator cuff    Pain in the left rotator cuff is a chronic problem he was advised to continue to do home excercises exercises.        Other  Cellulitis of left buttock - Primary    Patient has swelling of the left buttock on the top of the previous abscess which has healed.  He was started on Bactrim DS 1 tablet twice a day we will see him back in 5 to 6 days for recheck      Relevant Medications   sulfamethoxazole-trimethoprim (BACTRIM DS) 800-160 MG tablet      Meds ordered this encounter  Medications   sulfamethoxazole-trimethoprim (BACTRIM DS) 800-160 MG tablet    Sig: Take 1 tablet by mouth 2 (two) times daily for 14 days.    Dispense:  28 tablet    Refill:  0    Follow-up: No follow-ups on file.    [name], MD

## 2020-07-09 NOTE — Assessment & Plan Note (Signed)
Pain in the left rotator cuff is a chronic problem he was advised to continue to do home excercises exercises.

## 2020-07-09 NOTE — Assessment & Plan Note (Signed)
Patient has swelling of the left buttock on the top of the previous abscess which has healed.  He was started on Bactrim DS 1 tablet twice a day we will see him back in 5 to 6 days for recheck

## 2020-07-09 NOTE — Assessment & Plan Note (Signed)
Patient denies any chest pain denies any arrhythmia denies any swelling of the legs there is no history of claudication.

## 2020-07-09 NOTE — Assessment & Plan Note (Signed)
Patient has  chronic pulmonary fibrosis  is off the oxygen  denies chest pain  denies any history of swelling of the hands or legs no pedal edema  denies any claudication.

## 2020-07-16 ENCOUNTER — Other Ambulatory Visit: Payer: Self-pay

## 2020-07-16 ENCOUNTER — Ambulatory Visit (INDEPENDENT_AMBULATORY_CARE_PROVIDER_SITE_OTHER): Payer: 59 | Admitting: Internal Medicine

## 2020-07-16 ENCOUNTER — Encounter: Payer: Self-pay | Admitting: Internal Medicine

## 2020-07-16 VITALS — BP 144/75 | HR 72 | Ht 74.0 in | Wt 190.5 lb

## 2020-07-16 DIAGNOSIS — M67912 Unspecified disorder of synovium and tendon, left shoulder: Secondary | ICD-10-CM

## 2020-07-16 DIAGNOSIS — J8410 Pulmonary fibrosis, unspecified: Secondary | ICD-10-CM | POA: Diagnosis not present

## 2020-07-16 DIAGNOSIS — F5221 Male erectile disorder: Secondary | ICD-10-CM

## 2020-07-16 DIAGNOSIS — L03317 Cellulitis of buttock: Secondary | ICD-10-CM

## 2020-07-16 NOTE — Assessment & Plan Note (Signed)
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  He is off the oxygen now but complains of shortness of breath if he bends down or walk too much.  Is being followed up by pulmonologist.

## 2020-07-16 NOTE — Assessment & Plan Note (Signed)
Cellulitis of the left buttock is getting better

## 2020-07-16 NOTE — Progress Notes (Signed)
Established Patient Office Visit  Subjective:  Patient ID: Dillon [name], male    DOB: 05/08/1956  Age: 64 y.o. MRN: [id number]  CC:  Chief Complaint  Patient presents with  . Cellulitis    Patient is here for a follow up on cellulitis on left buttock    HPI  Dillon [name] presents forcellulitis  Of lt buttock   patient has a cellulitis of the left buttock and is being treated initially with Keflex tomorrow with sulfa.  He is doing better the swelling is subsiding I told him to continue taking his antibiotics and see him back in 6 days  Past Medical History:  Diagnosis Date  . Coronary artery disease   . Elevated cholesterol   . Restless leg syndrome     Past Surgical History:  Procedure Laterality Date  . ANGIOPLASTY  2010   2 stents placed  . BACK SURGERY    . COLONOSCOPY WITH PROPOFOL N/A 03/05/2018   Procedure: COLONOSCOPY WITH PROPOFOL;  Surgeon: Virgel Manifold, MD;  Location: [facility];  Service: Endoscopy;  Laterality: N/A;  . CORONARY ANGIOPLASTY    . CORONARY/GRAFT ACUTE MI REVASCULARIZATION N/A 09/30/2019   Procedure: Coronary/Graft Acute MI Revascularization;  Surgeon: Wellington Hampshire, MD;  Location: [place] [facility];  Service: Cardiovascular;  Laterality: N/A;  . LEFT HEART CATH AND CORONARY ANGIOGRAPHY N/A 09/30/2019   Procedure: LEFT HEART CATH AND CORONARY ANGIOGRAPHY;  Surgeon: Wellington Hampshire, MD;  Location: [place] [facility];  Service: Cardiovascular;  Laterality: N/A;    Family History  Problem Relation Age of Onset  . Heart disease Mother   . Heart disease Father     Social History   Socioeconomic History  . Marital status: Married    Spouse name: Not on file  . Number of children: Not on file  . Years of education: Not on file  . Highest education level: Not on file  Occupational History  . Not on file  Tobacco Use  . Smoking status: Former Smoker    Quit date: 03/05/2009    Years since quitting: 11.3  . Smokeless  tobacco: Never Used  Vaping Use  . Vaping Use: Never used  Substance and Sexual Activity  . Alcohol use: Yes    Comment: occassionally  . Drug use: Not Currently  . Sexual activity: Not on file  Other Topics Concern  . Not on file  Social History Narrative  . Not on file   Social Determinants of Health   Financial Resource Strain:   . Difficulty of Paying Living Expenses: Not on file  Food Insecurity:   . Worried About Charity fundraiser in the Last Year: Not on file  . Ran Out of Food in the Last Year: Not on file  Transportation Needs:   . Lack of Transportation (Medical): Not on file  . Lack of Transportation (Non-Medical): Not on file  Physical Activity:   . Days of Exercise per Week: Not on file  . Minutes of Exercise per Session: Not on file  Stress:   . Feeling of Stress : Not on file  Social Connections:   . Frequency of Communication with Friends and Family: Not on file  . Frequency of Social Gatherings with Friends and Family: Not on file  . Attends Religious Services: Not on file  . Active Member of Clubs or Organizations: Not on file  . Attends Archivist Meetings: Not on file  . Marital Status:  Not on file  Intimate Partner Violence:   . Fear of Current or Ex-Partner: Not on file  . Emotionally Abused: Not on file  . Physically Abused: Not on file  . Sexually Abused: Not on file     Current Outpatient Medications:  .  albuterol (VENTOLIN HFA) 108 (90 Base) MCG/ACT inhaler, Inhale 1-2 puffs into the lungs every 6 (six) hours as needed for wheezing or shortness of breath., Disp: , Rfl:  .  ANORO ELLIPTA 62.5-25 MCG/INH AEPB, Inhale 1 puff into the lungs daily., Disp: , Rfl:  .  Aspirin Buf,CaCarb-MgCarb-MgO, 81 MG TABS, Take 1 tablet by mouth daily., Disp: , Rfl:  .  atorvastatin (LIPITOR) 40 MG tablet, Take 40 mg by mouth daily., Disp: , Rfl: 8 .  carvedilol (COREG) 3.125 MG tablet, TAKE 1 TABLET (3.125 MG TOTAL) BY MOUTH 2 (TWO) TIMES DAILY WITH  A MEAL., Disp: 60 tablet, Rfl: 1 .  chlorpheniramine-HYDROcodone (TUSSIONEX PENNKINETIC ER) 10-8 MG/5ML SUER, Take 5 mLs by mouth every 12 (twelve) hours as needed for cough., Disp: , Rfl:  .  fexofenadine (ALLEGRA) 180 MG tablet, Take 180 mg by mouth daily., Disp: , Rfl:  .  nitroGLYCERIN (NITROSTAT) 0.3 MG SL tablet, Place 1 tablet (0.3 mg total) under the tongue every 5 (five) minutes as needed for chest pain., Disp: 25 tablet, Rfl: 3 .  polyethylene glycol (MIRALAX / GLYCOLAX) packet, Take 17 g by mouth daily. , Disp: , Rfl:  .  sildenafil (REVATIO) 20 MG tablet, Take 1 tablet (20 mg total) by mouth as needed for up to 6 doses., Disp: 10 tablet, Rfl: 0 .  sulfamethoxazole-trimethoprim (BACTRIM DS) 800-160 MG tablet, Take 1 tablet by mouth 2 (two) times daily for 14 days., Disp: 28 tablet, Rfl: 0 .  ticagrelor (BRILINTA) 90 MG TABS tablet, Take 1 tablet (90 mg total) by mouth 2 (two) times daily., Disp: 60 tablet, Rfl: 2   Allergies  Allergen Reactions  . Ace Inhibitors Swelling    Causes cough and swelling    ROS Review of Systems  Constitutional: Negative.   HENT: Negative.   Eyes: Negative.   Respiratory: Negative.   Cardiovascular: Negative.   Gastrointestinal: Negative.   Endocrine: Negative.   Genitourinary: Negative.   Musculoskeletal: Negative.   Skin: Positive for wound.       There is a cellulitis of the left buttock which is healing good  Allergic/Immunologic: Negative.   Neurological: Negative.   Hematological: Negative.   Psychiatric/Behavioral: Negative.   All other systems reviewed and are negative.     Objective:    Physical Exam Vitals reviewed.  Constitutional:      Appearance: Normal appearance.  HENT:     Mouth/Throat:     Mouth: Mucous membranes are moist.  Eyes:     Pupils: Pupils are equal, round, and reactive to light.  Neck:     Vascular: No carotid bruit.  Cardiovascular:     Rate and Rhythm: Normal rate and regular rhythm.     Pulses:  Normal pulses.     Heart sounds: Normal heart sounds.  Pulmonary:     Effort: Pulmonary effort is normal.     Breath sounds: Normal breath sounds.  Abdominal:     General: Bowel sounds are normal.     Palpations: Abdomen is soft. There is no hepatomegaly, splenomegaly or mass.     Tenderness: There is no abdominal tenderness.     Hernia: No hernia is present.  Musculoskeletal:  Cervical back: Neck supple.     Right lower leg: No edema.     Left lower leg: No edema.     Comments: Left hip cellulitis is getting better  Skin:    Findings: No rash.  Neurological:     Mental Status: He is alert and oriented to person, place, and time.     Motor: No weakness.  Psychiatric:        Mood and Affect: Mood normal.        Behavior: Behavior normal.     BP (!) 144/75   Pulse 72   Ht 6\' 2"  (1.88 m)   Wt 190 lb 8 oz (86.4 kg)   BMI 24.46 kg/m  Wt Readings from Last 3 Encounters:  07/16/20 190 lb 8 oz (86.4 kg)  07/09/20 192 lb 6.4 oz (87.3 kg)  06/28/20 197 lb 8 oz (89.6 kg)     Health Maintenance Due  Topic Date Due  . Hepatitis C Screening  Never done  . COVID-19 Vaccine (1) Never done  . TETANUS/TDAP  Never done  . INFLUENZA VACCINE  Never done    There are no preventive care reminders to display for this patient.  No results found for: TSH Lab Results  Component Value Date   WBC 11.1 (H) 06/12/2020   HGB 13.9 06/12/2020   HCT 39.3 06/12/2020   MCV 94.9 06/12/2020   PLT 174 06/12/2020   Lab Results  Component Value Date   NA 134 (L) 06/12/2020   K 4.3 06/12/2020   CO2 27 06/12/2020   GLUCOSE 101 (H) 06/12/2020   BUN 15 06/12/2020   CREATININE 0.68 06/12/2020   BILITOT 1.2 06/10/2020   ALKPHOS 93 06/10/2020   AST 25 06/10/2020   ALT 27 06/10/2020   PROT 8.2 (H) 06/10/2020   ALBUMIN 3.6 06/10/2020   CALCIUM 8.7 (L) 06/12/2020   ANIONGAP 9 06/12/2020   Lab Results  Component Value Date   CHOL 103 10/02/2019   Lab Results  Component Value Date   HDL  33 (L) 10/02/2019   Lab Results  Component Value Date   LDLCALC 64 10/02/2019   Lab Results  Component Value Date   TRIG 30 10/02/2019   Lab Results  Component Value Date   CHOLHDL 3.1 10/02/2019   No results found for: HGBA1C    Assessment & Plan:   Problem List Items Addressed This Visit      Respiratory   Pulmonary fibrosis ([place])    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  He is off the oxygen now but complains of shortness of breath if he bends down or walk too much.  Is being followed up by pulmonologist.        Musculoskeletal and Integument   Disorder of left rotator cuff    Patient has a rotator cuff disorder both right and left shoulder left is bothering him more.  He has received a shot of cortisone in the left shoulder in past        Other   ED (erectile dysfunction) of non-organic origin    Patient has problem in the ED which is due to vasculogenic erectile dysfunction      Cellulitis of left buttock - Primary    Cellulitis of the left buttock is getting better         No orders of the defined types were placed in this encounter.   Follow-up: No follow-ups on file.    [name], MD

## 2020-07-16 NOTE — Assessment & Plan Note (Signed)
Patient has problem in the ED which is due to vasculogenic erectile dysfunction

## 2020-07-16 NOTE — Assessment & Plan Note (Signed)
Patient has a rotator cuff disorder both right and left shoulder left is bothering him more.  He has received a shot of cortisone in the left shoulder in past

## 2020-07-26 ENCOUNTER — Ambulatory Visit (INDEPENDENT_AMBULATORY_CARE_PROVIDER_SITE_OTHER): Payer: 59 | Admitting: Family Medicine

## 2020-07-26 ENCOUNTER — Other Ambulatory Visit: Payer: Self-pay

## 2020-07-26 ENCOUNTER — Encounter: Payer: Self-pay | Admitting: Family Medicine

## 2020-07-26 VITALS — BP 111/57 | HR 71 | Ht 74.0 in | Wt 197.7 lb

## 2020-07-26 DIAGNOSIS — L03317 Cellulitis of buttock: Secondary | ICD-10-CM | POA: Diagnosis not present

## 2020-07-26 DIAGNOSIS — I2510 Atherosclerotic heart disease of native coronary artery without angina pectoris: Secondary | ICD-10-CM

## 2020-07-26 DIAGNOSIS — I2583 Coronary atherosclerosis due to lipid rich plaque: Secondary | ICD-10-CM

## 2020-07-26 NOTE — Assessment & Plan Note (Signed)
Discussed Cardiac plan, he is evaluated by Cardiology twice yearly and has a physical scheduled her in 2 months. No recent CP or SOB

## 2020-07-26 NOTE — Progress Notes (Addendum)
Established Patient Office Visit  SUBJECTIVE:  Subjective  Patient ID: Dillon [name], male    DOB: [birth year]  Age: 64 y.o. MRN: [id number]  CC:  Chief Complaint  Patient presents with  . Follow-up    Patient is here today for a follow up of an abcess on his left buttock.     HPI Dillon [name] is a 64 y.o. male presenting today for a follow up of the buttocks abscess.  He is feeling well overall. He denies fevers. He denies worsening wound changes. The wound is healing well.   He denies recent chest pain or shortness of breath.   Past Medical History:  Diagnosis Date  . Coronary artery disease   . Elevated cholesterol   . Restless leg syndrome     Past Surgical History:  Procedure Laterality Date  . ANGIOPLASTY  2010   2 stents placed  . BACK SURGERY    . COLONOSCOPY WITH PROPOFOL N/A 03/05/2018   Procedure: COLONOSCOPY WITH PROPOFOL;  Surgeon: Virgel Manifold, MD;  Location: [facility];  Service: Endoscopy;  Laterality: N/A;  . CORONARY ANGIOPLASTY    . CORONARY/GRAFT ACUTE MI REVASCULARIZATION N/A 09/30/2019   Procedure: Coronary/Graft Acute MI Revascularization;  Surgeon: Wellington Hampshire, MD;  Location: [facility];  Service: Cardiovascular;  Laterality: N/A;  . LEFT HEART CATH AND CORONARY ANGIOGRAPHY N/A 09/30/2019   Procedure: LEFT HEART CATH AND CORONARY ANGIOGRAPHY;  Surgeon: Wellington Hampshire, MD;  Location: [facility];  Service: Cardiovascular;  Laterality: N/A;    Family History  Problem Relation Age of Onset  . Heart disease Mother   . Heart disease Father     Social History   Socioeconomic History  . Marital status: Married    Spouse name: Not on file  . Number of children: Not on file  . Years of education: Not on file  . Highest education level: Not on file  Occupational History  . Not on file  Tobacco Use  . Smoking status: Former Smoker    Quit date: 03/05/2009    Years since quitting: 11.4  . Smokeless  tobacco: Never Used  Vaping Use  . Vaping Use: Never used  Substance and Sexual Activity  . Alcohol use: Yes    Comment: occassionally  . Drug use: Not Currently  . Sexual activity: Not on file  Other Topics Concern  . Not on file  Social History Narrative  . Not on file   Social Determinants of Health   Financial Resource Strain:   . Difficulty of Paying Living Expenses: Not on file  Food Insecurity:   . Worried About Charity fundraiser in the Last Year: Not on file  . Ran Out of Food in the Last Year: Not on file  Transportation Needs:   . Lack of Transportation (Medical): Not on file  . Lack of Transportation (Non-Medical): Not on file  Physical Activity:   . Days of Exercise per Week: Not on file  . Minutes of Exercise per Session: Not on file  Stress:   . Feeling of Stress : Not on file  Social Connections:   . Frequency of Communication with Friends and Family: Not on file  . Frequency of Social Gatherings with Friends and Family: Not on file  . Attends Religious Services: Not on file  . Active Member of Clubs or Organizations: Not on file  . Attends Archivist Meetings: Not on file  . Marital  Status: Not on file  Intimate Partner Violence:   . Fear of Current or Ex-Partner: Not on file  . Emotionally Abused: Not on file  . Physically Abused: Not on file  . Sexually Abused: Not on file     Current Outpatient Medications:  .  albuterol (VENTOLIN HFA) 108 (90 Base) MCG/ACT inhaler, Inhale 1-2 puffs into the lungs every 6 (six) hours as needed for wheezing or shortness of breath., Disp: , Rfl:  .  ANORO ELLIPTA 62.5-25 MCG/INH AEPB, Inhale 1 puff into the lungs daily., Disp: , Rfl:  .  Aspirin Buf,CaCarb-MgCarb-MgO, 81 MG TABS, Take 1 tablet by mouth daily., Disp: , Rfl:  .  atorvastatin (LIPITOR) 40 MG tablet, Take 40 mg by mouth daily., Disp: , Rfl: 8 .  carvedilol (COREG) 3.125 MG tablet, TAKE 1 TABLET (3.125 MG TOTAL) BY MOUTH 2 (TWO) TIMES DAILY WITH  A MEAL., Disp: 60 tablet, Rfl: 1 .  chlorpheniramine-HYDROcodone (TUSSIONEX PENNKINETIC ER) 10-8 MG/5ML SUER, Take 5 mLs by mouth every 12 (twelve) hours as needed for cough., Disp: , Rfl:  .  fexofenadine (ALLEGRA) 180 MG tablet, Take 180 mg by mouth daily., Disp: , Rfl:  .  nitroGLYCERIN (NITROSTAT) 0.3 MG SL tablet, Place 1 tablet (0.3 mg total) under the tongue every 5 (five) minutes as needed for chest pain., Disp: 25 tablet, Rfl: 3 .  sildenafil (REVATIO) 20 MG tablet, Take 1 tablet (20 mg total) by mouth as needed for up to 6 doses., Disp: 10 tablet, Rfl: 0 .  ticagrelor (BRILINTA) 90 MG TABS tablet, Take 1 tablet (90 mg total) by mouth 2 (two) times daily., Disp: 60 tablet, Rfl: 2   Allergies  Allergen Reactions  . Ace Inhibitors Swelling    Causes cough and swelling    ROS Review of Systems  Constitutional: Negative.  Negative for fever.  HENT: Negative.   Eyes: Negative.   Respiratory: Negative.  Negative for shortness of breath.   Cardiovascular: Negative.  Negative for chest pain.  Gastrointestinal: Negative.   Endocrine: Negative.   Genitourinary: Negative.   Musculoskeletal: Negative.   Skin: Positive for wound (healing well). Negative for rash.  Allergic/Immunologic: Negative.   Neurological: Negative.   Hematological: Negative.   Psychiatric/Behavioral: Negative.   All other systems reviewed and are negative.    OBJECTIVE:    Physical Exam Vitals reviewed.  Constitutional:      Appearance: Normal appearance.  HENT:     Mouth/Throat:     Mouth: Mucous membranes are moist.  Eyes:     Pupils: Pupils are equal, round, and reactive to light.  Neck:     Vascular: No carotid bruit.  Cardiovascular:     Rate and Rhythm: Normal rate and regular rhythm.     Pulses: Normal pulses.     Heart sounds: Normal heart sounds.  Pulmonary:     Effort: Pulmonary effort is normal.     Breath sounds: Normal breath sounds.  Abdominal:     General: Bowel sounds are normal.      Palpations: Abdomen is soft. There is no hepatomegaly, splenomegaly or mass.     Tenderness: There is no abdominal tenderness.     Hernia: No hernia is present.  Musculoskeletal:     Cervical back: Neck supple.     Right lower leg: No edema.     Left lower leg: No edema.  Skin:    Findings: No rash.     Comments: The abscess on buttock has improved dramatically.  No sign of active infection.   Neurological:     Mental Status: He is alert and oriented to person, place, and time.     Motor: No weakness.  Psychiatric:        Mood and Affect: Mood normal.        Behavior: Behavior normal.     BP (!) 111/57   Pulse 71   Ht 6\' 2"  (1.88 m)   Wt 197 lb 11.2 oz (89.7 kg)   BMI 25.38 kg/m  Wt Readings from Last 3 Encounters:  07/26/20 197 lb 11.2 oz (89.7 kg)  07/16/20 190 lb 8 oz (86.4 kg)  07/09/20 192 lb 6.4 oz (87.3 kg)    Health Maintenance Due  Topic Date Due  . Hepatitis C Screening  Never done  . COVID-19 Vaccine (1) Never done  . TETANUS/TDAP  Never done  . INFLUENZA VACCINE  Never done    There are no preventive care reminders to display for this patient.  CBC Latest Ref Rng & Units 06/12/2020 06/11/2020 06/10/2020  WBC 4.0 - 10.5 K/uL 11.1(H) 15.7(H) 13.2(H)  Hemoglobin 13.0 - 17.0 g/dL 13.9 13.9 14.2  Hematocrit 39 - 52 % 39.3 40.7 42.1  Platelets 150 - 400 K/uL 174 184 178   CMP Latest Ref Rng & Units 06/12/2020 06/11/2020 06/10/2020  Glucose 70 - 99 mg/dL 101(H) - 129(H)  BUN 8 - 23 mg/dL 15 - 15  Creatinine 0.61 - 1.24 mg/dL 0.68 0.80 0.95  Sodium 135 - 145 mmol/L 134(L) - 136  Potassium 3.5 - 5.1 mmol/L 4.3 - 4.0  Chloride 98 - 111 mmol/L 98 - 100  CO2 22 - 32 mmol/L 27 - 25  Calcium 8.9 - 10.3 mg/dL 8.7(L) - 8.4(L)  Total Protein 6.5 - 8.1 g/dL - - 8.2(H)  Total Bilirubin 0.3 - 1.2 mg/dL - - 1.2  Alkaline Phos 38 - 126 U/L - - 93  AST 15 - 41 U/L - - 25  ALT 0 - 44 U/L - - 27    No results found for: TSH Lab Results  Component Value Date    ALBUMIN 3.6 06/10/2020   ANIONGAP 9 06/12/2020   Lab Results  Component Value Date   CHOL 103 10/02/2019   HDL 33 (L) 10/02/2019   LDLCALC 64 10/02/2019   CHOLHDL 3.1 10/02/2019   Lab Results  Component Value Date   TRIG 30 10/02/2019   No results found for: HGBA1C    ASSESSMENT & PLAN:   Problem List Items Addressed This Visit      Cardiovascular and Mediastinum   Coronary artery disease due to lipid rich plaque    Discussed Cardiac plan, he is evaluated by Cardiology twice yearly and has a physical scheduled her in 2 months. No recent CP or SOB        Other   Cellulitis of left buttock - Primary    Abscess area with no erythema or induration, wound opening completely healed.           No orders of the defined types were placed in this encounter.   Follow-up: No follow-ups on file.    Beckie Salts, [place] [address] [place], [place] [address]   By signing my name below, I, [employer], attest that this documentation has been prepared under the direction and in the presence of Beckie Salts, FNP Electronically Signed: Beckie Salts, [place] 07/26/20, 2:03 PM  I personally performed the services described in this  documentation, which was SCRIBED in my presence. The recorded information has been reviewed and considered accurate. It has been edited as necessary during review. Beckie Salts, FNP

## 2020-07-26 NOTE — Assessment & Plan Note (Signed)
Abscess area with no erythema or induration, wound opening completely healed.

## 2020-08-02 ENCOUNTER — Ambulatory Visit: Payer: 59 | Admitting: Cardiovascular Disease

## 2020-08-03 ENCOUNTER — Other Ambulatory Visit: Payer: Self-pay

## 2020-08-03 ENCOUNTER — Ambulatory Visit: Payer: 59 | Admitting: Cardiovascular Disease

## 2020-08-03 ENCOUNTER — Encounter: Payer: Self-pay | Admitting: Cardiovascular Disease

## 2020-08-03 VITALS — BP 130/80 | HR 65 | Ht 74.0 in | Wt 198.0 lb

## 2020-08-03 DIAGNOSIS — E785 Hyperlipidemia, unspecified: Secondary | ICD-10-CM | POA: Diagnosis not present

## 2020-08-03 DIAGNOSIS — I2583 Coronary atherosclerosis due to lipid rich plaque: Secondary | ICD-10-CM | POA: Diagnosis not present

## 2020-08-03 DIAGNOSIS — I252 Old myocardial infarction: Secondary | ICD-10-CM

## 2020-08-03 DIAGNOSIS — I10 Essential (primary) hypertension: Secondary | ICD-10-CM

## 2020-08-03 DIAGNOSIS — I2510 Atherosclerotic heart disease of native coronary artery without angina pectoris: Secondary | ICD-10-CM | POA: Diagnosis not present

## 2020-08-03 MED ORDER — CLOPIDOGREL BISULFATE 75 MG PO TABS: 75.0000 mg | ORAL_TABLET | Freq: Every day | ORAL | 1 refills | Status: DC

## 2020-08-03 NOTE — Addendum Note (Signed)
Addended by: Anselm Pancoast on: 08/03/2020 02:01 PM   Modules accepted: Orders

## 2020-08-03 NOTE — Progress Notes (Signed)
Cardiology Office Note   Date:  08/03/2020   ID:  Dillon [name], DOB Apr 17, 1956, MRN [id number]  PCP:  [name], MD  Cardiologist:   [name], MD   Chief Complaint  Patient presents with  . office visit    6 month F/U; Meds verbally reviewed with patient.      History of Present Illness: Dillon [name] is a 64 y.o. male who presents for follow-up visit regarding coronary artery disease. He has known history of coronary artery disease status post remote stenting of the right coronary artery, hyperlipidemia, pulmonary fibrosis and previous tobacco use. He presented in December 2020 with ST elevation myocardial infarction.  Cardiac catheterization showed occluded apical LAD supplying the distal inferior wall which was the culprit.  There was also 80% eccentric complex plaque rupture in the proximal LAD which was felt to be responsible for the distal embolization.  RCA stent was patent.  I performed successful PCI and drug-eluting stent placement to the proximal LAD.  I restored flow to the distal LAD just by wiring the occlusion site.  Ejection fraction was normal.  His myocardial infarction was in the setting of COVID-19 infection.  He has recovered very well.  He denies any chest pain, shortness of breath or palpitations.  He reports easy bruising with ticagrelor.  ACE inhibitor was discontinued due to cough.  Blood pressure is controlled with small dose carvedilol.   Past Medical History:  Diagnosis Date  . Coronary artery disease   . Elevated cholesterol   . Restless leg syndrome     Past Surgical History:  Procedure Laterality Date  . ANGIOPLASTY  2010   2 stents placed  . BACK SURGERY    . COLONOSCOPY WITH PROPOFOL N/A 03/05/2018   Procedure: COLONOSCOPY WITH PROPOFOL;  Surgeon: Virgel Manifold, MD;  Location: [facility];  Service: Endoscopy;  Laterality: N/A;  . CORONARY ANGIOPLASTY    . CORONARY/GRAFT ACUTE MI REVASCULARIZATION N/A 09/30/2019    Procedure: Coronary/Graft Acute MI Revascularization;  Surgeon: Wellington Hampshire, MD;  Location: [place] [facility];  Service: Cardiovascular;  Laterality: N/A;  . LEFT HEART CATH AND CORONARY ANGIOGRAPHY N/A 09/30/2019   Procedure: LEFT HEART CATH AND CORONARY ANGIOGRAPHY;  Surgeon: Wellington Hampshire, MD;  Location: [facility];  Service: Cardiovascular;  Laterality: N/A;     Current Outpatient Medications  Medication Sig Dispense Refill  . albuterol (VENTOLIN HFA) 108 (90 Base) MCG/ACT inhaler Inhale 1-2 puffs into the lungs every 6 (six) hours as needed for wheezing or shortness of breath.    Jearl Klinefelter ELLIPTA 62.5-25 MCG/INH AEPB Inhale 1 puff into the lungs daily.    . Aspirin Buf,CaCarb-MgCarb-MgO, 81 MG TABS Take 1 tablet by mouth daily.    Marland Kitchen atorvastatin (LIPITOR) 40 MG tablet Take 40 mg by mouth daily.  8  . carvedilol (COREG) 3.125 MG tablet TAKE 1 TABLET (3.125 MG TOTAL) BY MOUTH 2 (TWO) TIMES DAILY WITH A MEAL. 60 tablet 1  . chlorpheniramine-HYDROcodone (TUSSIONEX PENNKINETIC ER) 10-8 MG/5ML SUER Take 5 mLs by mouth every 12 (twelve) hours as needed for cough.    . fexofenadine (ALLEGRA) 180 MG tablet Take 180 mg by mouth daily.    . nitroGLYCERIN (NITROSTAT) 0.3 MG SL tablet Place 1 tablet (0.3 mg total) under the tongue every 5 (five) minutes as needed for chest pain. 25 tablet 3  . sildenafil (REVATIO) 20 MG tablet Take 1 tablet (20 mg total) by mouth as needed  for up to 6 doses. 10 tablet 0  . clopidogrel (PLAVIX) 75 MG tablet Take 1 tablet (75 mg total) by mouth daily. Take 2 tablets the first day then 1 tablet by mouth daily 90 tablet 1   No current facility-administered medications for this visit.    Allergies:   Ace inhibitors    Social History:  The patient  reports that he quit smoking about 11 years ago. He has never used smokeless tobacco. He reports current alcohol use. He reports previous drug use.   Family History:  The patient's family history  includes Heart disease in his father and mother.    ROS:  Please see the history of present illness.   Otherwise, review of systems are positive for none.   All other systems are reviewed and negative.    PHYSICAL EXAM: VS:  BP 130/80 (BP Location: Left Arm, Patient Position: Sitting, Cuff Size: Normal)   Pulse 65   Ht 6\' 2"  (1.88 m)   Wt 198 lb (89.8 kg)   SpO2 95%   BMI 25.42 kg/m  , BMI Body mass index is 25.42 kg/m. GEN: Well nourished, well developed, in no acute distress  HEENT: normal  Neck: no JVD, carotid bruits, or masses Cardiac: RRR; no murmurs, rubs, or gallops,no edema  Respiratory:  clear to auscultation bilaterally, normal work of breathing GI: soft, nontender, nondistended, + BS MS: no deformity or atrophy  Skin: warm and dry, no rash Neuro:  Strength and sensation are intact Psych: euthymic mood, full affect   EKG:  EKG is ordered today. The ekg ordered today demonstrates normal sinus rhythm with no significant ST or T wave changes.   Recent Labs: 06/10/2020: ALT 27 06/12/2020: BUN 15; Creatinine, Ser 0.68; Hemoglobin 13.9; Platelets 174; Potassium 4.3; Sodium 134    Lipid Panel    Component Value Date/Time   CHOL 103 10/02/2019 0331   TRIG 30 10/02/2019 0331   HDL 33 (L) 10/02/2019 0331   CHOLHDL 3.1 10/02/2019 0331   VLDL 6 10/02/2019 0331   LDLCALC 64 10/02/2019 0331      Wt Readings from Last 3 Encounters:  08/03/20 198 lb (89.8 kg)  07/26/20 197 lb 11.2 oz (89.7 kg)  07/16/20 190 lb 8 oz (86.4 kg)       No flowsheet data found.    ASSESSMENT AND PLAN:  1.  Coronary artery disease involving native coronary arteries without angina: He is doing very well at the present time with no anginal symptoms.  He is having significant bruising with ticagrelor and thus I elected to switch him to clopidogrel 75 mg once daily.  He tolerated this in the past very well.  Continue aspirin indefinitely.  I am planning to keep him on long-term dual  antiplatelet therapy given recurrent cardiac events.  2.  Hyperlipidemia: Continue treatment with atorvastatin.  Most recent lipid profile showed an LDL of 64.  3.  Essential hypertension: Blood pressures controlled on small dose carvedilol.  4.  Interstitial lung disease: Stable dyspnea.    Disposition:   FU with me in 6 months  Signed,  [name], MD  08/03/2020 10:40 AM    [place]

## 2020-08-03 NOTE — Patient Instructions (Addendum)
Medication Instructions:  Stop Brilinta  Start Plavix 75 mg take one tablet by mouth daily.  Take 2 tablets of Plavix on the first day then 1 tablet by mouth daily.    *If you need a refill on your cardiac medications before your next appointment, please call your pharmacy*   Lab Work: NONE   If you have labs (blood work) drawn today and your tests are completely normal, you will receive your results only by: Marland Kitchen MyChart Message (if you have MyChart) OR . A paper copy in the mail If you have any lab test that is abnormal or we need to change your treatment, we will call you to review the results.   Testing/Procedures: NONE     Follow-Up: At [facility], you and your health needs are our priority.  As part of our continuing mission to provide you with exceptional heart care, we have created designated Provider Care Teams.  These Care Teams include your primary Cardiologist (physician) and Advanced Practice Providers (APPs -  Physician Assistants and Nurse Practitioners) who all work together to provide you with the care you need, when you need it.  We recommend signing up for the patient portal called "MyChart".  Sign up information is provided on this After Visit Summary.  MyChart is used to connect with patients for Virtual Visits (Telemedicine).  Patients are able to view lab/test results, encounter notes, upcoming appointments, etc.  Non-urgent messages can be sent to your provider as well.   To learn more about what you can do with MyChart, go to [email or web address].    Your next appointment:   6 month(s)  The format for your next appointment:   In Person  Provider:   You may see [name], MD or one of the following Advanced Practice Providers on your designated Care Team:    Murray Hodgkins, NP  Christell Faith, PA-C  Marrianne Mood, PA-C  Cadence McGehee, [place]

## 2020-08-12 ENCOUNTER — Other Ambulatory Visit: Payer: Self-pay | Admitting: Family

## 2020-08-12 DIAGNOSIS — I2510 Atherosclerotic heart disease of native coronary artery without angina pectoris: Secondary | ICD-10-CM

## 2020-08-31 ENCOUNTER — Ambulatory Visit (INDEPENDENT_AMBULATORY_CARE_PROVIDER_SITE_OTHER): Payer: 59 | Admitting: Family Medicine

## 2020-08-31 ENCOUNTER — Other Ambulatory Visit: Payer: Self-pay

## 2020-08-31 DIAGNOSIS — L03317 Cellulitis of buttock: Secondary | ICD-10-CM

## 2020-08-31 DIAGNOSIS — F5221 Male erectile disorder: Secondary | ICD-10-CM | POA: Diagnosis not present

## 2020-08-31 DIAGNOSIS — I10 Essential (primary) hypertension: Secondary | ICD-10-CM

## 2020-08-31 DIAGNOSIS — E785 Hyperlipidemia, unspecified: Secondary | ICD-10-CM

## 2020-08-31 DIAGNOSIS — J8410 Pulmonary fibrosis, unspecified: Secondary | ICD-10-CM

## 2020-09-01 LAB — LIPID PANEL
Cholesterol: 121 mg/dL (ref <200)
HDL: 41 mg/dL (ref >=40)
LDL Cholesterol (Calc): 63 mg/dL (calc)
Non-HDL Cholesterol (Calc): 80 mg/dL (calc) (ref <130)
Total CHOL/HDL Ratio: 3 (calc) (ref <5.0)
Triglycerides: 90 mg/dL (ref <150)

## 2020-09-01 LAB — COMPLETE METABOLIC PANEL WITH GFR
AG Ratio: 1 (calc) (ref 1.0–2.5)
ALT: 18 U/L (ref 9–46)
AST: 21 U/L (ref 10–35)
Albumin: 4.1 g/dL (ref 3.6–5.1)
Alkaline phosphatase (APISO): 100 U/L (ref 35–144)
BUN: 12 mg/dL (ref 7–25)
CO2: 18 mmol/L — ABNORMAL LOW (ref 20–32)
Calcium: 9.5 mg/dL (ref 8.6–10.3)
Chloride: 107 mmol/L (ref 98–110)
Creat: 0.77 mg/dL (ref 0.70–1.25)
GFR, Est African American: 111 mL/min/{1.73_m2} (ref >=60)
GFR, Est Non African American: 96 mL/min/{1.73_m2} (ref >=60)
Globulin: 4.1 g/dL (calc) — ABNORMAL HIGH (ref 1.9–3.7)
Glucose, Bld: 68 mg/dL (ref 65–99)
Potassium: 4.4 mmol/L (ref 3.5–5.3)
Sodium: 140 mmol/L (ref 135–146)
Total Bilirubin: 0.8 mg/dL (ref 0.2–1.2)
Total Protein: 8.2 g/dL — ABNORMAL HIGH (ref 6.1–8.1)

## 2020-09-01 LAB — CBC WITH DIFFERENTIAL/PLATELET
Absolute Monocytes: 990 cells/uL — ABNORMAL HIGH (ref 200–950)
Basophils Absolute: 69 cells/uL (ref 0–200)
Basophils Relative: 0.7 %
Eosinophils Absolute: 257 cells/uL (ref 15–500)
Eosinophils Relative: 2.6 %
HCT: 43.2 % (ref 38.5–50.0)
Hemoglobin: 15 g/dL (ref 13.2–17.1)
Lymphs Abs: 1346 cells/uL (ref 850–3900)
MCH: 33 pg (ref 27.0–33.0)
MCHC: 34.7 g/dL (ref 32.0–36.0)
MCV: 95.2 fL (ref 80.0–100.0)
MPV: 8.6 fL (ref 7.5–12.5)
Monocytes Relative: 10 %
Neutro Abs: 7237 cells/uL (ref 1500–7800)
Neutrophils Relative %: 73.1 %
Platelets: 202 10*3/uL (ref 140–400)
RBC: 4.54 10*6/uL (ref 4.20–5.80)
RDW: 12.9 % (ref 11.0–15.0)
Total Lymphocyte: 13.6 %
WBC: 9.9 10*3/uL (ref 3.8–10.8)

## 2020-09-01 LAB — PSA: PSA: 0.42 ng/mL (ref <=4.0)

## 2020-09-01 LAB — TSH: TSH: 1.34 mIU/L (ref 0.40–4.50)

## 2020-09-07 ENCOUNTER — Encounter: Payer: Self-pay | Admitting: Family Medicine

## 2020-09-07 ENCOUNTER — Ambulatory Visit (INDEPENDENT_AMBULATORY_CARE_PROVIDER_SITE_OTHER): Payer: 59 | Admitting: Family Medicine

## 2020-09-07 ENCOUNTER — Other Ambulatory Visit: Payer: Self-pay

## 2020-09-07 VITALS — BP 138/84 | HR 88 | Temp 101.2°F | Ht 74.0 in | Wt 198.0 lb

## 2020-09-07 DIAGNOSIS — Z0000 Encounter for general adult medical examination without abnormal findings: Secondary | ICD-10-CM

## 2020-09-07 DIAGNOSIS — E785 Hyperlipidemia, unspecified: Secondary | ICD-10-CM | POA: Insufficient documentation

## 2020-09-07 DIAGNOSIS — R509 Fever, unspecified: Secondary | ICD-10-CM | POA: Diagnosis not present

## 2020-09-07 NOTE — Assessment & Plan Note (Signed)
Patient had the Covid 19 booster yesterday, fever started last night around 9 pm. He was ill after his second vaccine as well, self limiting, pt to take Tylenol and Ibuprofen as needed.

## 2020-09-07 NOTE — Assessment & Plan Note (Addendum)
Colonoscopy- 2019 TDAP- Will have pt come back in to get TDAP on a non fever day. PSA- 2021 Normal Eye Exam- 2 years ago.  CT Chest due to hx of Smoking- Done 11/2019

## 2020-09-07 NOTE — Assessment & Plan Note (Signed)
Lipid panel wnl, all meds tolerated well.

## 2020-09-07 NOTE — Progress Notes (Signed)
Established Patient Office Visit  SUBJECTIVE:  Subjective  Patient ID: Dillon [name], male    DOB: [birth year]  Age: 64 y.o. MRN: [id number]  CC: No chief complaint on file.   HPI [name] is a 64 y.o. male presenting today for     Past Medical History:  Diagnosis Date   Coronary artery disease    Elevated cholesterol    Restless leg syndrome     Past Surgical History:  Procedure Laterality Date   ANGIOPLASTY  2010   2 stents placed   BACK SURGERY     COLONOSCOPY WITH PROPOFOL N/A 03/05/2018   Procedure: COLONOSCOPY WITH PROPOFOL;  Surgeon: Virgel Manifold, MD;  Location: [facility];  Service: Endoscopy;  Laterality: N/A;   CORONARY ANGIOPLASTY     CORONARY/GRAFT ACUTE MI REVASCULARIZATION N/A 09/30/2019   Procedure: Coronary/Graft Acute MI Revascularization;  Surgeon: Wellington Hampshire, MD;  Location: [place] [facility];  Service: Cardiovascular;  Laterality: N/A;   LEFT HEART CATH AND CORONARY ANGIOGRAPHY N/A 09/30/2019   Procedure: LEFT HEART CATH AND CORONARY ANGIOGRAPHY;  Surgeon: Wellington Hampshire, MD;  Location: [place] [facility];  Service: Cardiovascular;  Laterality: N/A;    Family History  Problem Relation Age of Onset   Heart disease Mother    Heart disease Father     Social History   Socioeconomic History   Marital status: Married    Spouse name: Not on file   Number of children: Not on file   Years of education: Not on file   Highest education level: Not on file  Occupational History   Not on file  Tobacco Use   Smoking status: Former Smoker    Quit date: 03/05/2009    Years since quitting: 11.5   Smokeless tobacco: Never Used  Vaping Use   Vaping Use: Never used  Substance and Sexual Activity   Alcohol use: Yes    Comment: occassionally   Drug use: Not Currently   Sexual activity: Not on file  Other Topics Concern   Not on file  Social History Narrative   Not on file   Social Determinants of  Health   Financial Resource Strain:    Difficulty of Paying Living Expenses: Not on file  Food Insecurity:    Worried About Charity fundraiser in the Last Year: Not on file   [place] in the Last Year: Not on file  Transportation Needs:    Lack of Transportation (Medical): Not on file   Lack of Transportation (Non-Medical): Not on file  Physical Activity:    Days of Exercise per Week: Not on file   Minutes of Exercise per Session: Not on file  Stress:    Feeling of Stress : Not on file  Social Connections:    Frequency of Communication with Friends and Family: Not on file   Frequency of Social Gatherings with Friends and Family: Not on file   Attends Religious Services: Not on file   Active Member of Clubs or Organizations: Not on file   Attends Archivist Meetings: Not on file   Marital Status: Not on file  Intimate Partner Violence:    Fear of Current or Ex-Partner: Not on file   Emotionally Abused: Not on file   Physically Abused: Not on file   Sexually Abused: Not on file     Current Outpatient Medications:    albuterol (VENTOLIN HFA) 108 (90 Base) MCG/ACT inhaler, Inhale 1-2  puffs into the lungs every 6 (six) hours as needed for wheezing or shortness of breath., Disp: , Rfl:    ANORO ELLIPTA 62.5-25 MCG/INH AEPB, Inhale 1 puff into the lungs daily., Disp: , Rfl:    Aspirin Buf,CaCarb-MgCarb-MgO, 81 MG TABS, Take 1 tablet by mouth daily., Disp: , Rfl:    atorvastatin (LIPITOR) 40 MG tablet, Take 40 mg by mouth daily., Disp: , Rfl: 8   carvedilol (COREG) 3.125 MG tablet, TAKE 1 TABLET (3.125 MG TOTAL) BY MOUTH 2 (TWO) TIMES DAILY WITH A MEAL., Disp: 60 tablet, Rfl: 5   chlorpheniramine-HYDROcodone (TUSSIONEX PENNKINETIC ER) 10-8 MG/5ML SUER, Take 5 mLs by mouth every 12 (twelve) hours as needed for cough., Disp: , Rfl:    clopidogrel (PLAVIX) 75 MG tablet, Take 1 tablet (75 mg total) by mouth daily. Take 2 tablets the first day then 1  tablet by mouth daily, Disp: 90 tablet, Rfl: 1   fexofenadine (ALLEGRA) 180 MG tablet, Take 180 mg by mouth daily., Disp: , Rfl:    nitroGLYCERIN (NITROSTAT) 0.3 MG SL tablet, Place 1 tablet (0.3 mg total) under the tongue every 5 (five) minutes as needed for chest pain., Disp: 25 tablet, Rfl: 3   sildenafil (REVATIO) 20 MG tablet, Take 1 tablet (20 mg total) by mouth as needed for up to 6 doses., Disp: 10 tablet, Rfl: 0   Allergies  Allergen Reactions   Ace Inhibitors Swelling    Causes cough and swelling    ROS Review of Systems  Constitutional: Positive for chills and fever.  HENT: Positive for ear pain.   Cardiovascular: Negative.   Gastrointestinal: Negative.   Genitourinary: Negative.   Musculoskeletal: Negative.   Neurological: Negative.   Hematological: Negative.      OBJECTIVE:    Physical Exam  BP 138/84    Pulse 88    Temp (!) 101.2 F (38.4 C)    Ht 6\' 2"  (1.88 m)    Wt 198 lb (89.8 kg)    BMI 25.42 kg/m  Wt Readings from Last 3 Encounters:  09/07/20 198 lb (89.8 kg)  08/03/20 198 lb (89.8 kg)  07/26/20 197 lb 11.2 oz (89.7 kg)    Health Maintenance Due  Topic Date Due   Hepatitis C Screening  Never done   COVID-19 Vaccine (1) Never done   TETANUS/TDAP  Never done   INFLUENZA VACCINE  Never done    There are no preventive care reminders to display for this patient.  CBC Latest Ref Rng & Units 08/31/2020 06/12/2020 06/11/2020  WBC 3.8 - 10.8 Thousand/uL 9.9 11.1(H) 15.7(H)  Hemoglobin 13.2 - 17.1 g/dL 15.0 13.9 13.9  Hematocrit 38 - 50 % 43.2 39.3 40.7  Platelets 140 - 400 Thousand/uL 202 174 184   CMP Latest Ref Rng & Units 08/31/2020 06/12/2020 06/11/2020  Glucose 65 - 99 mg/dL 68 101(H) -  BUN 7 - 25 mg/dL 12 15 -  Creatinine 0.70 - 1.25 mg/dL 0.77 0.68 0.80  Sodium 135 - 146 mmol/L 140 134(L) -  Potassium 3.5 - 5.3 mmol/L 4.4 4.3 -  Chloride 98 - 110 mmol/L 107 98 -  CO2 20 - 32 mmol/L 18(L) 27 -  Calcium 8.6 - 10.3 mg/dL 9.5 8.7(L) -    Total Protein 6.1 - 8.1 g/dL 8.2(H) - -  Total Bilirubin 0.2 - 1.2 mg/dL 0.8 - -  Alkaline Phos 38 - 126 U/L - - -  AST 10 - 35 U/L 21 - -  ALT 9 - 46  U/L 18 - -    Lab Results  Component Value Date   TSH 1.34 08/31/2020   Lab Results  Component Value Date   ALBUMIN 3.6 06/10/2020   ANIONGAP 9 06/12/2020   Lab Results  Component Value Date   CHOL 121 08/31/2020   CHOL 103 10/02/2019   HDL 41 08/31/2020   HDL 33 (L) 10/02/2019   LDLCALC 63 08/31/2020   LDLCALC 64 10/02/2019   CHOLHDL 3.0 08/31/2020   CHOLHDL 3.1 10/02/2019   Lab Results  Component Value Date   TRIG 90 08/31/2020   No results found for: HGBA1C    ASSESSMENT & PLAN:   Problem List Items Addressed This Visit      Other   Annual physical exam - Primary    Colonoscopy- 2019 TDAP- Will have pt come back in to get TDAP on a non fever day. PSA- 2021 Normal Eye Exam- 2 years ago.       Fever in adult    Patient had the Covid 19 booster yesterday, fever started last night around 9 pm. He was ill after his second vaccine as well, self limiting, pt to take Tylenol and Ibuprofen as needed.         No orders of the defined types were placed in this encounter.     Follow-up: No follow-ups on file.    Beckie Salts, [place] [address] [place], [place] [address]

## 2020-09-10 ENCOUNTER — Other Ambulatory Visit: Payer: Self-pay | Admitting: *Deleted

## 2020-09-10 MED ORDER — SULFAMETHOXAZOLE-TRIMETHOPRIM 800-160 MG PO TABS: 1.0000 | ORAL_TABLET | Freq: Two times a day (BID) | ORAL | 0 refills | Status: DC

## 2020-09-21 ENCOUNTER — Other Ambulatory Visit: Payer: Self-pay | Admitting: *Deleted

## 2020-09-21 MED ORDER — ATORVASTATIN CALCIUM 40 MG PO TABS
40.0000 mg | ORAL_TABLET | Freq: Every day | ORAL | 8 refills | Status: DC
Start: 2020-09-21 — End: 2021-06-13

## 2020-10-02 ENCOUNTER — Other Ambulatory Visit: Payer: Self-pay | Admitting: Internal Medicine

## 2020-10-04 ENCOUNTER — Other Ambulatory Visit: Payer: Self-pay | Admitting: *Deleted

## 2020-10-04 MED ORDER — SULFAMETHOXAZOLE-TRIMETHOPRIM 800-160 MG PO TABS
1.0000 | ORAL_TABLET | Freq: Two times a day (BID) | ORAL | 0 refills | Status: DC
Start: 2020-10-04 — End: 2020-12-21

## 2020-11-14 ENCOUNTER — Other Ambulatory Visit: Payer: Self-pay | Admitting: *Deleted

## 2020-11-14 MED ORDER — COMBIVENT RESPIMAT 20-100 MCG/ACT IN AERS: 1.0000 | INHALATION_SPRAY | Freq: Four times a day (QID) | RESPIRATORY_TRACT | 6 refills | Status: DC

## 2020-12-07 ENCOUNTER — Encounter: Payer: Self-pay | Admitting: Family Medicine

## 2020-12-07 ENCOUNTER — Ambulatory Visit: Payer: 59 | Admitting: Family Medicine

## 2020-12-07 ENCOUNTER — Other Ambulatory Visit: Payer: Self-pay

## 2020-12-07 VITALS — BP 127/76 | HR 72 | Ht 74.0 in | Wt 195.0 lb

## 2020-12-07 DIAGNOSIS — S40812A Abrasion of left upper arm, initial encounter: Secondary | ICD-10-CM | POA: Insufficient documentation

## 2020-12-07 DIAGNOSIS — Z23 Encounter for immunization: Secondary | ICD-10-CM | POA: Diagnosis not present

## 2020-12-07 DIAGNOSIS — I10 Essential (primary) hypertension: Secondary | ICD-10-CM | POA: Diagnosis not present

## 2020-12-07 DIAGNOSIS — J449 Chronic obstructive pulmonary disease, unspecified: Secondary | ICD-10-CM | POA: Diagnosis not present

## 2020-12-07 MED ORDER — BUDESONIDE-FORMOTEROL FUMARATE 160-4.5 MCG/ACT IN AERO: 2.0000 | INHALATION_SPRAY | Freq: Two times a day (BID) | RESPIRATORY_TRACT | 3 refills | Status: DC

## 2020-12-07 NOTE — Assessment & Plan Note (Addendum)
HTN wnl, taking all meds as rx. Plan- Continue meds.

## 2020-12-07 NOTE — Assessment & Plan Note (Signed)
Patient with self limiting abrasion from mechanic work. Plan- Clean dressing and TDAP

## 2020-12-07 NOTE — Progress Notes (Signed)
Established Patient Office Visit  SUBJECTIVE:  Subjective  Patient ID: Dillon [name], male    DOB: 1956-06-19  Age: 65 y.o. MRN: [id number]  CC:  Chief Complaint  Patient presents with  . 3 month follow up    HPI Dillon [name] is a 65 y.o. male presenting today for     Past Medical History:  Diagnosis Date  . Coronary artery disease   . Elevated cholesterol   . Restless leg syndrome     Past Surgical History:  Procedure Laterality Date  . ANGIOPLASTY  2010   2 stents placed  . BACK SURGERY    . COLONOSCOPY WITH PROPOFOL N/A 03/05/2018   Procedure: COLONOSCOPY WITH PROPOFOL;  Surgeon: Virgel Manifold, MD;  Location: [facility];  Service: Endoscopy;  Laterality: N/A;  . CORONARY ANGIOPLASTY    . CORONARY/GRAFT ACUTE MI REVASCULARIZATION N/A 09/30/2019   Procedure: Coronary/Graft Acute MI Revascularization;  Surgeon: Wellington Hampshire, MD;  Location: [place] [facility];  Service: Cardiovascular;  Laterality: N/A;  . LEFT HEART CATH AND CORONARY ANGIOGRAPHY N/A 09/30/2019   Procedure: LEFT HEART CATH AND CORONARY ANGIOGRAPHY;  Surgeon: Wellington Hampshire, MD;  Location: [place] [facility];  Service: Cardiovascular;  Laterality: N/A;    Family History  Problem Relation Age of Onset  . Heart disease Mother   . Heart disease Father     Social History   Socioeconomic History  . Marital status: Married    Spouse name: Not on file  . Number of children: Not on file  . Years of education: Not on file  . Highest education level: Not on file  Occupational History  . Not on file  Tobacco Use  . Smoking status: Former Smoker    Quit date: 03/05/2009    Years since quitting: 11.7  . Smokeless tobacco: Never Used  Vaping Use  . Vaping Use: Never used  Substance and Sexual Activity  . Alcohol use: Yes    Comment: occassionally  . Drug use: Not Currently  . Sexual activity: Not on file  Other Topics Concern  . Not on file  Social History Narrative  . Not  on file   Social Determinants of Health   Financial Resource Strain: Not on file  Food Insecurity: Not on file  Transportation Needs: Not on file  Physical Activity: Not on file  Stress: Not on file  Social Connections: Not on file  Intimate Partner Violence: Not on file     Current Outpatient Medications:  .  albuterol (VENTOLIN HFA) 108 (90 Base) MCG/ACT inhaler, Inhale 1-2 puffs into the lungs every 6 (six) hours as needed for wheezing or shortness of breath., Disp: , Rfl:  .  Aspirin Buf,CaCarb-MgCarb-MgO, 81 MG TABS, Take 1 tablet by mouth daily., Disp: , Rfl:  .  atorvastatin (LIPITOR) 40 MG tablet, Take 1 tablet (40 mg total) by mouth daily., Disp: 30 tablet, Rfl: 8 .  budesonide-formoterol (SYMBICORT) 160-4.5 MCG/ACT inhaler, Inhale 2 puffs into the lungs 2 (two) times daily., Disp: 1 each, Rfl: 3 .  carvedilol (COREG) 3.125 MG tablet, TAKE 1 TABLET (3.125 MG TOTAL) BY MOUTH 2 (TWO) TIMES DAILY WITH A MEAL., Disp: 60 tablet, Rfl: 5 .  chlorpheniramine-HYDROcodone (TUSSIONEX) 10-8 MG/5ML SUER, Take 5 mLs by mouth every 12 (twelve) hours as needed for cough., Disp: , Rfl:  .  clopidogrel (PLAVIX) 75 MG tablet, Take 1 tablet (75 mg total) by mouth daily. Take 2 tablets the first day then  1 tablet by mouth daily, Disp: 90 tablet, Rfl: 1 .  fexofenadine (ALLEGRA) 180 MG tablet, Take 180 mg by mouth daily., Disp: , Rfl:  .  Ipratropium-Albuterol (COMBIVENT RESPIMAT) 20-100 MCG/ACT AERS respimat, Inhale 1 puff into the lungs every 6 (six) hours., Disp: 1 each, Rfl: 6 .  nitroGLYCERIN (NITROSTAT) 0.3 MG SL tablet, Place 1 tablet (0.3 mg total) under the tongue every 5 (five) minutes as needed for chest pain., Disp: 25 tablet, Rfl: 3 .  Pirfenidone (ESBRIET) 267 MG CAPS, Take by mouth., Disp: , Rfl:  .  sildenafil (REVATIO) 20 MG tablet, Take 1 tablet (20 mg total) by mouth as needed for up to 6 doses., Disp: 10 tablet, Rfl: 0 .  sulfamethoxazole-trimethoprim (BACTRIM DS) 800-160 MG tablet,  Take 1 tablet by mouth 2 (two) times daily., Disp: 20 tablet, Rfl: 0   Allergies  Allergen Reactions  . Ace Inhibitors Swelling    Causes cough and swelling    ROS Review of Systems  Constitutional: Negative.   HENT: Negative.   Respiratory: Positive for shortness of breath and wheezing.   Gastrointestinal: Negative.   Genitourinary: Negative.   Musculoskeletal: Negative.   Psychiatric/Behavioral: Negative.      OBJECTIVE:    Physical Exam Vitals and nursing note reviewed.  Constitutional:      Appearance: Normal appearance.  HENT:     Head: Normocephalic.     Mouth/Throat:     Mouth: Mucous membranes are dry.  Eyes:     Pupils: Pupils are equal, round, and reactive to light.  Cardiovascular:     Rate and Rhythm: Normal rate and regular rhythm.  Pulmonary:     Breath sounds: Wheezing present.  Musculoskeletal:        General: Normal range of motion.  Skin:    Capillary Refill: Capillary refill takes less than 2 seconds.  Neurological:     Mental Status: He is alert.     BP 127/76   Pulse 72   Ht 6\' 2"  (1.88 m)   Wt 195 lb (88.5 kg)   BMI 25.04 kg/m  Wt Readings from Last 3 Encounters:  12/07/20 195 lb (88.5 kg)  09/07/20 198 lb (89.8 kg)  08/03/20 198 lb (89.8 kg)    Health Maintenance Due  Topic Date Due  . Hepatitis C Screening  Never done  . COVID-19 Vaccine (1) Never done  . INFLUENZA VACCINE  Never done    There are no preventive care reminders to display for this patient.  CBC Latest Ref Rng & Units 08/31/2020 06/12/2020 06/11/2020  WBC 3.8 - 10.8 Thousand/uL 9.9 11.1(H) 15.7(H)  Hemoglobin 13.2 - 17.1 g/dL 15.0 13.9 13.9  Hematocrit 38.5 - 50.0 % 43.2 39.3 40.7  Platelets 140 - 400 Thousand/uL 202 174 184   CMP Latest Ref Rng & Units 08/31/2020 06/12/2020 06/11/2020  Glucose 65 - 99 mg/dL 68 101(H) -  BUN 7 - 25 mg/dL 12 15 -  Creatinine 0.70 - 1.25 mg/dL 0.77 0.68 0.80  Sodium 135 - 146 mmol/L 140 134(L) -  Potassium 3.5 - 5.3 mmol/L 4.4  4.3 -  Chloride 98 - 110 mmol/L 107 98 -  CO2 20 - 32 mmol/L 18(L) 27 -  Calcium 8.6 - 10.3 mg/dL 9.5 8.7(L) -  Total Protein 6.1 - 8.1 g/dL 8.2(H) - -  Total Bilirubin 0.2 - 1.2 mg/dL 0.8 - -  Alkaline Phos 38 - 126 U/L - - -  AST 10 - 35 U/L 21 - -  ALT 9 - 46 U/L 18 - -    Lab Results  Component Value Date   TSH 1.34 08/31/2020   Lab Results  Component Value Date   ALBUMIN 3.6 06/10/2020   ANIONGAP 9 06/12/2020   Lab Results  Component Value Date   CHOL 121 08/31/2020   CHOL 103 10/02/2019   HDL 41 08/31/2020   HDL 33 (L) 10/02/2019   LDLCALC 63 08/31/2020   LDLCALC 64 10/02/2019   CHOLHDL 3.0 08/31/2020   CHOLHDL 3.1 10/02/2019   Lab Results  Component Value Date   TRIG 90 08/31/2020   No results found for: HGBA1C    ASSESSMENT & PLAN:   Problem List Items Addressed This Visit      Cardiovascular and Mediastinum   Primary hypertension    HTN wnl, taking all meds as rx. Plan- Continue meds.        Respiratory   Chronic obstructive pulmonary disease ([place])    Saw Pulmonology last month, was started on Esbiret, will do a PFT in 2 months. Plan-  Continue all meds as rx.       Relevant Medications   Pirfenidone (ESBRIET) 267 MG CAPS   budesonide-formoterol (SYMBICORT) 160-4.5 MCG/ACT inhaler     Musculoskeletal and Integument   Abrasion of left arm - Primary    Patient with self limiting abrasion from mechanic work. Plan- Clean dressing and TDAP         Meds ordered this encounter  Medications  . budesonide-formoterol (SYMBICORT) 160-4.5 MCG/ACT inhaler    Sig: Inhale 2 puffs into the lungs 2 (two) times daily.    Dispense:  1 each    Refill:  3      Follow-up: No follow-ups on file.    Beckie Salts, [place] [address] [place], [place] [address]

## 2020-12-07 NOTE — Assessment & Plan Note (Signed)
Saw Pulmonology last month, was started on Esbiret, will do a PFT in 2 months. Plan-  Continue all meds as rx.

## 2020-12-21 ENCOUNTER — Other Ambulatory Visit: Payer: Self-pay | Admitting: *Deleted

## 2020-12-21 DIAGNOSIS — L03317 Cellulitis of buttock: Secondary | ICD-10-CM

## 2020-12-21 MED ORDER — SULFAMETHOXAZOLE-TRIMETHOPRIM 800-160 MG PO TABS: 1.0000 | ORAL_TABLET | Freq: Two times a day (BID) | ORAL | 0 refills | Status: DC

## 2021-01-08 ENCOUNTER — Other Ambulatory Visit: Payer: Self-pay | Admitting: Internal Medicine

## 2021-01-08 ENCOUNTER — Other Ambulatory Visit: Payer: Self-pay | Admitting: *Deleted

## 2021-01-08 MED ORDER — BEVESPI AEROSPHERE 9-4.8 MCG/ACT IN AERO: 1.0000 | INHALATION_SPRAY | Freq: Two times a day (BID) | RESPIRATORY_TRACT | 6 refills | Status: DC

## 2021-01-09 ENCOUNTER — Other Ambulatory Visit: Payer: Self-pay | Admitting: Internal Medicine

## 2021-01-09 ENCOUNTER — Ambulatory Visit: Payer: 59 | Admitting: Dermatology

## 2021-01-10 ENCOUNTER — Ambulatory Visit: Payer: 59 | Admitting: Dermatology

## 2021-01-21 ENCOUNTER — Other Ambulatory Visit: Payer: Self-pay | Admitting: Cardiovascular Disease

## 2021-01-21 NOTE — Telephone Encounter (Signed)
Rx request sent to pharmacy.  

## 2021-01-23 ENCOUNTER — Encounter: Payer: Self-pay | Admitting: Dermatology

## 2021-01-23 ENCOUNTER — Ambulatory Visit: Payer: 59 | Admitting: Dermatology

## 2021-01-23 ENCOUNTER — Other Ambulatory Visit: Payer: Self-pay

## 2021-01-23 DIAGNOSIS — L309 Dermatitis, unspecified: Secondary | ICD-10-CM

## 2021-01-23 DIAGNOSIS — L089 Local infection of the skin and subcutaneous tissue, unspecified: Secondary | ICD-10-CM | POA: Diagnosis not present

## 2021-01-23 DIAGNOSIS — L570 Actinic keratosis: Secondary | ICD-10-CM

## 2021-01-23 DIAGNOSIS — D492 Neoplasm of unspecified behavior of bone, soft tissue, and skin: Secondary | ICD-10-CM

## 2021-01-23 MED ORDER — TRIAMCINOLONE ACETONIDE 0.1 % EX OINT: 1.0000 "application " | TOPICAL_OINTMENT | Freq: Every day | CUTANEOUS | 2 refills | Status: DC | PRN

## 2021-01-23 MED ORDER — HYDROCORTISONE 2.5 % EX CREA: TOPICAL_CREAM | Freq: Two times a day (BID) | CUTANEOUS | 2 refills | Status: DC | PRN

## 2021-01-23 NOTE — Patient Instructions (Addendum)
Wound Care Instructions  1. Cleanse wound gently with soap and water once a day then pat dry with clean gauze. Apply a thing coat of Petrolatum (petroleum jelly, "Vaseline") over the wound (unless you have an allergy to this). We recommend that you use a new, sterile tube of Vaseline. Do not pick or remove scabs. Do not remove the yellow or white "healing tissue" from the base of the wound.  2. Cover the wound with fresh, clean, nonstick gauze and secure with paper tape. You may use Band-Aids in place of gauze and tape if the would is small enough, but would recommend trimming much of the tape off as there is often too much. Sometimes Band-Aids can irritate the skin.  3. You should call the office for your biopsy report after 1 week if you have not already been contacted.  4. If you experience any problems, such as abnormal amounts of bleeding, swelling, significant bruising, significant pain, or evidence of infection, please call the office immediately.  5. FOR ADULT SURGERY PATIENTS: If you need something for pain relief you may take 1 extra strength Tylenol (acetaminophen) AND 2 Ibuprofen (200mg  each) together every 4 hours as needed for pain. (do not take these if you are allergic to them or if you have a reason you should not take them.) Typically, you may only need pain medication for 1 to 3 days.    Gentle Skin Care Guide  1. Bathe no more than once a day.  2. Avoid bathing in hot water  3. Use a mild soap like Dove, Vanicream, Cetaphil, CeraVe. Can use Lever 2000 or Cetaphil antibacterial soap  4. Use soap only where you need it. On most days, use it under your arms, between your legs, and on your feet. Let the water rinse other areas unless visibly dirty.  5. When you get out of the bath/shower, use a towel to gently blot your skin dry, don't rub it.  6. While your skin is still a little damp, apply a moisturizing cream such as Vanicream, CeraVe, Cetaphil, Eucerin, Sarna lotion or  plain Vaseline Jelly. For hands apply Neutrogena [place] Hand Cream or Excipial Hand Cream.  7. Reapply moisturizer any time you start to itch or feel dry.  8. Sometimes using free and clear laundry detergents can be helpful. Fabric softener sheets should be avoided. Downy Free & Gentle liquid, or any liquid fabric softener that is free of dyes and perfumes, it acceptable to use  9. If your doctor has given you prescription creams you may apply moisturizers over them    Topical steroids (such as triamcinolone, fluocinolone, fluocinonide, mometasone, clobetasol, halobetasol, betamethasone, hydrocortisone) can cause thinning and lightening of the skin if they are used for too long in the same area. Your physician has selected the right strength medicine for your problem and area affected on the body. Please use your medication only as directed by your physician to prevent side effects.    If you have any questions or concerns for your doctor, please call our main line at [phone] and press option 4 to reach your doctor's medical assistant. If no one answers, please leave a voicemail as directed and we will return your call as soon as possible. Messages left after 4 pm will be answered the following business day.   You may also send [place] a message via [place]. We typically respond to MyChart messages within 1-2 business days.  For prescription refills, please ask your pharmacy to contact our  office. Our fax number is [phone].  If you have an urgent issue when the clinic is closed that cannot wait until the next business day, you can page your doctor at the number below.    Please note that while we do our best to be available for urgent issues outside of office hours, we are not available 24/7.   If you have an urgent issue and are unable to reach [place], you may choose to seek medical care at your doctor's office, retail clinic, urgent care center, or emergency room.  If you have a medical  emergency, please immediately call 911 or go to the emergency department.  Pager Numbers  - Dr. Nehemiah Massed: [phone]  - Dr. Laurence Ferrari: [phone]  - Dr. Nicole Kindred: [phone]  In the event of inclement weather, please call our main line at [phone] for an update on the status of any delays or closures.  Dermatology Medication Tips: Please keep the boxes that topical medications come in in order to help keep track of the instructions about where and how to use these. Pharmacies typically print the medication instructions only on the boxes and not directly on the medication tubes.   If your medication is too expensive, please contact our office at [phone] option 4 or send [place] a message through [place].   We are unable to tell what your co-pay for medications will be in advance as this is different depending on your insurance coverage. However, we may be able to find a substitute medication at lower cost or fill out paperwork to get insurance to cover a needed medication.   If a prior authorization is required to get your medication covered by your insurance company, please allow [place] 1-2 business days to complete this process.  Drug prices often vary depending on where the prescription is filled and some pharmacies may offer cheaper prices.  The website www.goodrx.com contains coupons for medications through different pharmacies. The prices here do not account for what the cost may be with help from insurance (it may be cheaper with your insurance), but the website can give you the price if you did not use any insurance.  - You can print the associated coupon and take it with your prescription to the pharmacy.  - You may also stop by our office during regular business hours and pick up a GoodRx coupon card.  - If you need your prescription sent electronically to a different pharmacy, notify our office through [facility] or by phone at [phone] option 4.

## 2021-01-23 NOTE — Progress Notes (Signed)
   New Patient Visit  Subjective  Dillon [name] is a 65 y.o. male who presents for the following: Rash (Face, and arms. 1 week. Itching. Moderate. Burns with sun exposure.) and Skin Problem (Was getting boils on buttocks, hospitalized and given oral antibiotics. Dx: with cellulitis.  Rash on arms since this has resolved. Takes Bactrim DS for the boils. Last flare was ~1 month ago but he has had several).  Wife with patient.   Review of Systems: No other skin or systemic complaints except as noted in HPI or Assessment and Plan.  Objective  Well appearing patient in no apparent distress; mood and affect are within normal limits.  A focused examination was performed including face, arms, legs, buttocks, back, abdomen. Relevant physical exam findings are noted in the Assessment and Plan.  Objective  Bilateral hands and forearms, posterior neck: Scaly erythematous papules and plaques  Objective  Right Upper Arm: 0.5cm pink papule   Objective  Left Thigh - Anterior, left buttocks: Hyperpigmented macules  Assessment & Plan  Dermatitis Bilateral hands and forearms, posterior neck  Eczema vs contact dermatitis  Start Triamcinolone 0.1% ointment Apply BID up to 2 weeks. Avoid applying to face, groin, and axilla. Use as directed. Risk of skin atrophy with long-term use reviewed.   HC 2.5% cream BID to affected areas on face 1-2 weeks PRN.   Topical steroids (such as triamcinolone, fluocinolone, fluocinonide, mometasone, clobetasol, halobetasol, betamethasone, hydrocortisone) can cause thinning and lightening of the skin if they are used for too long in the same area. Your physician has selected the right strength medicine for your problem and area affected on the body. Please use your medication only as directed by your physician to prevent side effects.    triamcinolone ointment (KENALOG) 0.1 % - Bilateral hands and forearms, posterior neck  hydrocortisone 2.5 % cream - Bilateral  hands and forearms, posterior neck  Neoplasm of skin Right Upper Arm  Skin / nail biopsy Type of biopsy: tangential   Informed consent: discussed and consent obtained   Anesthesia: the lesion was anesthetized in a standard fashion   Anesthesia comment:  Area prepped with alcohol Anesthetic:  1% lidocaine w/ epinephrine 1-100,000 buffered w/ 8.4% NaHCO3 Instrument used: flexible razor blade   Hemostasis achieved with: pressure, aluminum chloride and electrodesiccation   Outcome: patient tolerated procedure well   Post-procedure details: wound care instructions given   Post-procedure details comment:  Ointment and small bandage applied  Specimen 1 - Surgical pathology Differential Diagnosis: R/O BCC  Check Margins: No 0.5cm pink papule  Local infection of skin and subcutaneous tissue Left Thigh - Anterior, left buttocks  Hx of recurrent boils  Bacterial C&S performed today inside bilateral nares; results pending.   Other Related Procedures Anaerobic and Aerobic Culture  Other Related Medications mupirocin ointment (BACTROBAN) 2 %  Return in about 4 weeks (around 02/20/2021) for rash recheck and TBSE.   Dillon [name], Dillon [name], CMA, am acting as scribe for Forest Gleason, MD.  Documentation: Dillon [name] have reviewed the above documentation for accuracy and completeness, and Dillon [name] agree with the above.  Forest Gleason, MD

## 2021-01-29 ENCOUNTER — Telehealth: Payer: Self-pay

## 2021-01-29 ENCOUNTER — Other Ambulatory Visit: Payer: Self-pay

## 2021-01-29 DIAGNOSIS — L089 Local infection of the skin and subcutaneous tissue, unspecified: Secondary | ICD-10-CM

## 2021-01-29 LAB — ANAEROBIC AND AEROBIC CULTURE

## 2021-01-29 MED ORDER — MUPIROCIN 2 % EX OINT: 1.0000 "application " | TOPICAL_OINTMENT | Freq: Two times a day (BID) | CUTANEOUS | 0 refills | Status: AC

## 2021-01-29 NOTE — Telephone Encounter (Signed)
Left msg for patient to call office for bx and culture results, JS

## 2021-01-29 NOTE — Telephone Encounter (Signed)
Patient advised bx benign, culture + for MRSA. Mupirocin ointment sent in for pt to use to inside of nose BID x 10 days, JS

## 2021-01-29 NOTE — Progress Notes (Signed)
Mupirocin sent in for pt to use BOC x 10 days to inside of nose, JS

## 2021-01-29 NOTE — Telephone Encounter (Signed)
-----   Message from Alfonso Patten, MD sent at 01/29/2021 12:03 PM EDT ----- Diagnosis Skin , right upper arm LICHENOID KERATOSIS  Biopsy showed inflamed wisdom spot. No treatment needed.    Culture from nose due to recurrent boils shows MRSA bacteria in nose.  Recommend mupirocin thin layer twice a day to inside of nose on both sides for 10 days.  MAs please call. Thank you!

## 2021-02-04 ENCOUNTER — Encounter: Payer: Self-pay | Admitting: Dermatology

## 2021-02-11 ENCOUNTER — Other Ambulatory Visit: Payer: Self-pay | Admitting: Cardiovascular Disease

## 2021-02-11 DIAGNOSIS — I2510 Atherosclerotic heart disease of native coronary artery without angina pectoris: Secondary | ICD-10-CM

## 2021-02-14 ENCOUNTER — Encounter: Payer: Self-pay | Admitting: Cardiovascular Disease

## 2021-02-14 ENCOUNTER — Ambulatory Visit: Payer: 59 | Admitting: Cardiovascular Disease

## 2021-02-14 ENCOUNTER — Other Ambulatory Visit: Payer: Self-pay

## 2021-02-14 VITALS — BP 120/62 | HR 69 | Ht 74.0 in | Wt 191.4 lb

## 2021-02-14 DIAGNOSIS — I10 Essential (primary) hypertension: Secondary | ICD-10-CM | POA: Diagnosis not present

## 2021-02-14 DIAGNOSIS — E785 Hyperlipidemia, unspecified: Secondary | ICD-10-CM

## 2021-02-14 DIAGNOSIS — J849 Interstitial pulmonary disease, unspecified: Secondary | ICD-10-CM | POA: Diagnosis not present

## 2021-02-14 DIAGNOSIS — I2510 Atherosclerotic heart disease of native coronary artery without angina pectoris: Secondary | ICD-10-CM

## 2021-02-14 NOTE — Patient Instructions (Signed)

## 2021-02-14 NOTE — Progress Notes (Signed)
Cardiology Office Note   Date:  02/14/2021   ID:  Dillon [name], DOB [birth year], MRN [id number]  PCP:  [name], MD  Cardiologist:   [name], MD   Chief Complaint  Patient presents with  . Follow-up    6 month f/u no complaints today. Meds reviewed verbally with pt.      History of Present Illness: Dillon [name] is a 65 y.o. male who presents for follow-up visit regarding coronary artery disease. He has known history of coronary artery disease status post remote stenting of the right coronary artery, hyperlipidemia, pulmonary fibrosis and previous tobacco use. He presented in December 2020 with ST elevation myocardial infarction.  Cardiac catheterization showed occluded apical LAD supplying the distal inferior wall which was the culprit.  There was also 80% eccentric complex plaque rupture in the proximal LAD which was felt to be responsible for the distal embolization.  RCA stent was patent.  I performed successful PCI and drug-eluting stent placement to the proximal LAD.  I restored flow to the distal LAD just by wiring the occlusion site.  Ejection fraction was normal.  His myocardial infarction was in the setting of COVID-19 infection.  During last visit, I switched him from Brilinta to Plavix.  He has been doing extremely well with no chest pain or worsening dyspnea.  He does have known history of pulmonary fibrosis followed by pulmonary.   Past Medical History:  Diagnosis Date  . Coronary artery disease   . Elevated cholesterol   . Restless leg syndrome     Past Surgical History:  Procedure Laterality Date  . ANGIOPLASTY  2010   2 stents placed  . BACK SURGERY    . COLONOSCOPY WITH PROPOFOL N/A 03/05/2018   Procedure: COLONOSCOPY WITH PROPOFOL;  Surgeon: Virgel Manifold, MD;  Location: [facility] ENDOSCOPY;  Service: Endoscopy;  Laterality: N/A;  . CORONARY ANGIOPLASTY    . CORONARY/GRAFT ACUTE MI REVASCULARIZATION N/A 09/30/2019   Procedure: Coronary/Graft  Acute MI Revascularization;  Surgeon: Wellington Hampshire, MD;  Location: [facility];  Service: Cardiovascular;  Laterality: N/A;  . LEFT HEART CATH AND CORONARY ANGIOGRAPHY N/A 09/30/2019   Procedure: LEFT HEART CATH AND CORONARY ANGIOGRAPHY;  Surgeon: Wellington Hampshire, MD;  Location: [facility];  Service: Cardiovascular;  Laterality: N/A;     Current Outpatient Medications  Medication Sig Dispense Refill  . albuterol (VENTOLIN HFA) 108 (90 Base) MCG/ACT inhaler Inhale 1-2 puffs into the lungs every 6 (six) hours as needed for wheezing or shortness of breath.    . Aspirin Buf,CaCarb-MgCarb-MgO, 81 MG TABS Take 1 tablet by mouth daily.    Marland Kitchen atorvastatin (LIPITOR) 40 MG tablet Take 1 tablet (40 mg total) by mouth daily. 30 tablet 8  . BEVESPI AEROSPHERE 9-4.8 MCG/ACT AERO INHALE 1 PUFF INTO THE LUNGS IN THE MORNING AND AT BEDTIME. 10.7 each 6  . budesonide-formoterol (SYMBICORT) 160-4.5 MCG/ACT inhaler Inhale 2 puffs into the lungs 2 (two) times daily. 1 each 3  . carvedilol (COREG) 3.125 MG tablet TAKE 1 TABLET (3.125 MG TOTAL) BY MOUTH 2 (TWO) TIMES DAILY WITH A MEAL. 180 tablet 0  . clopidogrel (PLAVIX) 75 MG tablet Take 1 tablet (75 mg total) by mouth daily. 90 tablet 1  . HYDROcodone bit-homatropine (HYCODAN) 5-1.5 MG/5ML syrup Take by mouth as needed.    . hydrocortisone 2.5 % cream Apply topically 2 (two) times daily as needed (Rash). 30 g 2  . Ipratropium-Albuterol (COMBIVENT RESPIMAT)  20-100 MCG/ACT AERS respimat Inhale 1 puff into the lungs every 6 (six) hours. 1 each 6  . nitroGLYCERIN (NITROSTAT) 0.3 MG SL tablet Place 1 tablet (0.3 mg total) under the tongue every 5 (five) minutes as needed for chest pain. 25 tablet 3  . Pirfenidone (ESBRIET) 267 MG CAPS Take 3 capsules by mouth 3 (three) times daily.    . sildenafil (REVATIO) 20 MG tablet Take 1 tablet (20 mg total) by mouth as needed for up to 6 doses. 10 tablet 0  . triamcinolone ointment (KENALOG) 0.1 % Apply 1  application topically daily as needed. Avoid face, groin, underarms 80 g 2   No current facility-administered medications for this visit.    Allergies:   Ace inhibitors    Social History:  The patient  reports that he quit smoking about 11 years ago. He has never used smokeless tobacco. He reports current alcohol use. He reports previous drug use.   Family History:  The patient's family history includes Heart disease in his father and mother.    ROS:  Please see the history of present illness.   Otherwise, review of systems are positive for none.   All other systems are reviewed and negative.    PHYSICAL EXAM: VS:  BP 120/62 (BP Location: Left Arm, Patient Position: Sitting, Cuff Size: Normal)   Pulse 69   Ht 6\' 2"  (1.88 m)   Wt 191 lb 6 oz (86.8 kg)   SpO2 95%   BMI 24.57 kg/m  , BMI Body mass index is 24.57 kg/m. GEN: Well nourished, well developed, in no acute distress  HEENT: normal  Neck: no JVD, carotid bruits, or masses Cardiac: RRR; no murmurs, rubs, or gallops,no edema  Respiratory:  clear to auscultation bilaterally, normal work of breathing GI: soft, nontender, nondistended, + BS MS: no deformity or atrophy  Skin: warm and dry, no rash Neuro:  Strength and sensation are intact Psych: euthymic mood, full affect   EKG:  EKG is ordered today. The ekg ordered today demonstrates normal sinus rhythm with no significant ST or T wave changes.   Recent Labs: 08/31/2020: ALT 18; BUN 12; Creat 0.77; Hemoglobin 15.0; Platelets 202; Potassium 4.4; Sodium 140; TSH 1.34    Lipid Panel    Component Value Date/Time   CHOL 121 08/31/2020 1433   TRIG 90 08/31/2020 1433   HDL 41 08/31/2020 1433   CHOLHDL 3.0 08/31/2020 1433   VLDL 6 10/02/2019 0331   LDLCALC 63 08/31/2020 1433      Wt Readings from Last 3 Encounters:  02/14/21 191 lb 6 oz (86.8 kg)  12/07/20 195 lb (88.5 kg)  09/07/20 198 lb (89.8 kg)       No flowsheet data found.    ASSESSMENT AND  PLAN:  1.  Coronary artery disease involving native coronary arteries without angina: He is doing very well at the present time with no anginal symptoms.  Continue dual antiplatelet therapy as tolerated.  2.  Hyperlipidemia: Continue treatment with atorvastatin.  I reviewed most recent labs done in November which showed an LDL of 63.  3.  Essential hypertension: Blood pressures controlled on small dose carvedilol.  4.  Interstitial lung disease: Stable dyspnea.    Disposition:   FU with me in 6 months  Signed,  [name], MD  02/14/2021 3:02 PM    [facility]

## 2021-02-27 ENCOUNTER — Ambulatory Visit: Payer: 59 | Admitting: Dermatology

## 2021-02-27 ENCOUNTER — Other Ambulatory Visit: Payer: Self-pay

## 2021-02-27 DIAGNOSIS — D229 Melanocytic nevi, unspecified: Secondary | ICD-10-CM

## 2021-02-27 DIAGNOSIS — B078 Other viral warts: Secondary | ICD-10-CM | POA: Diagnosis not present

## 2021-02-27 DIAGNOSIS — L570 Actinic keratosis: Secondary | ICD-10-CM

## 2021-02-27 DIAGNOSIS — Z1283 Encounter for screening for malignant neoplasm of skin: Secondary | ICD-10-CM | POA: Diagnosis not present

## 2021-02-27 DIAGNOSIS — L309 Dermatitis, unspecified: Secondary | ICD-10-CM

## 2021-02-27 DIAGNOSIS — L821 Other seborrheic keratosis: Secondary | ICD-10-CM

## 2021-02-27 DIAGNOSIS — L814 Other melanin hyperpigmentation: Secondary | ICD-10-CM | POA: Diagnosis not present

## 2021-02-27 DIAGNOSIS — L578 Other skin changes due to chronic exposure to nonionizing radiation: Secondary | ICD-10-CM

## 2021-02-27 DIAGNOSIS — D1800 Hemangioma unspecified site: Secondary | ICD-10-CM

## 2021-02-27 NOTE — Progress Notes (Signed)
Follow-Up Visit   Subjective  Dillon [name] is a 65 y.o. male who presents for the following: FBSE (Patient here for full body skin exam and skin cancer screening. Patient does not have any hx of skin cancer. ) and Follow-up (Patient following up on rash at arms,hands and neck. Patient was prescribed TMC 0.1% ointment and HC 2.5% cream and advises rash has improved. ).   The following portions of the chart were reviewed this encounter and updated as appropriate:   Tobacco  Allergies  Meds  Problems  Med Hx  Surg Hx  Fam Hx      Review of Systems:  No other skin or systemic complaints except as noted in HPI or Assessment and Plan.  Objective  Well appearing patient in no apparent distress; mood and affect are within normal limits.  A full examination was performed including scalp, head, eyes, ears, nose, lips, neck, chest, axillae, abdomen, back, buttocks, bilateral upper extremities, bilateral lower extremities, hands, feet, fingers, toes, fingernails, and toenails. All findings within normal limits unless otherwise noted below.  Objective  Right medial brow x 1, right thigh x 1 (2): Erythematous thin papules/macules with gritty scale.   Objective  left cheek x 1: Verrucous papules -- Discussed viral etiology and contagion.    Assessment & Plan  AK (actinic keratosis) (2) Right medial brow x 1, right thigh x 1  Prior to procedure, discussed risks of blister formation, small wound, skin dyspigmentation, or rare scar following cryotherapy.    Destruction of lesion - Right medial brow x 1, right thigh x 1  Destruction method: cryotherapy   Informed consent: discussed and consent obtained   Lesion destroyed using liquid nitrogen: Yes   Cryotherapy cycles:  2 Outcome: patient tolerated procedure well with no complications   Post-procedure details: wound care instructions given    Other viral warts left cheek x 1  Prior to procedure, discussed risks of blister  formation, small wound, skin dyspigmentation, or rare scar following cryotherapy.   Discussed viral etiology and risk of spread.  Discussed multiple treatments may be required to clear warts.  Discussed possible post-treatment dyspigmentation and risk of recurrence.   Destruction of lesion - left cheek x 1  Destruction method: cryotherapy   Informed consent: discussed and consent obtained   Lesion destroyed using liquid nitrogen: Yes   Cryotherapy cycles:  2 Outcome: patient tolerated procedure well with no complications   Post-procedure details: wound care instructions given     Dermatitis - Clear today - Restart triamcinolone and hydrocortisone as directed if needed  Lentigines - Scattered tan macules - Due to sun exposure - Benign-appering, observe - Recommend daily broad spectrum sunscreen SPF 30+ to sun-exposed areas, reapply every 2 hours as needed. - Call for any changes  Seborrheic Keratoses - Stuck-on, waxy, tan-brown papules and/or plaques  - Benign-appearing - Discussed benign etiology and prognosis. - Observe - Call for any changes  Melanocytic Nevi - Tan-brown and/or pink-flesh-colored symmetric macules and papules - Benign appearing on exam today - Observation - Call clinic for new or changing moles - Recommend daily use of broad spectrum spf 30+ sunscreen to sun-exposed areas.   Hemangiomas - Red papules - Discussed benign nature - Observe - Call for any changes  Actinic Damage - Chronic condition, secondary to cumulative UV/sun exposure - diffuse scaly erythematous macules with underlying dyspigmentation - Recommend daily broad spectrum sunscreen SPF 30+ to sun-exposed areas, reapply every 2 hours as needed.  - Staying  in the shade or wearing long sleeves, sun glasses (UVA+UVB protection) and wide brim hats (4-inch brim around the entire circumference of the hat) are also recommended for sun protection.  - Call for new or changing lesions.  Skin  cancer screening performed today.   Return in about 3 months (around 05/30/2021) for AK follow up.  Graciella Belton, RMA, am acting as scribe for Forest Gleason, MD .  Documentation: I have reviewed the above documentation for accuracy and completeness, and I agree with the above.  Forest Gleason, MD

## 2021-02-27 NOTE — Patient Instructions (Addendum)
Cryotherapy Aftercare  . Wash gently with soap and water everyday.   Marland Kitchen Apply Vaseline and Band-Aid daily until healed.  Prior to procedure, discussed risks of blister formation, small wound, skin dyspigmentation, or rare scar following cryotherapy.   Recommend daily broad spectrum sunscreen SPF 30+ to sun-exposed areas, reapply every 2 hours as needed. Call for new or changing lesions.  Staying in the shade or wearing long sleeves, sun glasses (UVA+UVB protection) and wide brim hats (4-inch brim around the entire circumference of the hat) are also recommended for sun protection.   If you have any questions or concerns for your doctor, please call our main line at [phone] and press option 4 to reach your doctor's medical assistant. If no one answers, please leave a voicemail as directed and we will return your call as soon as possible. Messages left after 4 pm will be answered the following business day.   You may also send [place] a message via [place]. We typically respond to MyChart messages within 1-2 business days.  For prescription refills, please ask your pharmacy to contact our office. Our fax number is [phone].  If you have an urgent issue when the clinic is closed that cannot wait until the next business day, you can page your doctor at the number below.    Please note that while we do our best to be available for urgent issues outside of office hours, we are not available 24/7.   If you have an urgent issue and are unable to reach [place], you may choose to seek medical care at your doctor's office, retail clinic, urgent care center, or emergency room.  If you have a medical emergency, please immediately call 911 or go to the emergency department.  Pager Numbers  - Dr. Nehemiah Massed: [phone]  - Dr. Laurence Ferrari: [phone]  - Dr. Nicole Kindred: [phone]  In the event of inclement weather, please call our main line at [phone] for an update on the status of any delays or  closures.  Dermatology Medication Tips: Please keep the boxes that topical medications come in in order to help keep track of the instructions about where and how to use these. Pharmacies typically print the medication instructions only on the boxes and not directly on the medication tubes.   If your medication is too expensive, please contact our office at [phone] option 4 or send [place] a message through [place].   We are unable to tell what your co-pay for medications will be in advance as this is different depending on your insurance coverage. However, we may be able to find a substitute medication at lower cost or fill out paperwork to get insurance to cover a needed medication.   If a prior authorization is required to get your medication covered by your insurance company, please allow [place] 1-2 business days to complete this process.  Drug prices often vary depending on where the prescription is filled and some pharmacies may offer cheaper prices.  The website www.goodrx.com contains coupons for medications through different pharmacies. The prices here do not account for what the cost may be with help from insurance (it may be cheaper with your insurance), but the website can give you the price if you did not use any insurance.  - You can print the associated coupon and take it with your prescription to the pharmacy.  - You may also stop by our office during regular business hours and pick up a GoodRx coupon card.  - If you need your  prescription sent electronically to a different pharmacy, notify our office through [facility] or by phone at [phone] option 4.

## 2021-03-10 ENCOUNTER — Encounter: Payer: Self-pay | Admitting: Dermatology

## 2021-05-12 ENCOUNTER — Other Ambulatory Visit: Payer: Self-pay | Admitting: Cardiovascular Disease

## 2021-05-12 DIAGNOSIS — I2510 Atherosclerotic heart disease of native coronary artery without angina pectoris: Secondary | ICD-10-CM

## 2021-06-06 ENCOUNTER — Other Ambulatory Visit: Payer: Self-pay

## 2021-06-06 ENCOUNTER — Ambulatory Visit: Payer: 59 | Admitting: Dermatology

## 2021-06-06 DIAGNOSIS — L578 Other skin changes due to chronic exposure to nonionizing radiation: Secondary | ICD-10-CM | POA: Diagnosis not present

## 2021-06-06 DIAGNOSIS — L821 Other seborrheic keratosis: Secondary | ICD-10-CM

## 2021-06-06 DIAGNOSIS — Z872 Personal history of diseases of the skin and subcutaneous tissue: Secondary | ICD-10-CM | POA: Diagnosis not present

## 2021-06-06 NOTE — Progress Notes (Signed)
   Follow-Up Visit   Subjective  Dillon [name] is a 65 y.o. male who presents for the following: Follow-up (Patient here today for 3 month AK follow up at right medial brow and right thigh. Patient also had a wart treated at left cheek. Patient has noticed a spot under right eye. ).  Patient accompanied by wife.  The following portions of the chart were reviewed this encounter and updated as appropriate:   Tobacco  Allergies  Meds  Problems  Med Hx  Surg Hx  Fam Hx      Review of Systems:  No other skin or systemic complaints except as noted in HPI or Assessment and Plan.  Objective  Well appearing patient in no apparent distress; mood and affect are within normal limits.  A focused examination was performed including face, right thigh. Relevant physical exam findings are noted in the Assessment and Plan.    Assessment & Plan   History of PreCancerous Actinic Keratosis  - site(s) of PreCancerous Actinic Keratosis clear today. - these may recur and new lesions may form requiring treatment to prevent transformation into skin cancer - observe for new or changing spots and contact [place] for appointment if occur - photoprotection with sun protective clothing; sunglasses and broad spectrum sunscreen with SPF of at least 30 + and frequent self skin exams recommended - yearly exams by a dermatologist recommended for persons with history of PreCancerous Actinic Keratoses  Seborrheic Keratoses - Stuck-on, waxy, tan-brown papules and/or plaques  - Benign-appearing - Discussed benign etiology and prognosis. - Observe - Call for any changes  Actinic Damage - chronic, secondary to cumulative UV radiation exposure/sun exposure over time - diffuse scaly erythematous macules with underlying dyspigmentation - Recommend daily broad spectrum sunscreen SPF 30+ to sun-exposed areas, reapply every 2 hours as needed.  - Recommend staying in the shade or wearing long sleeves,  sun glasses (UVA+UVB protection) and wide brim hats (4-inch brim around the entire circumference of the hat). - Call for new or changing lesions.  Return in about 9 months (around 03/06/2022) for TBSE.  Graciella Belton, RMA, am acting as scribe for Forest Gleason, MD .  Documentation: I have reviewed the above documentation for accuracy and completeness, and I agree with the above.  Forest Gleason, MD

## 2021-06-06 NOTE — Patient Instructions (Signed)

## 2021-06-12 ENCOUNTER — Encounter: Payer: Self-pay | Admitting: Dermatology

## 2021-06-12 ENCOUNTER — Encounter: Payer: Self-pay | Admitting: Internal Medicine

## 2021-06-12 ENCOUNTER — Other Ambulatory Visit: Payer: Self-pay

## 2021-06-12 ENCOUNTER — Ambulatory Visit (INDEPENDENT_AMBULATORY_CARE_PROVIDER_SITE_OTHER): Payer: 59 | Admitting: Internal Medicine

## 2021-06-12 VITALS — BP 141/76 | HR 68 | Ht 74.0 in | Wt 180.6 lb

## 2021-06-12 DIAGNOSIS — I10 Essential (primary) hypertension: Secondary | ICD-10-CM | POA: Diagnosis not present

## 2021-06-12 DIAGNOSIS — R634 Abnormal weight loss: Secondary | ICD-10-CM | POA: Diagnosis not present

## 2021-06-12 DIAGNOSIS — J449 Chronic obstructive pulmonary disease, unspecified: Secondary | ICD-10-CM

## 2021-06-12 DIAGNOSIS — J8410 Pulmonary fibrosis, unspecified: Secondary | ICD-10-CM

## 2021-06-12 DIAGNOSIS — N390 Urinary tract infection, site not specified: Secondary | ICD-10-CM | POA: Diagnosis not present

## 2021-06-12 DIAGNOSIS — I2583 Coronary atherosclerosis due to lipid rich plaque: Secondary | ICD-10-CM

## 2021-06-12 DIAGNOSIS — I2510 Atherosclerotic heart disease of native coronary artery without angina pectoris: Secondary | ICD-10-CM

## 2021-06-12 MED ORDER — CIPROFLOXACIN HCL 500 MG PO TABS: 500.0000 mg | ORAL_TABLET | Freq: Two times a day (BID) | ORAL | 0 refills | Status: AC

## 2021-06-12 NOTE — Assessment & Plan Note (Signed)
Patient urine shows blood in the urine hewas given a course of antibiotic for 7 days,

## 2021-06-12 NOTE — Assessment & Plan Note (Signed)
Patient is on statin, he denies any chest pain, complains of shortness of breath on exertion.  He does not smoke or drink

## 2021-06-12 NOTE — Progress Notes (Signed)
Established Patient Office Visit  Subjective:  Patient ID: Dillon [name], male    DOB: 1956/02/04  Age: 65 y.o. MRN: [id number]  CC:  Chief Complaint  Patient presents with   Follow-up    HPI  Dillon [name] presents for wt loss, he is known to have fibrosis .  He is under care of pulmonary specialist.  He still has problems with cough and shortness of breath is on 2 L of oxygen per minute.  He denies any chest pain plan: He lost 10 pounds the last 4 months, he was found to have Blood in the urine on today's examination Scheduled for CT scan of the abdomen   to check for hematuria, he was also advised to use his oxygen io which willn a regular basis  Past Medical History:  Diagnosis Date   Coronary artery disease    Elevated cholesterol    Restless leg syndrome     Past Surgical History:  Procedure Laterality Date   ANGIOPLASTY  2010   2 stents placed   BACK SURGERY     COLONOSCOPY WITH PROPOFOL N/A 03/05/2018   Procedure: COLONOSCOPY WITH PROPOFOL;  Surgeon: Virgel Manifold, MD;  Location: [facility] END[facility]COPY;  Service: Endoscopy;  Laterality: N/A;   CORONARY ANGIOPLASTY     CORONARY/GRAFT ACUTE MI REVASCULARIZATION N/A 09/30/2019   Procedure: Coronary/Graft Acute MI Revascularization;  Surgeon: Wellington Hampshire, MD;  Location: [place] [facility];  Service: Cardiovascular;  Laterality: N/A;   LEFT HEART CATH AND CORONARY ANGIOGRAPHY N/A 09/30/2019   Procedure: LEFT HEART CATH AND CORONARY ANGIOGRAPHY;  Surgeon: Wellington Hampshire, MD;  Location: [place] [facility];  Service: Cardiovascular;  Laterality: N/A;    Family History  Problem Relation Age of Onset   Heart disease Mother    Heart disease Father     Social History   Socioeconomic History   Marital status: Married    Spouse name: Not on file   Number of children: Not on file   Years of education: Not on file   Highest education level: Not on file  Occupational History   Not on file  Tobacco Use    Smoking status: Former    Types: Cigarettes    Quit date: 03/05/2009    Years since quitting: 12.2   Smokeless tobacco: Never  Vaping Use   Vaping Use: Never used  Substance and Sexual Activity   Alcohol use: Yes    Comment: occassionally   Drug use: Not Currently   Sexual activity: Not on file  Other Topics Concern   Not on file  Social History Narrative   Not on file   Social Determinants of Health   Financial Resource Strain: Not on file  Food Insecurity: Not on file  Transportation Needs: Not on file  Physical Activity: Not on file  Stress: Not on file  Social Connections: Not on file  Intimate Partner Violence: Not on file     Current Outpatient Medications:    albuterol (VENTOLIN HFA) 108 (90 Base) MCG/ACT inhaler, Inhale 1-2 puffs into the lungs every 6 (six) hours as needed for wheezing or shortness of breath., Disp: , Rfl:    Aspirin Buf,CaCarb-MgCarb-MgO, 81 MG TABS, Take 1 tablet by mouth daily., Disp: , Rfl:    atorvastatin (LIPITOR) 40 MG tablet, Take 1 tablet (40 mg total) by mouth daily., Disp: 30 tablet, Rfl: 8   BEVESPI AEROSPHERE 9-4.8 MCG/ACT AERO, INHALE 1 PUFF INTO THE LUNGS IN THE MORNING  AND AT BEDTIME., Disp: 10.7 each, Rfl: 6   budesonide-formoterol (SYMBICORT) 160-4.5 MCG/ACT inhaler, Inhale 2 puffs into the lungs 2 (two) times daily., Disp: 1 each, Rfl: 3   carvedilol (COREG) 3.125 MG tablet, TAKE 1 TABLET (3.125 MG TOTAL) BY MOUTH 2 (TWO) TIMES DAILY WITH A MEAL., Disp: 180 tablet, Rfl: 2   ciprofloxacin (CIPRO) 500 MG tablet, Take 1 tablet (500 mg total) by mouth 2 (two) times daily for 5 days., Disp: 10 tablet, Rfl: 0   clopidogrel (PLAVIX) 75 MG tablet, Take 1 tablet (75 mg total) by mouth daily., Disp: 90 tablet, Rfl: 1   Ipratropium-Albuterol (COMBIVENT RESPIMAT) 20-100 MCG/ACT AERS respimat, Inhale 1 puff into the lungs every 6 (six) hours., Disp: 1 each, Rfl: 6   nitroGLYCERIN (NITROSTAT) 0.3 MG SL tablet, Place 1 tablet (0.3 mg total) under  the tongue every 5 (five) minutes as needed for chest pain., Disp: 25 tablet, Rfl: 3   Pirfenidone (ESBRIET) 267 MG CAPS, Take 3 capsules by mouth 3 (three) times daily., Disp: , Rfl:    sildenafil (REVATIO) 20 MG tablet, Take 1 tablet (20 mg total) by mouth as needed for up to 6 doses., Disp: 10 tablet, Rfl: 0   triamcinolone ointment (KENALOG) 0.1 %, Apply 1 application topically daily as needed. Avoid face, groin, underarms, Disp: 80 g, Rfl: 2   Allergies  Allergen Reactions   Ace Inhibitors Swelling    Causes cough and swelling    ROS Review of Systems  Constitutional:  Positive for appetite change and fatigue.  HENT:  Positive for congestion.   Respiratory:  Positive for cough and shortness of breath.   Cardiovascular:  Negative for chest pain.  Gastrointestinal:  Negative for abdominal distention, abdominal pain and anal bleeding.  Genitourinary:  Positive for difficulty urinating.  Musculoskeletal:  Negative for myalgias.  Neurological:  Negative for seizures and syncope.     Objective:    Physical Exam Vitals reviewed.  Constitutional:      Appearance: Normal appearance.  HENT:     Mouth/Throat:     Mouth: Mucous membranes are moist.  Eyes:     Pupils: Pupils are equal, round, and reactive to light.  Neck:     Vascular: No carotid bruit.  Cardiovascular:     Rate and Rhythm: Normal rate and regular rhythm.     Pulses: Normal pulses.     Heart sounds: Normal heart sounds.  Pulmonary:     Breath sounds: No wheezing or rhonchi.  Chest:     Chest wall: No tenderness.  Abdominal:     General: Bowel sounds are normal.     Palpations: Abdomen is soft. There is no hepatomegaly, splenomegaly or mass.     Tenderness: There is no abdominal tenderness.     Hernia: No hernia is present.  Musculoskeletal:     Cervical back: Neck supple.     Right lower leg: No edema.     Left lower leg: No edema.  Skin:    Findings: No rash.  Neurological:     Mental Status: He is  alert and oriented to person, place, and time.     Motor: No weakness.  Psychiatric:        Mood and Affect: Mood normal.        Behavior: Behavior normal.    BP (!) 141/76   Pulse 68   Ht '6\' 2"'$  (1.88 m)   Wt 180 lb 9.6 oz (81.9 kg)   BMI 23.19 kg/m  Wt Readings from Last 3 Encounters:  06/12/21 180 lb 9.6 oz (81.9 kg)  02/14/21 191 lb 6 oz (86.8 kg)  12/07/20 195 lb (88.5 kg)     Health Maintenance Due  Topic Date Due   Hepatitis C Screening  Never done   Zoster Vaccines- Shingrix (1 of 2) Never done   Pneumococcal Vaccine 59-50 Years old (2 - PCV) 10/02/2020   COVID-19 Vaccine (4 - Booster for Pfizer series) 12/06/2020   COLONOSCOPY (Pts 45-2yr Insurance coverage will need to be confirmed)  03/05/2021   INFLUENZA VACCINE  05/20/2021    There are no preventive care reminders to display for this patient.  Lab Results  Component Value Date   TSH 1.34 08/31/2020   Lab Results  Component Value Date   WBC 9.9 08/31/2020   HGB 15.0 08/31/2020   HCT 43.2 08/31/2020   MCV 95.2 08/31/2020   PLT 202 08/31/2020   Lab Results  Component Value Date   NA 140 08/31/2020   K 4.4 08/31/2020   CO2 18 (L) 08/31/2020   GLUCOSE 68 08/31/2020   BUN 12 08/31/2020   CREATININE 0.77 08/31/2020   BILITOT 0.8 08/31/2020   ALKPHOS 93 06/10/2020   AST 21 08/31/2020   ALT 18 08/31/2020   PROT 8.2 (H) 08/31/2020   ALBUMIN 3.6 06/10/2020   CALCIUM 9.5 08/31/2020   ANIONGAP 9 06/12/2020   Lab Results  Component Value Date   CHOL 121 08/31/2020   Lab Results  Component Value Date   HDL 41 08/31/2020   Lab Results  Component Value Date   LDLCALC 63 08/31/2020   Lab Results  Component Value Date   TRIG 90 08/31/2020   Lab Results  Component Value Date   CHOLHDL 3.0 08/31/2020   No results found for: HGBA1C    Assessment & Plan:   Problem List Items Addressed This Visit       Cardiovascular and Mediastinum   Coronary artery disease due to lipid rich plaque     Patient is on statin, he denies any chest pain, complains of shortness of breath on exertion.  He does not smoke or drink      Primary hypertension     Patient denies any chest pain or shortness of breath there is no history of palpitation or paroxysmal nocturnal dyspnea   patient was advised to follow low-salt low-cholesterol diet    ideally I want to keep systolic blood pressure below 130 mmHg, patient was asked to check blood pressure one times a week and give me a report on that.  Patient will be follow-up in 3 months  or earlier as needed, patient will call me back for any change in the cardiovascular symptoms Patient was advised to buy a book from local bookstore concerning blood pressure and read several chapters  every day.  This will be supplemented by some of the material we will give him from the office.  Patient should also utilize other resources like YouTube and Internet to learn more about the blood pressure and the diet.        Respiratory   Pulmonary fibrosis (HCC)    Lost a lot of weight so I asked him to drink Ensure 1 can twice a day and useoxygen on a regular basis      [place] obstructive [place] disease [place]    Patient is under care of her pulmonary specialist        Other   Weight loss - Primary  Patient urine shows blood in the urine hewas given a course of antibiotic for 7 days,      Relevant Orders   PSA   TSH   CBC with Differential/Platelet   COMPLETE METABOLIC PANEL WITH GFR    Meds ordered this encounter  Medications   ciprofloxacin (CIPRO) 500 MG tablet    Sig: Take 1 tablet (500 mg total) by mouth 2 (two) times daily for 5 days.    Dispense:  10 tablet    Refill:  0    Follow-up: No follow-ups on file.    [name], MD

## 2021-06-12 NOTE — Assessment & Plan Note (Signed)
Patient is under care of her pulmonary specialist

## 2021-06-12 NOTE — Assessment & Plan Note (Signed)
Lost a lot of weight so I asked him to drink Ensure 1 can twice a day and useoxygen on a regular basis

## 2021-06-12 NOTE — Assessment & Plan Note (Signed)

## 2021-06-13 ENCOUNTER — Other Ambulatory Visit: Payer: Self-pay | Admitting: Internal Medicine

## 2021-06-13 LAB — CBC WITH DIFFERENTIAL/PLATELET
Absolute Monocytes: 1020 cells/uL — ABNORMAL HIGH (ref 200–950)
Basophils Absolute: 71 cells/uL (ref 0–200)
Basophils Relative: 0.7 %
Eosinophils Absolute: 418 cells/uL (ref 15–500)
Eosinophils Relative: 4.1 %
HCT: 41.3 % (ref 38.5–50.0)
Hemoglobin: 14.5 g/dL (ref 13.2–17.1)
Lymphs Abs: 2050 cells/uL (ref 850–3900)
MCH: 34.2 pg — ABNORMAL HIGH (ref 27.0–33.0)
MCHC: 35.1 g/dL (ref 32.0–36.0)
MCV: 97.4 fL (ref 80.0–100.0)
MPV: 8.2 fL (ref 7.5–12.5)
Monocytes Relative: 10 %
Neutro Abs: 6640 cells/uL (ref 1500–7800)
Neutrophils Relative %: 65.1 %
Platelets: 223 10*3/uL (ref 140–400)
RBC: 4.24 10*6/uL (ref 4.20–5.80)
RDW: 12.4 % (ref 11.0–15.0)
Total Lymphocyte: 20.1 %
WBC: 10.2 10*3/uL (ref 3.8–10.8)

## 2021-06-13 LAB — COMPLETE METABOLIC PANEL WITH GFR
AG Ratio: 0.9 (calc) — ABNORMAL LOW (ref 1.0–2.5)
ALT: 22 U/L (ref 9–46)
AST: 19 U/L (ref 10–35)
Albumin: 3.7 g/dL (ref 3.6–5.1)
Alkaline phosphatase (APISO): 97 U/L (ref 35–144)
BUN: 11 mg/dL (ref 7–25)
CO2: 27 mmol/L (ref 20–32)
Calcium: 8.8 mg/dL (ref 8.6–10.3)
Chloride: 100 mmol/L (ref 98–110)
Creat: 0.72 mg/dL (ref 0.70–1.35)
Globulin: 4.2 g/dL (calc) — ABNORMAL HIGH (ref 1.9–3.7)
Glucose, Bld: 95 mg/dL (ref 65–99)
Potassium: 4 mmol/L (ref 3.5–5.3)
Sodium: 135 mmol/L (ref 135–146)
Total Bilirubin: 0.4 mg/dL (ref 0.2–1.2)
Total Protein: 7.9 g/dL (ref 6.1–8.1)
eGFR: 102 mL/min/{1.73_m2} (ref >=60)

## 2021-06-13 LAB — TSH: TSH: 3.75 mIU/L (ref 0.40–4.50)

## 2021-06-13 LAB — PSA: PSA: 0.48 ng/mL (ref <=4.00)

## 2021-06-13 NOTE — Addendum Note (Signed)
Addended by: Lacretia Nicks L on: 06/13/2021 04:00 PM   Modules accepted: Orders

## 2021-06-18 NOTE — Addendum Note (Signed)
Addended by: Alois Cliche on: 06/18/2021 09:20 AM   Modules accepted: Orders

## 2021-06-20 ENCOUNTER — Other Ambulatory Visit: Payer: Self-pay

## 2021-06-20 MED ORDER — COMBIVENT RESPIMAT 20-100 MCG/ACT IN AERS: 1.0000 | INHALATION_SPRAY | Freq: Four times a day (QID) | RESPIRATORY_TRACT | 6 refills | Status: DC

## 2021-06-25 ENCOUNTER — Other Ambulatory Visit: Payer: Self-pay | Admitting: *Deleted

## 2021-06-25 LAB — POCT URINALYSIS DIPSTICK
Bilirubin, UA: NEGATIVE
Blood, UA: POSITIVE
Glucose, UA: NEGATIVE
Ketones, UA: POSITIVE
Nitrite, UA: NEGATIVE
Protein, UA: POSITIVE — AB
Spec Grav, UA: >=1.03 — AB (ref 1.010–1.025)
Urobilinogen, UA: NEGATIVE E.U./dL — AB
pH, UA: 6 (ref 5.0–8.0)

## 2021-06-25 MED ORDER — CIPROFLOXACIN HCL 500 MG PO TABS: 500.0000 mg | ORAL_TABLET | Freq: Two times a day (BID) | ORAL | 0 refills | Status: AC

## 2021-06-25 NOTE — Addendum Note (Signed)
Addended by: Alois Cliche on: 06/25/2021 02:27 PM   Modules accepted: Orders

## 2021-07-01 ENCOUNTER — Inpatient Hospital Stay: Payer: 59 | Attending: Oncology | Admitting: Oncology

## 2021-07-01 ENCOUNTER — Encounter: Payer: Self-pay | Admitting: Oncology

## 2021-07-01 ENCOUNTER — Inpatient Hospital Stay: Payer: 59

## 2021-07-01 VITALS — BP 115/74 | HR 69 | Temp 98.4°F | Resp 18 | Wt 178.2 lb

## 2021-07-01 DIAGNOSIS — K648 Other hemorrhoids: Secondary | ICD-10-CM

## 2021-07-01 DIAGNOSIS — J8410 Pulmonary fibrosis, unspecified: Secondary | ICD-10-CM

## 2021-07-01 DIAGNOSIS — R634 Abnormal weight loss: Secondary | ICD-10-CM | POA: Insufficient documentation

## 2021-07-01 DIAGNOSIS — Z87891 Personal history of nicotine dependence: Secondary | ICD-10-CM | POA: Diagnosis not present

## 2021-07-01 DIAGNOSIS — I10 Essential (primary) hypertension: Secondary | ICD-10-CM | POA: Insufficient documentation

## 2021-07-01 LAB — COMPREHENSIVE METABOLIC PANEL
ALT: 21 U/L (ref 0–44)
AST: 18 U/L (ref 15–41)
Albumin: 3.7 g/dL (ref 3.5–5.0)
Alkaline Phosphatase: 88 U/L (ref 38–126)
Anion gap: 5 (ref 5–15)
BUN: 14 mg/dL (ref 8–23)
CO2: 29 mmol/L (ref 22–32)
Calcium: 9 mg/dL (ref 8.9–10.3)
Chloride: 104 mmol/L (ref 98–111)
Creatinine, Ser: 0.78 mg/dL (ref 0.61–1.24)
GFR, Estimated: >60 mL/min (ref >60)
Glucose, Bld: 83 mg/dL (ref 70–99)
Potassium: 4.2 mmol/L (ref 3.5–5.1)
Sodium: 138 mmol/L (ref 135–145)
Total Bilirubin: 0.5 mg/dL (ref 0.3–1.2)
Total Protein: 8.6 g/dL — ABNORMAL HIGH (ref 6.5–8.1)

## 2021-07-01 LAB — CBC WITH DIFFERENTIAL/PLATELET
Abs Immature Granulocytes: 0.04 10*3/uL (ref 0.00–0.07)
Basophils Absolute: 0.1 10*3/uL (ref 0.0–0.1)
Basophils Relative: 1 %
Eosinophils Absolute: 0.4 10*3/uL (ref 0.0–0.5)
Eosinophils Relative: 5 %
HCT: 46.4 % (ref 39.0–52.0)
Hemoglobin: 15.3 g/dL (ref 13.0–17.0)
Immature Granulocytes: 1 %
Lymphocytes Relative: 28 %
Lymphs Abs: 2 10*3/uL (ref 0.7–4.0)
MCH: 33.4 pg (ref 26.0–34.0)
MCHC: 33 g/dL (ref 30.0–36.0)
MCV: 101.3 fL — ABNORMAL HIGH (ref 80.0–100.0)
Monocytes Absolute: 0.7 10*3/uL (ref 0.1–1.0)
Monocytes Relative: 10 %
Neutro Abs: 3.8 10*3/uL (ref 1.7–7.7)
Neutrophils Relative %: 55 %
Platelets: 175 10*3/uL (ref 150–400)
RBC: 4.58 MIL/uL (ref 4.22–5.81)
RDW: 13.5 % (ref 11.5–15.5)
WBC: 6.9 10*3/uL (ref 4.0–10.5)
nRBC: 0 % (ref 0.0–0.2)

## 2021-07-01 LAB — FOLATE: Folate: 15.5 ng/mL (ref >5.9)

## 2021-07-01 LAB — LACTATE DEHYDROGENASE: LDH: 174 U/L (ref 98–192)

## 2021-07-01 LAB — VITAMIN B12: Vitamin B-12: 888 pg/mL (ref 180–914)

## 2021-07-01 LAB — FERRITIN: Ferritin: 182 ng/mL (ref 24–336)

## 2021-07-01 NOTE — Progress Notes (Signed)
Legs feel weak at times; had covid two years ago and per wife pt has not been the same since and has not stopped losing weight. Wife states a couple of weeks ago pt had a nosebleed for about 30 minutes to the point where they believed EMS would have to come out. Cut back on plavix every other day since his nosebleed episode.

## 2021-07-01 NOTE — Progress Notes (Signed)
Hematology/Oncology Consult note [facility] [phone] [phone]  Patient Care Team: [name], MD as PCP - General (Internal Medicine) Wellington Hampshire, MD as PCP - Cardiology (Cardiology)   Name of the patient: Dillon [name]  [id number]  [birth year]    Reason for referral-unintentional weight loss   Referring physician-Dr. Rebecka Apley  Date of visit: 07/01/21   History of presenting illness-patient is a 65 year old male who is an ex-smoker and had COVID about a year ago.  He has been referred for unintentional weight loss.  He has lost about 11 pounds in the last5 months but has had ongoing weight loss ever since he was diagnosed with COVID.  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  He follows up with pulmonary for this.  Currently patient reports that his appetite is good but he continues to lose weight.  Also reports pressure-like sensation in his suprapubic area.  He was started on antibiotics for possible UTI recently as well.  Last colonoscopy was in 2019 by Dr. Bonna Gains.  ECOG PS- 1  Pain scale- 0   Review of systems- Review of Systems  Constitutional:  Positive for malaise/fatigue and weight loss.  Respiratory:  Positive for shortness of breath.   Gastrointestinal:  Positive for abdominal pain.   Allergies  Allergen Reactions   Ace Inhibitors Swelling    Causes cough and swelling    Patient Active Problem List   Diagnosis Date Noted   Weight loss 06/12/2021   Abrasion of left arm 12/07/2020   Chronic obstructive pulmonary disease ([place]) 12/07/2020   Primary hypertension 12/07/2020   Fever in adult 09/07/2020   Hyperlipidemia 09/07/2020   Cellulitis of left buttock 06/11/2020   Pulmonary fibrosis ([place]) 05/11/2020   Coronary artery disease due to lipid rich plaque 05/11/2020   Dermatitis 05/11/2020   Disorder of left  rotator cuff 03/09/2020   ED (erectile dysfunction) of non-organic origin 03/09/2020   ST elevation myocardial infarction (STEMI) ([place])    COVID-19 virus infection    Acute ST elevation myocardial infarction (STEMI) of anterior wall ([place]) 09/30/2019   Annual physical exam    Rectal polyp    Benign neoplasm of ascending colon    Internal hemorrhoids    Proctitis    Diverticulosis of large intestine without diverticulitis      Past Medical History:  Diagnosis Date   Coronary artery disease    Elevated cholesterol    Restless leg syndrome      Past Surgical History:  Procedure Laterality Date   ANGIOPLASTY  2010   2 stents placed   BACK SURGERY     COLONOSCOPY WITH PROPOFOL N/A 03/05/2018   Procedure: COLONOSCOPY WITH PROPOFOL;  Surgeon: Virgel Manifold, MD;  Location: [facility] ENDOSCOPY;  Service: Endoscopy;  Laterality: N/A;   CORONARY ANGIOPLASTY     CORONARY/GRAFT ACUTE MI REVASCULARIZATION N/A 09/30/2019   Procedure: Coronary/Graft Acute MI Revascularization;  Surgeon: Wellington Hampshire, MD;  Location: [place] [facility];  Service: Cardiovascular;  Laterality: N/A;   LEFT HEART CATH AND CORONARY ANGIOGRAPHY N/A 09/30/2019   Procedure: LEFT HEART CATH AND CORONARY ANGIOGRAPHY;  Surgeon: Wellington Hampshire, MD;  Location: [place] [facility];  Service: Cardiovascular;  Laterality: N/A;    Social History   Socioeconomic History   Marital status: Married    Spouse name: Not on file   Number of children: Not on file  Years of education: Not on file   Highest education level: Not on file  Occupational History   Not on file  Tobacco Use   Smoking status: Former    Types: Cigarettes    Quit date: 03/05/2009    Years since quitting: 12.3   Smokeless tobacco: Never  Vaping Use   Vaping Use: Never used  Substance and Sexual Activity   Alcohol use: Yes    Comment: occassionally   Drug use: Not Currently   Sexual activity: Not on file  Other Topics Concern   Not on  file  Social History Narrative   Not on file   Social Determinants of Health   Financial Resource Strain: Not on file  Food Insecurity: Not on file  Transportation Needs: Not on file  Physical Activity: Not on file  Stress: Not on file  Social Connections: Not on file  Intimate Partner Violence: Not on file     Family History  Problem Relation Age of Onset   Heart disease Mother    Heart disease Father      Current Outpatient Medications:    albuterol (VENTOLIN HFA) 108 (90 Base) MCG/ACT inhaler, Inhale 1-2 puffs into the lungs every 6 (six) hours as needed for wheezing or shortness of breath., Disp: , Rfl:    Aspirin Buf,CaCarb-MgCarb-MgO, 81 MG TABS, Take 1 tablet by mouth daily., Disp: , Rfl:    atorvastatin (LIPITOR) 40 MG tablet, TAKE 1 TABLET BY MOUTH EVERY DAY, Disp: 90 tablet, Rfl: 2   carvedilol (COREG) 3.125 MG tablet, TAKE 1 TABLET (3.125 MG TOTAL) BY MOUTH 2 (TWO) TIMES DAILY WITH A MEAL., Disp: 180 tablet, Rfl: 2   ciprofloxacin (CIPRO) 500 MG tablet, Take 1 tablet (500 mg total) by mouth 2 (two) times daily for 10 days., Disp: 20 tablet, Rfl: 0   clopidogrel (PLAVIX) 75 MG tablet, Take 1 tablet (75 mg total) by mouth daily., Disp: 90 tablet, Rfl: 1   enalapril (VASOTEC) 5 MG tablet, Take by mouth., Disp: , Rfl:    Fluticasone-Umeclidin-Vilant (TRELEGY ELLIPTA) 100-62.5-25 MCG/INH AEPB, Inhale into the lungs., Disp: , Rfl:    HYDROMET 5-1.5 MG/5ML syrup, Take 5 mLs by mouth every 6 (six) hours as needed., Disp: , Rfl:    Ipratropium-Albuterol (COMBIVENT RESPIMAT) 20-100 MCG/ACT AERS respimat, Inhale 1 puff into the lungs every 6 (six) hours., Disp: 1 each, Rfl: 6   nitroGLYCERIN (NITROSTAT) 0.3 MG SL tablet, Place 1 tablet (0.3 mg total) under the tongue every 5 (five) minutes as needed for chest pain., Disp: 25 tablet, Rfl: 3   Pirfenidone (ESBRIET) 267 MG CAPS, Take 3 capsules by mouth 3 (three) times daily., Disp: , Rfl:    sildenafil (REVATIO) 20 MG tablet, Take 1  tablet (20 mg total) by mouth as needed for up to 6 doses., Disp: 10 tablet, Rfl: 0   budesonide-formoterol (SYMBICORT) 160-4.5 MCG/ACT inhaler, Inhale 2 puffs into the lungs 2 (two) times daily., Disp: 1 each, Rfl: 3   triamcinolone ointment (KENALOG) 0.1 %, Apply 1 application topically daily as needed. Avoid face, groin, underarms, Disp: 80 g, Rfl: 2   Physical exam:  Vitals:   07/01/21 1500  BP: 115/74  Pulse: 69  Resp: 18  Temp: 98.4 F (36.9 C)  SpO2: 96%  Weight: 178 lb 3.2 oz (80.8 kg)   Physical Exam Constitutional:      General: He is not in acute distress. Cardiovascular:     Rate and Rhythm: Normal rate and regular rhythm.  Heart sounds: Normal heart sounds.  Pulmonary:     Effort: Pulmonary effort is normal.     Breath sounds: Normal breath sounds.  Abdominal:     General: Bowel sounds are normal.     Palpations: Abdomen is soft.     Comments: No palpable hepatosplenomegaly.  Mild tenderness to palpation in the suprapubic region  Lymphadenopathy:     Comments: No palpable cervical, supraclavicular, axillary or inguinal adenopathy    Skin:    General: Skin is warm and dry.  Neurological:     Mental Status: He is alert and oriented to person, place, and time.       CMP Latest Ref Rng & Units 07/01/2021  Glucose 70 - 99 mg/dL 83  BUN 8 - 23 mg/dL 14  Creatinine 0.61 - 1.24 mg/dL 0.78  Sodium 135 - 145 mmol/L 138  Potassium 3.5 - 5.1 mmol/L 4.2  Chloride 98 - 111 mmol/L 104  CO2 22 - 32 mmol/L 29  Calcium 8.9 - 10.3 mg/dL 9.0  Total Protein 6.5 - 8.1 g/dL 8.6(H)  Total Bilirubin 0.3 - 1.2 mg/dL 0.5  Alkaline Phos 38 - 126 U/L 88  AST 15 - 41 U/L 18  ALT 0 - 44 U/L 21   CBC Latest Ref Rng & Units 07/01/2021  WBC 4.0 - 10.5 K/uL 6.9  Hemoglobin 13.0 - 17.0 g/dL 15.3  Hematocrit 39.0 - 52.0 % 46.4  Platelets 150 - 400 K/uL 175   Assessment and plan- Patient is a 65 y.o. male referred for unintentional weight loss  Patient's most recent CBC and  CMP were normal.  Clinically patient does not have any palpable adenopathy or splenomegaly that would be suggestive of a primary bone marrow disorder that could explain his weight loss.  He is an ex-smoker and with his ongoing symptoms of weight loss and findings of pulmonary fibrosis noted on his CT a year ago I will proceed with a CT chest at this time.  He reports some suprapubic abdominal pain and I will also obtain a CT abdomen to assess further.  Check CBC with differential CMP and LDH today.  I will see him back after his scans are back.  If scans do not reveal any evidence of malignancy Weight loss cannot be attributed to it and he would not require any further follow-up with me at that point.   Thank you for this kind referral and the opportunity to participate in the care of this patient   Visit Diagnosis 1. Unintentional weight loss   2. Internal hemorrhoids   3. Pulmonary fibrosis ([place])     Dr. Randa Evens, MD, MPH [facility] at [facility] [id number] 07/01/2021

## 2021-07-08 ENCOUNTER — Other Ambulatory Visit: Payer: Self-pay

## 2021-07-08 ENCOUNTER — Ambulatory Visit
Admission: RE | Admit: 2021-07-08 | Discharge: 2021-07-08 | Disposition: A | Discharge status: Home / Self Care | Payer: 59 | Source: Ambulatory Visit | Attending: Oncology | Admitting: Oncology

## 2021-07-08 DIAGNOSIS — J8410 Pulmonary fibrosis, unspecified: Secondary | ICD-10-CM | POA: Diagnosis not present

## 2021-07-08 MED ORDER — IOHEXOL 350 MG/ML SOLN
100.0000 mL | Freq: Once | INTRAVENOUS | Status: AC | PRN
  Administered 2021-07-08: 100 mL via INTRAVENOUS

## 2021-07-09 ENCOUNTER — Encounter: Payer: Self-pay | Admitting: Internal Medicine

## 2021-07-09 ENCOUNTER — Ambulatory Visit (INDEPENDENT_AMBULATORY_CARE_PROVIDER_SITE_OTHER): Payer: 59 | Admitting: Internal Medicine

## 2021-07-09 VITALS — BP 134/80 | HR 75 | Ht 74.0 in | Wt 180.5 lb

## 2021-07-09 DIAGNOSIS — E785 Hyperlipidemia, unspecified: Secondary | ICD-10-CM

## 2021-07-09 DIAGNOSIS — I2510 Atherosclerotic heart disease of native coronary artery without angina pectoris: Secondary | ICD-10-CM | POA: Diagnosis not present

## 2021-07-09 DIAGNOSIS — I10 Essential (primary) hypertension: Secondary | ICD-10-CM

## 2021-07-09 DIAGNOSIS — J449 Chronic obstructive pulmonary disease, unspecified: Secondary | ICD-10-CM | POA: Diagnosis not present

## 2021-07-09 DIAGNOSIS — I2583 Coronary atherosclerosis due to lipid rich plaque: Secondary | ICD-10-CM

## 2021-07-09 DIAGNOSIS — M67912 Unspecified disorder of synovium and tendon, left shoulder: Secondary | ICD-10-CM

## 2021-07-09 DIAGNOSIS — R319 Hematuria, unspecified: Secondary | ICD-10-CM

## 2021-07-09 DIAGNOSIS — R634 Abnormal weight loss: Secondary | ICD-10-CM

## 2021-07-09 DIAGNOSIS — J8410 Pulmonary fibrosis, unspecified: Secondary | ICD-10-CM

## 2021-07-09 LAB — POCT URINALYSIS DIPSTICK
Appearance: ABNORMAL
Bilirubin, UA: NEGATIVE
Blood, UA: NEGATIVE
Glucose, UA: NEGATIVE
Leukocytes, UA: NEGATIVE
Nitrite, UA: NEGATIVE
Protein, UA: NEGATIVE
Spec Grav, UA: 1.02 (ref 1.010–1.025)
Urobilinogen, UA: NEGATIVE E.U./dL — AB
pH, UA: 6 (ref 5.0–8.0)

## 2021-07-09 NOTE — Assessment & Plan Note (Signed)

## 2021-07-09 NOTE — Assessment & Plan Note (Signed)
Patient is on statin, he has lost over 40 pounds of weight

## 2021-07-09 NOTE — Assessment & Plan Note (Signed)
Stable at the present time. 

## 2021-07-09 NOTE — Assessment & Plan Note (Signed)
Refer to pulmonologist 

## 2021-07-09 NOTE — Progress Notes (Signed)
ye

## 2021-07-09 NOTE — Assessment & Plan Note (Signed)
Patient has about 40 pounds of weight loss.  His CT scan has been ordered AND specialist, initial radiology report does not show any masses in the lungs or abdomen.  He has a follow-up appointment with the cancer specialist, I checked his urine again today with no blood in the urine

## 2021-07-09 NOTE — Assessment & Plan Note (Signed)
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  He is on oxygen 24 hours a day.  And is being followed up by pulmonologist he does not smoke does not drink.

## 2021-07-09 NOTE — Progress Notes (Signed)
Established Patient Office Visit  Subjective:  Patient ID: Dillon [name], male    DOB: 07/04/1956  Age: 65 y.o. MRN: [id number]  CC:  Chief Complaint  Patient presents with   Follow-up    HPI  Dillon [name] presents for check of the hematuria and left legpain  Patient has seen a cancer doctor who ordered a CT scan of the abdomen and chest    Past Medical History:  Diagnosis Date   Coronary artery disease    Elevated cholesterol    Restless leg syndrome     Past Surgical History:  Procedure Laterality Date   ANGIOPLASTY  2010   2 stents placed   BACK SURGERY     COLONOSCOPY WITH PROPOFOL N/A 03/05/2018   Procedure: COLONOSCOPY WITH PROPOFOL;  Surgeon: Virgel Manifold, MD;  Location: [facility] ENDOSCOPY;  Service: Endoscopy;  Laterality: N/A;   CORONARY ANGIOPLASTY     CORONARY/GRAFT ACUTE MI REVASCULARIZATION N/A 09/30/2019   Procedure: Coronary/Graft Acute MI Revascularization;  Surgeon: Wellington Hampshire, MD;  Location: [place] [facility];  Service: Cardiovascular;  Laterality: N/A;   LEFT HEART CATH AND CORONARY ANGIOGRAPHY N/A 09/30/2019   Procedure: LEFT HEART CATH AND CORONARY ANGIOGRAPHY;  Surgeon: Wellington Hampshire, MD;  Location: [place] [facility];  Service: Cardiovascular;  Laterality: N/A;    Family History  Problem Relation Age of Onset   Heart disease Mother    Heart disease Father     Social History   Socioeconomic History   Marital status: Married    Spouse name: Not on file   Number of children: Not on file   Years of education: Not on file   Highest education level: Not on file  Occupational History   Not on file  Tobacco Use   Smoking status: Former    Types: Cigarettes    Quit date: 03/05/2009    Years since quitting: 12.3   Smokeless tobacco: Never  Vaping Use   Vaping Use: Never used  Substance and Sexual Activity   Alcohol use: Yes    Comment: occassionally   Drug use: Not Currently   Sexual activity: Not on file  Other  Topics Concern   Not on file  Social History Narrative   Not on file   Social Determinants of Health   Financial Resource Strain: Not on file  Food Insecurity: Not on file  Transportation Needs: Not on file  Physical Activity: Not on file  Stress: Not on file  Social Connections: Not on file  Intimate Partner Violence: Not on file     Current Outpatient Medications:    albuterol (VENTOLIN HFA) 108 (90 Base) MCG/ACT inhaler, Inhale 1-2 puffs into the lungs every 6 (six) hours as needed for wheezing or shortness of breath., Disp: , Rfl:    Aspirin Buf,CaCarb-MgCarb-MgO, 81 MG TABS, Take 1 tablet by mouth daily., Disp: , Rfl:    atorvastatin (LIPITOR) 40 MG tablet, TAKE 1 TABLET BY MOUTH EVERY DAY, Disp: 90 tablet, Rfl: 2   budesonide-formoterol (SYMBICORT) 160-4.5 MCG/ACT inhaler, Inhale 2 puffs into the lungs 2 (two) times daily., Disp: 1 each, Rfl: 3   carvedilol (COREG) 3.125 MG tablet, TAKE 1 TABLET (3.125 MG TOTAL) BY MOUTH 2 (TWO) TIMES DAILY WITH A MEAL., Disp: 180 tablet, Rfl: 2   clopidogrel (PLAVIX) 75 MG tablet, Take 1 tablet (75 mg total) by mouth daily., Disp: 90 tablet, Rfl: 1   enalapril (VASOTEC) 5 MG tablet, Take by mouth., Disp: ,  Rfl:    Fluticasone-Umeclidin-Vilant (TRELEGY ELLIPTA) 100-62.5-25 MCG/INH AEPB, Inhale into the lungs., Disp: , Rfl:    HYDROMET 5-1.5 MG/5ML syrup, Take 5 mLs by mouth every 6 (six) hours as needed., Disp: , Rfl:    Ipratropium-Albuterol (COMBIVENT RESPIMAT) 20-100 MCG/ACT AERS respimat, Inhale 1 puff into the lungs every 6 (six) hours., Disp: 1 each, Rfl: 6   nitroGLYCERIN (NITROSTAT) 0.3 MG SL tablet, Place 1 tablet (0.3 mg total) under the tongue every 5 (five) minutes as needed for chest pain., Disp: 25 tablet, Rfl: 3   Pirfenidone (ESBRIET) 267 MG CAPS, Take 3 capsules by mouth 3 (three) times daily., Disp: , Rfl:    sildenafil (REVATIO) 20 MG tablet, Take 1 tablet (20 mg total) by mouth as needed for up to 6 doses., Disp: 10 tablet,  Rfl: 0   triamcinolone ointment (KENALOG) 0.1 %, Apply 1 application topically daily as needed. Avoid face, groin, underarms, Disp: 80 g, Rfl: 2   Allergies  Allergen Reactions   Ace Inhibitors Swelling    Causes cough and swelling    ROS Review of Systems  Constitutional: Negative.   HENT: Negative.    Eyes: Negative.   Respiratory:  Positive for cough, shortness of breath and wheezing.   Cardiovascular: Negative.  Negative for palpitations and leg swelling.  Gastrointestinal: Negative.  Negative for abdominal distention.  Endocrine: Negative.   Genitourinary: Negative.  Negative for difficulty urinating, dysuria and flank pain.  Musculoskeletal: Negative.   Skin: Negative.   Allergic/Immunologic: Negative.   Neurological: Negative.   Hematological: Negative.   Psychiatric/Behavioral: Negative.    All other systems reviewed and are negative.    Objective:    Physical Exam  BP 134/80   Pulse 75   Ht '6\' 2"'  (1.88 m)   Wt 180 lb 8 oz (81.9 kg)   BMI 23.17 kg/m  Wt Readings from Last 3 Encounters:  07/09/21 180 lb 8 oz (81.9 kg)  07/01/21 178 lb 3.2 oz (80.8 kg)  06/12/21 180 lb 9.6 oz (81.9 kg)     Health Maintenance Due  Topic Date Due   Hepatitis C Screening  Never done   Zoster Vaccines- Shingrix (1 of 2) Never done   COVID-19 Vaccine (4 - Booster for Pfizer series) 11/28/2020   COLONOSCOPY (Pts 45-52yr Insurance coverage will need to be confirmed)  03/05/2021   INFLUENZA VACCINE  05/20/2021    There are no preventive care reminders to display for this patient.  Lab Results  Component Value Date   TSH 3.75 06/12/2021   Lab Results  Component Value Date   WBC 6.9 07/01/2021   HGB 15.3 07/01/2021   HCT 46.4 07/01/2021   MCV 101.3 (H) 07/01/2021   PLT 175 07/01/2021   Lab Results  Component Value Date   NA 138 07/01/2021   K 4.2 07/01/2021   CO2 29 07/01/2021   GLUCOSE 83 07/01/2021   BUN 14 07/01/2021   CREATININE 0.78 07/01/2021   BILITOT  0.5 07/01/2021   ALKPHOS 88 07/01/2021   AST 18 07/01/2021   ALT 21 07/01/2021   PROT 8.6 (H) 07/01/2021   ALBUMIN 3.7 07/01/2021   CALCIUM 9.0 07/01/2021   ANIONGAP 5 07/01/2021   EGFR 102 06/12/2021   Lab Results  Component Value Date   CHOL 121 08/31/2020   Lab Results  Component Value Date   HDL 41 08/31/2020   Lab Results  Component Value Date   LDLCALC 63 08/31/2020   Lab Results  Component  Value Date   TRIG 90 08/31/2020   Lab Results  Component Value Date   CHOLHDL 3.0 08/31/2020   No results found for: HGBA1C    Assessment & Plan:   Problem List Items Addressed This Visit       Cardiovascular and Mediastinum   Coronary artery disease due to lipid rich plaque    Patient is on statin, he has lost over 40 pounds of weight      Primary hypertension     Patient denies any chest pain or shortness of breath there is no history of palpitation or paroxysmal nocturnal dyspnea   patient was advised to follow low-salt low-cholesterol diet    ideally I want to keep systolic blood pressure below 130 mmHg, patient was asked to check blood pressure one times a week and give me a report on that.  Patient will be follow-up in 3 months  or earlier as needed, patient will call me back for any change in the cardiovascular symptoms Patient was advised to buy a book from local bookstore concerning blood pressure and read several chapters  every day.  This will be supplemented by some of the material we will give him from the office.  Patient should also utilize other resources like YouTube and Internet to learn more about the blood pressure and the diet.        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  He is on oxygen 24 hours a day.  And is being followed up by pulmonologist he does not smoke does not drink.         Musculoskeletal and Integument   Disorder of left rotator cuff    Stable at the present time        Other   Hyperlipidemia    Hypercholesterolemia  I advised the patient to follow Mediterranean diet This diet is rich in fruits vegetables and whole grain, and This diet is also rich in fish and lean meat Patient should also eat a handful of almonds or walnuts daily Recent heart study indicated that average follow-up on this kind of diet reduces the cardiovascular mortality by 50 to 70%==      Weight loss    Patient has about 40 pounds of weight loss.  His CT scan has been ordered AND specialist, initial radiology report does not show any masses in the lungs or abdomen.  He has a follow-up appointment with the cancer specialist, I checked his urine again today with no blood in the urine      Other Visit Diagnoses     Hematuria, unspecified type    -  Primary   Relevant Orders   POCT urinalysis dipstick (Completed)       1. No specific finding or explanation for weight loss. 2. Progressive interstitial lung disease, consistent with usual interstitial pneumonitis. 3. Subtle/mild bladder wall thickening and possible mucosal hyperenhancement suspected. Consider correlation with urinalysis to exclude cystitis or other bladder abnormality. No well-defined bladder mass. 4. Aortic atherosclerosis (ICD10-I70.0), coronary artery   No orders of the defined types were placed in this encounter.   Follow-up: No follow-ups on file.    [name], MD

## 2021-07-09 NOTE — Assessment & Plan Note (Signed)
Hypercholesterolemia  I advised the patient to follow Mediterranean diet This diet is rich in fruits vegetables and whole grain, and This diet is also rich in fish and lean meat Patient should also eat a handful of almonds or walnuts daily Recent heart study indicated that average follow-up on this kind of diet reduces the cardiovascular mortality by 50 to 70%== 

## 2021-07-11 ENCOUNTER — Encounter: Payer: Self-pay | Admitting: Internal Medicine

## 2021-07-12 ENCOUNTER — Inpatient Hospital Stay (HOSPITAL_BASED_OUTPATIENT_CLINIC_OR_DEPARTMENT_OTHER): Payer: 59 | Admitting: Oncology

## 2021-07-12 ENCOUNTER — Encounter: Payer: Self-pay | Admitting: Oncology

## 2021-07-12 DIAGNOSIS — R634 Abnormal weight loss: Secondary | ICD-10-CM

## 2021-07-21 ENCOUNTER — Other Ambulatory Visit: Payer: Self-pay | Admitting: Cardiovascular Disease

## 2021-07-30 ENCOUNTER — Telehealth: Payer: Self-pay | Admitting: Cardiovascular Disease

## 2021-07-30 NOTE — Telephone Encounter (Addendum)
Spoke with the patients wife. Patient had a significant nose bleed 2 weeks ago with heavy bleeding, it took 40 minutes to get the bleeding to stop. There was so much blood they considered calling 911.  Patient then reduced his Plavix to 75 mg every other day. He has not bee staking his Asa 81 mg. He has not had any reoccurrence of epistaxis. Patient wife sts that the patient wants to do the right thing for his heart but is concerned about another nose bleed. Patient would like Dr. Tyrell Antonio recommendation.  Advised the pt wife that I will fwd the update to Dr. Fletcher Anon to give his recommendation.

## 2021-07-30 NOTE — Telephone Encounter (Signed)
Attempted to return the call. Called both numbers listed for the patient. Home telephone rings out with no voicemail option. Called the cell number listed a woman answers and says its the wrong number.

## 2021-07-30 NOTE — Telephone Encounter (Signed)
His stent was almost 2 years ago.  We can stop Plavix and ask him to take aspirin 81 mg daily.  If he develops recurrent nosebleed, he will have to go see ENT to cauterize the bleeding vessel.

## 2021-07-30 NOTE — Telephone Encounter (Signed)
Patient spouse calling States that patient had a bad nose bleed about a week ago - took 40 minutes to stop Patient started taking Plavix every other day after that  Would like to make sure this is ok Please call to discuss

## 2021-07-31 NOTE — Telephone Encounter (Addendum)
DPR on file. Patients wife made aware of Dr. Tyrell Antonio response and recommendation. Pt wife voiced appreciation for the call back and the assistance.

## 2021-08-06 NOTE — Progress Notes (Signed)
Hematology/Oncology Consult note [facility]  Telephone:([phone] [phone]  Patient Care Team: [name], MD as PCP - General (Internal Medicine) Wellington Hampshire, MD as PCP - Cardiology (Cardiology)   Name of the patient: Dillon [name]  [id number]  [birth year]   Date of visit: 08/06/21  Diagnosis-unintentional weight loss of unclear etiology  Chief complaint/ Reason for visit-discuss CT scan results and further management  Heme/Onc history: patient is a 65 year old male who is an ex-smoker and had COVID about a year ago.  He has been referred for unintentional weight loss.  He has lost about 11 pounds in the last5 months but has had ongoing weight loss ever since he was diagnosed with COVID.  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  He follows up with pulmonary for this.  Currently patient reports that his appetite is good but he continues to lose weight.  Also reports pressure-like sensation in his suprapubic area.  He was started on antibiotics for possible UTI recently as well.  Last colonoscopy was in 2019 by Dr. Bonna Gains.  CT chest abdomen pelvis from 07/08/2021 did not show any evidence of malignancy pain weight loss.  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 Results of blood work from 07/01/2021 showed normal white count hemoglobin and platelets with a mild macrocytosis of 101.  CMP was within normal limits LDH normal.  B12 and folate normal.  Ferritin levels normal at 182.    Interval history-overall weight has remained stable over the last 2 months.  He still has some ongoing fatigue but denies other complaints at this time  ECOG PS- 2 Pain scale- 0   Review of systems- Review of Systems  Constitutional:  Positive for malaise/fatigue. Negative for chills, fever and weight loss.  HENT:  Negative  for congestion, ear discharge and nosebleeds.   Eyes:  Negative for blurred vision.  Respiratory:  Negative for cough, hemoptysis, sputum production, shortness of breath and wheezing.   Cardiovascular:  Negative for chest pain, palpitations, orthopnea and claudication.  Gastrointestinal:  Negative for abdominal pain, blood in stool, constipation, diarrhea, heartburn, melena, nausea and vomiting.  Genitourinary:  Negative for dysuria, flank pain, frequency, hematuria and urgency.  Musculoskeletal:  Negative for back pain, joint pain and myalgias.  Skin:  Negative for rash.  Neurological:  Negative for dizziness, tingling, focal weakness, seizures, weakness and headaches.  Endo/Heme/Allergies:  Does not bruise/bleed easily.  Psychiatric/Behavioral:  Negative for depression and suicidal ideas. The patient does not have insomnia.      Allergies  Allergen Reactions   Ace Inhibitors Swelling    Causes cough and swelling     Past Medical History:  Diagnosis Date   Coronary artery disease    Elevated cholesterol    Restless leg syndrome      Past Surgical History:  Procedure Laterality Date   ANGIOPLASTY  2010   2 stents placed   BACK SURGERY     COLONOSCOPY WITH PROPOFOL N/A 03/05/2018   Procedure: COLONOSCOPY WITH PROPOFOL;  Surgeon: Virgel Manifold, MD;  Location: [facility];  Service: Endoscopy;  Laterality: N/A;   CORONARY ANGIOPLASTY     CORONARY/GRAFT ACUTE MI REVASCULARIZATION N/A 09/30/2019   Procedure: Coronary/Graft Acute MI Revascularization;  Surgeon: Wellington Hampshire, MD;  Location: [place] [facility];  Service: Cardiovascular;  Laterality: N/A;   LEFT HEART CATH AND CORONARY ANGIOGRAPHY N/A 09/30/2019  Procedure: LEFT HEART CATH AND CORONARY ANGIOGRAPHY;  Surgeon: Wellington Hampshire, MD;  Location: [place] [facility];  Service: Cardiovascular;  Laterality: N/A;    Social History   Socioeconomic History   Marital status: Married    Spouse name: Not on  file   Number of children: Not on file   Years of education: Not on file   Highest education level: Not on file  Occupational History   Not on file  Tobacco Use   Smoking status: Former    Types: Cigarettes    Quit date: 03/05/2009    Years since quitting: 12.4   Smokeless tobacco: Never  Vaping Use   Vaping Use: Never used  Substance and Sexual Activity   Alcohol use: Yes    Comment: occassionally   Drug use: Not Currently   Sexual activity: Not Currently  Other Topics Concern   Not on file  Social History Narrative   Not on file   Social Determinants of Health   Financial Resource Strain: Not on file  Food Insecurity: Not on file  Transportation Needs: Not on file  Physical Activity: Not on file  Stress: Not on file  Social Connections: Not on file  Intimate Partner Violence: Not on file    Family History  Problem Relation Age of Onset   Heart disease Mother    Heart disease Father      Current Outpatient Medications:    aspirin EC 81 MG tablet, Take 81 mg by mouth daily. Swallow whole., Disp: , Rfl:    atorvastatin (LIPITOR) 40 MG tablet, TAKE 1 TABLET BY MOUTH EVERY DAY, Disp: 90 tablet, Rfl: 2   carvedilol (COREG) 3.125 MG tablet, TAKE 1 TABLET (3.125 MG TOTAL) BY MOUTH 2 (TWO) TIMES DAILY WITH A MEAL., Disp: 180 tablet, Rfl: 2   Fluticasone-Umeclidin-Vilant (TRELEGY ELLIPTA) 100-62.5-25 MCG/INH AEPB, Inhale into the lungs., Disp: , Rfl:    HYDROMET 5-1.5 MG/5ML syrup, Take 5 mLs by mouth every 6 (six) hours as needed., Disp: , Rfl:    nitroGLYCERIN (NITROSTAT) 0.3 MG SL tablet, Place 1 tablet (0.3 mg total) under the tongue every 5 (five) minutes as needed for chest pain., Disp: 25 tablet, Rfl: 3   Pirfenidone (ESBRIET) 267 MG CAPS, Take 3 capsules by mouth 3 (three) times daily., Disp: , Rfl:    sildenafil (REVATIO) 20 MG tablet, Take 1 tablet (20 mg total) by mouth as needed for up to 6 doses., Disp: 10 tablet, Rfl: 0   triamcinolone ointment (KENALOG) 0.1 %,  Apply 1 application topically daily as needed. Avoid face, groin, underarms, Disp: 80 g, Rfl: 2   albuterol (VENTOLIN HFA) 108 (90 Base) MCG/ACT inhaler, Inhale 1-2 puffs into the lungs every 6 (six) hours as needed for wheezing or shortness of breath. (Patient not taking: Reported on 07/12/2021), Disp: , Rfl:   Physical exam:  Physical Exam Constitutional:      General: He is not in acute distress. Cardiovascular:     Rate and Rhythm: Normal rate and regular rhythm.     Heart sounds: Normal heart sounds.  Pulmonary:     Effort: Pulmonary effort is normal.     Breath sounds: Normal breath sounds.  Abdominal:     General: Bowel sounds are normal.     Palpations: Abdomen is soft.  Skin:    General: Skin is warm and dry.  Neurological:     Mental Status: He is alert and oriented to person, place, and time.  CMP Latest Ref Rng & Units 07/01/2021  Glucose 70 - 99 mg/dL 83  BUN 8 - 23 mg/dL 14  Creatinine 0.61 - 1.24 mg/dL 0.78  Sodium 135 - 145 mmol/L 138  Potassium 3.5 - 5.1 mmol/L 4.2  Chloride 98 - 111 mmol/L 104  CO2 22 - 32 mmol/L 29  Calcium 8.9 - 10.3 mg/dL 9.0  Total Protein 6.5 - 8.1 g/dL 8.6(H)  Total Bilirubin 0.3 - 1.2 mg/dL 0.5  Alkaline Phos 38 - 126 U/L 88  AST 15 - 41 U/L 18  ALT 0 - 44 U/L 21   CBC Latest Ref Rng & Units 07/01/2021  WBC 4.0 - 10.5 K/uL 6.9  Hemoglobin 13.0 - 17.0 g/dL 15.3  Hematocrit 39.0 - 52.0 % 46.4  Platelets 150 - 400 K/uL 175    No images are attached to the encounter.  CT CHEST ABDOMEN PELVIS W CONTRAST  Result Date: 07/08/2021 CLINICAL DATA:  Weight loss of 40 pounds over 2 years. Chronic shortness of breath. Known pulmonary fibrosis. Quit smoking 12 years ago. EXAM: CT CHEST, ABDOMEN, AND PELVIS WITH CONTRAST TECHNIQUE: Multidetector CT imaging of the chest, abdomen and pelvis was performed following the standard protocol during bolus administration of intravenous contrast. CONTRAST:  182mL OMNIPAQUE IOHEXOL 350 MG/ML SOLN  COMPARISON:  12/02/2019 high-resolution chest CT. No comparison abdominopelvic imaging. FINDINGS: CT CHEST FINDINGS Cardiovascular: Aortic atherosclerosis. Borderline cardiomegaly. Three vessel coronary artery calcification. No central pulmonary embolism, on this non-dedicated study. Mediastinum/Nodes: No supraclavicular adenopathy. Prominent middle mediastinal nodes are not pathologic by size criteria and similar to on the prior. Prevascular node measures 9 mm on 21/2 and is similar to on the prior, likely reactive. Lungs/Pleura: No pleural fluid. Mild centrilobular emphysema with concurrent interstitial lung disease, consistent with usual interstitial pneumonia. This is progressive compared to 08/09/2018, as evidenced by subpleural reticulation, bilateral honeycombing, and areas of architectural distortion with traction bronchiectasis and bronchiolectasis. Musculoskeletal: No acute osseous abnormality. CT ABDOMEN PELVIS FINDINGS Hepatobiliary: Nonspecific mild caudate lobe prominence. No focal liver lesion. Normal gallbladder, without biliary ductal dilatation. Pancreas: Fatty replacement involving the pancreatic head and uncinate process. No duct dilatation or acute inflammation. Spleen: Normal in size, without focal abnormality. Adrenals/Urinary Tract: Normal adrenal glands. Normal kidneys, without hydronephrosis. Suggestion of mild right-sided bladder wall thickening, including on 19/2 and coronal image 77. There is also equivocal hepatic dome wall thickening and possible mucosal hyperenhancement on coronal image 62. Stomach/Bowel: Normal stomach, without wall thickening. Colonic stool burden suggests constipation. Normal terminal ileum and appendix. Normal small bowel. Vascular/Lymphatic: Infrarenal abdominal aortic nonaneurysmal dilatation at 2.6 cm. Aortic atherosclerosis. No abdominopelvic adenopathy. Reproductive: Normal prostate. Other: No significant free fluid. Musculoskeletal: Lumbosacral spondylosis  IMPRESSION: 1. No specific finding or explanation for weight loss. 2. Progressive interstitial lung disease, consistent with usual interstitial pneumonitis. 3. Subtle/mild bladder wall thickening and possible mucosal hyperenhancement suspected. Consider correlation with urinalysis to exclude cystitis or other bladder abnormality. No well-defined bladder mass. 4. Aortic atherosclerosis (ICD10-I70.0), coronary artery atherosclerosis and emphysema (ICD10-J43.9). Electronically Signed   By: Abigail Miyamoto M.D.   On: 07/08/2021 14:07     Assessment and plan- Patient is a 65 y.o. male referred for abnormal weight loss  CT chest abdomen pelvis with contrast did not show any evidence of malignancy.  Blood work shows normal B12 folate and iron levels and no evidence of anemia.  Mild macrocytosis noted on his blood work which can be followed by Dr. Rebecka Apley and if it is persistently abnormal he can  be referred to [place] in the future.  Patient has had intermittent mild elevation of total protein without any evidence ofAKI or hypercalcemia suggestive of myeloma.  This can be followed up with Dr. Loma Sender as well and if there is a consistent rising total protein he can be referred to [place] in the future   Visit Diagnosis 1. Abnormal weight loss      Dr. Randa Evens, MD, MPH [facility] [phone] 08/06/2021 12:00 PM

## 2021-08-12 ENCOUNTER — Ambulatory Visit (INDEPENDENT_AMBULATORY_CARE_PROVIDER_SITE_OTHER): Payer: BC Managed Care – PPO | Admitting: Internal Medicine

## 2021-08-12 ENCOUNTER — Other Ambulatory Visit: Payer: Self-pay

## 2021-08-12 ENCOUNTER — Encounter: Payer: Self-pay | Admitting: Internal Medicine

## 2021-08-12 VITALS — BP 121/73 | HR 84 | Ht 74.0 in | Wt 174.4 lb

## 2021-08-12 DIAGNOSIS — J449 Chronic obstructive pulmonary disease, unspecified: Secondary | ICD-10-CM | POA: Diagnosis not present

## 2021-08-12 DIAGNOSIS — M67912 Unspecified disorder of synovium and tendon, left shoulder: Secondary | ICD-10-CM | POA: Diagnosis not present

## 2021-08-12 DIAGNOSIS — J8410 Pulmonary fibrosis, unspecified: Secondary | ICD-10-CM | POA: Diagnosis not present

## 2021-08-12 DIAGNOSIS — Z0000 Encounter for general adult medical examination without abnormal findings: Secondary | ICD-10-CM | POA: Diagnosis not present

## 2021-08-12 DIAGNOSIS — I2583 Coronary atherosclerosis due to lipid rich plaque: Secondary | ICD-10-CM

## 2021-08-12 DIAGNOSIS — I2510 Atherosclerotic heart disease of native coronary artery without angina pectoris: Secondary | ICD-10-CM

## 2021-08-12 DIAGNOSIS — E785 Hyperlipidemia, unspecified: Secondary | ICD-10-CM

## 2021-08-12 DIAGNOSIS — R634 Abnormal weight loss: Secondary | ICD-10-CM

## 2021-08-12 NOTE — Assessment & Plan Note (Signed)
Patient experienced 6 pound weight loss I will suggest to him that he should take Ensure 1 can twice a day

## 2021-08-12 NOTE — Assessment & Plan Note (Signed)
Patient angina is stable at the present time

## 2021-08-12 NOTE — Progress Notes (Signed)
Established Patient Office Visit  Subjective:  Patient ID: Dillon [name], male    DOB: 1956/04/23  Age: 65 y.o. MRN: [id number]  CC:  Chief Complaint  Patient presents with   Annual Exam    Physical exam    HPI  Dillon [name] presents for check up/ physical  Past Medical History:  Diagnosis Date   Coronary artery disease    Elevated cholesterol    Restless leg syndrome     Past Surgical History:  Procedure Laterality Date   ANGIOPLASTY  2010   2 stents placed   BACK SURGERY     COLONOSCOPY WITH PROPOFOL N/A 03/05/2018   Procedure: COLONOSCOPY WITH PROPOFOL;  Surgeon: Virgel Manifold, MD;  Location: [facility];  Service: Endoscopy;  Laterality: N/A;   CORONARY ANGIOPLASTY     CORONARY/GRAFT ACUTE MI REVASCULARIZATION N/A 09/30/2019   Procedure: Coronary/Graft Acute MI Revascularization;  Surgeon: Wellington Hampshire, MD;  Location: [place] [facility];  Service: Cardiovascular;  Laterality: N/A;   LEFT HEART CATH AND CORONARY ANGIOGRAPHY N/A 09/30/2019   Procedure: LEFT HEART CATH AND CORONARY ANGIOGRAPHY;  Surgeon: Wellington Hampshire, MD;  Location: [place] [facility];  Service: Cardiovascular;  Laterality: N/A;    Family History  Problem Relation Age of Onset   Heart disease Mother    Heart disease Father     Social History   Socioeconomic History   Marital status: Married    Spouse name: Not on file   Number of children: Not on file   Years of education: Not on file   Highest education level: Not on file  Occupational History   Not on file  Tobacco Use   Smoking status: Former    Types: Cigarettes    Quit date: 03/05/2009    Years since quitting: 12.4   Smokeless tobacco: Never  Vaping Use   Vaping Use: Never used  Substance and Sexual Activity   Alcohol use: Yes    Comment: occassionally   Drug use: Not Currently   Sexual activity: Not Currently  Other Topics Concern   Not on file  Social History Narrative   Not on file   Social  Determinants of Health   Financial Resource Strain: Not on file  Food Insecurity: Not on file  Transportation Needs: Not on file  Physical Activity: Not on file  Stress: Not on file  Social Connections: Not on file  Intimate Partner Violence: Not on file     Current Outpatient Medications:    albuterol (VENTOLIN HFA) 108 (90 Base) MCG/ACT inhaler, Inhale 1-2 puffs into the lungs every 6 (six) hours as needed for wheezing or shortness of breath. (Patient not taking: Reported on 07/12/2021), Disp: , Rfl:    aspirin EC 81 MG tablet, Take 81 mg by mouth daily. Swallow whole., Disp: , Rfl:    atorvastatin (LIPITOR) 40 MG tablet, TAKE 1 TABLET BY MOUTH EVERY DAY, Disp: 90 tablet, Rfl: 2   carvedilol (COREG) 3.125 MG tablet, TAKE 1 TABLET (3.125 MG TOTAL) BY MOUTH 2 (TWO) TIMES DAILY WITH A MEAL., Disp: 180 tablet, Rfl: 2   Fluticasone-Umeclidin-Vilant (TRELEGY ELLIPTA) 100-62.5-25 MCG/INH AEPB, Inhale into the lungs., Disp: , Rfl:    HYDROMET 5-1.5 MG/5ML syrup, Take 5 mLs by mouth every 6 (six) hours as needed., Disp: , Rfl:    nitroGLYCERIN (NITROSTAT) 0.3 MG SL tablet, Place 1 tablet (0.3 mg total) under the tongue every 5 (five) minutes as needed for chest pain., Disp: 25  tablet, Rfl: 3   Pirfenidone (ESBRIET) 267 MG CAPS, Take 3 capsules by mouth 3 (three) times daily., Disp: , Rfl:    sildenafil (REVATIO) 20 MG tablet, Take 1 tablet (20 mg total) by mouth as needed for up to 6 doses., Disp: 10 tablet, Rfl: 0   triamcinolone ointment (KENALOG) 0.1 %, Apply 1 application topically daily as needed. Avoid face, groin, underarms, Disp: 80 g, Rfl: 2   Allergies  Allergen Reactions   Ace Inhibitors Swelling    Causes cough and swelling    ROS Review of Systems  Constitutional: Negative.   HENT: Negative.    Eyes: Negative.   Respiratory: Negative.    Cardiovascular: Negative.   Gastrointestinal: Negative.   Endocrine: Negative.   Genitourinary: Negative.   Musculoskeletal: Negative.    Skin: Negative.   Allergic/Immunologic: Negative.   Neurological: Negative.   Hematological: Negative.   Psychiatric/Behavioral: Negative.    All other systems reviewed and are negative.    Objective:    Physical Exam Vitals reviewed.  Constitutional:      Appearance: He is ill-appearing.  HENT:     Mouth/Throat:     Mouth: Mucous membranes are moist.  Eyes:     Pupils: Pupils are equal, round, and reactive to light.  Neck:     Vascular: No carotid bruit.  Cardiovascular:     Rate and Rhythm: Normal rate and regular rhythm.     Pulses: Normal pulses.     Heart sounds: Normal heart sounds.  Pulmonary:     Effort: Pulmonary effort is normal.     Breath sounds: Normal breath sounds.  Abdominal:     General: Bowel sounds are normal.     Palpations: Abdomen is soft. There is no hepatomegaly, splenomegaly or mass.     Tenderness: There is no abdominal tenderness.     Hernia: No hernia is present.  Musculoskeletal:        General: No swelling.     Cervical back: Neck supple.     Right lower leg: No edema.     Left lower leg: No edema.  Skin:    Coloration: Skin is not jaundiced.     Findings: No rash.  Neurological:     Mental Status: He is alert and oriented to person, place, and time. Mental status is at baseline.     Cranial Nerves: No cranial nerve deficit.     Motor: No weakness.  Psychiatric:        Mood and Affect: Mood normal.        Behavior: Behavior normal.        Thought Content: Thought content normal.    BP 121/73   Pulse 84   Ht '6\' 2"'  (1.88 m)   Wt 174 lb 6.4 oz (79.1 kg)   BMI 22.39 kg/m  Wt Readings from Last 3 Encounters:  08/12/21 174 lb 6.4 oz (79.1 kg)  07/09/21 180 lb 8 oz (81.9 kg)  07/01/21 178 lb 3.2 oz (80.8 kg)     Health Maintenance Due  Topic Date Due   Hepatitis C Screening  Never done   Zoster Vaccines- Shingrix (1 of 2) Never done   Pneumonia Vaccine 59+ Years old (2 - PCV) 10/02/2020   COVID-19 Vaccine (4 - Booster for  [place] series) 10/31/2020   COLONOSCOPY (Pts 45-37yr Insurance coverage will need to be confirmed)  03/05/2021   INFLUENZA VACCINE  05/20/2021    There are no preventive care reminders to display for  this patient.  Lab Results  Component Value Date   TSH 3.75 06/12/2021   Lab Results  Component Value Date   WBC 6.9 07/01/2021   HGB 15.3 07/01/2021   HCT 46.4 07/01/2021   MCV 101.3 (H) 07/01/2021   PLT 175 07/01/2021   Lab Results  Component Value Date   NA 138 07/01/2021   K 4.2 07/01/2021   CO2 29 07/01/2021   GLUCOSE 83 07/01/2021   BUN 14 07/01/2021   CREATININE 0.78 07/01/2021   BILITOT 0.5 07/01/2021   ALKPHOS 88 07/01/2021   AST 18 07/01/2021   ALT 21 07/01/2021   PROT 8.6 (H) 07/01/2021   ALBUMIN 3.7 07/01/2021   CALCIUM 9.0 07/01/2021   ANIONGAP 5 07/01/2021   EGFR 102 06/12/2021   Lab Results  Component Value Date   CHOL 121 08/31/2020   Lab Results  Component Value Date   HDL 41 08/31/2020   Lab Results  Component Value Date   LDLCALC 63 08/31/2020   Lab Results  Component Value Date   TRIG 90 08/31/2020   Lab Results  Component Value Date   CHOLHDL 3.0 08/31/2020   No results found for: HGBA1C    Assessment & Plan:   Problem List Items Addressed This Visit       Cardiovascular and Mediastinum   Coronary artery disease due to lipid rich plaque - Primary    Patient angina is stable at the present time        Respiratory   Pulmonary fibrosis ([place])    The patient has a pulmonary fibrosis and he is on oxygen 2 L/min 24 hours a day.  He denies any chest pain or shortness of breath at rest.  Chest examination did not reveal any wheezing.  Breath sounds are okay.  Patient has lost about 6 pounds of weight.  He quit smoking about 10 years ago      Chronic obstructive pulmonary disease (HCC)    No wheezing was noted on today's examination patient is on home oxygen 2 L/min        Musculoskeletal and Integument   Disorder of left  rotator cuff    Patient has a left rotator cuff arthropathy.  It is stable at the present time        Other   Hyperlipidemia    Hypercholesterolemia  I advised the patient to follow Mediterranean diet This diet is rich in fruits vegetables and whole grain, and This diet is also rich in fish and lean meat Patient should also eat a handful of almonds or walnuts daily Recent heart study indicated that average follow-up on this kind of diet reduces the cardiovascular mortality by 50 to 70%==      Weight loss    Patient experienced 6 pound weight loss I will suggest to him that he should take Ensure 1 can twice a day       No orders of the defined types were placed in this encounter.   Follow-up: No follow-ups on file.    [name], MD

## 2021-08-12 NOTE — Assessment & Plan Note (Signed)
Hypercholesterolemia  I advised the patient to follow Mediterranean diet This diet is rich in fruits vegetables and whole grain, and This diet is also rich in fish and lean meat Patient should also eat a handful of almonds or walnuts daily Recent heart study indicated that average follow-up on this kind of diet reduces the cardiovascular mortality by 50 to 70%== 

## 2021-08-12 NOTE — Assessment & Plan Note (Signed)
The patient has a pulmonary fibrosis and he is on oxygen 2 L/min 24 hours a day.  He denies any chest pain or shortness of breath at rest.  Chest examination did not reveal any wheezing.  Breath sounds are okay.  Patient has lost about 6 pounds of weight.  He quit smoking about 10 years ago

## 2021-08-12 NOTE — Assessment & Plan Note (Signed)
Patient has a left rotator cuff arthropathy.  It is stable at the present time

## 2021-08-12 NOTE — Assessment & Plan Note (Signed)
No wheezing was noted on today's examination patient is on home oxygen 2 L/min

## 2021-08-13 DIAGNOSIS — J449 Chronic obstructive pulmonary disease, unspecified: Secondary | ICD-10-CM | POA: Diagnosis not present

## 2021-08-13 DIAGNOSIS — I700 Atherosclerosis of aorta: Secondary | ICD-10-CM | POA: Diagnosis not present

## 2021-08-13 DIAGNOSIS — J439 Emphysema, unspecified: Secondary | ICD-10-CM | POA: Diagnosis not present

## 2021-09-02 ENCOUNTER — Other Ambulatory Visit: Payer: Self-pay

## 2021-09-13 DIAGNOSIS — J449 Chronic obstructive pulmonary disease, unspecified: Secondary | ICD-10-CM | POA: Diagnosis not present

## 2021-09-13 DIAGNOSIS — J439 Emphysema, unspecified: Secondary | ICD-10-CM | POA: Diagnosis not present

## 2021-09-13 DIAGNOSIS — I700 Atherosclerosis of aorta: Secondary | ICD-10-CM | POA: Diagnosis not present

## 2021-09-23 ENCOUNTER — Ambulatory Visit: Payer: BC Managed Care – PPO | Admitting: Internal Medicine

## 2021-09-23 DIAGNOSIS — J439 Emphysema, unspecified: Secondary | ICD-10-CM | POA: Diagnosis not present

## 2021-09-23 DIAGNOSIS — R0609 Other forms of dyspnea: Secondary | ICD-10-CM | POA: Diagnosis not present

## 2021-09-23 DIAGNOSIS — U071 COVID-19: Secondary | ICD-10-CM | POA: Diagnosis not present

## 2021-09-23 DIAGNOSIS — Z01818 Encounter for other preprocedural examination: Secondary | ICD-10-CM | POA: Diagnosis not present

## 2021-09-23 DIAGNOSIS — J84112 Idiopathic pulmonary fibrosis: Secondary | ICD-10-CM | POA: Diagnosis not present

## 2021-09-30 ENCOUNTER — Emergency Department: Payer: BC Managed Care – PPO

## 2021-09-30 ENCOUNTER — Inpatient Hospital Stay
Admission: EM | Admit: 2021-09-30 | Discharge: 2021-10-02 | DRG: 282 | Disposition: A | Discharge status: Home / Self Care | Payer: BC Managed Care – PPO | Attending: Internal Medicine | Admitting: Internal Medicine

## 2021-09-30 ENCOUNTER — Other Ambulatory Visit: Payer: Self-pay

## 2021-09-30 DIAGNOSIS — R778 Other specified abnormalities of plasma proteins: Secondary | ICD-10-CM | POA: Diagnosis not present

## 2021-09-30 DIAGNOSIS — R7301 Impaired fasting glucose: Secondary | ICD-10-CM

## 2021-09-30 DIAGNOSIS — G2581 Restless legs syndrome: Secondary | ICD-10-CM | POA: Diagnosis not present

## 2021-09-30 DIAGNOSIS — Z79899 Other long term (current) drug therapy: Secondary | ICD-10-CM | POA: Diagnosis not present

## 2021-09-30 DIAGNOSIS — R9431 Abnormal electrocardiogram [ECG] [EKG]: Secondary | ICD-10-CM | POA: Diagnosis not present

## 2021-09-30 DIAGNOSIS — I2510 Atherosclerotic heart disease of native coronary artery without angina pectoris: Secondary | ICD-10-CM | POA: Diagnosis present

## 2021-09-30 DIAGNOSIS — I214 Non-ST elevation (NSTEMI) myocardial infarction: Principal | ICD-10-CM

## 2021-09-30 DIAGNOSIS — E785 Hyperlipidemia, unspecified: Secondary | ICD-10-CM | POA: Diagnosis present

## 2021-09-30 DIAGNOSIS — Z7982 Long term (current) use of aspirin: Secondary | ICD-10-CM

## 2021-09-30 DIAGNOSIS — E7800 Pure hypercholesterolemia, unspecified: Secondary | ICD-10-CM | POA: Diagnosis present

## 2021-09-30 DIAGNOSIS — I10 Essential (primary) hypertension: Secondary | ICD-10-CM

## 2021-09-30 DIAGNOSIS — Z87891 Personal history of nicotine dependence: Secondary | ICD-10-CM

## 2021-09-30 DIAGNOSIS — R7309 Other abnormal glucose: Secondary | ICD-10-CM | POA: Diagnosis present

## 2021-09-30 DIAGNOSIS — R079 Chest pain, unspecified: Secondary | ICD-10-CM | POA: Diagnosis not present

## 2021-09-30 DIAGNOSIS — J449 Chronic obstructive pulmonary disease, unspecified: Secondary | ICD-10-CM | POA: Diagnosis present

## 2021-09-30 DIAGNOSIS — I252 Old myocardial infarction: Secondary | ICD-10-CM

## 2021-09-30 DIAGNOSIS — J8410 Pulmonary fibrosis, unspecified: Secondary | ICD-10-CM | POA: Diagnosis present

## 2021-09-30 DIAGNOSIS — R739 Hyperglycemia, unspecified: Secondary | ICD-10-CM | POA: Diagnosis present

## 2021-09-30 DIAGNOSIS — Z20822 Contact with and (suspected) exposure to covid-19: Secondary | ICD-10-CM | POA: Diagnosis present

## 2021-09-30 DIAGNOSIS — Z955 Presence of coronary angioplasty implant and graft: Secondary | ICD-10-CM

## 2021-09-30 DIAGNOSIS — R0789 Other chest pain: Secondary | ICD-10-CM | POA: Diagnosis not present

## 2021-09-30 DIAGNOSIS — Z8249 Family history of ischemic heart disease and other diseases of the circulatory system: Secondary | ICD-10-CM

## 2021-09-30 DIAGNOSIS — Z7951 Long term (current) use of inhaled steroids: Secondary | ICD-10-CM | POA: Diagnosis not present

## 2021-09-30 DIAGNOSIS — I2583 Coronary atherosclerosis due to lipid rich plaque: Secondary | ICD-10-CM | POA: Diagnosis not present

## 2021-09-30 HISTORY — DX: Pulmonary fibrosis, unspecified: J84.10

## 2021-09-30 LAB — T4, FREE: Free T4: 0.74 ng/dL (ref 0.61–1.12)

## 2021-09-30 LAB — TROPONIN I (HIGH SENSITIVITY)
Troponin I (High Sensitivity): 7 ng/L (ref <18)
Troponin I (High Sensitivity): 1127 ng/L (ref <18)
Troponin I (High Sensitivity): 3194 ng/L (ref <18)
Troponin I (High Sensitivity): 3829 ng/L (ref <18)

## 2021-09-30 LAB — TSH: TSH: 2.43 u[IU]/mL (ref 0.350–4.500)

## 2021-09-30 LAB — BASIC METABOLIC PANEL
Anion gap: 5 (ref 5–15)
BUN: 21 mg/dL (ref 8–23)
CO2: 26 mmol/L (ref 22–32)
Calcium: 9 mg/dL (ref 8.9–10.3)
Chloride: 102 mmol/L (ref 98–111)
Creatinine, Ser: 0.91 mg/dL (ref 0.61–1.24)
GFR, Estimated: >60 mL/min (ref >60)
Glucose, Bld: 142 mg/dL — ABNORMAL HIGH (ref 70–99)
Potassium: 4 mmol/L (ref 3.5–5.1)
Sodium: 133 mmol/L — ABNORMAL LOW (ref 135–145)

## 2021-09-30 LAB — CBC
HCT: 48 % (ref 39.0–52.0)
Hemoglobin: 16.4 g/dL (ref 13.0–17.0)
MCH: 33.7 pg (ref 26.0–34.0)
MCHC: 34.2 g/dL (ref 30.0–36.0)
MCV: 98.8 fL (ref 80.0–100.0)
Platelets: 195 10*3/uL (ref 150–400)
RBC: 4.86 MIL/uL (ref 4.22–5.81)
RDW: 13.4 % (ref 11.5–15.5)
WBC: 11.3 10*3/uL — ABNORMAL HIGH (ref 4.0–10.5)
nRBC: 0 % (ref 0.0–0.2)

## 2021-09-30 LAB — RESP PANEL BY RT-PCR (FLU A&B, COVID) ARPGX2
Influenza A by PCR: NEGATIVE
Influenza B by PCR: NEGATIVE
SARS Coronavirus 2 by RT PCR: NEGATIVE

## 2021-09-30 LAB — APTT: aPTT: 27 seconds (ref 24–36)

## 2021-09-30 LAB — PROTIME-INR
INR: 1.1 (ref 0.8–1.2)
Prothrombin Time: 14.6 seconds (ref 11.4–15.2)

## 2021-09-30 MED ORDER — SODIUM CHLORIDE 0.9% FLUSH: 3.0000 mL | INTRAVENOUS | Status: DC | PRN

## 2021-09-30 MED ORDER — SODIUM CHLORIDE 0.9% FLUSH
3.0000 mL | Freq: Two times a day (BID) | INTRAVENOUS | Status: DC
  Administered 2021-09-30 – 2021-10-02 (×4): 3 mL via INTRAVENOUS

## 2021-09-30 MED ORDER — ONDANSETRON HCL 4 MG/2ML IJ SOLN: 4.0000 mg | Freq: Four times a day (QID) | INTRAMUSCULAR | Status: DC | PRN

## 2021-09-30 MED ORDER — CARVEDILOL 6.25 MG PO TABS
3.1250 mg | ORAL_TABLET | Freq: Two times a day (BID) | ORAL | Status: DC
  Administered 2021-10-01: 3.125 mg via ORAL
  Filled 2021-09-30: qty 1

## 2021-09-30 MED ORDER — SODIUM CHLORIDE 0.9 % IV SOLN: 250.0000 mL | INTRAVENOUS | Status: DC | PRN

## 2021-09-30 MED ORDER — HEPARIN BOLUS VIA INFUSION
4000.0000 [IU] | Freq: Once | INTRAVENOUS | Status: AC
  Administered 2021-09-30: 4000 [IU] via INTRAVENOUS
  Filled 2021-09-30: qty 4000

## 2021-09-30 MED ORDER — ATORVASTATIN CALCIUM 20 MG PO TABS: 40.0000 mg | ORAL_TABLET | Freq: Every day | ORAL | Status: DC

## 2021-09-30 MED ORDER — HEPARIN (PORCINE) 25000 UT/250ML-% IV SOLN
1250.0000 [IU]/h | INTRAVENOUS | Status: DC
  Administered 2021-09-30: 19:00:00 1000 [IU]/h via INTRAVENOUS
  Administered 2021-10-01: 1250 [IU]/h via INTRAVENOUS
  Filled 2021-09-30 (×2): qty 250

## 2021-09-30 MED ORDER — ASPIRIN EC 81 MG PO TBEC
81.0000 mg | DELAYED_RELEASE_TABLET | Freq: Every day | ORAL | Status: DC
  Administered 2021-10-01 – 2021-10-02 (×2): 81 mg via ORAL
  Filled 2021-09-30 (×2): qty 1

## 2021-09-30 MED ORDER — ASPIRIN 81 MG PO CHEW
324.0000 mg | CHEWABLE_TABLET | Freq: Once | ORAL | Status: AC
  Administered 2021-09-30: 324 mg via ORAL
  Filled 2021-09-30: qty 4

## 2021-09-30 MED ORDER — PIRFENIDONE 267 MG PO CAPS: 3.0000 | ORAL_CAPSULE | Freq: Three times a day (TID) | ORAL | Status: DC

## 2021-09-30 MED ORDER — ATORVASTATIN CALCIUM 80 MG PO TABS
80.0000 mg | ORAL_TABLET | Freq: Every day | ORAL | Status: DC
  Administered 2021-10-01: 80 mg via ORAL
  Filled 2021-09-30: qty 1

## 2021-09-30 MED ORDER — ACETAMINOPHEN 325 MG PO TABS
650.0000 mg | ORAL_TABLET | ORAL | Status: DC | PRN
  Administered 2021-10-01 (×2): 650 mg via ORAL
  Filled 2021-09-30 (×2): qty 2

## 2021-09-30 MED ORDER — NITROGLYCERIN 0.4 MG SL SUBL: 0.4000 mg | SUBLINGUAL_TABLET | SUBLINGUAL | Status: DC | PRN

## 2021-09-30 NOTE — ED Notes (Signed)
Pt transferred to hospital bed. Denies pain at this time, in nad

## 2021-09-30 NOTE — H&P (Signed)
History and Physical    Dillon [name] YKD:[id number] DOB: 11/28/1955 DOA: 09/30/2021  PCP: [name], MD    Patient coming from:  Home    Chief Complaint:  Chest pain    HPI:  Dillon [name] is a 65 y.o. male seen in ed with complaints of chest pain: Middle chest. 9/10 and currently 0/10. Radiating to Right arm. Dull pressure pain. SOB +/ No nausea vomiting/diaphoresis. 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  He walked outside and sat down then, went to bathroom,did not feel good and went home.  At home and wife brought to ed.   Pt has past medical history of CAD, pulmonary fibrosis, hypertension, COPD, hyperlipidemia. ED Course:  Vitals:   09/30/21 1219 09/30/21 1224 09/30/21 1832 09/30/21 1833  BP: 122/86  123/77   Pulse: 65  66   Resp: 17  18 19   Temp:  97.8 F (36.6 C)    TempSrc: Oral     SpO2: 97%  98%   Weight: 82.6 kg     Height: 6\' 2"  (1.88 m)       Review of Systems:  Review of Systems  Constitutional: Negative.   HENT: Negative.    Eyes: Negative.   Respiratory:  Positive for shortness of breath.   Cardiovascular:  Positive for chest pain.  Gastrointestinal: Negative.   Genitourinary: Negative.   Musculoskeletal: Negative.   Skin: Negative.   Neurological: Negative.   Endo/Heme/Allergies: Negative.   Psychiatric/Behavioral: Negative.    All other systems reviewed and are negative.   Past Medical History:  Diagnosis Date   Coronary artery disease    Elevated cholesterol    Restless leg syndrome     Past Surgical History:  Procedure Laterality Date   ANGIOPLASTY  2010   2 stents placed   BACK SURGERY     COLONOSCOPY WITH PROPOFOL N/A 03/05/2018   Procedure: COLONOSCOPY WITH PROPOFOL;  Surgeon: Virgel Manifold, MD;  Location: [facility] ENDOSCOPY;  Service: Endoscopy;  Laterality: N/A;   CORONARY ANGIOPLASTY     CORONARY/GRAFT ACUTE MI REVASCULARIZATION N/A 09/30/2019   Procedure: Coronary/Graft Acute MI Revascularization;  Surgeon:  Wellington Hampshire, MD;  Location: [place] [facility];  Service: Cardiovascular;  Laterality: N/A;   LEFT HEART CATH AND CORONARY ANGIOGRAPHY N/A 09/30/2019   Procedure: LEFT HEART CATH AND CORONARY ANGIOGRAPHY;  Surgeon: Wellington Hampshire, MD;  Location: [place] [facility];  Service: Cardiovascular;  Laterality: N/A;     reports that he quit smoking about 12 years ago. His smoking use included cigarettes. He has never used smokeless tobacco. He reports that he does not currently use alcohol. He reports that he does not currently use drugs.  Allergies  Allergen Reactions   Ace Inhibitors Swelling    Causes cough and swelling    Family History  Problem Relation Age of Onset   Heart disease Mother    Heart disease Father     Prior to Admission medications   Medication Sig Start Date End Date Taking? Authorizing Provider  albuterol (VENTOLIN HFA) 108 (90 Base) MCG/ACT inhaler Inhale 1-2 puffs into the lungs every 6 (six) hours as needed for wheezing or shortness of breath. Patient not taking: Reported on 07/12/2021 05/08/20   [provider]  aspirin EC 81 MG tablet Take 81 mg by mouth daily. Swallow whole.    [provider]  atorvastatin (LIPITOR) 40 MG tablet TAKE 1 TABLET BY MOUTH EVERY DAY 06/13/21   [name], MD  carvedilol (COREG) 3.125 MG tablet TAKE 1 TABLET (3.125 MG TOTAL) BY MOUTH 2 (TWO) TIMES DAILY WITH A MEAL. 05/13/21   Wellington Hampshire, MD  Fluticasone-Umeclidin-Vilant (TRELEGY ELLIPTA) 100-62.5-25 MCG/INH AEPB Inhale into the lungs.    [provider]  HYDROMET 5-1.5 MG/5ML syrup Take 5 mLs by mouth every 6 (six) hours as needed. 05/31/21   [provider]  nitroGLYCERIN (NITROSTAT) 0.3 MG SL tablet Place 1 tablet (0.3 mg total) under the tongue every 5 (five) minutes as needed for chest pain. 11/28/19   Loel Dubonnet, NP  Pirfenidone (ESBRIET) 267 MG CAPS Take 3 capsules by mouth 3 (three) times daily.    [provider]   triamcinolone ointment (KENALOG) 0.1 % Apply 1 application topically daily as needed. Avoid face, groin, underarms 01/23/21   Laurence Ferrari, [place], MD    Physical Exam: Vitals:   09/30/21 1219 09/30/21 1224 09/30/21 1832 09/30/21 1833  BP: 122/86  123/77   Pulse: 65  66   Resp: 17  18 19   Temp:  97.8 F (36.6 C)    TempSrc: Oral     SpO2: 97%  98%   Weight: 82.6 kg     Height: 6\' 2"  (1.88 m)      Physical Exam Vitals reviewed.  Constitutional:      General: He is not in acute distress.    Appearance: He is not ill-appearing, toxic-appearing or diaphoretic.  HENT:     Head: Normocephalic and atraumatic.     Right Ear: External ear normal.     Left Ear: External ear normal.     Nose: Nose normal.     Mouth/Throat:     Mouth: Mucous membranes are moist.  Eyes:     Extraocular Movements: Extraocular movements intact.     Pupils: Pupils are equal, round, and reactive to light.  Neck:     Vascular: No carotid bruit.  Cardiovascular:     Rate and Rhythm: Normal rate and regular rhythm.     Pulses: Normal pulses.          Dorsalis pedis pulses are 2+ on the right side and 2+ on the left side.       Posterior tibial pulses are 2+ on the right side and 2+ on the left side.     Heart sounds: Normal heart sounds.  Pulmonary:     Effort: Pulmonary effort is normal.     Breath sounds: Normal breath sounds.  Chest:     Chest wall: No mass, deformity, swelling, tenderness, crepitus or edema.    Abdominal:     General: Bowel sounds are normal. There is no distension.     Palpations: Abdomen is soft. There is no mass.     Tenderness: There is no abdominal tenderness. There is no guarding.     Hernia: No hernia is present.  Musculoskeletal:        General: Normal range of motion.     Cervical back: No tenderness.     Right lower leg: No edema.     Left lower leg: No edema.  Lymphadenopathy:     Cervical: No cervical adenopathy.  Skin:    General: Skin is warm.  Neurological:      General: No focal deficit present.     Mental Status: He is alert and oriented to person, place, and time.     Cranial Nerves: No cranial nerve deficit.     Motor: No weakness.  Psychiatric:  Mood and Affect: Mood normal.        Behavior: Behavior normal.     Labs on Admission: I have personally reviewed following labs and imaging studies  No results for input(s): CKTOTAL, CKMB, TROPONINI in the last 72 hours. Lab Results  Component Value Date   WBC 11.3 (H) 09/30/2021   HGB 16.4 09/30/2021   HCT 48.0 09/30/2021   MCV 98.8 09/30/2021   PLT 195 09/30/2021    Recent Labs  Lab 09/30/21 1224  NA 133*  K 4.0  CL 102  CO2 26  BUN 21  CREATININE 0.91  CALCIUM 9.0  GLUCOSE 142*   Lab Results  Component Value Date   CHOL 121 08/31/2020   HDL 41 08/31/2020   LDLCALC 63 08/31/2020   TRIG 90 08/31/2020   No results found for: DDIMER Invalid input(s): POCBNP   COVID-19 Labs No results for input(s): DDIMER, FERRITIN, LDH, CRP in the last 72 hours. Lab Results  Component Value Date   SARSCOV2NAA NEGATIVE 09/30/2021   SARSCOV2NAA NEGATIVE 06/11/2020   SARSCOV2NAA Detected (A) 09/23/2019    Radiological Exams on Admission: DG Chest 2 View  Result Date: 09/30/2021 CLINICAL DATA:  Chest pain. EXAM: CHEST - 2 VIEW COMPARISON:  08/25/2019 FINDINGS: Lungs are hyperexpanded. The lungs are clear without focal pneumonia, edema, pneumothorax or pleural effusion. Chronic interstitial lung disease is similar to prior. The cardiopericardial silhouette is within normal limits for size. The visualized bony structures of the thorax show no acute abnormality. IMPRESSION: Hyperexpansion with chronic interstitial lung disease. No acute cardiopulmonary findings. Electronically Signed   By: Misty Stanley M.D.   On: 09/30/2021 12:41    EKG: Independently reviewed.  Sinus rhythm at 68 with left axis deviation no ST elevations or T wave inversions.  Assessment/Plan: Principal Problem:    Chest pain Active Problems:   Hyperlipidemia   Coronary artery disease due to lipid rich plaque   NSTEMI (non-ST elevated myocardial infarction) (HCC)   Primary hypertension   Elevated glucose level   Pulmonary fibrosis ([place])   Chest pain/hyperlipidemia/CAD/NSTEMI: Admit patient to progressive unit with continuous cardiac monitoring per ACS protocol. We will continue patient on aspirin, heparin GTT, high intensity statin and Coreg. Cardiology consult for a.m. message. N.p.o. after midnight. Continue with troponin cycling.  Primary hypertension: Blood pressure 123/77, pulse 66, temperature 97.8 F (36.6 C), resp. rate 19, height 6\' 2"  (1.88 m), weight 82.6 kg, SpO2 98 %. Continued on Coreg.  Elevated glucose level: Will obtain an A1c.  Pulmonary fibrosis: Patient sees pulmonologist outpatient basis. We have continued patient's pirfenidone 3 times daily.    DVT prophylaxis:  Heparin  Code Status:  Full code  Family Communication:  Avetisyan,Patricia T (Spouse)  [phone] (Mobile)   Disposition Plan:  Home  Consults called:  A.m. message sent to Dr. Ronna Polio.  Admission status: Inpatient   Para Skeans MD [facility] [phone] How to contact the [facility] Attending or Consulting provider [place] or covering provider during after hours [place], for this patient.    Check the care team in [facility] and look for a) attending/consulting [facility] provider listed and b) the [facility] team listed Log into [email or web address] and use [place]'s universal password to access. If you do not have the password, please contact the hospital operator. Locate the [facility] provider you are looking for under [facility] and page to a number that you can be directly reached. If you still have difficulty reaching the provider, please page  the [facility] (Director on Call) for the Hospitalists listed on amion for assistance. [email or web address] Password [email or web address] 09/30/2021, 8:59 PM

## 2021-09-30 NOTE — ED Triage Notes (Signed)
Pt c/o squeezing chest pain that started about 1-2hrs PTA> pt is in NAD on arrival

## 2021-09-30 NOTE — ED Provider Notes (Signed)
[facility] Emergency Department Provider Note   ____________________________________________   I have reviewed the triage vital signs and the nursing notes.   HISTORY  Chief Complaint Chest Pain   History limited by: Not Limited   HPI Dillon [name] is a 65 y.o. male who presents to the emergency department today because of concerns for chest pain.  The pain was located in his left center chest.  He describes it as pressure-like.  Started today around 10 to 10:30 AM.  That lasted for a few hours.  The time my exam he states it has improved.  Patient did have sweating with this.  He states it reminded of when he has had heart attacks in the past. Did not try any NTG since his had expired.    Records reviewed. Per medical record review patient has a history of CAD, stents.   Past Medical History:  Diagnosis Date   Coronary artery disease    Elevated cholesterol    Restless leg syndrome     Patient Active Problem List   Diagnosis Date Noted   Weight loss 06/12/2021   Abrasion of left arm 12/07/2020   Chronic obstructive pulmonary disease ([place]) 12/07/2020   Primary hypertension 12/07/2020   Fever in adult 09/07/2020   Hyperlipidemia 09/07/2020   Cellulitis of left buttock 06/11/2020   Pulmonary fibrosis ([place]) 05/11/2020   Coronary artery disease due to lipid rich plaque 05/11/2020   Dermatitis 05/11/2020   Disorder of left rotator cuff 03/09/2020   ED (erectile dysfunction) of non-organic origin 03/09/2020   ST elevation myocardial infarction (STEMI) ([place])    COVID-19 virus infection    Acute ST elevation myocardial infarction (STEMI) of anterior wall ([place]) 09/30/2019   Annual physical exam    Rectal polyp    Benign neoplasm of ascending colon    Internal hemorrhoids    Proctitis    Diverticulosis of large intestine without diverticulitis     Past Surgical History:  Procedure Laterality Date   ANGIOPLASTY  2010   2 stents placed   BACK  SURGERY     COLONOSCOPY WITH PROPOFOL N/A 03/05/2018   Procedure: COLONOSCOPY WITH PROPOFOL;  Surgeon: Virgel Manifold, MD;  Location: [facility] ENDOSCOPY;  Service: Endoscopy;  Laterality: N/A;   CORONARY ANGIOPLASTY     CORONARY/GRAFT ACUTE MI REVASCULARIZATION N/A 09/30/2019   Procedure: Coronary/Graft Acute MI Revascularization;  Surgeon: Wellington Hampshire, MD;  Location: [place] [facility];  Service: Cardiovascular;  Laterality: N/A;   LEFT HEART CATH AND CORONARY ANGIOGRAPHY N/A 09/30/2019   Procedure: LEFT HEART CATH AND CORONARY ANGIOGRAPHY;  Surgeon: Wellington Hampshire, MD;  Location: [place] [facility];  Service: Cardiovascular;  Laterality: N/A;    Prior to Admission medications   Medication Sig Start Date End Date Taking? Authorizing Provider  albuterol (VENTOLIN HFA) 108 (90 Base) MCG/ACT inhaler Inhale 1-2 puffs into the lungs every 6 (six) hours as needed for wheezing or shortness of breath. Patient not taking: Reported on 07/12/2021 05/08/20   [provider]  aspirin EC 81 MG tablet Take 81 mg by mouth daily. Swallow whole.    [provider]  atorvastatin (LIPITOR) 40 MG tablet TAKE 1 TABLET BY MOUTH EVERY DAY 06/13/21   [name], MD  carvedilol (COREG) 3.125 MG tablet TAKE 1 TABLET (3.125 MG TOTAL) BY MOUTH 2 (TWO) TIMES DAILY WITH A MEAL. 05/13/21   Wellington Hampshire, MD  Fluticasone-Umeclidin-Vilant (TRELEGY ELLIPTA) 100-62.5-25 MCG/INH AEPB Inhale into the lungs.  [provider]  HYDROMET 5-1.5 MG/5ML syrup Take 5 mLs by mouth every 6 (six) hours as needed. 05/31/21   [provider]  nitroGLYCERIN (NITROSTAT) 0.3 MG SL tablet Place 1 tablet (0.3 mg total) under the tongue every 5 (five) minutes as needed for chest pain. 11/28/19   Loel Dubonnet, NP  Pirfenidone (ESBRIET) 267 MG CAPS Take 3 capsules by mouth 3 (three) times daily.    [provider]  triamcinolone ointment (KENALOG) 0.1 % Apply 1 application topically  daily as needed. Avoid face, groin, underarms 01/23/21   Moye, [place], MD    Allergies Ace inhibitors  Family History  Problem Relation Age of Onset   Heart disease Mother    Heart disease Father     Social History Social History   Tobacco Use   Smoking status: Former    Types: Cigarettes    Quit date: 03/05/2009    Years since quitting: 12.5   Smokeless tobacco: Never  Vaping Use   Vaping Use: Never used  Substance Use Topics   Alcohol use: Not Currently    Comment: occassionally   Drug use: Not Currently    Review of Systems Constitutional: No fever/chills Eyes: No visual changes. ENT: No sore throat. Cardiovascular: Positive for chest pain. Respiratory: Denies shortness of breath. Gastrointestinal: No abdominal pain.  No nausea, no vomiting.  No diarrhea.   Genitourinary: Negative for dysuria. Musculoskeletal: Negative for back pain. Skin: Negative for rash. Neurological: Negative for headaches, focal weakness or numbness.  ____________________________________________   PHYSICAL EXAM:  VITAL SIGNS: ED Triage Vitals  Enc Vitals Group     BP 09/30/21 1219 122/86     Pulse Rate 09/30/21 1219 65     Resp 09/30/21 1219 17     Temp 09/30/21 1224 97.8 F (36.6 C)     Temp Source 09/30/21 1219 Oral     SpO2 09/30/21 1219 97 %     Weight 09/30/21 1219 182 lb (82.6 kg)     Height 09/30/21 1219 6\' 2"  (1.88 m)     Head Circumference --      Peak Flow --      Pain Score 09/30/21 1219 8   Constitutional: Alert and oriented.  Eyes: Conjunctivae are normal.  ENT      Head: Normocephalic and atraumatic.      Nose: No congestion/rhinnorhea.      Mouth/Throat: Mucous membranes are moist.      Neck: No stridor. Hematological/Lymphatic/Immunilogical: No cervical lymphadenopathy. Cardiovascular: Normal rate, regular rhythm.  No murmurs, rubs, or gallops. Respiratory: Normal respiratory effort without tachypnea nor retractions. Breath sounds are clear and equal  bilaterally. No wheezes/rales/rhonchi. Gastrointestinal: Soft and non tender. No rebound. No guarding.  Genitourinary: Deferred Musculoskeletal: Normal range of motion in all extremities. No lower extremity edema. Neurologic:  Normal speech and language. No gross focal neurologic deficits are appreciated.  Skin:  Skin is warm, dry and intact. No rash noted. Psychiatric: Mood and affect are normal. Speech and behavior are normal. Patient exhibits appropriate insight and judgment.  ____________________________________________    LABS (pertinent positives/negatives)  Trop hs 1127 CBC wbc 11.3, hgb 16.4, plt 195 BMP na 133, glu 142 otherwise wnl  ____________________________________________   EKG  I, Nance Pear, attending physician, personally viewed and interpreted this EKG  EKG Time: 1218 Rate: 68 Rhythm: normal sinus rhythm Axis: left axis deviation Intervals: qtc 406 QRS: narrow ST changes: no st elevation Impression: normal ekg  I, Nance Pear, attending physician,  personally viewed and interpreted this EKG  EKG Time: 1851 Rate: 66 Rhythm: normal sinus rhythm Axis: normal Intervals: qtc 420 QRS: narrow ST changes: no st elevation Impression: normal ekg  ____________________________________________    RADIOLOGY  CXR No acute abnormality  ____________________________________________   PROCEDURES  Procedures  CRITICAL CARE Performed by: Nance Pear   Total critical care time: 20 minutes  Critical care time was exclusive of separately billable procedures and treating other patients.  Critical care was necessary to treat or prevent imminent or life-threatening deterioration.  Critical care was time spent personally by me on the following activities: development of treatment plan with patient and/or surrogate as well as nursing, discussions with consultants, evaluation of patient's response to treatment, examination of patient, obtaining  history from patient or surrogate, ordering and performing treatments and interventions, ordering and review of laboratory studies, ordering and review of radiographic studies, pulse oximetry and re-evaluation of patient's condition.  ____________________________________________   INITIAL IMPRESSION / ASSESSMENT AND PLAN / ED COURSE  Pertinent labs & imaging results that were available during my care of the patient were reviewed by me and considered in my medical decision making (see chart for details).   Patient presented to the emergency department today because of concerns for chest pain.  Initial EKG without ST elevation.  Initial troponin negative.  However repeat troponin was elevated 1127.  Patient does have a history of coronary artery disease and has had stent placement in the past.  Will give patient full dose aspirin as well as start on heparin drip.  Did repeat EKG which again does not show any acute ST elevation.  Discussed findings with patient.  Will plan on admission to the hospital service.  ____________________________________________   FINAL CLINICAL IMPRESSION(S) / ED DIAGNOSES  Final diagnoses:  Chest pain, unspecified type  Elevated troponin     Note: This dictation was prepared with Dragon dictation. Any transcriptional errors that result from this process are unintentional     Nance Pear, MD 09/30/21 1902

## 2021-09-30 NOTE — Consult Note (Addendum)
ANTICOAGULATION CONSULT NOTE - Initial Consult  Pharmacy Consult for Heparin infusion Indication: chest pain/ACS  Allergies  Allergen Reactions   Ace Inhibitors Swelling    Causes cough and swelling    Patient Measurements: Height: 6\' 2"  (188 cm) Weight: 82.6 kg (182 lb) IBW/kg (Calculated) : 82.2 Heparin Dosing Weight: 82.6 kg  Vital Signs: Temp: 97.8 F (36.6 C) (12/12 1224) Temp Source: Oral (12/12 1219) BP: 122/86 (12/12 1219) Pulse Rate: 65 (12/12 1219)  Labs: Recent Labs    09/30/21 1224 09/30/21 1716  HGB 16.4  --   HCT 48.0  --   PLT 195  --   CREATININE 0.91  --   TROPONINIHS 7 1,127*    Estimated Creatinine Clearance: 94.1 mL/min (by C-G formula based on SCr of 0.91 mg/dL).   Medical History: Past Medical History:  Diagnosis Date   Coronary artery disease    Elevated cholesterol    Restless leg syndrome     Medications:  No Prior anticoagulation noted   Assessment:  65 y.o. male complains of chest pain with associated diaphoresis. Troponin 1127. Pharmacy has been consulted for heparin infusion for ACS.   Baseline labs: CBC adequate, INR 1.1, aPTT 27s Goal of Therapy:  Heparin level 0.3-0.7 units/ml Monitor platelets by anticoagulation protocol: Yes   Plan:  Give 4000 units bolus x 1 Start heparin infusion at 1000 units/hr Check anti-Xa level in 6 hours and daily while on heparin Continue to monitor H&H and platelets  Dorothe Pea, PharmD, BCPS Clinical Pharmacist   09/30/2021,6:45 PM

## 2021-09-30 NOTE — ED Provider Notes (Signed)
Emergency Medicine Provider Triage Evaluation Note  Dillon [name] , a 65 y.o. male  was evaluated in triage.  Pt complains of chest pain. He noted on onset of central chest pain while at work this morning. He awoke in his normal level of health. By 10 am, he began to experience dull chest pain with associated diaphoresis.  He rated pain at that time at 8 out of 10.  He drove himself home and had his wife present to the ED.  He gives a history of cardiac stent and AMI x2.  He has not had to use any nitroglycerin in several years, and without encounter take any prior to arrival.  He denies any syncope, cough, nausea, vomiting, or dizziness.  Review of Systems  Positive: CP, diaphoresis Negative: Syncope, NV  Physical Exam  BP 122/86 (BP Location: Left Arm)   Pulse 65   Temp 97.8 F (36.6 C)   Resp 17   Ht 6\' 2"  (1.88 m)   Wt 82.6 kg   SpO2 97%   BMI 23.37 kg/m  Gen:   Awake, no distress  NAD Resp:  Normal effort CTA MSK:   Moves extremities without difficulty  Other:  CVS: RRR  Medical Decision Making  Medically screening exam initiated at 5:14 PM.  Appropriate orders placed.  [name] was informed that the remainder of the evaluation will be completed by another provider, this initial triage assessment does not replace that evaluation, and the importance of remaining in the ED until their evaluation is complete.  Geriatric patient cardiac history, with a ED evaluation of sudden onset of central chest pain with associated diaphoresis about 10:00 this morning while at work.   Melvenia Needles, PA-C 09/30/21 1721    Arta Silence, MD 09/30/21 Sharilyn Sites

## 2021-10-01 ENCOUNTER — Encounter: Payer: Self-pay | Admitting: Internal Medicine

## 2021-10-01 ENCOUNTER — Other Ambulatory Visit: Payer: Self-pay

## 2021-10-01 ENCOUNTER — Encounter
Admission: EM | Disposition: A | Discharge status: Home / Self Care | Payer: Self-pay | Source: Home / Self Care | Attending: Internal Medicine

## 2021-10-01 DIAGNOSIS — R7301 Impaired fasting glucose: Secondary | ICD-10-CM

## 2021-10-01 DIAGNOSIS — I2510 Atherosclerotic heart disease of native coronary artery without angina pectoris: Secondary | ICD-10-CM

## 2021-10-01 HISTORY — PX: LEFT HEART CATH AND CORS/GRAFTS ANGIOGRAPHY: CATH118250

## 2021-10-01 LAB — CBC
HCT: 42.4 % (ref 39.0–52.0)
Hemoglobin: 14.5 g/dL (ref 13.0–17.0)
MCH: 33.4 pg (ref 26.0–34.0)
MCHC: 34.2 g/dL (ref 30.0–36.0)
MCV: 97.7 fL (ref 80.0–100.0)
Platelets: 157 10*3/uL (ref 150–400)
RBC: 4.34 MIL/uL (ref 4.22–5.81)
RDW: 13.5 % (ref 11.5–15.5)
WBC: 9.1 10*3/uL (ref 4.0–10.5)
nRBC: 0 % (ref 0.0–0.2)

## 2021-10-01 LAB — PROTIME-INR
INR: 1.1 (ref 0.8–1.2)
Prothrombin Time: 14.3 seconds (ref 11.4–15.2)

## 2021-10-01 LAB — BASIC METABOLIC PANEL
Anion gap: 4 — ABNORMAL LOW (ref 5–15)
BUN: 17 mg/dL (ref 8–23)
CO2: 25 mmol/L (ref 22–32)
Calcium: 8 mg/dL — ABNORMAL LOW (ref 8.9–10.3)
Chloride: 106 mmol/L (ref 98–111)
Creatinine, Ser: 0.65 mg/dL (ref 0.61–1.24)
GFR, Estimated: >60 mL/min (ref >60)
Glucose, Bld: 99 mg/dL (ref 70–99)
Potassium: 3.7 mmol/L (ref 3.5–5.1)
Sodium: 135 mmol/L (ref 135–145)

## 2021-10-01 LAB — TROPONIN I (HIGH SENSITIVITY)
Troponin I (High Sensitivity): 3762 ng/L (ref <18)
Troponin I (High Sensitivity): 3220 ng/L (ref <18)
Troponin I (High Sensitivity): 2293 ng/L (ref <18)
Troponin I (High Sensitivity): 2677 ng/L (ref <18)
Troponin I (High Sensitivity): 2319 ng/L (ref <18)
Troponin I (High Sensitivity): 1842 ng/L (ref <18)

## 2021-10-01 LAB — LIPID PANEL
Cholesterol: 118 mg/dL (ref 0–200)
HDL: 45 mg/dL (ref >40)
LDL Cholesterol: 64 mg/dL (ref 0–99)
Total CHOL/HDL Ratio: 2.6 RATIO
Triglycerides: 45 mg/dL (ref <150)
VLDL: 9 mg/dL (ref 0–40)

## 2021-10-01 LAB — HEPARIN LEVEL (UNFRACTIONATED)
Heparin Unfractionated: 0.18 IU/mL — ABNORMAL LOW (ref 0.30–0.70)
Heparin Unfractionated: 0.44 IU/mL (ref 0.30–0.70)

## 2021-10-01 LAB — HIV ANTIBODY (ROUTINE TESTING W REFLEX): HIV Screen 4th Generation wRfx: NONREACTIVE

## 2021-10-01 SURGERY — LEFT HEART CATH AND CORS/GRAFTS ANGIOGRAPHY
Anesthesia: Moderate Sedation

## 2021-10-01 MED ORDER — MIDAZOLAM HCL 2 MG/2ML IJ SOLN
INTRAMUSCULAR | Status: AC
  Filled 2021-10-01: qty 2

## 2021-10-01 MED ORDER — SODIUM CHLORIDE 0.9 % IV SOLN: 250.0000 mL | INTRAVENOUS | Status: DC | PRN

## 2021-10-01 MED ORDER — SODIUM CHLORIDE 0.9% FLUSH
3.0000 mL | Freq: Two times a day (BID) | INTRAVENOUS | Status: DC
  Administered 2021-10-02 (×2): 3 mL via INTRAVENOUS

## 2021-10-01 MED ORDER — LABETALOL HCL 5 MG/ML IV SOLN: 10.0000 mg | INTRAVENOUS | Status: AC | PRN

## 2021-10-01 MED ORDER — SODIUM CHLORIDE 0.9% FLUSH: 3.0000 mL | INTRAVENOUS | Status: DC | PRN

## 2021-10-01 MED ORDER — ISOSORBIDE MONONITRATE ER 30 MG PO TB24
15.0000 mg | ORAL_TABLET | Freq: Every day | ORAL | Status: DC
  Administered 2021-10-01 – 2021-10-02 (×2): 15 mg via ORAL
  Filled 2021-10-01 (×2): qty 1

## 2021-10-01 MED ORDER — SODIUM CHLORIDE 0.9 % WEIGHT BASED INFUSION
3.0000 mL/kg/h | INTRAVENOUS | Status: DC
  Administered 2021-10-01: 3 mL/kg/h via INTRAVENOUS

## 2021-10-01 MED ORDER — CLOPIDOGREL BISULFATE 75 MG PO TABS
75.0000 mg | ORAL_TABLET | Freq: Every day | ORAL | Status: DC
  Administered 2021-10-02: 09:00:00 75 mg via ORAL
  Filled 2021-10-01: qty 1

## 2021-10-01 MED ORDER — FENTANYL CITRATE (PF) 100 MCG/2ML IJ SOLN
INTRAMUSCULAR | Status: DC | PRN
  Administered 2021-10-01 (×2): 25 ug via INTRAVENOUS

## 2021-10-01 MED ORDER — UMECLIDINIUM BROMIDE 62.5 MCG/ACT IN AEPB
1.0000 | INHALATION_SPRAY | Freq: Every day | RESPIRATORY_TRACT | Status: DC
  Filled 2021-10-01: qty 7

## 2021-10-01 MED ORDER — LIDOCAINE HCL (PF) 1 % IJ SOLN
INTRAMUSCULAR | Status: DC | PRN
  Administered 2021-10-01: 2 mL
  Administered 2021-10-01: 10 mL

## 2021-10-01 MED ORDER — FENTANYL CITRATE (PF) 100 MCG/2ML IJ SOLN
INTRAMUSCULAR | Status: AC
  Filled 2021-10-01: qty 2

## 2021-10-01 MED ORDER — HYDRALAZINE HCL 20 MG/ML IJ SOLN: 10.0000 mg | INTRAMUSCULAR | Status: AC | PRN

## 2021-10-01 MED ORDER — VERAPAMIL HCL 2.5 MG/ML IV SOLN
INTRAVENOUS | Status: AC
  Filled 2021-10-01: qty 2

## 2021-10-01 MED ORDER — FLUTICASONE FUROATE-VILANTEROL 100-25 MCG/ACT IN AEPB
1.0000 | INHALATION_SPRAY | Freq: Every day | RESPIRATORY_TRACT | Status: DC
  Filled 2021-10-01 (×2): qty 28

## 2021-10-01 MED ORDER — LIDOCAINE HCL 1 % IJ SOLN
INTRAMUSCULAR | Status: AC
  Filled 2021-10-01: qty 20

## 2021-10-01 MED ORDER — HEPARIN (PORCINE) IN NACL 1000-0.9 UT/500ML-% IV SOLN
INTRAVENOUS | Status: DC | PRN
  Administered 2021-10-01: 1000 mL

## 2021-10-01 MED ORDER — MIDAZOLAM HCL 2 MG/2ML IJ SOLN
INTRAMUSCULAR | Status: DC | PRN
  Administered 2021-10-01 (×2): 1 mg via INTRAVENOUS

## 2021-10-01 MED ORDER — SODIUM CHLORIDE 0.9 % IV SOLN: INTRAVENOUS | Status: AC

## 2021-10-01 MED ORDER — HEPARIN SODIUM (PORCINE) 1000 UNIT/ML IJ SOLN
INTRAMUSCULAR | Status: AC
  Filled 2021-10-01: qty 10

## 2021-10-01 MED ORDER — HEPARIN BOLUS VIA INFUSION
2500.0000 [IU] | INTRAVENOUS | Status: AC
  Administered 2021-10-01: 2500 [IU] via INTRAVENOUS
  Filled 2021-10-01: qty 2500

## 2021-10-01 MED ORDER — IOHEXOL 300 MG/ML  SOLN
INTRAMUSCULAR | Status: DC | PRN
  Administered 2021-10-01: 65 mL

## 2021-10-01 MED ORDER — ENOXAPARIN SODIUM 40 MG/0.4ML IJ SOSY
40.0000 mg | PREFILLED_SYRINGE | Freq: Every day | INTRAMUSCULAR | Status: DC
  Administered 2021-10-02: 09:00:00 40 mg via SUBCUTANEOUS
  Filled 2021-10-01 (×3): qty 0.4

## 2021-10-01 MED ORDER — SODIUM CHLORIDE 0.9 % WEIGHT BASED INFUSION: 1.0000 mL/kg/h | INTRAVENOUS | Status: DC

## 2021-10-01 MED ORDER — SODIUM CHLORIDE 0.9% FLUSH
3.0000 mL | Freq: Two times a day (BID) | INTRAVENOUS | Status: DC
  Administered 2021-10-01 – 2021-10-02 (×3): 3 mL via INTRAVENOUS

## 2021-10-01 MED ORDER — HEPARIN (PORCINE) IN NACL 1000-0.9 UT/500ML-% IV SOLN
INTRAVENOUS | Status: AC
  Filled 2021-10-01: qty 1000

## 2021-10-01 MED ORDER — EZETIMIBE 10 MG PO TABS
10.0000 mg | ORAL_TABLET | Freq: Every day | ORAL | Status: DC
  Administered 2021-10-01 – 2021-10-02 (×2): 10 mg via ORAL
  Filled 2021-10-01 (×2): qty 1

## 2021-10-01 MED ORDER — CLOPIDOGREL BISULFATE 75 MG PO TABS
300.0000 mg | ORAL_TABLET | Freq: Once | ORAL | Status: AC
  Administered 2021-10-01: 300 mg via ORAL
  Filled 2021-10-01: qty 4

## 2021-10-01 SURGICAL SUPPLY — 15 items
CANNULA 5F STIFF (CANNULA) ×2 IMPLANT
CATH INFINITI 5FR MULTPACK ANG (CATHETERS) ×2 IMPLANT
DEVICE CLOSURE MYNXGRIP 5F (Vascular Products) ×2 IMPLANT
DRAPE BRACHIAL (DRAPES) ×2 IMPLANT
GLIDESHEATH SLEND A-KIT 6F 22G (SHEATH) ×2 IMPLANT
GLIDESHEATH SLEND SS 6F .021 (SHEATH) ×2 IMPLANT
GUIDEWIRE INQWIRE 1.5J.035X260 (WIRE) IMPLANT
INQWIRE 1.5J .035X260CM (WIRE) ×3
PACK CARDIAC CATH (CUSTOM PROCEDURE TRAY) ×3 IMPLANT
PROTECTION STATION PRESSURIZED (MISCELLANEOUS) ×3
SET ATX SIMPLICITY (MISCELLANEOUS) ×2 IMPLANT
SHEATH AVANTI 5FR X 11CM (SHEATH) ×2 IMPLANT
STATION PROTECTION PRESSURIZED (MISCELLANEOUS) IMPLANT
WIRE GUIDERIGHT .035X150 (WIRE) ×2 IMPLANT
WIRE NITINOL .018 (WIRE) ×2 IMPLANT

## 2021-10-01 NOTE — Consult Note (Signed)
ANTICOAGULATION CONSULT NOTE  Pharmacy Consult for Heparin infusion Indication: chest pain/ACS  Patient Measurements: Height: 6\' 2"  (188 cm) Weight: 82.6 kg (182 lb) IBW/kg (Calculated) : 82.2 Heparin Dosing Weight: 82.6 kg  Labs: Recent Labs    09/30/21 1224 09/30/21 1716 09/30/21 1858 09/30/21 2002 10/01/21 0100 10/01/21 0422 10/01/21 0635 10/01/21 0758 10/01/21 0913  HGB 16.4  --   --   --   --  14.5  --   --   --   HCT 48.0  --   --   --   --  42.4  --   --   --   PLT 195  --   --   --   --  157  --   --   --   APTT  --   --  27  --   --   --   --   --   --   LABPROT  --   --  14.6  --   --  14.3  --   --   --   INR  --   --  1.1  --   --  1.1  --   --   --   HEPARINUNFRC  --   --   --   --  0.18*  --   --   --  0.44  CREATININE 0.91  --   --   --   --  0.65  --   --   --   TROPONINIHS 7   < >  --    < > 3,762* 3,220* 2,293* 2,677* 2,319*   < > = values in this interval not displayed.     Estimated Creatinine Clearance: 107 mL/min (by C-G formula based on SCr of 0.65 mg/dL).   Medical History: Past Medical History:  Diagnosis Date   Coronary artery disease    Elevated cholesterol    Pulmonary fibrosis (HCC)    Restless leg syndrome     Medications:  No prior anticoagulation noted   Assessment: 65 y.o. male complains of chest pain with associated diaphoresis. Troponin 1127. Pharmacy has been consulted for heparin infusion for ACS.   Baseline labs: CBC adequate, INR 1.1, aPTT 27s  Goal of Therapy:  Heparin level 0.3-0.7 units/ml Monitor platelets by anticoagulation protocol: Yes   Plan:  --Heparin level is therapeutic x 1 --Maintain heparin infusion at 1250 units/hr --Re-check confirmatory HL in 6 hours --Daily CBC per protocol while on IV heparin  Benita Gutter  10/01/2021 10:07 AM

## 2021-10-01 NOTE — Consult Note (Signed)
Cardiology Consult    Patient ID: Dillon [name] MRN: [id number], DOB/AGE: 1956/09/17   Admit date: 09/30/2021 Date of Consult: 10/01/2021  Primary Physician: [name], MD Primary Cardiologist: [name], MD Requesting Provider: R. Wieting, MD  Patient Profile    65 y/o ? with the above PMH including CAD, HL, remote tob abuse, pulmonary fibrosis, and restless leg syndrome, who is being seen today for the evaluation of NSTEMI at the request of Dr. Leslye Peer.  Past Medical History   Past Medical History:  Diagnosis Date   Coronary artery disease    Elevated cholesterol    Pulmonary fibrosis (HCC)    Restless leg syndrome     Past Surgical History:  Procedure Laterality Date   ANGIOPLASTY  2010   2 stents placed   BACK SURGERY     COLONOSCOPY WITH PROPOFOL N/A 03/05/2018   Procedure: COLONOSCOPY WITH PROPOFOL;  Surgeon: Virgel Manifold, MD;  Location: [facility];  Service: Endoscopy;  Laterality: N/A;   CORONARY ANGIOPLASTY     CORONARY/GRAFT ACUTE MI REVASCULARIZATION N/A 09/30/2019   Procedure: Coronary/Graft Acute MI Revascularization;  Surgeon: Wellington Hampshire, MD;  Location: [place] [facility];  Service: Cardiovascular;  Laterality: N/A;   LEFT HEART CATH AND CORONARY ANGIOGRAPHY N/A 09/30/2019   Procedure: LEFT HEART CATH AND CORONARY ANGIOGRAPHY;  Surgeon: Wellington Hampshire, MD;  Location: [facility];  Service: Cardiovascular;  Laterality: N/A;     Allergies  Allergies  Allergen Reactions   Ace Inhibitors Swelling    Causes cough and swelling    History of Present Illness    65 y/o ? with the above PMH including CAD, HL, remote tob abuse, pulmonary fibrosis, and restless leg syndrome.  He previously required distal RCA stenting.  In 09/2019, he was admitted w/ COVID-19 infection and STEMI and found to have an occluded apical LAD, which was felt to be the culprit.  He also had a rupture plaque in the prox LAD, which was felt to be  responsible for the distal emoblization and was treated w/ a DES.  The distal RCA stent was patent.    He was last seen in cardiology clinic in 01/2021, @ which time he was doing well.  He was continued on DAPT, however, in 07/2021, plavix was d/c'd secondary to epistaxis.  He was in his USOH until ~ 10:30 AM on 12/12, when while @ work, he had sudden onset of chest pain assoc w/GI upset and sensation that he had to have a BM.  He recognized chest pain as similar to prior angina.  As Ss persisted, he presented tot he ED around noon.  Initial ECG w/ subtle inferolateral ST elevation, which resolved on subsequent ECGs.  Pain eventually resolved after a total duration of ~ 3-4 hrs.  HsTrop rose from 7  1127  3194  3829  3762  3220  2293  2677  2319  1842.  Currently he is c/p free.  Inpatient Medications     [MAR Hold] aspirin EC  81 mg Oral Daily   [MAR Hold] atorvastatin  80 mg Oral q1800   [MAR Hold] carvedilol  3.125 mg Oral BID WC   [MAR Hold] Pirfenidone  3 capsule Oral TID   [MAR Hold] sodium chloride flush  3 mL Intravenous Q12H   [MAR Hold] sodium chloride flush  3 mL Intravenous Q12H    Family History    Family History  Problem Relation Age of Onset   Heart  disease Mother    Heart disease Father    He indicated that his mother is deceased. He indicated that his father is deceased. He indicated that his brother is alive.   Social History    Social History   Socioeconomic History   Marital status: Married    Spouse name: Not on file   Number of children: Not on file   Years of education: Not on file   Highest education level: Not on file  Occupational History   Not on file  Tobacco Use   Smoking status: Former    Types: Cigarettes    Quit date: 03/05/2009    Years since quitting: 12.5   Smokeless tobacco: Never  Vaping Use   Vaping Use: Never used  Substance and Sexual Activity   Alcohol use: Not Currently    Comment: occassionally - 1 beer/month or less   Drug use:  Not Currently   Sexual activity: Not Currently  Other Topics Concern   Not on file  Social History Narrative   Lives locally with wife.  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   Social Determinants of Health   Financial Resource Strain: Not on file  Food Insecurity: Not on file  Transportation Needs: Not on file  Physical Activity: Not on file  Stress: Not on file  Social Connections: Not on file  Intimate Partner Violence: Not on file     Review of Systems    General:  No chills, fever, night sweats or weight changes.  Cardiovascular:  +++ chest pain, +++ dyspnea on exertion, no edema, orthopnea, palpitations, paroxysmal nocturnal dyspnea. Dermatological: No rash, lesions/masses Respiratory: No cough, +++ dyspnea Urologic: No hematuria, dysuria Abdominal:   No nausea, vomiting, diarrhea, bright red blood per rectum, melena, or hematemesis Neurologic:  No visual changes, wkns, changes in mental status. All other systems reviewed and are otherwise negative except as noted above.  Physical Exam    Blood pressure 124/76, pulse 69, temperature 97.9 F (36.6 C), temperature source Oral, resp. rate 17, height 6\' 2"  (1.88 m), weight 82.6 kg, SpO2 95 %.  General: Pleasant, NAD Psych: Normal affect. Neuro: Alert and oriented X 3. Moves all extremities spontaneously. HEENT: Normal  Neck: Supple without bruits or JVD. Lungs:  Resp regular and unlabored, diminished breath sounds bilat Heart: RRR no s3, s4, or murmurs. Abdomen: Soft, non-tender, non-distended, BS + x 4.  Extremities: No clubbing, cyanosis or edema. DP/PT2+, Radials 2+ and equal bilaterally.  Labs    Cardiac Enzymes Recent Labs  Lab 10/01/21 0422 10/01/21 0635 10/01/21 0758 10/01/21 0913 10/01/21 1200  TROPONINIHS 3,220* 2,293* 2,677* 2,319* 1,842*      Lab Results  Component Value Date   WBC 9.1 10/01/2021   HGB 14.5 10/01/2021   HCT 42.4 10/01/2021   MCV 97.7 10/01/2021    PLT 157 10/01/2021    Recent Labs  Lab 10/01/21 0422  NA 135  K 3.7  CL 106  CO2 25  BUN 17  CREATININE 0.65  CALCIUM 8.0*  GLUCOSE 99   Lab Results  Component Value Date   CHOL 118 10/01/2021   HDL 45 10/01/2021   LDLCALC 64 10/01/2021   TRIG 45 10/01/2021     Radiology Studies    DG Chest 2 View  Result Date: 09/30/2021 CLINICAL DATA:  Chest pain. EXAM: CHEST - 2 VIEW COMPARISON:  08/25/2019 FINDINGS: Lungs are hyperexpanded. The lungs are clear without focal pneumonia, edema, pneumothorax or pleural  effusion. Chronic interstitial lung disease is similar to prior. The cardiopericardial silhouette is within normal limits for size. The visualized bony structures of the thorax show no acute abnormality. IMPRESSION: Hyperexpansion with chronic interstitial lung disease. No acute cardiopulmonary findings. Electronically Signed   By: Misty Stanley M.D.   On: 09/30/2021 12:41    ECG & Cardiac Imaging    RSR, 68, subtle inferolateral ST elev - personally reviewed - improved on subsequent ECGs.  Assessment & Plan    1.  NSTEMI/CAD:  s/p PCI of the RCA @ [facility]~ 10 yrs ago w/ NSTEMI in 09/2019 in the setting of COVID w/ finding of patent RCA stents and sev thrombotic prox LAD dzs (treated w/ DES), and apical LAD occlusion (med rx).  Developed c/p that he identified as similar to prior angina on 12/12, prompting him to present to ED where he ruled in w/ HsTrop trend of 7   1127  3194  3829   3762   3220   2293   2677   2319   1842.  Currently c/p free on heparin.  Plan for cath today. The patient understands that risks include but are not limited to stroke (1 in 1000), death (1 in 74), kidney failure [usually temporary] (1 in 500), bleeding (1 in 200), allergic reaction [possibly serious] (1 in 200), and agrees to proceed. .  Cont asa, statin, ? blocker.  Of note, plavix recently d/c'd 2/2 epistaxis.  2.  HL:  cont high potency statin rx.  LDL 64.  Will add zetia given high risk w/  goal LDL <55.  Signed, Murray Hodgkins, NP 10/01/2021, 2:13 PM  For questions or updates, please contact   Please consult [email or web address] for contact info under Cardiology/STEMI.

## 2021-10-01 NOTE — Interval H&P Note (Signed)
History and Physical Interval Note:  10/01/2021 2:47 PM  Dillon [name]  has presented today for surgery, with the diagnosis of NSTEMI.  The various methods of treatment have been discussed with the patient and family. After consideration of risks, benefits and other options for treatment, the patient has consented to  Procedure(s): LEFT HEART CATH AND CORS/GRAFTS ANGIOGRAPHY (N/A) as a surgical intervention.  The patient's history has been reviewed, patient examined, no change in status, stable for surgery.  I have reviewed the patient's chart and labs.  Questions were answered to the patient's satisfaction.    Cath Lab Visit (complete for each Cath Lab visit)  Clinical Evaluation Leading to the Procedure:   ACS: Yes.    Non-ACS:  N/A  Jaquane Boughner

## 2021-10-01 NOTE — Progress Notes (Signed)
Patient ID: Dillon [name], male   DOB: 02/29/1956, 65 y.o.   MRN: [id number] [facility] PROGRESS NOTE  Dillon [name] RCV:[id number] DOB: [birth year] DOA: 09/30/2021 PCP: [name], MD  HPI/Subjective: Patient seen this morning.  He was chest pain-free when I saw him.  Admitted with chest pain that radiated both arms.  Described as 8-9 out of 10 in intensity.  He always has a little shortness of breath but no worsening shortness of breath with episode.  He did have sweating.  No nausea or vomiting.  Troponin peaked at 3829.  Objective: Vitals:   10/01/21 1105 10/01/21 1315  BP: 110/69 124/76  Pulse: 70 69  Resp: (!) 21 17  Temp:  97.9 F (36.6 C)  SpO2: 92% 95%   No intake or output data in the 24 hours ending 10/01/21 1318 Filed Weights   09/30/21 1219  Weight: 82.6 kg    ROS: Review of Systems  Respiratory:  Positive for shortness of breath. Negative for cough.   Cardiovascular:  Positive for chest pain.  Gastrointestinal:  Negative for abdominal pain, nausea and vomiting.  Exam: Physical Exam HENT:     Head: Normocephalic.     Mouth/Throat:     Pharynx: No oropharyngeal exudate.  Eyes:     General: Lids are normal.     Conjunctiva/sclera: Conjunctivae normal.  Cardiovascular:     Rate and Rhythm: Normal rate and regular rhythm.     Heart sounds: Normal heart sounds, S1 normal and S2 normal.  Pulmonary:     Breath sounds: No decreased breath sounds, wheezing, rhonchi or rales.  Abdominal:     Palpations: Abdomen is soft.     Tenderness: There is no abdominal tenderness.  Musculoskeletal:     Right lower leg: No swelling.     Left lower leg: No swelling.  Skin:    General: Skin is warm.     Findings: No rash.  Neurological:     Mental Status: He is alert and oriented to person, place, and time.      Scheduled Meds:  [MAR Hold] aspirin EC  81 mg Oral Daily   [MAR Hold] atorvastatin  80 mg Oral q1800   [MAR Hold] carvedilol  3.125 mg Oral BID WC    [MAR Hold] Pirfenidone  3 capsule Oral TID   [MAR Hold] sodium chloride flush  3 mL Intravenous Q12H   [MAR Hold] sodium chloride flush  3 mL Intravenous Q12H   Continuous Infusions:  [MAR Hold] sodium chloride     sodium chloride     sodium chloride     Followed by   sodium chloride     heparin 1,250 Units/hr (10/01/21 1209)   Brief history 65 year old man with history of CAD, essential hypertension hyperlipidemia pulmonary fibrosis presented with chest pain.  Troponin peaked at 3829.  Patient had a stent to the mid LAD on 10/01/2021.  Assessment/Plan:  NSTEMI.  I saw the patient earlier this morning and contacted cardiology.  This morning was on heparin drip, aspirin and atorvastatin and Coreg.  Patient had occluded LAD on cardiac cath and had a successful stent.  Dual antiplatelet therapy recommended for 1 year.  LDL 64. Pulmonary fibrosis on pirfenidone 3 times a day Essential hypertension on low-dose Coreg Hyperlipidemia unspecified on Lipitor.  LDL 64. Impaired fasting glucose.  Hemoglobin A1c pending COVID-negative.  Code Status:     Code Status Orders  (From admission, onward)  Start     Ordered   09/30/21 1947  Full code  Continuous        09/30/21 1946           Code Status History     Date Active Date Inactive Code Status Order ID Comments User Context   06/11/2020 0427 06/12/2020 1356 Full Code [id number]  Athena Masse, MD ED   09/30/2019 1632 10/05/2019 1908 Full Code [id number]  Wellington Hampshire, MD Inpatient      Family Communication: Daughter at bedside Disposition Plan: Status is: Inpatient  Consultants: Cardiology  Procedures: Cardiac catheterization  Time spent: 28 minutes  [place]

## 2021-10-01 NOTE — Brief Op Note (Signed)
BRIEF CARDIAC CATHETERIZATION NOTE  DATE: 10/01/2021  TIME: 4:26 PM  PATIENT:  [name]  66 y.o. male  PRE-OPERATIVE DIAGNOSIS:  NSTEMI  POST-OPERATIVE DIAGNOSIS:  NSTEMI  PROCEDURE:  Procedure(s): LEFT HEART CATH AND CORS/GRAFTS ANGIOGRAPHY (N/A)  SURGEON:  Surgeon(s) and Role:    * Kahlie Deutscher, Harrell Gave, MD - Primary  FINDINGS: Non-obstructive CAD.  No culprit lesion for NSTEMI. Patent LAD and RCA stents. Preserved LVEF with focal apical akinesis.  Normal LVEDP.  RECOMMENDATIONS: Medical therapy; will add isosorbide mononitrate for possible spasm. Start clopidogrel to continue DAPT for up to 12 months, as tolerated. Anticipate d/c home tomorrow if no post-catheterization complications.  Nelva Bush, MD [facility]

## 2021-10-01 NOTE — H&P (View-Only) (Signed)
Cardiology Consult    Patient ID: Dillon [name] MRN: [id number], DOB/AGE: 65-Feb-1957   Admit date: 09/30/2021 Date of Consult: 10/01/2021  Primary Physician: [name], MD Primary Cardiologist: [name], MD Requesting Provider: R. Wieting, MD  Patient Profile    65 y/o ? with the above PMH including CAD, HL, remote tob abuse, pulmonary fibrosis, and restless leg syndrome, who is being seen today for the evaluation of NSTEMI at the request of Dr. Leslye Peer.  Past Medical History   Past Medical History:  Diagnosis Date   Coronary artery disease    Elevated cholesterol    Pulmonary fibrosis (HCC)    Restless leg syndrome     Past Surgical History:  Procedure Laterality Date   ANGIOPLASTY  2010   2 stents placed   BACK SURGERY     COLONOSCOPY WITH PROPOFOL N/A 03/05/2018   Procedure: COLONOSCOPY WITH PROPOFOL;  Surgeon: Virgel Manifold, MD;  Location: [facility];  Service: Endoscopy;  Laterality: N/A;   CORONARY ANGIOPLASTY     CORONARY/GRAFT ACUTE MI REVASCULARIZATION N/A 09/30/2019   Procedure: Coronary/Graft Acute MI Revascularization;  Surgeon: Wellington Hampshire, MD;  Location: [facility];  Service: Cardiovascular;  Laterality: N/A;   LEFT HEART CATH AND CORONARY ANGIOGRAPHY N/A 09/30/2019   Procedure: LEFT HEART CATH AND CORONARY ANGIOGRAPHY;  Surgeon: Wellington Hampshire, MD;  Location: [facility];  Service: Cardiovascular;  Laterality: N/A;     Allergies  Allergies  Allergen Reactions   Ace Inhibitors Swelling    Causes cough and swelling    History of Present Illness    65 y/o ? with the above PMH including CAD, HL, remote tob abuse, pulmonary fibrosis, and restless leg syndrome.  He previously required distal RCA stenting.  In 09/2019, he was admitted w/ COVID-19 infection and STEMI and found to have an occluded apical LAD, which was felt to be the culprit.  He also had a rupture plaque in the prox LAD, which was felt to be  responsible for the distal emoblization and was treated w/ a DES.  The distal RCA stent was patent.    He was last seen in cardiology clinic in 01/2021, @ which time he was doing well.  He was continued on DAPT, however, in 07/2021, plavix was d/c'd secondary to epistaxis.  He was in his USOH until ~ 10:30 AM on 12/12, when while @ work, he had sudden onset of chest pain assoc w/GI upset and sensation that he had to have a BM.  He recognized chest pain as similar to prior angina.  As Ss persisted, he presented tot he ED around noon.  Initial ECG w/ subtle inferolateral ST elevation, which resolved on subsequent ECGs.  Pain eventually resolved after a total duration of ~ 3-4 hrs.  HsTrop rose from 7  1127  3194  3829  3762  3220  2293  2677  2319  1842.  Currently he is c/p free.  Inpatient Medications     [MAR Hold] aspirin EC  81 mg Oral Daily   [MAR Hold] atorvastatin  80 mg Oral q1800   [MAR Hold] carvedilol  3.125 mg Oral BID WC   [MAR Hold] Pirfenidone  3 capsule Oral TID   [MAR Hold] sodium chloride flush  3 mL Intravenous Q12H   [MAR Hold] sodium chloride flush  3 mL Intravenous Q12H    Family History    Family History  Problem Relation Age of Onset   Heart  disease Mother    Heart disease Father    He indicated that his mother is deceased. He indicated that his father is deceased. He indicated that his brother is alive.   Social History    Social History   Socioeconomic History   Marital status: Married    Spouse name: Not on file   Number of children: Not on file   Years of education: Not on file   Highest education level: Not on file  Occupational History   Not on file  Tobacco Use   Smoking status: Former    Types: Cigarettes    Quit date: 03/05/2009    Years since quitting: 12.5   Smokeless tobacco: Never  Vaping Use   Vaping Use: Never used  Substance and Sexual Activity   Alcohol use: Not Currently    Comment: occassionally - 1 beer/month or less   Drug use:  Not Currently   Sexual activity: Not Currently  Other Topics Concern   Not on file  Social History Narrative   Lives locally with wife.  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   Social Determinants of Health   Financial Resource Strain: Not on file  Food Insecurity: Not on file  Transportation Needs: Not on file  Physical Activity: Not on file  Stress: Not on file  Social Connections: Not on file  Intimate Partner Violence: Not on file     Review of Systems    General:  No chills, fever, night sweats or weight changes.  Cardiovascular:  +++ chest pain, +++ dyspnea on exertion, no edema, orthopnea, palpitations, paroxysmal nocturnal dyspnea. Dermatological: No rash, lesions/masses Respiratory: No cough, +++ dyspnea Urologic: No hematuria, dysuria Abdominal:   No nausea, vomiting, diarrhea, bright red blood per rectum, melena, or hematemesis Neurologic:  No visual changes, wkns, changes in mental status. All other systems reviewed and are otherwise negative except as noted above.  Physical Exam    Blood pressure 124/76, pulse 69, temperature 97.9 F (36.6 C), temperature source Oral, resp. rate 17, height 6\' 2"  (1.88 m), weight 82.6 kg, SpO2 95 %.  General: Pleasant, NAD Psych: Normal affect. Neuro: Alert and oriented X 3. Moves all extremities spontaneously. HEENT: Normal  Neck: Supple without bruits or JVD. Lungs:  Resp regular and unlabored, diminished breath sounds bilat Heart: RRR no s3, s4, or murmurs. Abdomen: Soft, non-tender, non-distended, BS + x 4.  Extremities: No clubbing, cyanosis or edema. DP/PT2+, Radials 2+ and equal bilaterally.  Labs    Cardiac Enzymes Recent Labs  Lab 10/01/21 0422 10/01/21 0635 10/01/21 0758 10/01/21 0913 10/01/21 1200  TROPONINIHS 3,220* 2,293* 2,677* 2,319* 1,842*      Lab Results  Component Value Date   WBC 9.1 10/01/2021   HGB 14.5 10/01/2021   HCT 42.4 10/01/2021   MCV 97.7 10/01/2021    PLT 157 10/01/2021    Recent Labs  Lab 10/01/21 0422  NA 135  K 3.7  CL 106  CO2 25  BUN 17  CREATININE 0.65  CALCIUM 8.0*  GLUCOSE 99   Lab Results  Component Value Date   CHOL 118 10/01/2021   HDL 45 10/01/2021   LDLCALC 64 10/01/2021   TRIG 45 10/01/2021     Radiology Studies    DG Chest 2 View  Result Date: 09/30/2021 CLINICAL DATA:  Chest pain. EXAM: CHEST - 2 VIEW COMPARISON:  08/25/2019 FINDINGS: Lungs are hyperexpanded. The lungs are clear without focal pneumonia, edema, pneumothorax or pleural  effusion. Chronic interstitial lung disease is similar to prior. The cardiopericardial silhouette is within normal limits for size. The visualized bony structures of the thorax show no acute abnormality. IMPRESSION: Hyperexpansion with chronic interstitial lung disease. No acute cardiopulmonary findings. Electronically Signed   By: Misty Stanley M.D.   On: 09/30/2021 12:41    ECG & Cardiac Imaging    RSR, 68, subtle inferolateral ST elev - personally reviewed - improved on subsequent ECGs.  Assessment & Plan    1.  NSTEMI/CAD:  s/p PCI of the RCA @ [facility]~ 10 yrs ago w/ NSTEMI in 09/2019 in the setting of COVID w/ finding of patent RCA stents and sev thrombotic prox LAD dzs (treated w/ DES), and apical LAD occlusion (med rx).  Developed c/p that he identified as similar to prior angina on 12/12, prompting him to present to ED where he ruled in w/ HsTrop trend of 7   1127  3194  3829   3762   3220   2293   2677   2319   1842.  Currently c/p free on heparin.  Plan for cath today. The patient understands that risks include but are not limited to stroke (1 in 1000), death (1 in 5), kidney failure [usually temporary] (1 in 500), bleeding (1 in 200), allergic reaction [possibly serious] (1 in 200), and agrees to proceed. .  Cont asa, statin, ? blocker.  Of note, plavix recently d/c'd 2/2 epistaxis.  2.  HL:  cont high potency statin rx.  LDL 64.  Will add zetia given high risk w/  goal LDL <55.  Signed, Murray Hodgkins, NP 10/01/2021, 2:13 PM  For questions or updates, please contact   Please consult [email or web address] for contact info under Cardiology/STEMI.

## 2021-10-01 NOTE — Consult Note (Signed)
ANTICOAGULATION CONSULT NOTE  Pharmacy Consult for Heparin infusion Indication: chest pain/ACS  Allergies  Allergen Reactions   Ace Inhibitors Swelling    Causes cough and swelling    Patient Measurements: Height: 6\' 2"  (188 cm) Weight: 82.6 kg (182 lb) IBW/kg (Calculated) : 82.2 Heparin Dosing Weight: 82.6 kg  Vital Signs: Temp: 98.1 F (36.7 C) (12/12 2200) BP: 132/83 (12/13 0100) Pulse Rate: 64 (12/13 0100)  Labs: Recent Labs    09/30/21 1224 09/30/21 1716 09/30/21 1858 09/30/21 2002 09/30/21 2210 10/01/21 0100  HGB 16.4  --   --   --   --   --   HCT 48.0  --   --   --   --   --   PLT 195  --   --   --   --   --   APTT  --   --  27  --   --   --   LABPROT  --   --  14.6  --   --   --   INR  --   --  1.1  --   --   --   HEPARINUNFRC  --   --   --   --   --  0.18*  CREATININE 0.91  --   --   --   --   --   TROPONINIHS 7   < >  --  3,194* 3,829* 3,762*   < > = values in this interval not displayed.     Estimated Creatinine Clearance: 94.1 mL/min (by C-G formula based on SCr of 0.91 mg/dL).   Medical History: Past Medical History:  Diagnosis Date   Coronary artery disease    Elevated cholesterol    Restless leg syndrome     Medications:  No Prior anticoagulation noted   Assessment:  65 y.o. male complains of chest pain with associated diaphoresis. Troponin 1127. Pharmacy has been consulted for heparin infusion for ACS.   Baseline labs: CBC adequate, INR 1.1, aPTT 27s Goal of Therapy:  Heparin level 0.3-0.7 units/ml Monitor platelets by anticoagulation protocol: Yes  12/13 0100 HL 0.18   Plan:  Give 2500 units bolus x 1 Increase heparin infusion to 1250 units/hr Check HL in 6 hrs after rate change CBC daily while on heparin.  Renda Rolls, PharmD, MBA 10/01/2021 2:30 AM

## 2021-10-02 ENCOUNTER — Encounter: Payer: Self-pay | Admitting: Internal Medicine

## 2021-10-02 DIAGNOSIS — I2583 Coronary atherosclerosis due to lipid rich plaque: Secondary | ICD-10-CM

## 2021-10-02 LAB — HEMOGLOBIN A1C
Hgb A1c MFr Bld: 5.7 % — ABNORMAL HIGH (ref 4.8–5.6)
Mean Plasma Glucose: 117 mg/dL

## 2021-10-02 LAB — CBC
HCT: 40.6 % (ref 39.0–52.0)
Hemoglobin: 13.6 g/dL (ref 13.0–17.0)
MCH: 33.4 pg (ref 26.0–34.0)
MCHC: 33.5 g/dL (ref 30.0–36.0)
MCV: 99.8 fL (ref 80.0–100.0)
Platelets: 153 10*3/uL (ref 150–400)
RBC: 4.07 MIL/uL — ABNORMAL LOW (ref 4.22–5.81)
RDW: 13.4 % (ref 11.5–15.5)
WBC: 7.4 10*3/uL (ref 4.0–10.5)
nRBC: 0 % (ref 0.0–0.2)

## 2021-10-02 LAB — BASIC METABOLIC PANEL
Anion gap: 4 — ABNORMAL LOW (ref 5–15)
BUN: 13 mg/dL (ref 8–23)
CO2: 28 mmol/L (ref 22–32)
Calcium: 8.5 mg/dL — ABNORMAL LOW (ref 8.9–10.3)
Chloride: 102 mmol/L (ref 98–111)
Creatinine, Ser: 0.64 mg/dL (ref 0.61–1.24)
GFR, Estimated: >60 mL/min (ref >60)
Glucose, Bld: 106 mg/dL — ABNORMAL HIGH (ref 70–99)
Potassium: 4.6 mmol/L (ref 3.5–5.1)
Sodium: 134 mmol/L — ABNORMAL LOW (ref 135–145)

## 2021-10-02 MED ORDER — CARVEDILOL 3.125 MG PO TABS: 3.1250 mg | ORAL_TABLET | Freq: Two times a day (BID) | ORAL | Status: DC

## 2021-10-02 MED ORDER — EZETIMIBE 10 MG PO TABS: 10.0000 mg | ORAL_TABLET | Freq: Every day | ORAL | 2 refills | Status: DC

## 2021-10-02 MED ORDER — NITROGLYCERIN 0.4 MG SL SUBL
0.4000 mg | SUBLINGUAL_TABLET | SUBLINGUAL | 12 refills | Status: DC | PRN
Start: 2021-10-02 — End: 2023-11-10

## 2021-10-02 MED ORDER — CLOPIDOGREL BISULFATE 75 MG PO TABS: 75.0000 mg | ORAL_TABLET | Freq: Every day | ORAL | 3 refills | Status: DC

## 2021-10-02 MED ORDER — ISOSORBIDE MONONITRATE ER 30 MG PO TB24: 15.0000 mg | ORAL_TABLET | Freq: Every day | ORAL | 2 refills | Status: DC

## 2021-10-02 NOTE — Discharge Summary (Signed)
[facility] NAME: Dillon [name]    MR#:  [id number]  DATE OF BIRTH:  [birth year]  DATE OF ADMISSION:  09/30/2021 ADMITTING PHYSICIAN: Loletha Grayer, MD  DATE OF DISCHARGE: 10/02/2021  PRIMARY CARE PHYSICIAN: [name], MD    ADMISSION DIAGNOSIS:  Elevated troponin [R77.8] NSTEMI (non-ST elevated myocardial infarction) ([place]) [I21.4] Chest pain, unspecified type [R07.9]  DISCHARGE DIAGNOSIS:  Acute NSTEMI s/p cath with patent stents CAD  SECONDARY DIAGNOSIS:   Past Medical History:  Diagnosis Date   Coronary artery disease    Elevated cholesterol    Pulmonary fibrosis (HCC)    Restless leg syndrome     HOSPITAL COURSE:  Patient is a 65 year old man with history of CAD status post PCI's to the LAD and RCA, hyperlipidemia, and pulmonary fibrosis, who came in  due to chest pain and found to have elevated troponin   NSTEMI -- pt was on IV heparin drip, aspirin and atorvastatin and Coreg.  -- s/p cardiac cath per dr End--Nonobstructive coronary artery disease with patent stents in the proximal LAD and distal RCA/RPDA.  No culprit lesion identified to explain patient's chest pain and elevated troponin (MINOCA). Preserved left ventricular systolic function with focal apical akinesis.  LVEF 55-65% with normal filling pressure. Tortuous right radial artery with vasospasm precluding sheath placement.  Consider alternative access for further catheterizations. --cardiology recommends imdur and plavix (12 months) --cont asa, cont coreg   Pulmonary fibrosis on pirfenidone 3 times a day  Essential hypertension on low-dose Coreg  Hyperlipidemia unspecified on Lipitor.  LDL 64. Cont statins and zetia  Impaired fasting glucose.  Hemoglobin A1c 5.7%  COVID-negative.    Patient overall hemodynamically stable. Discussed with cardiology Dr. Garen Lah. Okay to go home with outpatient follow-up with cardiology. Patient is in  agreement with plan.  CONSULTS OBTAINED:  Treatment Team:  Nelva Bush, MD  DRUG ALLERGIES:   Allergies  Allergen Reactions   Ace Inhibitors Swelling    Causes cough and swelling    DISCHARGE MEDICATIONS:   Allergies as of 10/02/2021       Reactions   Ace Inhibitors Swelling   Causes cough and swelling        Medication List     STOP taking these medications    albuterol 108 (90 Base) MCG/ACT inhaler Commonly known as: VENTOLIN HFA   Hydromet 5-1.5 MG/5ML syrup Generic drug: HYDROcodone bit-homatropine   triamcinolone ointment 0.1 % Commonly known as: KENALOG       TAKE these medications    acetaminophen 500 MG tablet Commonly known as: TYLENOL Take 1,000 mg by mouth every 8 (eight) hours as needed for moderate pain.   ALPHA LIPOIC ACID PO Take 500-1,000 mg by mouth daily.   aspirin EC 81 MG tablet Take 81 mg by mouth daily. Swallow whole.   atorvastatin 40 MG tablet Commonly known as: LIPITOR TAKE 1 TABLET BY MOUTH EVERY DAY   carvedilol 3.125 MG tablet Commonly known as: COREG TAKE 1 TABLET (3.125 MG TOTAL) BY MOUTH 2 (TWO) TIMES DAILY WITH A MEAL.   clopidogrel 75 MG tablet Commonly known as: PLAVIX Take 1 tablet (75 mg total) by mouth daily with breakfast. Start taking on: October 03, 2021   Esbriet 267 MG Caps Generic drug: Pirfenidone Take 3 capsules by mouth 3 (three) times daily.   ezetimibe 10 MG tablet Commonly known as: ZETIA Take 1 tablet (10 mg total) by mouth daily. Start taking on: October 03, 2021  isosorbide mononitrate 30 MG 24 hr tablet Commonly known as: IMDUR Take 0.5 tablets (15 mg total) by mouth daily. Start taking on: October 03, 2021   nitroGLYCERIN 0.4 MG SL tablet Commonly known as: NITROSTAT Place 1 tablet (0.4 mg total) under the tongue every 5 (five) minutes x 3 doses as needed for chest pain. What changed:  medication strength how much to take when to take this   Trelegy Ellipta  100-62.5-25 MCG/ACT Aepb Generic drug: Fluticasone-Umeclidin-Vilant Inhale 1 puff into the lungs daily.        If you experience worsening of your admission symptoms, develop shortness of breath, life threatening emergency, suicidal or homicidal thoughts you must seek medical attention immediately by calling 911 or calling your MD immediately  if symptoms less severe.  You Must read complete instructions/literature along with all the possible adverse reactions/side effects for all the Medicines you take and that have been prescribed to you. Take any new Medicines after you have completely understood and accept all the possible adverse reactions/side effects.   Please note  You were cared for by a hospitalist during your hospital stay. If you have any questions about your discharge medications or the care you received while you were in the hospital after you are discharged, you can call the unit and asked to speak with the hospitalist on call if the hospitalist that took care of you is not available. Once you are discharged, your primary care physician will handle any further medical issues. Please note that NO REFILLS for any discharge medications will be authorized once you are discharged, as it is imperative that you return to your primary care physician (or establish a relationship with a primary care physician if you do not have one) for your aftercare needs so that they can reassess your need for medications and monitor your lab values. Today   SUBJECTIVE  No cp or sob   VITAL SIGNS:  Blood pressure (!) 109/57, pulse 66, temperature 97.9 F (36.6 C), temperature source Oral, resp. rate 18, height 6\' 2"  (1.88 m), weight 80.1 kg, SpO2 94 %.  I/O:   Intake/Output Summary (Last 24 hours) at 10/02/2021 0945 Last data filed at 10/02/2021 0600 Gross per 24 hour  Intake 492.74 ml  Output 850 ml  Net -357.26 ml    PHYSICAL EXAMINATION:  GENERAL:  65 y.o.-year-old patient lying in the  bed with no acute distress.  LUNGS: Normal breath sounds bilaterally, no wheezing, rales,rhonchi or crepitation. No use of accessory muscles of respiration.  CARDIOVASCULAR: S1, S2 normal. No murmurs, rubs, or gallops.  ABDOMEN: Soft, non-tender, non-distended. Bowel sounds present. No organomegaly or mass.  EXTREMITIES: No pedal edema, cyanosis, or clubbing.  NEUROLOGIC: non-focal PSYCHIATRIC:  patient is alert and oriented x 3.  SKIN: No obvious rash, lesion, or ulcer.   DATA REVIEW:   CBC  Recent Labs  Lab 10/02/21 0709  WBC 7.4  HGB 13.6  HCT 40.6  PLT 153    Chemistries  Recent Labs  Lab 10/02/21 0709  NA 134*  K 4.6  CL 102  CO2 28  GLUCOSE 106*  BUN 13  CREATININE 0.64  CALCIUM 8.5*    Microbiology Results   Recent Results (from the past 240 hour(s))  Resp Panel by RT-PCR (Flu A&B, Covid) Nasopharyngeal Swab     Status: None   Collection Time: 09/30/21  7:20 PM   Specimen: Nasopharyngeal Swab; Nasopharyngeal(NP) swabs in vial transport medium  Result Value Ref Range Status  SARS Coronavirus 2 by RT PCR NEGATIVE NEGATIVE Final    Comment: (NOTE) SARS-CoV-2 target nucleic acids are NOT DETECTED.  The SARS-CoV-2 RNA is generally detectable in upper respiratory specimens during the acute phase of infection. The lowest concentration of SARS-CoV-2 viral copies this assay can detect is 138 copies/mL. A negative result does not preclude SARS-Cov-2 infection and should not be used as the sole basis for treatment or other patient management decisions. A negative result may occur with  improper specimen collection/handling, submission of specimen other than nasopharyngeal swab, presence of viral mutation(s) within the areas targeted by this assay, and inadequate number of viral copies(<138 copies/mL). A negative result must be combined with clinical observations, patient history, and epidemiological information. The expected result is Negative.  Fact Sheet for  Patients:  EntrepreneurPulse.com.au  Fact Sheet for Healthcare Providers:  IncredibleEmployment.be  This test is no t yet approved or cleared by the [place] FDA and  has been authorized for detection and/or diagnosis of SARS-CoV-2 by FDA under an Emergency Use Authorization (EUA). This EUA will remain  in effect (meaning this test can be used) for the duration of the COVID-19 declaration under Section 564(b)(1) of the Act, 21 U.S.C.section 360bbb-3(b)(1), unless the authorization is terminated  or revoked sooner.       Influenza A by PCR NEGATIVE NEGATIVE Final   Influenza B by PCR NEGATIVE NEGATIVE Final    Comment: (NOTE) The Xpert Xpress SARS-CoV-2/FLU/RSV plus assay is intended as an aid in the diagnosis of influenza from Nasopharyngeal swab specimens and should not be used as a sole basis for treatment. Nasal washings and aspirates are unacceptable for Xpert Xpress SARS-CoV-2/FLU/RSV testing.  Fact Sheet for Patients: EntrepreneurPulse.com.au  Fact Sheet for Healthcare Providers: IncredibleEmployment.be  This test is not yet approved or cleared by the [place] FDA and has been authorized for detection and/or diagnosis of SARS-CoV-2 by FDA under an Emergency Use Authorization (EUA). This EUA will remain in effect (meaning this test can be used) for the duration of the COVID-19 declaration under Section 564(b)(1) of the Act, 21 U.S.C. section 360bbb-3(b)(1), unless the authorization is terminated or revoked.  Performed at [facility], [address], [place], [place] [address]     RADIOLOGY:  DG Chest 2 View  Result Date: 09/30/2021 CLINICAL DATA:  Chest pain. EXAM: CHEST - 2 VIEW COMPARISON:  08/25/2019 FINDINGS: Lungs are hyperexpanded. The lungs are clear without focal pneumonia, edema, pneumothorax or pleural effusion. Chronic interstitial lung disease is similar to  prior. The cardiopericardial silhouette is within normal limits for size. The visualized bony structures of the thorax show no acute abnormality. IMPRESSION: Hyperexpansion with chronic interstitial lung disease. No acute cardiopulmonary findings. Electronically Signed   By: Misty Stanley M.D.   On: 09/30/2021 12:41   CARDIAC CATHETERIZATION  Result Date: 10/01/2021 Conclusions: Nonobstructive coronary artery disease with patent stents in the proximal LAD and distal RCA/RPDA.  No culprit lesion identified to explain patient's chest pain and elevated troponin (MINOCA). Preserved left ventricular systolic function with focal apical akinesis.  LVEF 55-65% with normal filling pressure. Tortuous right radial artery with vasospasm precluding sheath placement.  Consider alternative access for further catheterizations. Recommendations: Dual antiplatelet therapy with aspirin and clopidogrel for up to 12 months, as tolerated (patient previously had to discontinue clopidogrel due to epistaxis). Aggressive secondary prevention of coronary artery disease. Escalate antianginal therapy with addition of isosorbide mononitrate 15 mg daily. Anticipate discharge home tomorrow if no further chest pain or post  catheterization complications. Nelva Bush, MD [facility]    CODE STATUS:     Code Status Orders  (From admission, onward)           Start     Ordered   09/30/21 1947  Full code  Continuous        09/30/21 1946           Code Status History     Date Active Date Inactive Code Status Order ID Comments User Context   06/11/2020 0427 06/12/2020 1356 Full Code [id number]  Athena Masse, MD ED   09/30/2019 1632 10/05/2019 1908 Full Code [id number]  Wellington Hampshire, MD Inpatient        TOTAL TIME TAKING CARE OF THIS PATIENT: 40 minutes.    Fritzi Mandes M.D  [facility]    CC: Primary care physician; [name], MD

## 2021-10-02 NOTE — Progress Notes (Signed)
°  Transition of Care [facility]) Screening Note   Patient Details  Name: Dillon [name] Date of Birth: 04-27-1956   Transition of Care [facility]) CM/SW Contact:    Alberteen Sam, LCSW Phone Number: 10/02/2021, 9:54 AM    Transition of Care Department [facility]) has reviewed patient and no TOC needs have been identified at this time. We will continue to monitor patient advancement through interdisciplinary progression rounds. If new patient transition needs arise, please place a TOC consult.

## 2021-10-02 NOTE — Progress Notes (Signed)
Progress Note  Patient Name: Dillon [name] Date of Encounter: 10/02/2021  [facility] Cardiologist: [name], MD   Subjective   Feels well, denies chest pain or shortness of breath.  Ready to go home.  Inpatient Medications    Scheduled Meds:  aspirin EC  81 mg Oral Daily   atorvastatin  80 mg Oral q1800   carvedilol  3.125 mg Oral BID WC   clopidogrel  75 mg Oral Q breakfast   enoxaparin (LOVENOX) injection  40 mg Subcutaneous Daily   ezetimibe  10 mg Oral Daily   fluticasone furoate-vilanterol  1 puff Inhalation Daily   And   umeclidinium bromide  1 puff Inhalation Daily   isosorbide mononitrate  15 mg Oral Daily   Pirfenidone  3 capsule Oral TID   sodium chloride flush  3 mL Intravenous Q12H   sodium chloride flush  3 mL Intravenous Q12H   sodium chloride flush  3 mL Intravenous Q12H   Continuous Infusions:  sodium chloride     sodium chloride     PRN Meds: sodium chloride, sodium chloride, acetaminophen, nitroGLYCERIN, ondansetron (ZOFRAN) IV, sodium chloride flush, sodium chloride flush   Vital Signs    Vitals:   10/01/21 1946 10/02/21 0009 10/02/21 0514 10/02/21 0900  BP: 107/67 (!) 100/58 (!) 109/57 112/64  Pulse: 72 68 66   Resp: 18 18 18 18   Temp: 98.1 F (36.7 C) 98.2 F (36.8 C) 97.9 F (36.6 C) 97.8 F (36.6 C)  TempSrc: Oral Oral Oral Oral  SpO2: 94% 96% 94% 95%  Weight:   80.1 kg   Height:        Intake/Output Summary (Last 24 hours) at 10/02/2021 1555 Last data filed at 10/02/2021 1013 Gross per 24 hour  Intake 483 ml  Output 850 ml  Net -367 ml   Last 3 Weights 10/02/2021 09/30/2021 08/12/2021  Weight (lbs) 176 lb 9.4 oz 182 lb 174 lb 6.4 oz  Weight (kg) 80.1 kg 82.555 kg 79.107 kg      Telemetry    Sinus rhythm- Personally Reviewed  ECG     - Personally Reviewed  Physical Exam   GEN: No acute distress.   Neck: No JVD Cardiac: RRR, no murmurs, rubs, or gallops.  Respiratory: Clear to auscultation  bilaterally. GI: Soft, nontender, non-distended  MS: No edema; No deformity. Neuro:  Nonfocal  Psych: Normal affect   Labs    High Sensitivity Troponin:   Recent Labs  Lab 10/01/21 0422 10/01/21 0635 10/01/21 0758 10/01/21 0913 10/01/21 1200  TROPONINIHS 3,220* 2,293* 2,677* 2,319* 1,842*     Chemistry Recent Labs  Lab 09/30/21 1224 10/01/21 0422 10/02/21 0709  NA 133* 135 134*  K 4.0 3.7 4.6  CL 102 106 102  CO2 26 25 28   GLUCOSE 142* 99 106*  BUN 21 17 13   CREATININE 0.91 0.65 0.64  CALCIUM 9.0 8.0* 8.5*  GFRNONAA >60 >60 >60  ANIONGAP 5 4* 4*    Lipids  Recent Labs  Lab 10/01/21 0422  CHOL 118  TRIG 45  HDL 45  LDLCALC 64  CHOLHDL 2.6    Hematology Recent Labs  Lab 09/30/21 1224 10/01/21 0422 10/02/21 0709  WBC 11.3* 9.1 7.4  RBC 4.86 4.34 4.07*  HGB 16.4 14.5 13.6  HCT 48.0 42.4 40.6  MCV 98.8 97.7 99.8  MCH 33.7 33.4 33.4  MCHC 34.2 34.2 33.5  RDW 13.4 13.5 13.4  PLT 195 157 153   Thyroid  Recent Labs  Lab 09/30/21 2002  TSH 2.430  FREET4 0.74    BNPNo results for input(s): BNP, PROBNP in the last 168 hours.  DDimer No results for input(s): DDIMER in the last 168 hours.   Radiology    CARDIAC CATHETERIZATION  Result Date: 10/01/2021 Conclusions: Nonobstructive coronary artery disease with patent stents in the proximal LAD and distal RCA/RPDA.  No culprit lesion identified to explain patient's chest pain and elevated troponin (MINOCA). Preserved left ventricular systolic function with focal apical akinesis.  LVEF 55-65% with normal filling pressure. Tortuous right radial artery with vasospasm precluding sheath placement.  Consider alternative access for further catheterizations. Recommendations: Dual antiplatelet therapy with aspirin and clopidogrel for up to 12 months, as tolerated (patient previously had to discontinue clopidogrel due to epistaxis). Aggressive secondary prevention of coronary artery disease. Escalate antianginal  therapy with addition of isosorbide mononitrate 15 mg daily. Anticipate discharge home tomorrow if no further chest pain or post catheterization complications. Nelva Bush, MD [facility]   Cardiac Studies   [facility] 10/01/2021 Conclusions: Nonobstructive coronary artery disease with patent stents in the proximal LAD and distal RCA/RPDA.  No culprit lesion identified to explain patient's chest pain and elevated troponin (MINOCA). Preserved left ventricular systolic function with focal apical akinesis.  LVEF 55-65% with normal filling pressure. Tortuous right radial artery with vasospasm precluding sheath placement.  Consider alternative access for further catheterizations.   Recommendations: Dual antiplatelet therapy with aspirin and clopidogrel for up to 12 months, as tolerated (patient previously had to discontinue clopidogrel due to epistaxis). Aggressive secondary prevention of coronary artery disease. Escalate antianginal therapy with addition of isosorbide mononitrate 15 mg daily. Anticipate discharge home tomorrow if no further chest pain or post catheterization complications.  Patient Profile     65 y.o. male with history of CAD, pulmonary fibrosis, presenting with chest pain diagnosed with NSTEMI.  Left heart cath showing patent stent.  Currently denies chest pain or shortness of breath.  Assessment & Plan    Chest pain, history of CAD/PCI -Left heart cath with patent LAD and RCA stents. -Denies chest pain -Aspirin, Plavix, Lipitor 80. -imdur 15 mg daily antianginal benefit.  Carvedilol.  Patient can be discharge from a cardiac perspective, close follow-up as an outpatient.       Signed, Kate Sable, MD  10/02/2021, 3:55 PM

## 2021-10-08 ENCOUNTER — Telehealth: Payer: Self-pay | Admitting: Cardiovascular Disease

## 2021-10-08 NOTE — Telephone Encounter (Signed)
Request fwd to Dr. Fletcher Anon to advise.

## 2021-10-08 NOTE — Telephone Encounter (Signed)
DPR on file. Lmom for pt wife. Pt has an appt with Dr. Fletcher Anon tomorrow. Pt wife is to call back if the letter is needed today, otherwise we can provide the letter at [place] visit.

## 2021-10-08 NOTE — Telephone Encounter (Signed)
Yes that is fine

## 2021-10-08 NOTE — Telephone Encounter (Signed)
Patient wife calling for return to work letter.    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  Per patient wife he is not required to lift any weight at work and was advised after 12/13 cath for MI that he could return to work after 1 week.

## 2021-10-09 ENCOUNTER — Ambulatory Visit (INDEPENDENT_AMBULATORY_CARE_PROVIDER_SITE_OTHER): Payer: BC Managed Care – PPO | Admitting: Cardiovascular Disease

## 2021-10-09 ENCOUNTER — Other Ambulatory Visit: Payer: Self-pay

## 2021-10-09 ENCOUNTER — Encounter: Payer: Self-pay | Admitting: Cardiovascular Disease

## 2021-10-09 VITALS — BP 110/70 | HR 67 | Ht 74.0 in | Wt 184.0 lb

## 2021-10-09 DIAGNOSIS — E785 Hyperlipidemia, unspecified: Secondary | ICD-10-CM | POA: Diagnosis not present

## 2021-10-09 DIAGNOSIS — I2510 Atherosclerotic heart disease of native coronary artery without angina pectoris: Secondary | ICD-10-CM

## 2021-10-09 DIAGNOSIS — I10 Essential (primary) hypertension: Secondary | ICD-10-CM

## 2021-10-09 DIAGNOSIS — J849 Interstitial pulmonary disease, unspecified: Secondary | ICD-10-CM | POA: Diagnosis not present

## 2021-10-09 NOTE — Progress Notes (Signed)
Cardiology Office Note   Date:  10/09/2021   ID:  [name], Dillon Aug 20, 1956, MRN [id number]  PCP:  [name], MD  Cardiologist:   [name], MD   Chief Complaint  Patient presents with   Other    Post cath c/o headaches with Isosorbide and would like Work release note to be able to go back to work.  Meds reviewed verbally with pt.      History of Present Illness: Dillon [name] is a 65 y.o. male who presents for follow-up visit regarding coronary artery disease. He has known history of coronary artery disease status post remote stenting of the right coronary artery, hyperlipidemia, pulmonary fibrosis and previous tobacco use. He presented in December 2020 with ST elevation myocardial infarction.  Cardiac catheterization showed occluded apical LAD supplying the distal inferior wall which was the culprit.  There was also 80% eccentric complex plaque rupture in the proximal LAD which was felt to be responsible for the distal embolization.  RCA stent was patent.  I performed successful PCI and drug-eluting stent placement to the proximal LAD.  I restored flow to the distal LAD just by wiring the occlusion site.  Ejection fraction was normal.  His myocardial infarction was in the setting of COVID-19 infection.  He presented recently with non-ST elevation myocardial infarction.  He underwent cardiac catheterization which showed patent stents in the proximal LAD and distal right coronary artery/right PDA.  No culprit was identified with normal ejection fraction and normal filling pressure.  Imdur was added and clopidogrel was resumed.  Clopidogrel was previously discontinued in October due to nosebleed and being almost 2 years out of his previous PCI.  The patient has been doing well with no recurrent chest pain.  He reports headache with isosorbide.  He resumed work on Monday with no issues.  No shortness of breath, palpitations or dizziness.  No recurrent nosebleeds.   Past  Medical History:  Diagnosis Date   Coronary artery disease    Elevated cholesterol    Pulmonary fibrosis (HCC)    Restless leg syndrome     Past Surgical History:  Procedure Laterality Date   ANGIOPLASTY  2010   2 stents placed   BACK SURGERY     COLONOSCOPY WITH PROPOFOL N/A 03/05/2018   Procedure: COLONOSCOPY WITH PROPOFOL;  Surgeon: Virgel Manifold, MD;  Location: [facility];  Service: Endoscopy;  Laterality: N/A;   CORONARY ANGIOPLASTY     CORONARY/GRAFT ACUTE MI REVASCULARIZATION N/A 09/30/2019   Procedure: Coronary/Graft Acute MI Revascularization;  Surgeon: Wellington Hampshire, MD;  Location: [place] [facility];  Service: Cardiovascular;  Laterality: N/A;   LEFT HEART CATH AND CORONARY ANGIOGRAPHY N/A 09/30/2019   Procedure: LEFT HEART CATH AND CORONARY ANGIOGRAPHY;  Surgeon: Wellington Hampshire, MD;  Location: [place] [facility];  Service: Cardiovascular;  Laterality: N/A;   LEFT HEART CATH AND CORS/GRAFTS ANGIOGRAPHY N/A 10/01/2021   Procedure: LEFT HEART CATH AND CORS/GRAFTS ANGIOGRAPHY;  Surgeon: Nelva Bush, MD;  Location: [place] [facility];  Service: Cardiovascular;  Laterality: N/A;     Current Outpatient Medications  Medication Sig Dispense Refill   acetaminophen (TYLENOL) 500 MG tablet Take 1,000 mg by mouth every 8 (eight) hours as needed for moderate pain.     ALPHA LIPOIC ACID PO Take 500-1,000 mg by mouth daily.     aspirin EC 81 MG tablet Take 81 mg by mouth daily. Swallow whole.     atorvastatin (LIPITOR) 40 MG  tablet TAKE 1 TABLET BY MOUTH EVERY DAY 90 tablet 2   carvedilol (COREG) 3.125 MG tablet TAKE 1 TABLET (3.125 MG TOTAL) BY MOUTH 2 (TWO) TIMES DAILY WITH A MEAL. 180 tablet 2   clopidogrel (PLAVIX) 75 MG tablet Take 1 tablet (75 mg total) by mouth daily with breakfast. 30 tablet 3   ezetimibe (ZETIA) 10 MG tablet Take 1 tablet (10 mg total) by mouth daily. 30 tablet 2   Fluticasone-Umeclidin-Vilant (TRELEGY ELLIPTA) 100-62.5-25 MCG/INH  AEPB Inhale 1 puff into the lungs daily.     isosorbide mononitrate (IMDUR) 30 MG 24 hr tablet Take 0.5 tablets (15 mg total) by mouth daily. 30 tablet 2   nitroGLYCERIN (NITROSTAT) 0.4 MG SL tablet Place 1 tablet (0.4 mg total) under the tongue every 5 (five) minutes x 3 doses as needed for chest pain. 20 tablet 12   Pirfenidone (ESBRIET) 267 MG CAPS Take 3 capsules by mouth 3 (three) times daily.     No current facility-administered medications for this visit.    Allergies:   Ace inhibitors    Social History:  The patient  reports that he quit smoking about 12 years ago. His smoking use included cigarettes. He has never used smokeless tobacco. He reports that he does not currently use alcohol. He reports that he does not currently use drugs.   Family History:  The patient's family history includes Heart disease in his father and mother.    ROS:  Please see the history of present illness.   Otherwise, review of systems are positive for none.   All other systems are reviewed and negative.    PHYSICAL EXAM: VS:  BP 110/70 (BP Location: Left Arm, Patient Position: Sitting, Cuff Size: Normal)    Pulse 67    Ht 6\' 2"  (1.88 m)    Wt 184 lb (83.5 kg)    SpO2 95%    BMI 23.62 kg/m  , BMI Body mass index is 23.62 kg/m. GEN: Well nourished, well developed, in no acute distress  HEENT: normal  Neck: no JVD, carotid bruits, or masses Cardiac: RRR; no murmurs, rubs, or gallops,no edema  Respiratory:  clear to auscultation bilaterally, normal work of breathing GI: soft, nontender, nondistended, + BS MS: no deformity or atrophy  Skin: warm and dry, no rash Neuro:  Strength and sensation are intact Psych: euthymic mood, full affect Right radial pulses normal with no hematoma.  Right groin is intact with normal pulses and no hematoma.  EKG:  EKG is ordered today. The ekg ordered today demonstrates normal sinus rhythm with no significant ST or T wave changes.   Recent Labs: 07/01/2021: ALT  21 09/30/2021: TSH 2.430 10/02/2021: BUN 13; Creatinine, Ser 0.64; Hemoglobin 13.6; Platelets 153; Potassium 4.6; Sodium 134    Lipid Panel    Component Value Date/Time   CHOL 118 10/01/2021 0422   TRIG 45 10/01/2021 0422   HDL 45 10/01/2021 0422   CHOLHDL 2.6 10/01/2021 0422   VLDL 9 10/01/2021 0422   LDLCALC 64 10/01/2021 0422   LDLCALC 63 08/31/2020 1433      Wt Readings from Last 3 Encounters:  10/09/21 184 lb (83.5 kg)  10/02/21 176 lb 9.4 oz (80.1 kg)  08/12/21 174 lb 6.4 oz (79.1 kg)       No flowsheet data found.    ASSESSMENT AND PLAN:  1.  Coronary artery disease involving native coronary arteries without angina: Recent non-ST elevation myocardial infarction without obstructive coronary artery disease.  The stents  were patent with no significant restenosis.  Interestingly, the event happened about 6 weeks after stopping clopidogrel.  Recommend continuing aspirin and clopidogrel for at least 1 year and preferably longer.  Continue aggressive treatment of risk factors.  I discussed with him cardiac rehab but he is not able to attend as he works every day.   I discontinued Imdur due to headaches.  2.  Hyperlipidemia: I reviewed his recent lipid profile which showed an LDL of 64.  Continue atorvastatin and ezetimibe.  3.  Essential hypertension: Blood pressures controlled on small dose carvedilol.  4.  Interstitial lung disease: Stable dyspnea.    Disposition:   FU with me in 6 months  Signed,  [name], MD  10/09/2021 3:13 PM    [place]

## 2021-10-09 NOTE — Patient Instructions (Signed)
Medication Instructions:  Your physician has recommended you make the following change in your medication:   STOP Isosorbide (Imdur)   *If you need a refill on your cardiac medications before your next appointment, please call your pharmacy*   Lab Work: None ordered  If you have labs (blood work) drawn today and your tests are completely normal, you will receive your results only by: [place] (if you have MyChart) OR A paper copy in the mail If you have any lab test that is abnormal or we need to change your treatment, we will call you to review the results.   Testing/Procedures: None ordered   Follow-Up: At [facility], you and your health needs are our priority.  As part of our continuing mission to provide you with exceptional heart care, we have created designated Provider Care Teams.  These Care Teams include your primary Cardiologist (physician) and Advanced Practice Providers (APPs -  Physician Assistants and Nurse Practitioners) who all work together to provide you with the care you need, when you need it.  We recommend signing up for the patient portal called "MyChart".  Sign up information is provided on this After Visit Summary.  MyChart is used to connect with patients for Virtual Visits (Telemedicine).  Patients are able to view lab/test results, encounter notes, upcoming appointments, etc.  Non-urgent messages can be sent to your provider as well.   To learn more about what you can do with MyChart, go to [email or web address].    Your next appointment:   6 month(s)  The format for your next appointment:   In Person  Provider:   You may see [name], MD or one of the following Advanced Practice Providers on your designated Care Team:   Murray Hodgkins, NP Christell Faith, PA-C Cadence Kathlen Mody, PA-C{    Other Instructions N/A

## 2021-10-13 DIAGNOSIS — J439 Emphysema, unspecified: Secondary | ICD-10-CM | POA: Diagnosis not present

## 2021-10-13 DIAGNOSIS — J449 Chronic obstructive pulmonary disease, unspecified: Secondary | ICD-10-CM | POA: Diagnosis not present

## 2021-10-13 DIAGNOSIS — I700 Atherosclerosis of aorta: Secondary | ICD-10-CM | POA: Diagnosis not present

## 2021-10-23 ENCOUNTER — Encounter: Payer: Self-pay | Admitting: Internal Medicine

## 2021-10-23 ENCOUNTER — Ambulatory Visit (INDEPENDENT_AMBULATORY_CARE_PROVIDER_SITE_OTHER): Payer: BC Managed Care – PPO | Admitting: Internal Medicine

## 2021-10-23 ENCOUNTER — Other Ambulatory Visit: Payer: Self-pay

## 2021-10-23 VITALS — BP 132/78 | HR 68 | Ht 74.0 in | Wt 183.6 lb

## 2021-10-23 DIAGNOSIS — E785 Hyperlipidemia, unspecified: Secondary | ICD-10-CM | POA: Diagnosis not present

## 2021-10-23 DIAGNOSIS — J8410 Pulmonary fibrosis, unspecified: Secondary | ICD-10-CM

## 2021-10-23 DIAGNOSIS — I10 Essential (primary) hypertension: Secondary | ICD-10-CM | POA: Diagnosis not present

## 2021-10-23 DIAGNOSIS — I214 Non-ST elevation (NSTEMI) myocardial infarction: Secondary | ICD-10-CM | POA: Diagnosis not present

## 2021-10-23 NOTE — Assessment & Plan Note (Signed)
Hypercholesterolemia  I advised the patient to follow Mediterranean diet This diet is rich in fruits vegetables and whole grain, and This diet is also rich in fish and lean meat Patient should also eat a handful of almonds or walnuts daily Recent heart study indicated that average follow-up on this kind of diet reduces the cardiovascular mortality by 50 to 70%== 

## 2021-10-23 NOTE — Progress Notes (Signed)
Established Patient Office Visit  Subjective:  Patient ID: Dillon [name], male    DOB: 26-Aug-1956  Age: 66 y.o. MRN: [id number]  CC:  Chief Complaint  Patient presents with   Hospitalization Follow-up    HPI  [name] presents for follow-up.  Patient is known to have coronary artery disease he had an  nonST elevation MI for which he was admitted to the hospital and cardiac cath was done which showed open LAD and open RCA stent, he was doing well now without any chest pain  Past Medical History:  Diagnosis Date   Coronary artery disease    Elevated cholesterol    Pulmonary fibrosis (HCC)    Restless leg syndrome     Past Surgical History:  Procedure Laterality Date   ANGIOPLASTY  2010   2 stents placed   BACK SURGERY     COLONOSCOPY WITH PROPOFOL N/A 03/05/2018   Procedure: COLONOSCOPY WITH PROPOFOL;  Surgeon: Virgel Manifold, MD;  Location: [facility];  Service: Endoscopy;  Laterality: N/A;   CORONARY ANGIOPLASTY     CORONARY/GRAFT ACUTE MI REVASCULARIZATION N/A 09/30/2019   Procedure: Coronary/Graft Acute MI Revascularization;  Surgeon: Wellington Hampshire, MD;  Location: [place] [facility];  Service: Cardiovascular;  Laterality: N/A;   LEFT HEART CATH AND CORONARY ANGIOGRAPHY N/A 09/30/2019   Procedure: LEFT HEART CATH AND CORONARY ANGIOGRAPHY;  Surgeon: Wellington Hampshire, MD;  Location: [place] [facility];  Service: Cardiovascular;  Laterality: N/A;   LEFT HEART CATH AND CORS/GRAFTS ANGIOGRAPHY N/A 10/01/2021   Procedure: LEFT HEART CATH AND CORS/GRAFTS ANGIOGRAPHY;  Surgeon: Nelva Bush, MD;  Location: [place] [facility];  Service: Cardiovascular;  Laterality: N/A;    Family History  Problem Relation Age of Onset   Heart disease Mother    Heart disease Father     Social History   Socioeconomic History   Marital status: Married    Spouse name: Not on file   Number of children: Not on file   Years of education: Not on file   Highest  education level: Not on file  Occupational History   Not on file  Tobacco Use   Smoking status: Former    Types: Cigarettes    Quit date: 03/05/2009    Years since quitting: 12.6   Smokeless tobacco: Never  Vaping Use   Vaping Use: Never used  Substance and Sexual Activity   Alcohol use: Not Currently    Comment: occassionally - 1 beer/month or less   Drug use: Not Currently   Sexual activity: Not Currently  Other Topics Concern   Not on file  Social History Narrative   Lives locally with wife.  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   Social Determinants of Health   Financial Resource Strain: Not on file  Food Insecurity: Not on file  Transportation Needs: Not on file  Physical Activity: Not on file  Stress: Not on file  Social Connections: Not on file  Intimate Partner Violence: Not on file     Current Outpatient Medications:    acetaminophen (TYLENOL) 500 MG tablet, Take 1,000 mg by mouth every 8 (eight) hours as needed for moderate pain., Disp: , Rfl:    ALPHA LIPOIC ACID PO, Take 500-1,000 mg by mouth daily., Disp: , Rfl:    aspirin EC 81 MG tablet, Take 81 mg by mouth daily. Swallow whole., Disp: , Rfl:    atorvastatin (LIPITOR) 40 MG tablet, TAKE  1 TABLET BY MOUTH EVERY DAY, Disp: 90 tablet, Rfl: 2   carvedilol (COREG) 3.125 MG tablet, TAKE 1 TABLET (3.125 MG TOTAL) BY MOUTH 2 (TWO) TIMES DAILY WITH A MEAL., Disp: 180 tablet, Rfl: 2   clopidogrel (PLAVIX) 75 MG tablet, Take 1 tablet (75 mg total) by mouth daily with breakfast., Disp: 30 tablet, Rfl: 3   ezetimibe (ZETIA) 10 MG tablet, Take 1 tablet (10 mg total) by mouth daily., Disp: 30 tablet, Rfl: 2   Fluticasone-Umeclidin-Vilant (TRELEGY ELLIPTA) 100-62.5-25 MCG/INH AEPB, Inhale 1 puff into the lungs daily., Disp: , Rfl:    nitroGLYCERIN (NITROSTAT) 0.4 MG SL tablet, Place 1 tablet (0.4 mg total) under the tongue every 5 (five) minutes x 3 doses as needed for chest pain., Disp: 20  tablet, Rfl: 12   Pirfenidone (ESBRIET) 267 MG CAPS, Take 3 capsules by mouth 3 (three) times daily., Disp: , Rfl:    Allergies  Allergen Reactions   Ace Inhibitors Swelling    Causes cough and swelling    ROS Review of Systems  Constitutional: Negative.   HENT: Negative.    Eyes: Negative.   Respiratory: Negative.    Cardiovascular: Negative.   Gastrointestinal: Negative.   Endocrine: Negative.   Genitourinary: Negative.   Musculoskeletal: Negative.   Skin: Negative.   Allergic/Immunologic: Negative.   Neurological: Negative.   Hematological: Negative.   Psychiatric/Behavioral: Negative.    All other systems reviewed and are negative.    Objective:    Physical Exam Vitals reviewed.  Constitutional:      Appearance: Normal appearance.  HENT:     Mouth/Throat:     Mouth: Mucous membranes are moist.  Eyes:     Pupils: Pupils are equal, round, and reactive to light.  Neck:     Vascular: No carotid bruit.  Cardiovascular:     Rate and Rhythm: Normal rate and regular rhythm.     Pulses: Normal pulses.     Heart sounds: Normal heart sounds.  Pulmonary:     Effort: Pulmonary effort is normal.     Breath sounds: Normal breath sounds.  Abdominal:     General: Bowel sounds are normal.     Palpations: Abdomen is soft. There is no hepatomegaly, splenomegaly or mass.     Tenderness: There is no abdominal tenderness.     Hernia: No hernia is present.  Musculoskeletal:     Cervical back: Neck supple.     Right lower leg: No edema.     Left lower leg: No edema.  Skin:    Findings: No rash.  Neurological:     Mental Status: He is alert and oriented to person, place, and time.     Motor: No weakness.  Psychiatric:        Mood and Affect: Mood normal.        Behavior: Behavior normal.    BP 132/78    Pulse 68    Ht '6\' 2"'  (1.88 m)    Wt 183 lb 9.6 oz (83.3 kg)    BMI 23.57 kg/m  Wt Readings from Last 3 Encounters:  10/23/21 183 lb 9.6 oz (83.3 kg)  10/09/21 184 lb  (83.5 kg)  10/02/21 176 lb 9.4 oz (80.1 kg)     Health Maintenance Due  Topic Date Due   Hepatitis C Screening  Never done   Zoster Vaccines- Shingrix (1 of 2) Never done   Pneumonia Vaccine 61+ Years old (2 - PCV) 10/02/2020   COVID-19 Vaccine (4 -  Booster for [employer] series) 10/31/2020   COLONOSCOPY (Pts 45-62yr Insurance coverage will need to be confirmed)  03/05/2021   INFLUENZA VACCINE  05/20/2021    There are no preventive care reminders to display for this patient.  Lab Results  Component Value Date   TSH 2.430 09/30/2021   Lab Results  Component Value Date   WBC 7.4 10/02/2021   HGB 13.6 10/02/2021   HCT 40.6 10/02/2021   MCV 99.8 10/02/2021   PLT 153 10/02/2021   Lab Results  Component Value Date   NA 134 (L) 10/02/2021   K 4.6 10/02/2021   CO2 28 10/02/2021   GLUCOSE 106 (H) 10/02/2021   BUN 13 10/02/2021   CREATININE 0.64 10/02/2021   BILITOT 0.5 07/01/2021   ALKPHOS 88 07/01/2021   AST 18 07/01/2021   ALT 21 07/01/2021   PROT 8.6 (H) 07/01/2021   ALBUMIN 3.7 07/01/2021   CALCIUM 8.5 (L) 10/02/2021   ANIONGAP 4 (L) 10/02/2021   EGFR 102 06/12/2021   Lab Results  Component Value Date   CHOL 118 10/01/2021   Lab Results  Component Value Date   HDL 45 10/01/2021   Lab Results  Component Value Date   LDLCALC 64 10/01/2021   Lab Results  Component Value Date   TRIG 45 10/01/2021   Lab Results  Component Value Date   CHOLHDL 2.6 10/01/2021   Lab Results  Component Value Date   HGBA1C 5.7 (H) 09/30/2021      Assessment & Plan:   Problem List Items Addressed This Visit       Cardiovascular and Mediastinum   Essential hypertension    The following hypertensive lifestyle modification were recommended and discussed:   3. Importance of regular aerobic exercise and losing weight. 4. Reduce dietary saturated fat and cholesterol intake for overall cardiovascular health. 5. Maintaining adequate dietary potassium, calcium, and magnesium  intake. 6. Regular monitoring of the blood pressure. 7. Reduce sodium intake to less than 100 mmol/day (less than 2.3 gm of sodium or less than 6 gm of sodium choride)       Non-ST elevation (NSTEMI) myocardial infarction (HCC) - Primary    No chest pain at the present time chest is clear heart is regular        Respiratory   Pulmonary fibrosis ([place]    Patient is on home oxygen therapy        Other   Hyperlipidemia    Hypercholesterolemia  I advised the patient to follow Mediterranean diet This diet is rich in fruits vegetables and whole grain, and This diet is also rich in fish and lean meat Patient should also eat a handful of almonds or walnuts daily Recent heart study indicated that average follow-up on this kind of diet reduces the cardiovascular mortality by 50 to 70%==       No orders of the defined types were placed in this encounter.   Follow-up: No follow-ups on file.    JCletis Athens MD

## 2021-10-23 NOTE — Assessment & Plan Note (Signed)
Patient is on home oxygen therapy 

## 2021-10-23 NOTE — Assessment & Plan Note (Signed)
The following hypertensive lifestyle modification were recommended and discussed:  ? ?3. Importance of regular aerobic exercise and losing weight. ?4. Reduce dietary saturated fat and cholesterol intake for overall cardiovascular health. 5. Maintaining adequate dietary potassium, calcium, and magnesium intake. ?6. Regular monitoring of the blood pressure. ?7. Reduce sodium intake to less than 100 mmol/day (less than 2.3 gm of sodium or less than 6 gm of sodium choride)  ?

## 2021-10-23 NOTE — Assessment & Plan Note (Signed)
No chest pain at the present time chest is clear heart is regular

## 2021-10-29 ENCOUNTER — Telehealth: Payer: Self-pay | Admitting: *Deleted

## 2021-10-29 NOTE — Telephone Encounter (Signed)
Transition Care Management Unsuccessful Follow-up Telephone Call  Date of discharge and from where: 10/02/21   Attempts:  1st Attempt  Reason for unsuccessful TCM follow-up call:  No answer/busy  Jacqlyn Larsen [facility], BSN RN Case Manager [phone]

## 2021-10-31 ENCOUNTER — Ambulatory Visit: Payer: BC Managed Care – PPO

## 2021-11-04 ENCOUNTER — Telehealth: Payer: Self-pay | Admitting: *Deleted

## 2021-11-04 NOTE — Telephone Encounter (Signed)
Transition Care Management Unsuccessful Follow-up Telephone Call  Date of discharge and from where: [facility]  10/02/21  Attempts:  2nd Attempt  Reason for unsuccessful TCM follow-up call:  No answer/busy  Jacqlyn Larsen [facility], BSN RN Case Manager [phone]

## 2021-11-12 ENCOUNTER — Other Ambulatory Visit: Payer: Self-pay

## 2021-11-12 NOTE — Progress Notes (Signed)
Patient having gi issues made an in person visit for him

## 2021-11-13 DIAGNOSIS — I700 Atherosclerosis of aorta: Secondary | ICD-10-CM | POA: Diagnosis not present

## 2021-11-13 DIAGNOSIS — J449 Chronic obstructive pulmonary disease, unspecified: Secondary | ICD-10-CM | POA: Diagnosis not present

## 2021-11-13 DIAGNOSIS — J439 Emphysema, unspecified: Secondary | ICD-10-CM | POA: Diagnosis not present

## 2021-12-05 ENCOUNTER — Telehealth: Payer: Self-pay | Admitting: Cardiovascular Disease

## 2021-12-05 NOTE — Telephone Encounter (Signed)
DPR on file. Lmom with Dr. Tyrell Antonio response. Ok to stop Plavix and continue Aspirin 81 mg daily.

## 2021-12-05 NOTE — Telephone Encounter (Signed)
Per Dr. Tyrell Antonio o/v on 10/09/21 "Imdur was added and clopidogrel was resumed.  Clopidogrel was previously discontinued in October due to nosebleed and being almost 2 years out of his previous PCI."  Msg fwd to Dr. Fletcher Anon to advise

## 2021-12-05 NOTE — Telephone Encounter (Signed)
Stop plavix and continue Aspirin.

## 2021-12-05 NOTE — Telephone Encounter (Signed)
Pt c/o medication issue:  1. Name of Medication: plavix and asa   2. How are you currently taking this medication (dosage and times per day)?   3. Are you having a reaction (difficulty breathing--STAT)? Nose bleed last night took about 20 minutes to stop   4. What is your medication issue? Patient was told to call if this happened .

## 2021-12-14 DIAGNOSIS — J439 Emphysema, unspecified: Secondary | ICD-10-CM | POA: Diagnosis not present

## 2021-12-14 DIAGNOSIS — J449 Chronic obstructive pulmonary disease, unspecified: Secondary | ICD-10-CM | POA: Diagnosis not present

## 2021-12-14 DIAGNOSIS — I700 Atherosclerosis of aorta: Secondary | ICD-10-CM | POA: Diagnosis not present

## 2021-12-21 IMAGING — US US PELVIS LIMITED
1 series · 10 of 10 positions shown · non-contrast
Comparison: None.

CLINICAL DATA: Left buttock mass.  Assess for abscess.

EXAM:
LIMITED ULTRASOUND OF PELVIS
TECHNIQUE: Limited transabdominal ultrasound examination of the pelvis was
performed.

[Series 1: us pelvis limited · 10 of 10 slices shown]
[im 1/10]
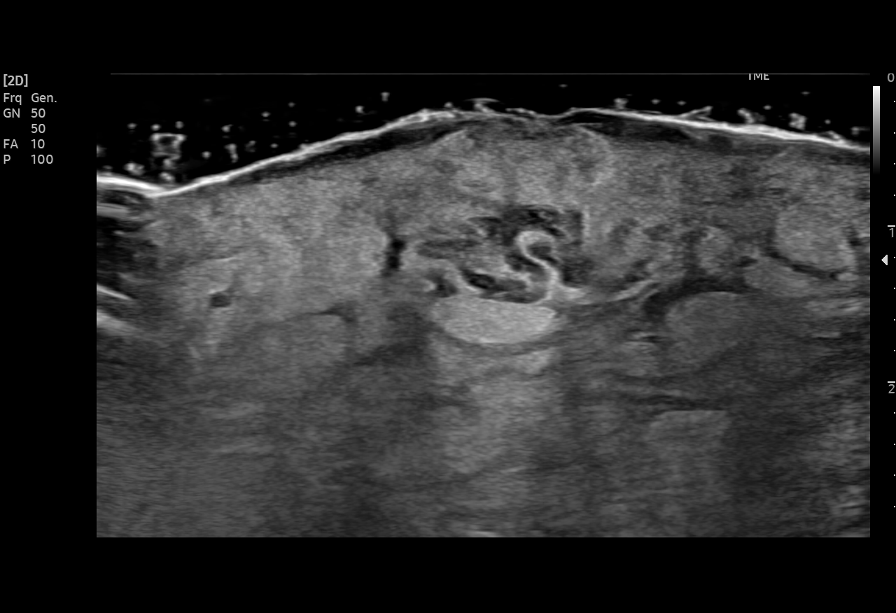
[im 2/10]
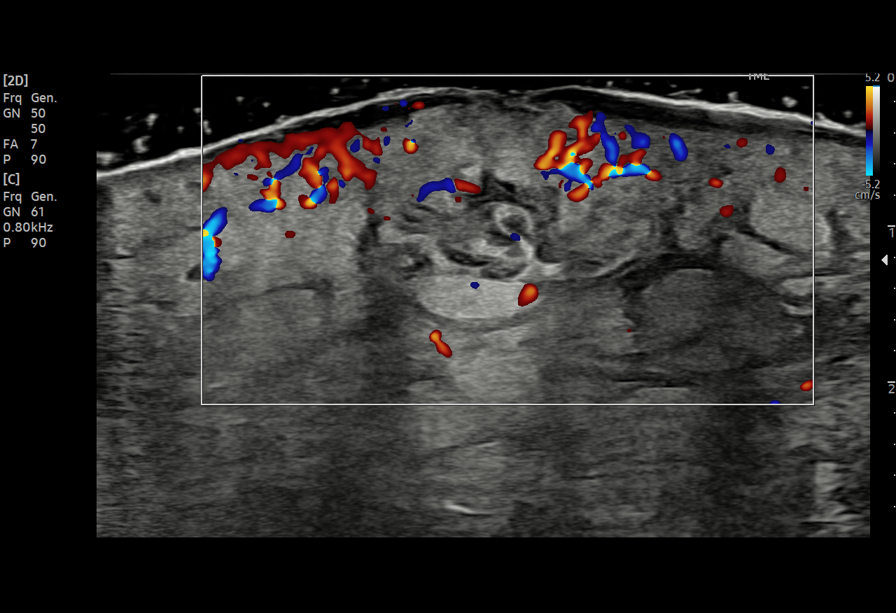
[im 3/10]
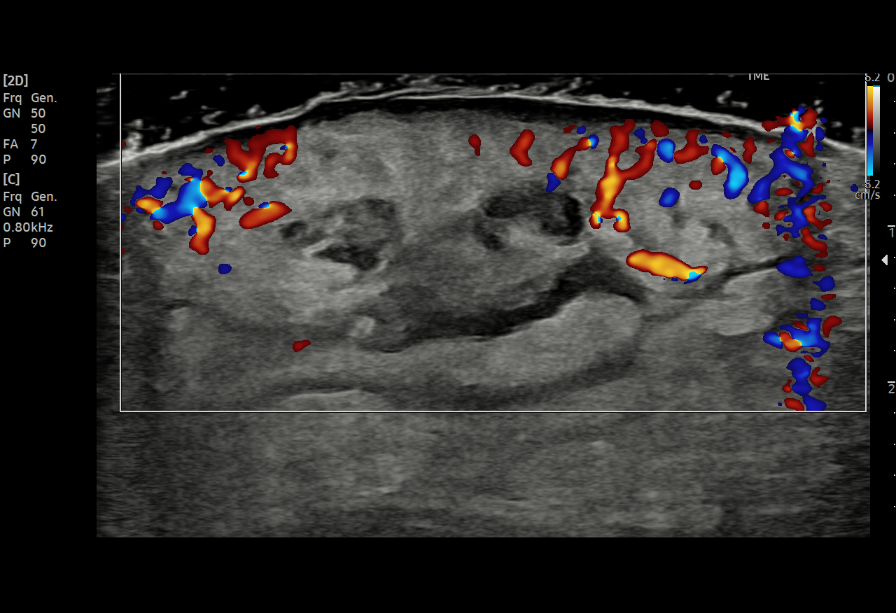
[im 4/10]
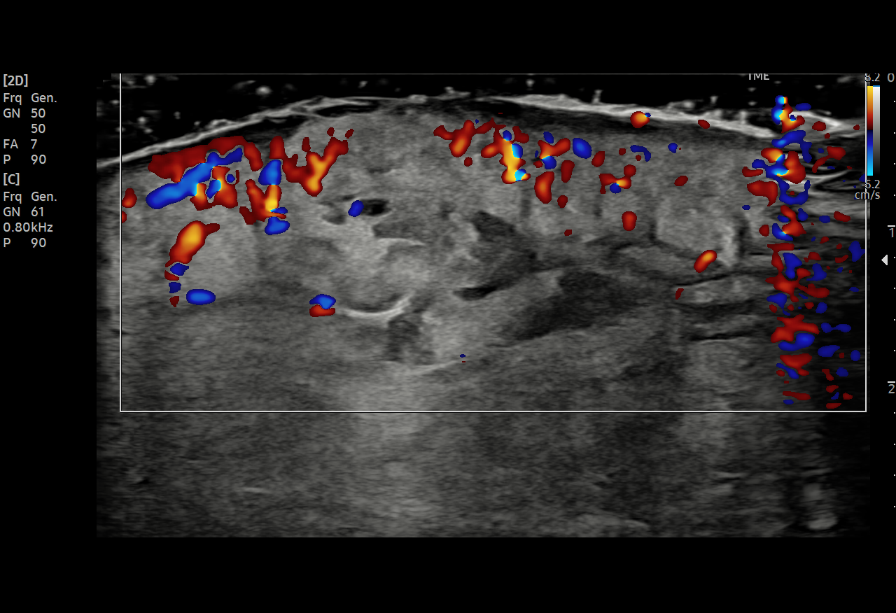
[im 5/10]
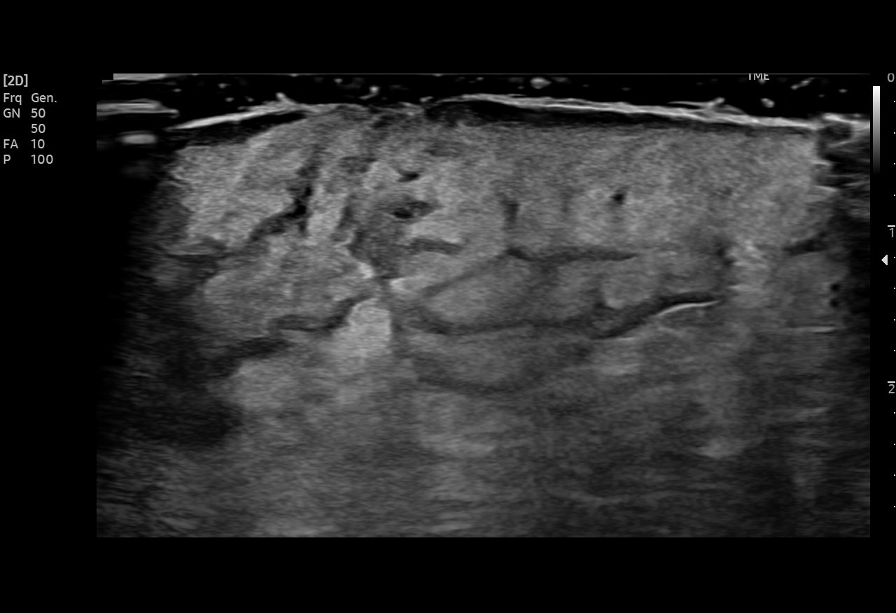
[im 6/10]
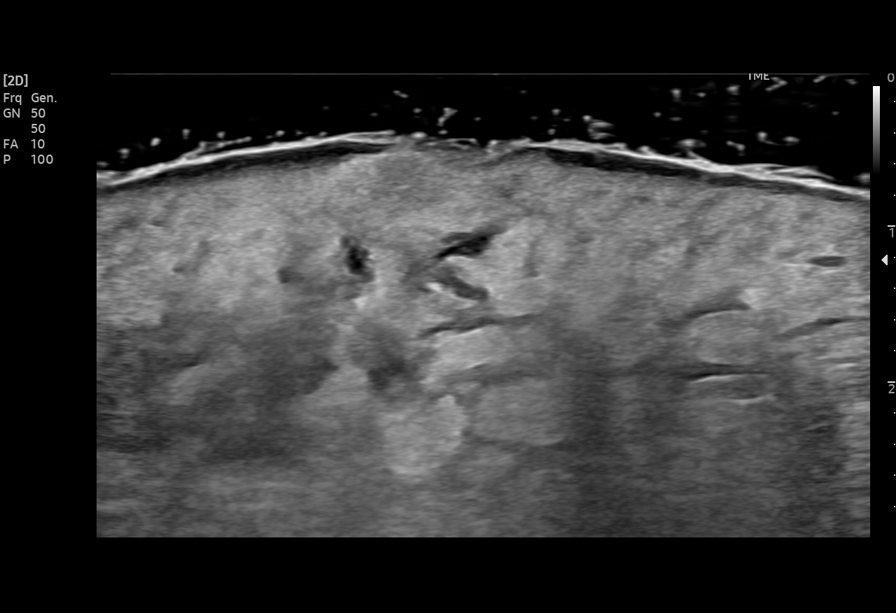
[im 7/10]
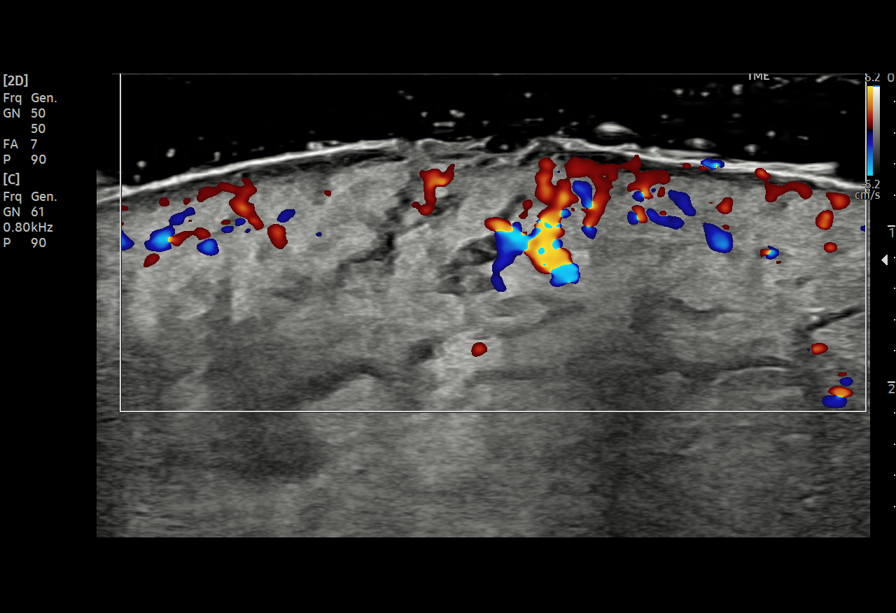
[im 8/10]
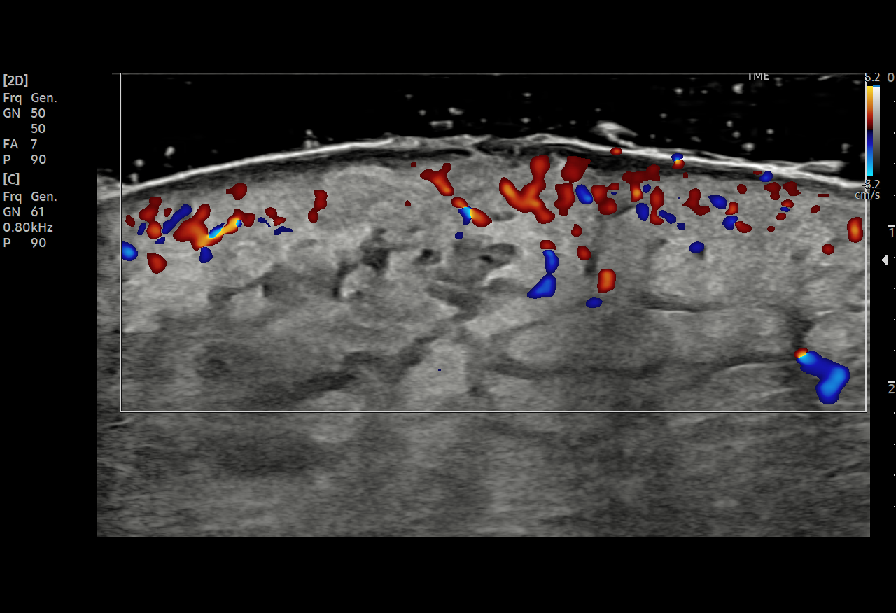
[im 9/10]
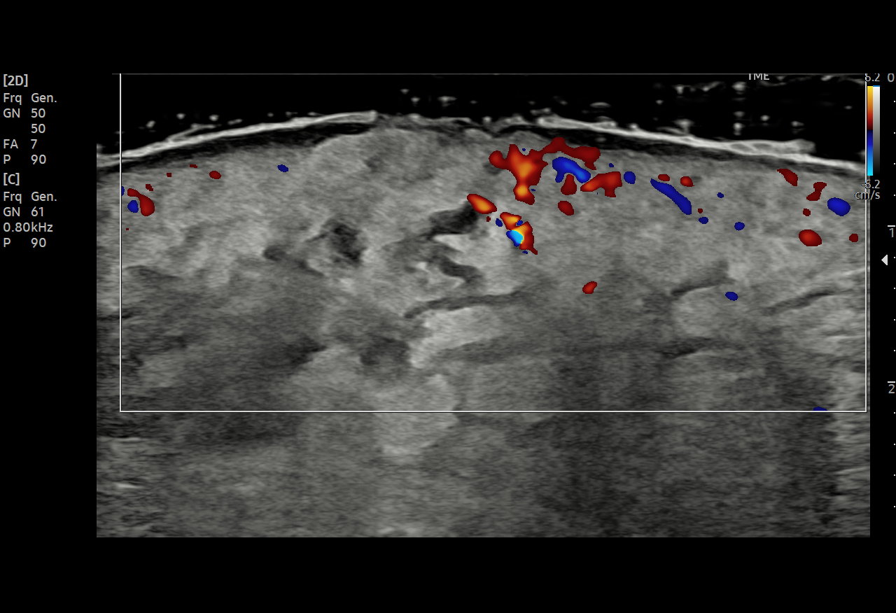
[im 10/10]
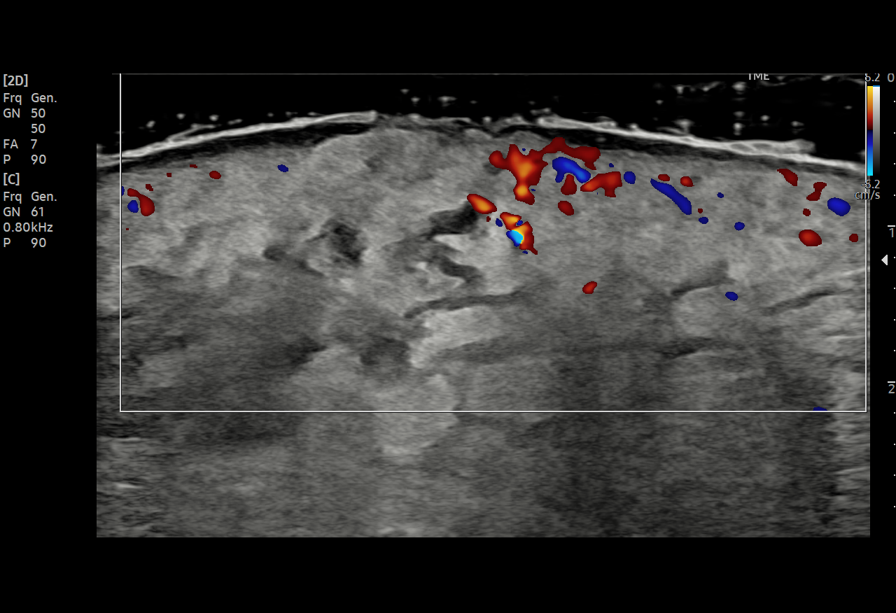

[10 of 10 positions shown; findings below may reference images not displayed]

FINDINGS: Focal area of edema within open wound is present in the left
buttocks. Hyperemia is noted. No discrete or drainable abscess is
present. The opening is again is identified.
IMPRESSION: 1. No discrete or drainable abscess.
2. Focal edema and hyperemia consistent with cellulitis.

## 2021-12-23 ENCOUNTER — Ambulatory Visit (INDEPENDENT_AMBULATORY_CARE_PROVIDER_SITE_OTHER): Payer: BC Managed Care – PPO | Admitting: Internal Medicine

## 2021-12-23 ENCOUNTER — Encounter: Payer: Self-pay | Admitting: Internal Medicine

## 2021-12-23 ENCOUNTER — Other Ambulatory Visit: Payer: Self-pay

## 2021-12-23 VITALS — BP 130/74 | HR 80 | Ht 74.0 in | Wt 186.7 lb

## 2021-12-23 DIAGNOSIS — E785 Hyperlipidemia, unspecified: Secondary | ICD-10-CM

## 2021-12-23 DIAGNOSIS — I10 Essential (primary) hypertension: Secondary | ICD-10-CM | POA: Diagnosis not present

## 2021-12-23 DIAGNOSIS — R7301 Impaired fasting glucose: Secondary | ICD-10-CM

## 2021-12-23 DIAGNOSIS — J8410 Pulmonary fibrosis, unspecified: Secondary | ICD-10-CM

## 2021-12-23 DIAGNOSIS — R300 Dysuria: Secondary | ICD-10-CM | POA: Diagnosis not present

## 2021-12-23 LAB — POCT URINALYSIS DIPSTICK
Bilirubin, UA: NEGATIVE
Glucose, UA: NEGATIVE
Ketones, UA: NEGATIVE
Leukocytes, UA: NEGATIVE
Nitrite, UA: NEGATIVE
Protein, UA: POSITIVE — AB
Spec Grav, UA: 1.025 (ref 1.010–1.025)
Urobilinogen, UA: NEGATIVE E.U./dL — AB
pH, UA: 5.5 (ref 5.0–8.0)

## 2021-12-23 MED ORDER — CIPROFLOXACIN HCL 500 MG PO TABS: 500.0000 mg | ORAL_TABLET | Freq: Two times a day (BID) | ORAL | 0 refills | Status: AC

## 2021-12-23 NOTE — Assessment & Plan Note (Signed)
Blood sugar under control 

## 2021-12-23 NOTE — Assessment & Plan Note (Signed)
Urine is abnormal blood drainage we will start the patient on Cipro refer to urologist also get PSA ?

## 2021-12-23 NOTE — Assessment & Plan Note (Signed)
Patient is on oxygen 24 hours a day ?

## 2021-12-23 NOTE — Assessment & Plan Note (Signed)
Hypercholesterolemia  I advised the patient to follow Mediterranean diet This diet is rich in fruits vegetables and whole grain, and This diet is also rich in fish and lean meat Patient should also eat a handful of almonds or walnuts daily Recent heart study indicated that average follow-up on this kind of diet reduces the cardiovascular mortality by 50 to 70%== 

## 2021-12-23 NOTE — Assessment & Plan Note (Signed)

## 2021-12-23 NOTE — Progress Notes (Signed)
Established Patient Office Visit  Subjective:  Patient ID: Dillon [name], male    DOB: 24-May-1956  Age: 66 y.o. MRN: [id number]  CC:  Chief Complaint  Patient presents with   Follow-up   Dysmenorrhea    Patient states that he has been having pain with urination x 2 days. Patient states that he has been having lower back pain for about a week.     HPI  Dillon [name] presents for uti  Past Medical History:  Diagnosis Date   Coronary artery disease    Elevated cholesterol    Pulmonary fibrosis ([address])    Restless leg syndrome     Past Surgical History:  Procedure Laterality Date   ANGIOPLASTY  2010   2 stents placed   BACK SURGERY     COLONOSCOPY WITH PROPOFOL N/A 03/05/2018   Procedure: COLONOSCOPY WITH PROPOFOL;  Surgeon: Virgel Manifold, MD;  Location: [facility];  Service: Endoscopy;  Laterality: N/A;   CORONARY ANGIOPLASTY     CORONARY/GRAFT ACUTE MI REVASCULARIZATION N/A 09/30/2019   Procedure: Coronary/Graft Acute MI Revascularization;  Surgeon: Wellington Hampshire, MD;  Location: [facility];  Service: Cardiovascular;  Laterality: N/A;   LEFT HEART CATH AND CORONARY ANGIOGRAPHY N/A 09/30/2019   Procedure: LEFT HEART CATH AND CORONARY ANGIOGRAPHY;  Surgeon: Wellington Hampshire, MD;  Location: [facility];  Service: Cardiovascular;  Laterality: N/A;   LEFT HEART CATH AND CORS/GRAFTS ANGIOGRAPHY N/A 10/01/2021   Procedure: LEFT HEART CATH AND CORS/GRAFTS ANGIOGRAPHY;  Surgeon: Nelva Bush, MD;  Location: [facility];  Service: Cardiovascular;  Laterality: N/A;    Family History  Problem Relation Age of Onset   Heart disease Mother    Heart disease Father     Social History   Socioeconomic History   Marital status: Married    Spouse name: Not on file   Number of children: Not on file   Years of education: Not on file   Highest education level: Not on file  Occupational History   Not on file  Tobacco Use   Smoking status:  Former    Types: Cigarettes    Quit date: 03/05/2009    Years since quitting: 12.8   Smokeless tobacco: Never  Vaping Use   Vaping Use: Never used  Substance and Sexual Activity   Alcohol use: Not Currently    Comment: occassionally - 1 beer/month or less   Drug use: Not Currently   Sexual activity: Not Currently  Other Topics Concern   Not on file  Social History Narrative   Lives locally with wife.  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   Social Determinants of Health   Financial Resource Strain: Not on file  Food Insecurity: Not on file  Transportation Needs: Not on file  Physical Activity: Not on file  Stress: Not on file  Social Connections: Not on file  Intimate Partner Violence: Not on file     Current Outpatient Medications:    acetaminophen (TYLENOL) 500 MG tablet, Take 1,000 mg by mouth every 8 (eight) hours as needed for moderate pain., Disp: , Rfl:    ALPHA LIPOIC ACID PO, Take 500-1,000 mg by mouth daily., Disp: , Rfl:    aspirin EC 81 MG tablet, Take 81 mg by mouth daily. Swallow whole., Disp: , Rfl:    atorvastatin (LIPITOR) 40 MG tablet, TAKE 1 TABLET BY MOUTH EVERY DAY, Disp: 90 tablet, Rfl: 2  carvedilol (COREG) 3.125 MG tablet, TAKE 1 TABLET (3.125 MG TOTAL) BY MOUTH 2 (TWO) TIMES DAILY WITH A MEAL., Disp: 180 tablet, Rfl: 2   ezetimibe (ZETIA) 10 MG tablet, Take 1 tablet (10 mg total) by mouth daily., Disp: 30 tablet, Rfl: 2   Fluticasone-Umeclidin-Vilant (TRELEGY ELLIPTA) 100-62.5-25 MCG/INH AEPB, Inhale 1 puff into the lungs daily., Disp: , Rfl:    nitroGLYCERIN (NITROSTAT) 0.4 MG SL tablet, Place 1 tablet (0.4 mg total) under the tongue every 5 (five) minutes x 3 doses as needed for chest pain., Disp: 20 tablet, Rfl: 12   Pirfenidone (ESBRIET) 267 MG CAPS, Take 3 capsules by mouth 3 (three) times daily., Disp: , Rfl:    Allergies  Allergen Reactions   Ace Inhibitors Swelling    Causes cough and swelling     ROS Review of Systems  Constitutional: Negative.   HENT: Negative.    Eyes: Negative.   Respiratory: Negative.    Cardiovascular: Negative.   Gastrointestinal: Negative.   Endocrine: Negative.   Genitourinary:  Positive for dysuria.  Musculoskeletal: Negative.   Skin: Negative.   Allergic/Immunologic: Negative.   Neurological: Negative.   Hematological: Negative.   Psychiatric/Behavioral: Negative.    All other systems reviewed and are negative.    Objective:    Physical Exam Vitals reviewed.  Constitutional:      Appearance: Normal appearance.  HENT:     Mouth/Throat:     Mouth: Mucous membranes are moist.  Eyes:     Pupils: Pupils are equal, round, and reactive to light.  Neck:     Vascular: No carotid bruit.  Cardiovascular:     Rate and Rhythm: Normal rate and regular rhythm.     Pulses: Normal pulses.     Heart sounds: Normal heart sounds.  Pulmonary:     Effort: Pulmonary effort is normal.     Breath sounds: Normal breath sounds.  Abdominal:     General: Bowel sounds are normal.     Palpations: Abdomen is soft. There is no hepatomegaly, splenomegaly or mass.     Tenderness: There is no abdominal tenderness.     Hernia: No hernia is present.  Musculoskeletal:     Cervical back: Neck supple.     Right lower leg: No edema.     Left lower leg: No edema.  Skin:    Findings: No rash.  Neurological:     Mental Status: He is alert and oriented to person, place, and time.     Motor: No weakness.  Psychiatric:        Mood and Affect: Mood normal.        Behavior: Behavior normal.    BP 130/74    Pulse 80    Ht '6\' 2"'  (1.88 m)    Wt 186 lb 11.2 oz (84.7 kg)    SpO2 91% Comment: 2 liters   BMI 23.97 kg/m  Wt Readings from Last 3 Encounters:  12/23/21 186 lb 11.2 oz (84.7 kg)  10/23/21 183 lb 9.6 oz (83.3 kg)  10/09/21 184 lb (83.5 kg)     Health Maintenance Due  Topic Date Due   Hepatitis C Screening  Never done   Zoster Vaccines- Shingrix (1 of 2)  Never done   Pneumonia Vaccine 10+ Years old (2 - PCV) 10/02/2020   COVID-19 Vaccine (4 - Booster for [place] series) 10/31/2020   COLONOSCOPY (Pts 45-40yr Insurance coverage will need to be confirmed)  03/05/2021   INFLUENZA VACCINE  05/20/2021  There are no preventive care reminders to display for this patient.  Lab Results  Component Value Date   TSH 2.430 09/30/2021   Lab Results  Component Value Date   WBC 7.4 10/02/2021   HGB 13.6 10/02/2021   HCT 40.6 10/02/2021   MCV 99.8 10/02/2021   PLT 153 10/02/2021   Lab Results  Component Value Date   NA 134 (L) 10/02/2021   K 4.6 10/02/2021   CO2 28 10/02/2021   GLUCOSE 106 (H) 10/02/2021   BUN 13 10/02/2021   CREATININE 0.64 10/02/2021   BILITOT 0.5 07/01/2021   ALKPHOS 88 07/01/2021   AST 18 07/01/2021   ALT 21 07/01/2021   PROT 8.6 (H) 07/01/2021   ALBUMIN 3.7 07/01/2021   CALCIUM 8.5 (L) 10/02/2021   ANIONGAP 4 (L) 10/02/2021   EGFR 102 06/12/2021   Lab Results  Component Value Date   CHOL 118 10/01/2021   Lab Results  Component Value Date   HDL 45 10/01/2021   Lab Results  Component Value Date   LDLCALC 64 10/01/2021   Lab Results  Component Value Date   TRIG 45 10/01/2021   Lab Results  Component Value Date   CHOLHDL 2.6 10/01/2021   Lab Results  Component Value Date   HGBA1C 5.7 (H) 09/30/2021      Assessment & Plan:   Problem List Items Addressed This Visit       Cardiovascular and Mediastinum   Essential hypertension     Patient denies any chest pain or shortness of breath there is no history of palpitation or paroxysmal nocturnal dyspnea   patient was advised to follow low-salt low-cholesterol diet    ideally I want to keep systolic blood pressure below 130 mmHg, patient was asked to check blood pressure one times a week and give me a report on that.  Patient will be follow-up in 3 months  or earlier as needed, patient will call me back for any change in the cardiovascular  symptoms Patient was advised to buy a book from local bookstore concerning blood pressure and read several chapters  every day.  This will be supplemented by some of the material we will give him from the office.  Patient should also utilize other resources like YouTube and Internet to learn more about the blood pressure and the diet.        Respiratory   Pulmonary fibrosis ([place])    Patient is on oxygen 24 hours a day        Endocrine   Impaired fasting glucose    Blood sugar under control        Other   Hyperlipidemia    Hypercholesterolemia  I advised the patient to follow Mediterranean diet This diet is rich in fruits vegetables and whole grain, and This diet is also rich in fish and lean meat Patient should also eat a handful of almonds or walnuts daily Recent heart study indicated that average follow-up on this kind of diet reduces the cardiovascular mortality by 50 to 70%==      Difficult or painful urination - Primary    Urine is abnormal blood drainage we will start the patient on Cipro refer to urologist also get PSA      Relevant Orders   POCT urinalysis dipstick (Completed)    No orders of the defined types were placed in this encounter.   Follow-up: No follow-ups on file.    [name], MD

## 2021-12-23 NOTE — Addendum Note (Signed)
Addended by: Lacretia Nicks L on: 12/23/2021 03:00 PM ? ? Modules accepted: Orders ? ?

## 2021-12-24 LAB — PSA: PSA: 0.49 ng/mL (ref <=4.00)

## 2022-01-07 ENCOUNTER — Other Ambulatory Visit: Payer: Self-pay

## 2022-01-07 ENCOUNTER — Ambulatory Visit (INDEPENDENT_AMBULATORY_CARE_PROVIDER_SITE_OTHER): Payer: BC Managed Care – PPO | Admitting: Internal Medicine

## 2022-01-07 ENCOUNTER — Encounter: Payer: Self-pay | Admitting: Internal Medicine

## 2022-01-07 VITALS — BP 124/64 | HR 68 | Ht 74.0 in | Wt 186.7 lb

## 2022-01-07 DIAGNOSIS — J8410 Pulmonary fibrosis, unspecified: Secondary | ICD-10-CM | POA: Diagnosis not present

## 2022-01-07 DIAGNOSIS — M67912 Unspecified disorder of synovium and tendon, left shoulder: Secondary | ICD-10-CM

## 2022-01-07 DIAGNOSIS — R300 Dysuria: Secondary | ICD-10-CM | POA: Diagnosis not present

## 2022-01-07 DIAGNOSIS — I10 Essential (primary) hypertension: Secondary | ICD-10-CM | POA: Diagnosis not present

## 2022-01-07 MED ORDER — CIPROFLOXACIN HCL 500 MG PO TABS: 500.0000 mg | ORAL_TABLET | Freq: Two times a day (BID) | ORAL | 0 refills | Status: AC

## 2022-01-07 NOTE — Assessment & Plan Note (Signed)
Stable now. 

## 2022-01-07 NOTE — Progress Notes (Signed)
? ?Established Patient Office Visit ? ?Subjective:  ?Patient ID: Dillon [name], male    DOB: 03/18/1956  Age: 65 y.o. MRN: [id number] ? ?CC:  ?Chief Complaint  ?Patient presents with  ? Lab Results  ? ? ?HPI ? ?Dillon [name] presents for  ?Follow up on labs ?Past Medical History:  ?Diagnosis Date  ? Coronary artery disease   ? Elevated cholesterol   ? Pulmonary fibrosis ([facility])   ? Restless leg syndrome   ? ? ?Past Surgical History:  ?Procedure Laterality Date  ? ANGIOPLASTY  2010  ? 2 stents placed  ? BACK SURGERY    ? COLONOSCOPY WITH PROPOFOL N/A 03/05/2018  ? Procedure: COLONOSCOPY WITH PROPOFOL;  Surgeon: Tahiliani, Varnita B, MD;  Location: [facility];  Service: Endoscopy;  Laterality: N/A;  ? CORONARY ANGIOPLASTY    ? CORONARY/GRAFT ACUTE MI REVASCULARIZATION N/A 09/30/2019  ? Procedure: Coronary/Graft Acute MI Revascularization;  Surgeon: Arida, Muhammad A, MD;  Location: [facility];  Service: Cardiovascular;  Laterality: N/A;  ? LEFT HEART CATH AND CORONARY ANGIOGRAPHY N/A 09/30/2019  ? Procedure: LEFT HEART CATH AND CORONARY ANGIOGRAPHY;  Surgeon: Arida, Muhammad A, MD;  Location: [facility];  Service: Cardiovascular;  Laterality: N/A;  ? LEFT HEART CATH AND CORS/GRAFTS ANGIOGRAPHY N/A 10/01/2021  ? Procedure: LEFT HEART CATH AND CORS/GRAFTS ANGIOGRAPHY;  Surgeon: End, Christopher, MD;  Location: [facility];  Service: Cardiovascular;  Laterality: N/A;  ? ? ?Family History  ?Problem Relation Age of Onset  ? Heart disease Mother   ? Heart disease Father   ? ? ?Social History  ? ?Socioeconomic History  ? Marital status: Married  ?  Spouse name: Not on file  ? Number of children: Not on file  ? Years of education: Not on file  ? Highest education level: Not on file  ?Occupational History  ? Not on file  ?Tobacco Use  ? Smoking status: Former  ?  Types: Cigarettes  ?  Quit date: 03/05/2009  ?  Years since quitting: 12.8  ? Smokeless tobacco: Never  ?Vaping Use  ? Vaping Use: Never  used  ?Substance and Sexual Activity  ? Alcohol use: Not Currently  ?  Comment: occassionally - 1 beer/month or less  ? Drug use: Not Currently  ? Sexual activity: Not Currently  ?Other Topics Concern  ? Not on file  ?Social History Narrative  ? Lives locally with wife.  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  Does not routinely exercise.  ? ?Social Determinants of Health  ? ?Financial Resource Strain: Not on file  ?Food Insecurity: Not on file  ?Transportation Needs: Not on file  ?Physical Activity: Not on file  ?Stress: Not on file  ?Social Connections: Not on file  ?Intimate Partner Violence: Not on file  ? ? ? ?Current Outpatient Medications:  ?  acetaminophen (TYLENOL) 500 MG tablet, Take 1,000 mg by mouth every 8 (eight) hours as needed for moderate pain., Disp: , Rfl:  ?  ALPHA LIPOIC ACID PO, Take 500-1,000 mg by mouth daily., Disp: , Rfl:  ?  aspirin EC 81 MG tablet, Take 81 mg by mouth daily. Swallow whole., Disp: , Rfl:  ?  atorvastatin (LIPITOR) 40 MG tablet, TAKE 1 TABLET BY MOUTH EVERY DAY, Disp: 90 tablet, Rfl: 2 ?  carvedilol (COREG) 3.125 MG tablet, TAKE 1 TABLET (3.125 MG TOTAL) BY MOUTH 2 (TWO) TIMES DAILY WITH A MEAL., Disp: 180 tablet, Rfl: 2 ?  ezetimibe (ZETIA) 10   MG tablet, Take 1 tablet (10 mg total) by mouth daily., Disp: 30 tablet, Rfl: 2 ?  Fluticasone-Umeclidin-Vilant (TRELEGY ELLIPTA) 100-62.5-25 MCG/INH AEPB, Inhale 1 puff into the lungs daily., Disp: , Rfl:  ?  nitroGLYCERIN (NITROSTAT) 0.4 MG SL tablet, Place 1 tablet (0.4 mg total) under the tongue every 5 (five) minutes x 3 doses as needed for chest pain., Disp: 20 tablet, Rfl: 12 ?  Pirfenidone (ESBRIET) 267 MG CAPS, Take 3 capsules by mouth 3 (three) times daily., Disp: , Rfl:   ? ?Allergies  ?Allergen Reactions  ? Ace Inhibitors Swelling  ?  Causes cough and swelling  ? ? ?ROS ?Review of Systems  ?Constitutional: Negative.   ?HENT: Negative.    ?Eyes: Negative.   ?Respiratory:  Positive for shortness of breath. Negative for  chest tightness and wheezing.   ?Cardiovascular: Negative.  Negative for palpitations and leg swelling.  ?Gastrointestinal: Negative.  Negative for abdominal pain.  ?Endocrine: Negative.   ?Genitourinary: Negative.   ?Musculoskeletal: Negative.   ?Skin: Negative.   ?Allergic/Immunologic: Negative.   ?Neurological: Negative.  Negative for speech difficulty.  ?Hematological: Negative.   ?Psychiatric/Behavioral: Negative.    ?All other systems reviewed and are negative. ? ?  ?Objective:  ?  ?Physical Exam ?Vitals reviewed.  ?Constitutional:   ?   Appearance: Normal appearance.  ?HENT:  ?   Mouth/Throat:  ?   Mouth: Mucous membranes are moist.  ?Eyes:  ?   Pupils: Pupils are equal, round, and reactive to light.  ?Neck:  ?   Vascular: No carotid bruit.  ?Cardiovascular:  ?   Rate and Rhythm: Normal rate and regular rhythm.  ?   Pulses: Normal pulses.  ?   Heart sounds: Normal heart sounds.  ?Pulmonary:  ?   Effort: Pulmonary effort is normal.  ?   Breath sounds: Normal breath sounds.  ?Abdominal:  ?   General: Bowel sounds are normal.  ?   Palpations: Abdomen is soft. There is no hepatomegaly, splenomegaly or mass.  ?   Tenderness: There is no abdominal tenderness.  ?   Hernia: No hernia is present.  ?Musculoskeletal:  ?   Cervical back: Neck supple.  ?   Right lower leg: No edema.  ?   Left lower leg: No edema.  ?Skin: ?   Findings: No rash.  ?Neurological:  ?   Mental Status: He is alert and oriented to person, place, and time.  ?   Motor: No weakness.  ?Psychiatric:     ?   Mood and Affect: Mood normal.     ?   Behavior: Behavior normal.  ? ? ?BP 124/64   Pulse 68   Ht 6' 2" (1.88 m)   Wt 186 lb 11.2 oz (84.7 kg)   BMI 23.97 kg/m?  ?Wt Readings from Last 3 Encounters:  ?01/07/22 186 lb 11.2 oz (84.7 kg)  ?12/23/21 186 lb 11.2 oz (84.7 kg)  ?10/23/21 183 lb 9.6 oz (83.3 kg)  ? ? ? ?Health Maintenance Due  ?Topic Date Due  ? Hepatitis C Screening  Never done  ? Zoster Vaccines- Shingrix (1 of 2) Never done  ?  Pneumonia Vaccine 63+ Years old (2 - PCV) 10/02/2020  ? COVID-19 Vaccine (4 - Booster for [place] series) 10/31/2020  ? COLONOSCOPY (Pts 45-34yr Insurance coverage will need to be confirmed)  03/05/2021  ? INFLUENZA VACCINE  05/20/2021  ? ? ?There are no preventive care reminders to display for this patient. ? ?Lab Results  ?Component  Value Date  ? TSH 2.430 09/30/2021  ? ?Lab Results  ?Component Value Date  ? WBC 7.4 10/02/2021  ? HGB 13.6 10/02/2021  ? HCT 40.6 10/02/2021  ? MCV 99.8 10/02/2021  ? PLT 153 10/02/2021  ? ?Lab Results  ?Component Value Date  ? NA 134 (L) 10/02/2021  ? K 4.6 10/02/2021  ? CO2 28 10/02/2021  ? GLUCOSE 106 (H) 10/02/2021  ? BUN 13 10/02/2021  ? CREATININE 0.64 10/02/2021  ? BILITOT 0.5 07/01/2021  ? ALKPHOS 88 07/01/2021  ? AST 18 07/01/2021  ? ALT 21 07/01/2021  ? PROT 8.6 (H) 07/01/2021  ? ALBUMIN 3.7 07/01/2021  ? CALCIUM 8.5 (L) 10/02/2021  ? ANIONGAP 4 (L) 10/02/2021  ? EGFR 102 06/12/2021  ? ?Lab Results  ?Component Value Date  ? CHOL 118 10/01/2021  ? ?Lab Results  ?Component Value Date  ? HDL 45 10/01/2021  ? ?Lab Results  ?Component Value Date  ? [place] 64 10/01/2021  ? ?Lab Results  ?Component Value Date  ? TRIG 45 10/01/2021  ? ?Lab Results  ?Component Value Date  ? CHOLHDL 2.6 10/01/2021  ? ?Lab Results  ?Component Value Date  ? HGBA1C 5.7 (H) 09/30/2021  ? ? ?  ?Assessment & Plan:  ? ?Problem List Items Addressed This Visit   ? ?  ? Cardiovascular and Mediastinum  ? Essential hypertension - Primary  ?  Blood pressure is under control ?  ?  ?  ? Respiratory  ? Pulmonary fibrosis ([place])  ?  Patient is on home oxygen therapy 24 hours a day ?  ?  ?  ? Musculoskeletal and Integument  ? Disorder of left rotator cuff  ?  Stable now ?  ?  ?  ? Other  ? Difficult or painful urination  ?  We will give another round of Cipro for 10 days ?  ?  ? ? ?No orders of the defined types were placed in this encounter. ? ? ?Follow-up: No follow-ups on file.  ? ? ?[name], MD ?

## 2022-01-07 NOTE — Assessment & Plan Note (Signed)
Blood pressure is under control 

## 2022-01-07 NOTE — Assessment & Plan Note (Signed)
Patient is on home oxygen therapy 24 hours a day ?

## 2022-01-07 NOTE — Assessment & Plan Note (Signed)
We will give another round of Cipro for 10 days ?

## 2022-01-11 DIAGNOSIS — J439 Emphysema, unspecified: Secondary | ICD-10-CM | POA: Diagnosis not present

## 2022-01-11 DIAGNOSIS — I700 Atherosclerosis of aorta: Secondary | ICD-10-CM | POA: Diagnosis not present

## 2022-01-11 DIAGNOSIS — J449 Chronic obstructive pulmonary disease, unspecified: Secondary | ICD-10-CM | POA: Diagnosis not present

## 2022-01-15 DIAGNOSIS — J439 Emphysema, unspecified: Secondary | ICD-10-CM | POA: Diagnosis not present

## 2022-01-15 DIAGNOSIS — R0609 Other forms of dyspnea: Secondary | ICD-10-CM | POA: Diagnosis not present

## 2022-01-15 DIAGNOSIS — J84112 Idiopathic pulmonary fibrosis: Secondary | ICD-10-CM | POA: Diagnosis not present

## 2022-01-20 ENCOUNTER — Other Ambulatory Visit: Payer: Self-pay

## 2022-01-20 ENCOUNTER — Ambulatory Visit (INDEPENDENT_AMBULATORY_CARE_PROVIDER_SITE_OTHER): Payer: BC Managed Care – PPO | Admitting: Gastroenterology

## 2022-01-20 ENCOUNTER — Encounter: Payer: Self-pay | Admitting: Gastroenterology

## 2022-01-20 VITALS — BP 133/75 | HR 74 | Temp 98.7°F | Wt 182.0 lb

## 2022-01-20 DIAGNOSIS — K648 Other hemorrhoids: Secondary | ICD-10-CM | POA: Diagnosis not present

## 2022-01-20 DIAGNOSIS — Z86010 Personal history of colonic polyps: Secondary | ICD-10-CM | POA: Diagnosis not present

## 2022-01-20 MED ORDER — PEG 3350-KCL-NA BICARB-NACL 420 G PO SOLR: ORAL | 0 refills | Status: DC

## 2022-01-20 NOTE — Progress Notes (Signed)
?  ?Jonathon Bellows MD, MRCP(U.K) ?[address]  ?[address]  ?[place], [place] [address]  ?Main: [phone]  ?Fax: [phone] ? ? ?Gastroenterology Consultation ? ?Referring Provider:     [name], MD ?Primary Care Physician:  [name], MD ?Primary Gastroenterologist:  Dr. Jonathon Bellows  ?Reason for Consultation:     Internal hemorrhoids ?      ? HPI:   ?Dillon [name] is a 66 y.o. y/o male referred for consultation & management  by Dr. [name], MD.   ? ? ?He has had a colonoscopy back in May 2019 by Dr. Bonna Gains.  At that time it was done for colon cancer screening and rectal bleeding few subcentimeter polyps were taken out of the rectum in the ascending colon.  Patchy area of inflammation is seen in the rectum which was biopsied.  Internal hemorrhoids were noted which I can see clearly on the pictures.  It is felt that the hemorrhoids were the source of internal bleeding.  Found to have 1 sessile serrated adenoma and 3 tubular adenomas.  Was recommended to have repeat colonoscopy in 3 years which he is overdue. ? ?He states that has been having longstanding issues with rectal bleeding, perianal discomfort particularly when he wipes.  Denies any constipation but states that he has to strain at times to have a good bowel movement. ?Past Medical History:  ?Diagnosis Date  ? Coronary artery disease   ? Elevated cholesterol   ? Pulmonary fibrosis ([place])   ? Restless leg syndrome   ? ? ?Past Surgical History:  ?Procedure Laterality Date  ? ANGIOPLASTY  2010  ? 2 stents placed  ? BACK SURGERY    ? COLONOSCOPY WITH PROPOFOL N/A 03/05/2018  ? Procedure: COLONOSCOPY WITH PROPOFOL;  Surgeon: Virgel Manifold, MD;  Location: [facility];  Service: Endoscopy;  Laterality: N/A;  ? CORONARY ANGIOPLASTY    ? CORONARY/GRAFT ACUTE MI REVASCULARIZATION N/A 09/30/2019  ? Procedure: Coronary/Graft Acute MI Revascularization;  Surgeon: Wellington Hampshire, MD;  Location: [facility];  Service:  Cardiovascular;  Laterality: N/A;  ? LEFT HEART CATH AND CORONARY ANGIOGRAPHY N/A 09/30/2019  ? Procedure: LEFT HEART CATH AND CORONARY ANGIOGRAPHY;  Surgeon: Wellington Hampshire, MD;  Location: [facility];  Service: Cardiovascular;  Laterality: N/A;  ? LEFT HEART CATH AND CORS/GRAFTS ANGIOGRAPHY N/A 10/01/2021  ? Procedure: LEFT HEART CATH AND CORS/GRAFTS ANGIOGRAPHY;  Surgeon: Nelva Bush, MD;  Location: [facility];  Service: Cardiovascular;  Laterality: N/A;  ? ? ?Prior to Admission medications   ?Medication Sig Start Date End Date Taking? Authorizing Provider  ?acetaminophen (TYLENOL) 500 MG tablet Take 1,000 mg by mouth every 8 (eight) hours as needed for moderate pain.    [provider]  ?ALPHA LIPOIC ACID PO Take 500-1,000 mg by mouth daily.    [provider]  ?aspirin EC 81 MG tablet Take 81 mg by mouth daily. Swallow whole.    [provider]  ?atorvastatin (LIPITOR) 40 MG tablet TAKE 1 TABLET BY MOUTH EVERY DAY 06/13/21   [name], MD  ?carvedilol (COREG) 3.125 MG tablet TAKE 1 TABLET (3.125 MG TOTAL) BY MOUTH 2 (TWO) TIMES DAILY WITH A MEAL. 05/13/21   Wellington Hampshire, MD  ?ezetimibe (ZETIA) 10 MG tablet Take 1 tablet (10 mg total) by mouth daily. 10/03/21   Fritzi Mandes, MD  ?Fluticasone-Umeclidin-Vilant (TRELEGY ELLIPTA) 100-62.5-25 MCG/INH AEPB Inhale 1 puff into the lungs daily.    [provider]  ?isosorbide mononitrate (IMDUR) 30 MG 24 hr tablet Take 15 mg by mouth daily. 12/24/21   [provider]  ?nitroGLYCERIN (NITROSTAT) 0.4 MG SL tablet Place 1 tablet (0.4 mg total) under the tongue every 5 (five) minutes x 3 doses as needed for chest pain. 10/02/21   Fritzi Mandes, MD  ?Pirfenidone (ESBRIET) 267 MG CAPS Take 3 capsules by mouth 3 (three) times daily.    [provider]  ? ? ?Family History  ?Problem Relation Age of Onset  ? Heart disease Mother   ? Heart disease Father   ?  ? ?Social History  ? ?Tobacco Use  ?  Smoking status: Former  ?  Types: Cigarettes  ?  Quit date: 03/05/2009  ?  Years since quitting: 12.8  ? Smokeless tobacco: Never  ?Vaping Use  ? Vaping Use: Never used  ?Substance Use Topics  ? Alcohol use: Not Currently  ?  Comment: occassionally - 1 beer/month or less  ? Drug use: Not Currently  ? ? ?Allergies as of 01/20/2022 - Review Complete 01/20/2022  ?Allergen Reaction Noted  ? Ace inhibitors Swelling 10/28/2017  ? ? ?Review of Systems:    ?All systems reviewed and negative except where noted in HPI. ? ? Physical Exam:  ?BP 133/75   Pulse 74   Temp 98.7 ?F (37.1 ?C) (Oral)   Wt 182 lb (82.6 kg)   BMI 23.37 kg/m?  ?No LMP for male patient. ?Psych:  Alert and cooperative. Normal mood and affect. ?General:   Alert,  Well-developed, well-nourished, pleasant and cooperative in NAD ?Head:  Normocephalic and atraumatic. ?Neurologic:  Alert and oriented x3;  grossly normal neurologically. ?Psych:  Alert and cooperative. Normal mood and affect. ? ?Imaging Studies: ?No results found. ? ?Assessment and Plan:  ? ?Dillon [name] is a 66 y.o. y/o male has been referred for internal hemorrhoids.  Last colonoscopy was in 2019.  Had 4 adenomas.  He is overdue for his surveillance colonoscopy by at least 1 year. ? ?Plan ?1.  Colonoscopy ?2 . Internal hemorrhoids: Suggest conservative management with high-fiber diet with an aim to get at least 25 to 30 g of fiber per day.  Discussed about it in depth and provided patient information.  In addition counseled on perianal hygiene and management including using good quality soft toilet paper, cleaning the perianal area after bowel movement 2-3 times gently and if further cleansing required to use a showerhead to wash the area and pat dry with a cotton cloth/towel.  Suggest sitting in a warm water bath or a sitz bath at night for 15 to 20 minutes which would help decrease inflammation.  Could attempt a trial of Anusol suppositories for 5 days.  Usually conservative management  helps resolve the issues with internal hemorrhoids and 50 to 60% of patients.  If it fails we could consider banding at the office.  ? ?I have discussed alternative options, risks & benefits,  which include, but are not limited to, bleeding, infection, perforation,respiratory complication & drug reaction.  The patient agrees with this plan & written consent will be obtained.   ? ?Follow up in 2 -4 weeks for hemorrhoidal banding ? ?Dr Jonathon Bellows MD,MRCP(U.K) ? ?

## 2022-01-23 ENCOUNTER — Encounter: Payer: Self-pay | Admitting: Gastroenterology

## 2022-01-24 ENCOUNTER — Encounter
Admission: RE | Disposition: A | Discharge status: Home / Self Care | Payer: Self-pay | Source: Home / Self Care | Attending: Gastroenterology

## 2022-01-24 ENCOUNTER — Ambulatory Visit
Admission: RE | Admit: 2022-01-24 | Discharge: 2022-01-24 | Disposition: A | Discharge status: Home / Self Care | Payer: BC Managed Care – PPO | Attending: Gastroenterology | Admitting: Gastroenterology

## 2022-01-24 ENCOUNTER — Ambulatory Visit: Payer: BC Managed Care – PPO | Admitting: Anesthesiology

## 2022-01-24 ENCOUNTER — Encounter: Payer: Self-pay | Admitting: Gastroenterology

## 2022-01-24 ENCOUNTER — Other Ambulatory Visit: Payer: Self-pay

## 2022-01-24 DIAGNOSIS — J449 Chronic obstructive pulmonary disease, unspecified: Secondary | ICD-10-CM | POA: Insufficient documentation

## 2022-01-24 DIAGNOSIS — Z87891 Personal history of nicotine dependence: Secondary | ICD-10-CM | POA: Diagnosis not present

## 2022-01-24 DIAGNOSIS — I2510 Atherosclerotic heart disease of native coronary artery without angina pectoris: Secondary | ICD-10-CM | POA: Insufficient documentation

## 2022-01-24 DIAGNOSIS — Z1211 Encounter for screening for malignant neoplasm of colon: Secondary | ICD-10-CM | POA: Diagnosis not present

## 2022-01-24 DIAGNOSIS — Z955 Presence of coronary angioplasty implant and graft: Secondary | ICD-10-CM | POA: Diagnosis not present

## 2022-01-24 DIAGNOSIS — D122 Benign neoplasm of ascending colon: Secondary | ICD-10-CM | POA: Insufficient documentation

## 2022-01-24 DIAGNOSIS — K648 Other hemorrhoids: Secondary | ICD-10-CM

## 2022-01-24 DIAGNOSIS — K640 First degree hemorrhoids: Secondary | ICD-10-CM | POA: Insufficient documentation

## 2022-01-24 DIAGNOSIS — K635 Polyp of colon: Secondary | ICD-10-CM

## 2022-01-24 DIAGNOSIS — Z09 Encounter for follow-up examination after completed treatment for conditions other than malignant neoplasm: Secondary | ICD-10-CM | POA: Insufficient documentation

## 2022-01-24 DIAGNOSIS — Z86010 Personal history of colonic polyps: Secondary | ICD-10-CM

## 2022-01-24 DIAGNOSIS — I252 Old myocardial infarction: Secondary | ICD-10-CM | POA: Insufficient documentation

## 2022-01-24 DIAGNOSIS — D120 Benign neoplasm of cecum: Secondary | ICD-10-CM | POA: Diagnosis not present

## 2022-01-24 DIAGNOSIS — E785 Hyperlipidemia, unspecified: Secondary | ICD-10-CM | POA: Diagnosis not present

## 2022-01-24 DIAGNOSIS — Z7902 Long term (current) use of antithrombotics/antiplatelets: Secondary | ICD-10-CM | POA: Diagnosis not present

## 2022-01-24 DIAGNOSIS — Z9981 Dependence on supplemental oxygen: Secondary | ICD-10-CM | POA: Insufficient documentation

## 2022-01-24 DIAGNOSIS — Z7982 Long term (current) use of aspirin: Secondary | ICD-10-CM | POA: Insufficient documentation

## 2022-01-24 HISTORY — DX: Acute myocardial infarction, unspecified: I21.9

## 2022-01-24 HISTORY — PX: COLONOSCOPY WITH PROPOFOL: SHX5780

## 2022-01-24 SURGERY — COLONOSCOPY WITH PROPOFOL
Anesthesia: General

## 2022-01-24 MED ORDER — MIDAZOLAM HCL 2 MG/2ML IJ SOLN
INTRAMUSCULAR | Status: DC | PRN
  Administered 2022-01-24 (×2): 1 mg via INTRAVENOUS

## 2022-01-24 MED ORDER — EPHEDRINE SULFATE (PRESSORS) 50 MG/ML IJ SOLN
INTRAMUSCULAR | Status: DC | PRN
  Administered 2022-01-24: 10 mg via INTRAVENOUS
  Administered 2022-01-24: 5 mg via INTRAVENOUS

## 2022-01-24 MED ORDER — STERILE WATER FOR IRRIGATION IR SOLN
Status: DC | PRN
  Administered 2022-01-24: 100 mL

## 2022-01-24 MED ORDER — MIDAZOLAM HCL 2 MG/2ML IJ SOLN
INTRAMUSCULAR | Status: AC
  Filled 2022-01-24: qty 2

## 2022-01-24 MED ORDER — SODIUM CHLORIDE 0.9 % IV SOLN: INTRAVENOUS | Status: DC

## 2022-01-24 MED ORDER — PROPOFOL 500 MG/50ML IV EMUL
INTRAVENOUS | Status: DC | PRN
Start: 2022-01-24 — End: 2022-01-24
  Administered 2022-01-24: 110 ug/kg/min via INTRAVENOUS

## 2022-01-24 MED ORDER — PROPOFOL 10 MG/ML IV BOLUS
INTRAVENOUS | Status: DC | PRN
Start: 2022-01-24 — End: 2022-01-24
  Administered 2022-01-24: 60 mg via INTRAVENOUS

## 2022-01-24 MED ORDER — PROPOFOL 500 MG/50ML IV EMUL
INTRAVENOUS | Status: AC
  Filled 2022-01-24: qty 50

## 2022-01-24 NOTE — Anesthesia Postprocedure Evaluation (Signed)
Anesthesia Post Note ? ?Patient: Dillon [name] ? ?Procedure(s) Performed: COLONOSCOPY WITH PROPOFOL ? ?Patient location during evaluation: PACU ?Anesthesia Type: General ?Level of consciousness: awake and oriented ?Pain management: pain level controlled ?Vital Signs Assessment: post-procedure vital signs reviewed and stable ?Respiratory status: spontaneous breathing and respiratory function stable ?Cardiovascular status: stable ?Anesthetic complications: no ? ? ?No notable events documented. ? ? ?Last Vitals:  ?Vitals:  ? 01/24/22 1329 01/24/22 1348  ?BP: (!) 88/49 134/75  ?Pulse: 77   ?Resp: 20   ?Temp:    ?SpO2: 95%   ?  ?Last Pain:  ?Vitals:  ? 01/24/22 1348  ?TempSrc:   ?PainSc: 0-No pain  ? ? ?  ?  ?  ?  ?  ?  ? ?[name],[name] ? ? ? ? ?

## 2022-01-24 NOTE — H&P (Signed)
?Dillon Bellows, MD ?[address], [place], [place], [place], [address] ?[address], [place], [place], [place], [address] ?Phone: [phone]  ?Fax: [phone] ? ?Primary Care Physician:  [name], MD ? ? ?Pre-Procedure History & Physical: ?HPI:  Dillon [name] is a 66 y.o. male is here for an colonoscopy. ?  ?Past Medical History:  ?Diagnosis Date  ? Coronary artery disease   ? Elevated cholesterol   ? Myocardial infarction [facility])   ? Pulmonary fibrosis ([place])   ? Restless leg syndrome   ? ? ?Past Surgical History:  ?Procedure Laterality Date  ? ANGIOPLASTY  2010  ? 2 stents placed  ? BACK SURGERY    ? cardiac stents    ? x2  ? COLONOSCOPY    ? COLONOSCOPY WITH PROPOFOL N/A 03/05/2018  ? Procedure: COLONOSCOPY WITH PROPOFOL;  Surgeon: Virgel Manifold, MD;  Location: [facility] ENDOSCOPY;  Service: Endoscopy;  Laterality: N/A;  ? CORONARY ANGIOPLASTY    ? CORONARY/GRAFT ACUTE MI REVASCULARIZATION N/A 09/30/2019  ? Procedure: Coronary/Graft Acute MI Revascularization;  Surgeon: Wellington Hampshire, MD;  Location: [place] [facility];  Service: Cardiovascular;  Laterality: N/A;  ? LEFT HEART CATH AND CORONARY ANGIOGRAPHY N/A 09/30/2019  ? Procedure: LEFT HEART CATH AND CORONARY ANGIOGRAPHY;  Surgeon: Wellington Hampshire, MD;  Location: [place] [facility];  Service: Cardiovascular;  Laterality: N/A;  ? LEFT HEART CATH AND CORS/GRAFTS ANGIOGRAPHY N/A 10/01/2021  ? Procedure: LEFT HEART CATH AND CORS/GRAFTS ANGIOGRAPHY;  Surgeon: Nelva Bush, MD;  Location: [place] [facility];  Service: Cardiovascular;  Laterality: N/A;  ? ? ?Prior to Admission medications   ?Medication Sig Start Date End Date Taking? Authorizing Provider  ?aspirin EC 81 MG tablet Take 81 mg by mouth daily. Swallow whole.   Yes [provider]  ?carvedilol (COREG) 3.125 MG tablet TAKE 1 TABLET (3.125 MG TOTAL) BY MOUTH 2 (TWO) TIMES DAILY WITH A MEAL. 05/13/21  Yes Wellington Hampshire, MD  ?clopidogrel (PLAVIX) 75 MG tablet Take 37.5 mg  by mouth every other day.   Yes [provider]  ?isosorbide mononitrate (IMDUR) 30 MG 24 hr tablet Take 15 mg by mouth daily. 12/24/21  Yes [provider]  ?acetaminophen (TYLENOL) 500 MG tablet Take 1,000 mg by mouth every 8 (eight) hours as needed for moderate pain.    [provider]  ?ALPHA LIPOIC ACID PO Take 500-1,000 mg by mouth daily.    [provider]  ?atorvastatin (LIPITOR) 40 MG tablet TAKE 1 TABLET BY MOUTH EVERY DAY 06/13/21   [name], MD  ?ezetimibe (ZETIA) 10 MG tablet Take 1 tablet (10 mg total) by mouth daily. 10/03/21   Fritzi Mandes, MD  ?Fluticasone-Umeclidin-Vilant (TRELEGY ELLIPTA) 100-62.5-25 MCG/INH AEPB Inhale 1 puff into the lungs daily.    [provider]  ?nitroGLYCERIN (NITROSTAT) 0.4 MG SL tablet Place 1 tablet (0.4 mg total) under the tongue every 5 (five) minutes x 3 doses as needed for chest pain. 10/02/21   Fritzi Mandes, MD  ?Pirfenidone (ESBRIET) 267 MG CAPS Take 3 capsules by mouth 3 (three) times daily.    [provider]  ?polyethylene glycol-electrolytes (NULYTELY) 420 g solution Prepare according to package instructions. Starting at 5:00 PM: Drink one 8 oz glass of mixture every 15 minutes until you finish half of the jug. Five hours prior to procedure, drink 8 oz glass of mixture every 15 minutes until it is all gone. Make sure you do not drink anything 4 hours prior  to your procedure. 01/20/22   Dillon Bellows, MD  ? ? ?Allergies as of 01/21/2022 - Review Complete 01/20/2022  ?Allergen Reaction Noted  ? Ace inhibitors Swelling 10/28/2017  ? ? ?Family History  ?Problem Relation Age of Onset  ? Heart disease Mother   ? Heart disease Father   ? ? ?Social History  ? ?Socioeconomic History  ? Marital status: Married  ?  Spouse name: Not on file  ? Number of children: Not on file  ? Years of education: Not on file  ? Highest education level: Not on file  ?Occupational History  ? Not on file  ?Tobacco Use  ? Smoking status:  Former  ?  Types: Cigarettes  ?  Quit date: 03/05/2009  ?  Years since quitting: 12.8  ? Smokeless tobacco: Never  ?Vaping Use  ? Vaping Use: Never used  ?Substance and Sexual Activity  ? Alcohol use: Not Currently  ?  Comment: occassionally - 1 beer/month or less  ? Drug use: Not Currently  ? Sexual activity: Not Currently  ?Other Topics Concern  ? Not on file  ?Social History Narrative  ? Lives locally with wife.  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  ? ?Social Determinants of Health  ? ?Financial Resource Strain: Not on file  ?Food Insecurity: Not on file  ?Transportation Needs: Not on file  ?Physical Activity: Not on file  ?Stress: Not on file  ?Social Connections: Not on file  ?Intimate Partner Violence: Not on file  ? ? ?Review of Systems: ?See HPI, otherwise negative ROS ? ?Physical Exam: ?BP 135/72   Pulse 70   Temp (!) 96.3 ?F (35.7 ?C) (Temporal)   Resp 20   Ht '6\' 2"'$  (1.88 m)   Wt 83.9 kg   SpO2 97%   BMI 23.75 kg/m?  ?General:   Alert,  pleasant and cooperative in NAD ?Head:  Normocephalic and atraumatic. ?Neck:  Supple; no masses or thyromegaly. ?Lungs:  Clear throughout to auscultation, normal respiratory effort.    ?Heart:  +S1, +S2, Regular rate and rhythm, No edema. ?Abdomen:  Soft, nontender and nondistended. Normal bowel sounds, without guarding, and without rebound.   ?Neurologic:  Alert and  oriented x4;  grossly normal neurologically. ? ?Impression/Plan: ?Dillon [name] is here for an colonoscopy to be performed for surveillance due to prior history of colon polyps  ? ?Risks, benefits, limitations, and alternatives regarding  colonoscopy have been reviewed with the patient.  Questions have been answered.  All parties agreeable. ? ? ?Dillon Bellows, MD  01/24/2022, 12:58 PM ? ?

## 2022-01-24 NOTE — Anesthesia Preprocedure Evaluation (Signed)
Anesthesia Evaluation  ?Patient identified by MRN, date of birth, ID band ?Patient awake ? ? ? ?Reviewed: ?Allergy & Precautions, H&P , NPO status , Patient's Chart, lab work & pertinent test results, reviewed documented beta blocker date and time  ? ?History of Anesthesia Complications ?Negative for: history of anesthetic complications ? ?Airway ?Mallampati: III ? ?TM Distance: >3 FB ?Neck ROM: full ? ? ? Dental ? ?(+) Edentulous Upper, Edentulous Lower ?  ?Pulmonary ?COPD,  oxygen dependent, former smoker,  ?  ? ?+ decreased breath sounds ? ? ? ? ? Cardiovascular ?Exercise Tolerance: Good ?(-) hypertension(-) angina+ CAD, + Past MI and + Cardiac Stents  ?(-) CABG (-) dysrhythmias (-) Valvular Problems/Murmurs ?Rhythm:Regular Rate:Normal ?- Systolic murmurs ? ?NSTEMI 09/2021, no stents placed. On ASA and Plavix ?  ?Neuro/Psych ?negative neurological ROS ? negative psych ROS  ? GI/Hepatic ?negative GI ROS, Neg liver ROS,   ?Endo/Other  ?negative endocrine ROS ? Renal/GU ?negative Renal ROS  ?negative genitourinary ?  ?Musculoskeletal ? ? Abdominal ?  ?Peds ? Hematology ?negative hematology ROS ?(+)   ?Anesthesia Other Findings ?Past Medical History: ?No date: Coronary artery disease ?No date: Elevated cholesterol ?No date: Restless leg syndrome ? ? Reproductive/Obstetrics ?negative OB ROS ? ?  ? ? ? ? ? ? ? ? ? ? ? ? ? ?  ?  ? ? ? ? ? ? ? ? ?Anesthesia Physical ? ?Anesthesia Plan ? ?ASA: 4 ? ?Anesthesia Plan: General  ? ?Post-op Pain Management: Minimal or no pain anticipated  ? ?Induction: Intravenous ? ?PONV Risk Score and Plan: 2 and Propofol infusion ? ?Airway Management Planned: Natural Airway and Nasal Cannula ? ?Additional Equipment: None ? ?Intra-op Plan:  ? ?Post-operative Plan:  ? ?Informed Consent: I have reviewed the patients History and Physical, chart, labs and discussed the procedure including the risks, benefits and alternatives for the proposed anesthesia with the  patient or authorized representative who has indicated his/her understanding and acceptance.  ? ? ? ?Dental Advisory Given ? ?Plan Discussed with: Anesthesiologist, CRNA and Surgeon ? ?Anesthesia Plan Comments: (Discussed risks of anesthesia with patient, including possibility of difficulty with spontaneous ventilation under anesthesia necessitating airway intervention, PONV, and rare risks such as cardiac or respiratory or neurological events, and allergic reactions. Discussed the role of CRNA in patient's perioperative care. Patient understands. ?Patient counseled on being higher risk for anesthesia due to comorbidities: 4 months out from NSTEMI, O2-dependent pulm fibrosis. Patient was told about increased risk of cardiac and respiratory events, including death. ?)  ? ? ? ? ? ? ?Anesthesia Quick Evaluation ? ?

## 2022-01-24 NOTE — Anesthesia Procedure Notes (Signed)
Date/Time: 01/24/2022 1:06 PM ?Performed by: Johnna Acosta, CRNA ?Pre-anesthesia Checklist: Emergency Drugs available, Patient identified, Suction available, Patient being monitored and Timeout performed ?Patient Re-evaluated:Patient Re-evaluated prior to induction ?Oxygen Delivery Method: Nasal cannula ?Preoxygenation: Pre-oxygenation with 100% oxygen ?Induction Type: IV induction ? ? ? ? ?

## 2022-01-24 NOTE — Op Note (Signed)
[facility] ?Gastroenterology ?Patient Name: Dillon [name] ?Procedure Date: 01/24/2022 1:04 PM ?MRN: [id number] ?Account #: [id number] ?Date of Birth: 20-Sep-1956 ?Admit Type: Outpatient ?Age: 66 ?Room: [facility] ENDO ROOM 3 ?Gender: Male ?Note Status: Finalized ?Instrument Name: Colonoscope [id number] ?Procedure:             Colonoscopy ?Indications:           Surveillance: Personal history of adenomatous polyps  ?                       on last colonoscopy > 3 years ago ?Providers:             Jonathon Bellows MD, MD ?Referring MD:          [name], MD (Referring MD) ?Medicines:             Monitored Anesthesia Care ?Complications:         No immediate complications. ?Procedure:             Pre-Anesthesia Assessment: ?                       - Prior to the procedure, a History and Physical was  ?                       performed, and patient medications, allergies and  ?                       sensitivities were reviewed. The patient's tolerance  ?                       of previous anesthesia was reviewed. ?                       - The risks and benefits of the procedure and the  ?                       sedation options and risks were discussed with the  ?                       patient. All questions were answered and informed  ?                       consent was obtained. ?                       - ASA Grade Assessment: II - A patient with mild  ?                       systemic disease. ?                       After obtaining informed consent, the colonoscope was  ?                       passed under direct vision. Throughout the procedure,  ?                       the patient's blood pressure, pulse, and oxygen  ?  saturations were monitored continuously. The  ?                       Colonoscope was introduced through the anus and  ?                       advanced to the the cecum, identified by the  ?                       appendiceal orifice. The colonoscopy was performed  ?                        with ease. The patient tolerated the procedure well.  ?                       The quality of the bowel preparation was excellent. ?Findings: ?     The perianal and digital rectal examinations were normal. ?     Two sessile polyps were found in the ascending colon. The polyps were 4  ?     to 5 mm in size. These polyps were removed with a cold snare. Resection  ?     and retrieval were complete. ?     A 4 mm polyp was found in the cecum. The polyp was sessile. The polyp  ?     was removed with a cold snare. Resection and retrieval were complete. ?     Internal hemorrhoids were found during retroflexion. The hemorrhoids  ?     were large and Grade I (internal hemorrhoids that do not prolapse). ?     The exam was otherwise without abnormality on direct and retroflexion  ?     views. ?Impression:            - Two 4 to 5 mm polyps in the ascending colon, removed  ?                       with a cold snare. Resected and retrieved. ?                       - One 4 mm polyp in the cecum, removed with a cold  ?                       snare. Resected and retrieved. ?                       - Internal hemorrhoids. ?                       - The examination was otherwise normal on direct and  ?                       retroflexion views. ?Recommendation:        - Discharge patient to home (with escort). ?                       - Resume previous diet. ?                       - Continue present medications. ?                       -  Await pathology results. ?                       - Repeat colonoscopy for surveillance based on  ?                       pathology results. ?Procedure Code(s):     --- Professional --- ?                       [phone], Colonoscopy, flexible; with removal of  ?                       tumor(s), polyp(s), or other lesion(s) by snare  ?                       technique ?Diagnosis Code(s):     --- Professional --- ?                       K63.5, Polyp of colon ?                       Z86.010, Personal history of  colonic polyps ?                       K64.0, First degree hemorrhoids ?CPT copyright 2019 American Medical Association. All rights reserved. ?The codes documented in this report are preliminary and upon coder review may  ?be revised to meet current compliance requirements. ?Jonathon Bellows, MD ?Jonathon Bellows MD, MD ?01/24/2022 1:26:23 PM ?This report has been signed electronically. ?Number of Addenda: 0 ?Note Initiated On: 01/24/2022 1:04 PM ?Scope Withdrawal Time: 0 hours 12 minutes 34 seconds  ?Total Procedure Duration: 0 hours 14 minutes 48 seconds  ?Estimated Blood Loss:  Estimated blood loss: none. ?     [facility] ?

## 2022-01-24 NOTE — Transfer of Care (Signed)
Immediate Anesthesia Transfer of Care Note ? ?Patient: Dillon [name] ? ?Procedure(s) Performed: COLONOSCOPY WITH PROPOFOL ? ?Patient Location: PACU ? ?Anesthesia Type:General ? ?Level of Consciousness: sedated ? ?Airway & Oxygen Therapy: Patient Spontanous Breathing ? ?Post-op Assessment: Report given to RN and Post -op Vital signs reviewed and stable ? ?Post vital signs: Reviewed and stable ? ?Last Vitals:  ?Vitals Value Taken Time  ?BP 88/49 01/24/22 1329  ?Temp 36.1 ?C 01/24/22 1328  ?Pulse 77 01/24/22 1329  ?Resp 20 01/24/22 1329  ?SpO2 95 % 01/24/22 1329  ? ? ?Last Pain:  ?Vitals:  ? 01/24/22 1328  ?TempSrc: Temporal  ?PainSc: Asleep  ?   ? ?  ? ?Complications: No notable events documented. ?

## 2022-01-26 NOTE — Progress Notes (Signed)
Spoke with Patient's Spouse Mardene Celeste) who was with him the day of Procedure.  She states that he was Very Pleased with the Care he Received. ?

## 2022-01-27 ENCOUNTER — Encounter: Payer: Self-pay | Admitting: Gastroenterology

## 2022-01-27 LAB — SURGICAL PATHOLOGY

## 2022-01-28 ENCOUNTER — Encounter: Payer: Self-pay | Admitting: Gastroenterology

## 2022-02-04 ENCOUNTER — Ambulatory Visit (INDEPENDENT_AMBULATORY_CARE_PROVIDER_SITE_OTHER): Payer: BC Managed Care – PPO | Admitting: Internal Medicine

## 2022-02-04 ENCOUNTER — Encounter: Payer: Self-pay | Admitting: Internal Medicine

## 2022-02-04 VITALS — BP 108/61 | HR 67 | Ht 74.0 in | Wt 186.0 lb

## 2022-02-04 DIAGNOSIS — E785 Hyperlipidemia, unspecified: Secondary | ICD-10-CM | POA: Diagnosis not present

## 2022-02-04 DIAGNOSIS — J8410 Pulmonary fibrosis, unspecified: Secondary | ICD-10-CM

## 2022-02-04 DIAGNOSIS — I10 Essential (primary) hypertension: Secondary | ICD-10-CM | POA: Diagnosis not present

## 2022-02-04 DIAGNOSIS — M67912 Unspecified disorder of synovium and tendon, left shoulder: Secondary | ICD-10-CM | POA: Diagnosis not present

## 2022-02-04 NOTE — Assessment & Plan Note (Signed)
Patient is on home oxygen ?

## 2022-02-04 NOTE — Assessment & Plan Note (Signed)

## 2022-02-04 NOTE — Progress Notes (Signed)
? ?Established Patient Office Visit ? ?Subjective:  ?Patient ID: Dillon [name], male    DOB: [birth year]  Age: 66 y.o. MRN: [id number] ? ?CC:  ?Chief Complaint  ?Patient presents with  ? Follow-up  ?  Patient is here for a 1 month follow up  ? ? ?HPI ? 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  He can do light duties at home. ? ?Past Medical History:  ?Diagnosis Date  ? Coronary artery disease   ? Elevated cholesterol   ? Myocardial infarction [facility])   ? Pulmonary fibrosis ([place])   ? Restless leg syndrome   ? ? ?Past Surgical History:  ?Procedure Laterality Date  ? ANGIOPLASTY  2010  ? 2 stents placed  ? BACK SURGERY    ? cardiac stents    ? x2  ? COLONOSCOPY    ? COLONOSCOPY WITH PROPOFOL N/A 03/05/2018  ? Procedure: COLONOSCOPY WITH PROPOFOL;  Surgeon: Virgel Manifold, MD;  Location: [facility] ENDOSCOPY;  Service: Endoscopy;  Laterality: N/A;  ? COLONOSCOPY WITH PROPOFOL N/A 01/24/2022  ? Procedure: COLONOSCOPY WITH PROPOFOL;  Surgeon: Jonathon Bellows, MD;  Location: [facility] ENDOSCOPY;  Service: Gastroenterology;  Laterality: N/A;  ? CORONARY ANGIOPLASTY    ? CORONARY/GRAFT ACUTE MI REVASCULARIZATION N/A 09/30/2019  ? Procedure: Coronary/Graft Acute MI Revascularization;  Surgeon: Wellington Hampshire, MD;  Location: [place] [facility];  Service: Cardiovascular;  Laterality: N/A;  ? LEFT HEART CATH AND CORONARY ANGIOGRAPHY N/A 09/30/2019  ? Procedure: LEFT HEART CATH AND CORONARY ANGIOGRAPHY;  Surgeon: Wellington Hampshire, MD;  Location: [place] [facility];  Service: Cardiovascular;  Laterality: N/A;  ? LEFT HEART CATH AND CORS/GRAFTS ANGIOGRAPHY N/A 10/01/2021  ? Procedure: LEFT HEART CATH AND CORS/GRAFTS ANGIOGRAPHY;  Surgeon: Nelva Bush, MD;  Location: [place] [facility];  Service: Cardiovascular;  Laterality: N/A;  ? ? ?Family History  ?Problem Relation Age of Onset  ? Heart disease Mother   ?  Heart disease Father   ? ? ?Social History  ? ?Socioeconomic History  ? Marital status: Married  ?  Spouse name: Not on file  ? Number of children: Not on file  ? Years of education: Not on file  ? Highest education level: Not on file  ?Occupational History  ? Not on file  ?Tobacco Use  ? Smoking status: Former  ?  Types: Cigarettes  ?  Quit date: 03/05/2009  ?  Years since quitting: 12.9  ? Smokeless tobacco: Never  ?Vaping Use  ? Vaping Use: Never used  ?Substance and Sexual Activity  ? Alcohol use: Not Currently  ?  Comment: occassionally - 1 beer/month or less  ? Drug use: Not Currently  ? Sexual activity: Not Currently  ?Other Topics Concern  ? Not on file  ?Social History Narrative  ? Lives locally with wife.  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  ? ?Social Determinants of Health  ? ?Financial Resource Strain: Not on file  ?Food Insecurity: Not on file  ?Transportation Needs: Not on file  ?Physical Activity: Not on file  ?Stress: Not on file  ?Social Connections: Not on file  ?Intimate Partner Violence: Not on file  ? ? ? ?Current Outpatient Medications:  ?  acetaminophen (TYLENOL) 500 MG tablet, Take 1,000 mg by mouth every 8 (eight) hours as needed for moderate pain., Disp: , Rfl:  ?  ALPHA LIPOIC  ACID PO, Take 500-1,000 mg by mouth daily., Disp: , Rfl:  ?  aspirin EC 81 MG tablet, Take 81 mg by mouth daily. Swallow whole., Disp: , Rfl:  ?  atorvastatin (LIPITOR) 40 MG tablet, TAKE 1 TABLET BY MOUTH EVERY DAY, Disp: 90 tablet, Rfl: 2 ?  carvedilol (COREG) 3.125 MG tablet, TAKE 1 TABLET (3.125 MG TOTAL) BY MOUTH 2 (TWO) TIMES DAILY WITH A MEAL., Disp: 180 tablet, Rfl: 2 ?  clopidogrel (PLAVIX) 75 MG tablet, Take 37.5 mg by mouth every other day., Disp: , Rfl:  ?  ezetimibe (ZETIA) 10 MG tablet, Take 1 tablet (10 mg total) by mouth daily., Disp: 30 tablet, Rfl: 2 ?  Fluticasone-Umeclidin-Vilant (TRELEGY ELLIPTA) 100-62.5-25 MCG/INH AEPB, Inhale 1 puff into the lungs daily.,  Disp: , Rfl:  ?  isosorbide mononitrate (IMDUR) 30 MG 24 hr tablet, Take 15 mg by mouth daily., Disp: , Rfl:  ?  nitroGLYCERIN (NITROSTAT) 0.4 MG SL tablet, Place 1 tablet (0.4 mg total) under the tongue every 5 (five) minutes x 3 doses as needed for chest pain., Disp: 20 tablet, Rfl: 12 ?  Pirfenidone (ESBRIET) 267 MG CAPS, Take 3 capsules by mouth 3 (three) times daily., Disp: , Rfl:  ?  polyethylene glycol-electrolytes (NULYTELY) 420 g solution, Prepare according to package instructions. Starting at 5:00 PM: Drink one 8 oz glass of mixture every 15 minutes until you finish half of the jug. Five hours prior to procedure, drink 8 oz glass of mixture every 15 minutes until it is all gone. Make sure you do not drink anything 4 hours prior to your procedure., Disp: 4000 mL, Rfl: 0  ? ?Allergies  ?Allergen Reactions  ? Ace Inhibitors Swelling  ?  Causes cough and swelling  ? ? ?ROS ?Review of Systems  ?Constitutional: Negative.   ?HENT: Negative.    ?Eyes: Negative.   ?Respiratory: Negative.    ?Cardiovascular: Negative.   ?Gastrointestinal: Negative.   ?Endocrine: Negative.   ?Genitourinary: Negative.   ?Musculoskeletal: Negative.   ?Skin: Negative.   ?Allergic/Immunologic: Negative.   ?Neurological: Negative.   ?Hematological: Negative.   ?Psychiatric/Behavioral: Negative.    ?All other systems reviewed and are negative. ? ?  ?Objective:  ?  ?Physical Exam ?Vitals reviewed.  ?Constitutional:   ?   Appearance: Normal appearance.  ?HENT:  ?   Mouth/Throat:  ?   Mouth: Mucous membranes are moist.  ?Eyes:  ?   Pupils: Pupils are equal, round, and reactive to light.  ?Neck:  ?   Vascular: No carotid bruit.  ?Cardiovascular:  ?   Rate and Rhythm: Normal rate and regular rhythm.  ?   Pulses: Normal pulses.  ?   Heart sounds: Normal heart sounds.  ?Pulmonary:  ?   Effort: Pulmonary effort is normal.  ?   Breath sounds: Normal breath sounds.  ?Abdominal:  ?   General: Bowel sounds are normal.  ?   Palpations: Abdomen is soft.  There is no hepatomegaly, splenomegaly or mass.  ?   Tenderness: There is no abdominal tenderness.  ?   Hernia: No hernia is present.  ?Musculoskeletal:  ?   Cervical back: Neck supple.  ?   Right lower leg: No edema.  ?   Left lower leg: No edema.  ?Skin: ?   Findings: No rash.  ?Neurological:  ?   Mental Status: He is alert and oriented to person, place, and time.  ?   Motor: No weakness.  ?Psychiatric:     ?  Mood and Affect: Mood normal.     ?   Behavior: Behavior normal.  ? ? ?BP 108/61   Pulse 67   Ht '6\' 2"'  (1.88 m)   Wt 186 lb (84.4 kg)   BMI 23.88 kg/m?  ?Wt Readings from Last 3 Encounters:  ?02/04/22 186 lb (84.4 kg)  ?01/24/22 185 lb (83.9 kg)  ?01/20/22 182 lb (82.6 kg)  ? ? ? ?Health Maintenance Due  ?Topic Date Due  ? Hepatitis C Screening  Never done  ? Zoster Vaccines- Shingrix (1 of 2) Never done  ? Pneumonia Vaccine 42+ Years old (2 - PCV) 10/02/2020  ? COVID-19 Vaccine (4 - Booster for [place] series) 10/31/2020  ? ? ?There are no preventive care reminders to display for this patient. ? ?Lab Results  ?Component Value Date  ? TSH 2.430 09/30/2021  ? ?Lab Results  ?Component Value Date  ? WBC 7.4 10/02/2021  ? HGB 13.6 10/02/2021  ? HCT 40.6 10/02/2021  ? MCV 99.8 10/02/2021  ? PLT 153 10/02/2021  ? ?Lab Results  ?Component Value Date  ? NA 134 (L) 10/02/2021  ? K 4.6 10/02/2021  ? CO2 28 10/02/2021  ? GLUCOSE 106 (H) 10/02/2021  ? BUN 13 10/02/2021  ? CREATININE 0.64 10/02/2021  ? BILITOT 0.5 07/01/2021  ? ALKPHOS 88 07/01/2021  ? AST 18 07/01/2021  ? ALT 21 07/01/2021  ? PROT 8.6 (H) 07/01/2021  ? ALBUMIN 3.7 07/01/2021  ? CALCIUM 8.5 (L) 10/02/2021  ? ANIONGAP 4 (L) 10/02/2021  ? EGFR 102 06/12/2021  ? ?Lab Results  ?Component Value Date  ? CHOL 118 10/01/2021  ? ?Lab Results  ?Component Value Date  ? HDL 45 10/01/2021  ? ?Lab Results  ?Component Value Date  ? [place] 64 10/01/2021  ? ?Lab Results  ?Component Value Date  ? TRIG 45 10/01/2021  ? ?Lab Results  ?Component Value Date  ? CHOLHDL  2.6 10/01/2021  ? ?Lab Results  ?Component Value Date  ? HGBA1C 5.7 (H) 09/30/2021  ? ? ?  ?Assessment & Plan:  ? ?Problem List Items Addressed This Visit   ? ?  ? Cardiovascular and Mediastinum  ? Essential hyper

## 2022-02-04 NOTE — Assessment & Plan Note (Signed)
Chronic stable problem ?

## 2022-02-04 NOTE — Assessment & Plan Note (Signed)
Hypercholesterolemia  I advised the patient to follow Mediterranean diet This diet is rich in fruits vegetables and whole grain, and This diet is also rich in fish and lean meat Patient should also eat a handful of almonds or walnuts daily Recent heart study indicated that average follow-up on this kind of diet reduces the cardiovascular mortality by 50 to 70%== 

## 2022-02-11 DIAGNOSIS — I700 Atherosclerosis of aorta: Secondary | ICD-10-CM | POA: Diagnosis not present

## 2022-02-11 DIAGNOSIS — J449 Chronic obstructive pulmonary disease, unspecified: Secondary | ICD-10-CM | POA: Diagnosis not present

## 2022-02-11 DIAGNOSIS — J439 Emphysema, unspecified: Secondary | ICD-10-CM | POA: Diagnosis not present

## 2022-02-15 ENCOUNTER — Other Ambulatory Visit: Payer: Self-pay | Admitting: Cardiovascular Disease

## 2022-02-15 DIAGNOSIS — I2510 Atherosclerotic heart disease of native coronary artery without angina pectoris: Secondary | ICD-10-CM

## 2022-02-20 ENCOUNTER — Ambulatory Visit (INDEPENDENT_AMBULATORY_CARE_PROVIDER_SITE_OTHER): Payer: BC Managed Care – PPO | Admitting: Gastroenterology

## 2022-02-20 ENCOUNTER — Encounter: Payer: Self-pay | Admitting: Gastroenterology

## 2022-02-20 VITALS — BP 156/92 | HR 67 | Temp 97.9°F | Ht 74.0 in | Wt 184.8 lb

## 2022-02-20 DIAGNOSIS — K648 Other hemorrhoids: Secondary | ICD-10-CM | POA: Diagnosis not present

## 2022-02-20 NOTE — Progress Notes (Signed)
Patient follow-ups today for banding of hemorrhoids ? ? ? ?Summary of history : ?He has had a colonoscopy back in May 2019 by Dr. Bonna Gains.  At that time it was done for colon cancer screening and rectal bleeding few subcentimeter polyps were taken out of the rectum in the ascending colon.  Patchy area of inflammation is seen in the rectum which was biopsied.  Internal hemorrhoids were noted which I can see clearly on the pictures.  It is felt that the hemorrhoids were the source of internal bleeding.  Found to have 1 sessile serrated adenoma and 3 tubular adenomas.  Was recommended to have repeat colonoscopy in 3 years which he is overdue.He states that has been having longstanding issues with rectal bleeding, perianal discomfort particularly when he wipes.  Denies any constipation but states that he has to strain at times to have a good bowel movement. ? ?01/24/2022 underwent colonoscopy: 3 sessile polyps seen that was subcentimeter.  Resected.  Internal hemorrhoids noted that were large.  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  Repeat colonoscopy in 3 years ? ? ? ?Interval history 01/20/2022-02/20/2022 ? ?He is doing well.  Wants to get his hemorrhoids banded causing him discomfort in the perianal area. ? ?Digital rectal exam performed in the presence of a chaperone. ?External anal findings: none ?Internal findings: , No masses, no blood on glove noticed. ? ? ? ?PROCEDURE NOTE: ?The patient presents with symptomatic grade 1 hemorrhoids, unresponsive to maximal medical therapy, requesting rubber band ligation of his/her hemorrhoidal disease.  All risks, benefits and alternative forms of therapy were described and informed consent was obtained. ? ?In the Left Lateral Decubitus position (if anoscopy is performed) anoscopic examination revealed grade 1 hemorrhoids in the LL position(s).  ? ?The decision was made to band the RA internal hemorrhoid, and the Torreon O?Regan System was used to perform band  ligation without complication.  Digital anorectal examination was then performed to assure proper positioning of the band, and to adjust the banded tissue as required.  The patient was discharged home without pain or other issues.  Dietary and behavioral recommendations were given and (if necessary - prescriptions were given), along with follow-up instructions.  The patient will return 4 weeks for follow-up and possible additional banding as required. ? ?No complications were encountered and the patient tolerated the procedure well. ? ? ?Plan: ? ?Avoid constipation.  Commence on stool softeners if not already on ? ?Follow-up: 4 weeks ? ?Dr Jonathon Bellows MD,MRCP [facility]) ?Gastroenterology/Hepatology ?Pager: [phone] ?  ?

## 2022-03-06 ENCOUNTER — Encounter: Payer: 59 | Admitting: Dermatology

## 2022-03-13 DIAGNOSIS — J439 Emphysema, unspecified: Secondary | ICD-10-CM | POA: Diagnosis not present

## 2022-03-13 DIAGNOSIS — I700 Atherosclerosis of aorta: Secondary | ICD-10-CM | POA: Diagnosis not present

## 2022-03-13 DIAGNOSIS — J449 Chronic obstructive pulmonary disease, unspecified: Secondary | ICD-10-CM | POA: Diagnosis not present

## 2022-03-19 ENCOUNTER — Other Ambulatory Visit: Payer: Self-pay | Admitting: Internal Medicine

## 2022-04-07 ENCOUNTER — Encounter: Payer: Self-pay | Admitting: Gastroenterology

## 2022-04-07 ENCOUNTER — Ambulatory Visit (INDEPENDENT_AMBULATORY_CARE_PROVIDER_SITE_OTHER): Payer: BC Managed Care – PPO | Admitting: Gastroenterology

## 2022-04-07 VITALS — BP 136/73 | HR 76 | Temp 98.7°F | Wt 177.0 lb

## 2022-04-07 DIAGNOSIS — K648 Other hemorrhoids: Secondary | ICD-10-CM

## 2022-04-07 NOTE — Progress Notes (Signed)
Patient follow-ups today for banding of hemorrhoids    Summary of history :    01/24/2022 underwent colonoscopy: 3 sessile polyps seen that was subcentimeter.  Resected.  Internal hemorrhoids noted that were large.  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  Repeat colonoscopy in 3 years. Symptomatic hemorrhoids non responsive to conservative management.   First round:02/20/2022: RA column banded     Interval history   02/20/2022-04/07/2022  Doing well no complaints feels well  Digital rectal exam performed in the presence of a chaperone. External anal findings: no  Internal findings: , No masses, no blood on glove noticed.    PROCEDURE NOTE: The patient presents with symptomatic grade 1 hemorrhoids, unresponsive to maximal medical therapy, requesting rubber band ligation of his/her hemorrhoidal disease.  All risks, benefits and alternative forms of therapy were described and informed consent was obtained.  In the Left Lateral Decubitus position (if anoscopy is performed) anoscopic examination revealed grade 1 hemorrhoids in the RP and LL position(s).   The decision was made to band the LL internal hemorrhoid, and the [place] was used to perform band ligation without complication.  Digital anorectal examination was then performed to assure proper positioning of the band, and to adjust the banded tissue as required.  The patient was discharged home without pain or other issues.  Dietary and behavioral recommendations were given and (if necessary - prescriptions were given), along with follow-up instructions.  The patient will return 4 weeks for follow-up and possible additional banding as required.  No complications were encountered and the patient tolerated the procedure well.   Plan:  Avoid constipation.  Commence on stool softeners if not already on  Follow-up:4 weeks   Dr Jonathon Bellows MD,MRCP [facility]) Gastroenterology/Hepatology Pager: [phone]

## 2022-04-08 ENCOUNTER — Ambulatory Visit: Payer: BC Managed Care – PPO | Admitting: Internal Medicine

## 2022-04-08 ENCOUNTER — Encounter: Payer: Self-pay | Admitting: Internal Medicine

## 2022-04-08 VITALS — BP 123/73 | HR 72 | Ht 74.0 in | Wt 182.4 lb

## 2022-04-08 DIAGNOSIS — J8410 Pulmonary fibrosis, unspecified: Secondary | ICD-10-CM | POA: Diagnosis not present

## 2022-04-08 DIAGNOSIS — R7301 Impaired fasting glucose: Secondary | ICD-10-CM

## 2022-04-08 DIAGNOSIS — J449 Chronic obstructive pulmonary disease, unspecified: Secondary | ICD-10-CM | POA: Diagnosis not present

## 2022-04-08 DIAGNOSIS — E785 Hyperlipidemia, unspecified: Secondary | ICD-10-CM

## 2022-04-08 DIAGNOSIS — I10 Essential (primary) hypertension: Secondary | ICD-10-CM

## 2022-04-08 DIAGNOSIS — M67912 Unspecified disorder of synovium and tendon, left shoulder: Secondary | ICD-10-CM

## 2022-04-08 NOTE — Assessment & Plan Note (Signed)
The following hypertensive lifestyle modification were recommended and discussed:  1. Limiting alcohol intake to less than 1 oz/day of ethanol:(24 oz of beer or 8 oz of wine or 2 oz of 100-proof whiskey). 2. Take baby ASA 81 mg daily. 3. Importance of regular aerobic exercise and losing weight. 4. Reduce dietary saturated fat and cholesterol intake for overall cardiovascular health. 5. Maintaining adequate dietary potassium, calcium, and magnesium intake. 6. Regular monitoring of the blood pressure. 7. Reduce sodium intake to less than 100 mmol/day (less than 2.3 gm of sodium or less than 6 gm of sodium choride) His heart is regular chest is clear without any rhonchi.  Abdomen is nontender.  He is little bit shortness of breath on oxygen

## 2022-04-08 NOTE — Assessment & Plan Note (Signed)
Hypercholesterolemia  I advised the patient to follow Mediterranean diet This diet is rich in fruits vegetables and whole grain, and This diet is also rich in fish and lean meat Patient should also eat a handful of almonds or walnuts daily Recent heart study indicated that average follow-up on this kind of diet reduces the cardiovascular mortality by 50 to 70%== 

## 2022-04-08 NOTE — Assessment & Plan Note (Signed)
Stable at the present time. 

## 2022-04-08 NOTE — Progress Notes (Signed)
Established Patient Office Visit  Subjective:  Patient ID: Dillon [name], male    DOB: 06-30-1956  Age: 66 y.o. MRN: [id number]  CC:  Chief Complaint  Patient presents with   Hypertension    Patient is here for his 2 month BP follow up    HPI  Dillon [name] presents for sob ,pt is in oxygen, patient ran out of his medicine, he denies any chest pain complains of shortness of breath with exertion.  He was found to be on oxygen.  There is no wheezing noted, he has a diagnosis of pulmonary fibrosis, he is also known to have coronary artery disease and has had 2 stent placed with a history of back surgery he had a colonoscopy in the past.  Past Medical History:  Diagnosis Date   Coronary artery disease    Elevated cholesterol    Myocardial infarction ([place])    Pulmonary fibrosis ([place])    Restless leg syndrome     Past Surgical History:  Procedure Laterality Date   ANGIOPLASTY  2010   2 stents placed   BACK SURGERY     cardiac stents     x2   COLONOSCOPY     COLONOSCOPY WITH PROPOFOL N/A 03/05/2018   Procedure: COLONOSCOPY WITH PROPOFOL;  Surgeon: Virgel Manifold, MD;  Location: [facility] ENDOSCOPY;  Service: Endoscopy;  Laterality: N/A;   COLONOSCOPY WITH PROPOFOL N/A 01/24/2022   Procedure: COLONOSCOPY WITH PROPOFOL;  Surgeon: Jonathon Bellows, MD;  Location: [facility] ENDOSCOPY;  Service: Gastroenterology;  Laterality: N/A;   CORONARY ANGIOPLASTY     CORONARY/GRAFT ACUTE MI REVASCULARIZATION N/A 09/30/2019   Procedure: Coronary/Graft Acute MI Revascularization;  Surgeon: Wellington Hampshire, MD;  Location: [place] [facility] LAB;  Service: Cardiovascular;  Laterality: N/A;   LEFT HEART CATH AND CORONARY ANGIOGRAPHY N/A 09/30/2019   Procedure: LEFT HEART CATH AND CORONARY ANGIOGRAPHY;  Surgeon: Wellington Hampshire, MD;  Location: [place] [facility];  Service: Cardiovascular;  Laterality: N/A;   LEFT HEART CATH AND CORS/GRAFTS ANGIOGRAPHY N/A 10/01/2021   Procedure: LEFT HEART CATH AND  CORS/GRAFTS ANGIOGRAPHY;  Surgeon: Nelva Bush, MD;  Location: [place] [facility];  Service: Cardiovascular;  Laterality: N/A;    Family History  Problem Relation Age of Onset   Heart disease Mother    Heart disease Father     Social History   Socioeconomic History   Marital status: Married    Spouse name: Not on file   Number of children: Not on file   Years of education: Not on file   Highest education level: Not on file  Occupational History   Not on file  Tobacco Use   Smoking status: Former    Types: Cigarettes    Quit date: 03/05/2009    Years since quitting: 13.1   Smokeless tobacco: Never  Vaping Use   Vaping Use: Never used  Substance and Sexual Activity   Alcohol use: Not Currently    Comment: occassionally - 1 beer/month or less   Drug use: Not Currently   Sexual activity: Not Currently  Other Topics Concern   Not on file  Social History Narrative   Lives locally with wife.  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   Social Determinants of Health   Financial Resource Strain: Not on file  Food Insecurity: Not on file  Transportation Needs: Not on file  Physical Activity: Not on file  Stress: Not on file  Social Connections: Not  on file  Intimate Partner Violence: Not on file     Current Outpatient Medications:    acetaminophen (TYLENOL) 500 MG tablet, Take 1,000 mg by mouth every 8 (eight) hours as needed for moderate pain., Disp: , Rfl:    ALPHA LIPOIC ACID PO, Take 500-1,000 mg by mouth daily., Disp: , Rfl:    aspirin EC 81 MG tablet, Take 81 mg by mouth daily. Swallow whole., Disp: , Rfl:    atorvastatin (LIPITOR) 40 MG tablet, TAKE 1 TABLET BY MOUTH EVERY DAY, Disp: 90 tablet, Rfl: 2   carvedilol (COREG) 3.125 MG tablet, TAKE 1 TABLET (3.125 MG TOTAL) BY MOUTH 2 (TWO) TIMES DAILY WITH A MEAL., Disp: 180 tablet, Rfl: 0   clopidogrel (PLAVIX) 75 MG tablet, Take 37.5 mg by mouth every other day., Disp: , Rfl:     ezetimibe (ZETIA) 10 MG tablet, Take 1 tablet (10 mg total) by mouth daily., Disp: 30 tablet, Rfl: 2   Fluticasone-Umeclidin-Vilant (TRELEGY ELLIPTA) 100-62.5-25 MCG/INH AEPB, Inhale 1 puff into the lungs daily., Disp: , Rfl:    isosorbide mononitrate (IMDUR) 30 MG 24 hr tablet, Take 15 mg by mouth daily., Disp: , Rfl:    nitroGLYCERIN (NITROSTAT) 0.4 MG SL tablet, Place 1 tablet (0.4 mg total) under the tongue every 5 (five) minutes x 3 doses as needed for chest pain., Disp: 20 tablet, Rfl: 12   Pirfenidone (ESBRIET) 267 MG CAPS, Take 3 capsules by mouth 3 (three) times daily., Disp: , Rfl:    Allergies  Allergen Reactions   Ace Inhibitors Swelling    Causes cough and swelling    ROS Review of Systems  Constitutional: Negative.   HENT: Negative.    Eyes: Negative.   Respiratory:  Positive for shortness of breath. Negative for cough, choking, chest tightness and wheezing.   Cardiovascular: Negative.  Negative for chest pain, palpitations and leg swelling.  Gastrointestinal: Negative.   Endocrine: Negative.   Genitourinary: Negative.   Musculoskeletal: Negative.   Skin: Negative.   Allergic/Immunologic: Negative.   Neurological: Negative.   Hematological: Negative.   Psychiatric/Behavioral: Negative.  Negative for agitation and behavioral problems.   All other systems reviewed and are negative.     Objective:    Physical Exam Vitals reviewed.  Constitutional:      Appearance: Normal appearance.  HENT:     Mouth/Throat:     Mouth: Mucous membranes are moist.  Eyes:     Pupils: Pupils are equal, round, and reactive to light.  Neck:     Vascular: No carotid bruit.  Cardiovascular:     Rate and Rhythm: Normal rate. Rhythm irregular.     Pulses: Normal pulses.     Heart sounds: Normal heart sounds.  Pulmonary:     Breath sounds: Normal breath sounds. No wheezing or rhonchi.     Comments: Patient is on oxygen, he was accompanied with his wife. Abdominal:     General:  Bowel sounds are normal.     Palpations: Abdomen is soft. There is no hepatomegaly, splenomegaly or mass.     Tenderness: There is no abdominal tenderness.     Hernia: No hernia is present.  Musculoskeletal:     Cervical back: Neck supple.     Right lower leg: No edema.     Left lower leg: No edema.  Skin:    Findings: No rash.  Neurological:     Mental Status: He is alert and oriented to person, place, and time.  Motor: No weakness.  Psychiatric:        Mood and Affect: Mood normal.        Behavior: Behavior normal.     BP 123/73   Pulse 72   Ht '6\' 2"'  (1.88 m)   Wt 182 lb 6.4 oz (82.7 kg)   SpO2 92% Comment: Pt on 2L of O2  BMI 23.42 kg/m  Wt Readings from Last 3 Encounters:  04/08/22 182 lb 6.4 oz (82.7 kg)  04/07/22 177 lb (80.3 kg)  02/20/22 184 lb 12.8 oz (83.8 kg)     Health Maintenance Due  Topic Date Due   Hepatitis C Screening  Never done   Zoster Vaccines- Shingrix (1 of 2) Never done   Pneumonia Vaccine 75+ Years old (2 - PCV) 10/02/2020   COVID-19 Vaccine (4 - Booster for Ruch series) 10/31/2020    There are no preventive care reminders to display for this patient.  Lab Results  Component Value Date   TSH 2.430 09/30/2021   Lab Results  Component Value Date   WBC 7.4 10/02/2021   HGB 13.6 10/02/2021   HCT 40.6 10/02/2021   MCV 99.8 10/02/2021   PLT 153 10/02/2021   Lab Results  Component Value Date   NA 134 (L) 10/02/2021   K 4.6 10/02/2021   CO2 28 10/02/2021   GLUCOSE 106 (H) 10/02/2021   BUN 13 10/02/2021   CREATININE 0.64 10/02/2021   BILITOT 0.5 07/01/2021   ALKPHOS 88 07/01/2021   AST 18 07/01/2021   ALT 21 07/01/2021   PROT 8.6 (H) 07/01/2021   ALBUMIN 3.7 07/01/2021   CALCIUM 8.5 (L) 10/02/2021   ANIONGAP 4 (L) 10/02/2021   EGFR 102 06/12/2021   Lab Results  Component Value Date   CHOL 118 10/01/2021   Lab Results  Component Value Date   HDL 45 10/01/2021   Lab Results  Component Value Date   LDLCALC 64  10/01/2021   Lab Results  Component Value Date   TRIG 45 10/01/2021   Lab Results  Component Value Date   CHOLHDL 2.6 10/01/2021   Lab Results  Component Value Date   HGBA1C 5.7 (H) 09/30/2021      Assessment & Plan:   Problem List Items Addressed This Visit       Cardiovascular and Mediastinum   Essential hypertension - Primary    The following hypertensive lifestyle modification were recommended and discussed:  1. Limiting alcohol intake to less than 1 oz/day of ethanol:(24 oz of beer or 8 oz of wine or 2 oz of 100-proof whiskey). 2. Take baby ASA 81 mg daily. 3. Importance of regular aerobic exercise and losing weight. 4. Reduce dietary saturated fat and cholesterol intake for overall cardiovascular health. 5. Maintaining adequate dietary potassium, calcium, and magnesium intake. 6. Regular monitoring of the blood pressure. 7. Reduce sodium intake to less than 100 mmol/day (less than 2.3 gm of sodium or less than 6 gm of sodium choride) His heart is regular chest is clear without any rhonchi.  Abdomen is nontender.  He is little bit shortness of breath on oxygen        Respiratory   Pulmonary fibrosis ([place])   Relevant Orders   Ambulatory referral to Pulmonology   Chronic obstructive pulmonary disease ([place])    Refer to the pulmonary specialist        Endocrine   Impaired fasting glucose    Patient was advised to watch his diet  Musculoskeletal and Integument   Disorder of left rotator cuff    Stable at the present time        Other   Hyperlipidemia    Hypercholesterolemia  I advised the patient to follow Mediterranean diet This diet is rich in fruits vegetables and whole grain, and This diet is also rich in fish and lean meat Patient should also eat a handful of almonds or walnuts daily Recent heart study indicated that average follow-up on this kind of diet reduces the cardiovascular mortality by 50 to 70%==       No orders of the defined  types were placed in this encounter.   Follow-up: No follow-ups on file.    [name], MD

## 2022-04-08 NOTE — Assessment & Plan Note (Signed)
Patient was advised to watch his diet

## 2022-04-08 NOTE — Assessment & Plan Note (Signed)
Refer to the pulmonary specialist

## 2022-04-09 ENCOUNTER — Encounter: Payer: Self-pay | Admitting: Cardiovascular Disease

## 2022-04-09 ENCOUNTER — Ambulatory Visit (INDEPENDENT_AMBULATORY_CARE_PROVIDER_SITE_OTHER): Payer: BC Managed Care – PPO | Admitting: Cardiovascular Disease

## 2022-04-09 VITALS — BP 120/70 | HR 73 | Ht 74.0 in | Wt 183.5 lb

## 2022-04-09 DIAGNOSIS — I2510 Atherosclerotic heart disease of native coronary artery without angina pectoris: Secondary | ICD-10-CM | POA: Diagnosis not present

## 2022-04-09 DIAGNOSIS — I10 Essential (primary) hypertension: Secondary | ICD-10-CM | POA: Diagnosis not present

## 2022-04-09 DIAGNOSIS — R0602 Shortness of breath: Secondary | ICD-10-CM

## 2022-04-09 DIAGNOSIS — J849 Interstitial pulmonary disease, unspecified: Secondary | ICD-10-CM | POA: Diagnosis not present

## 2022-04-09 DIAGNOSIS — E785 Hyperlipidemia, unspecified: Secondary | ICD-10-CM | POA: Diagnosis not present

## 2022-04-09 NOTE — Progress Notes (Signed)
Cardiology Office Note   Date:  04/09/2022   ID:  Dillon [name], DOB October 07, 1956, MRN [id number]  PCP:  Dillon Athens, MD  Cardiologist:   Dillon Sacramento, MD   Chief Complaint  Patient presents with   Other    6 month f/u c/o sob with exertion. Pt d/c isosorbide due to headaches. Meds reviewed verbally with pt.      History of Present Illness: Dillon [name] is a 66 y.o. male who presents for follow-up visit regarding coronary artery disease. He has known history of coronary artery disease, hyperlipidemia, pulmonary fibrosis and previous tobacco use. He presented in December 2020 with ST elevation myocardial infarction.  Cardiac catheterization showed occluded apical LAD supplying the distal inferior wall which was the culprit.  There was also 80% eccentric complex plaque rupture in the proximal LAD which was felt to be responsible for the distal embolization.  RCA stent was patent.  I performed successful PCI and drug-eluting stent placement to the proximal LAD.  I restored flow to the distal LAD just by wiring the occlusion site.  Ejection fraction was normal.  His myocardial infarction was in the setting of COVID-19 infection.  He presented in December 2022 with non-ST elevation myocardial infarction with peak troponin of 3800.  He underwent cardiac catheterization which showed patent stents in the proximal LAD and distal right coronary artery/right PDA.  No culprit was identified with normal ejection fraction and normal filling pressure.  Imdur was added and clopidogrel was resumed.  Clopidogrel was previously discontinued in October due to nosebleed and being almost 2 years out of his previous PCI. Imdur was subsequently discontinued due to headaches.  He is currently taking half a tablet of Plavix every other day.  If he takes it every day, he gets nosebleed.  Reports 1 episode of chest pain about 2 months ago but no recurrent symptoms since then.  He does feel that his shortness of  breath is getting worse and he continues to follow-up with Dr. Raul Del regarding pulmonary fibrosis.  He is supposed to get pulmonary function testing in the near future.   Past Medical History:  Diagnosis Date   Coronary artery disease    Elevated cholesterol    Myocardial infarction [facility])    Pulmonary fibrosis ([facility])    Restless leg syndrome     Past Surgical History:  Procedure Laterality Date   ANGIOPLASTY  2010   2 stents placed   BACK SURGERY     cardiac stents     x2   COLONOSCOPY     COLONOSCOPY WITH PROPOFOL N/A 03/05/2018   Procedure: COLONOSCOPY WITH PROPOFOL;  Surgeon: Dillon Manifold, MD;  Location: [facility] ENDOSCOPY;  Service: Endoscopy;  Laterality: N/A;   COLONOSCOPY WITH PROPOFOL N/A 01/24/2022   Procedure: COLONOSCOPY WITH PROPOFOL;  Surgeon: Dillon Bellows, MD;  Location: [facility] ENDOSCOPY;  Service: Gastroenterology;  Laterality: N/A;   CORONARY ANGIOPLASTY     CORONARY/GRAFT ACUTE MI REVASCULARIZATION N/A 09/30/2019   Procedure: Coronary/Graft Acute MI Revascularization;  Surgeon: Dillon Hampshire, MD;  Location: [place] [facility] LAB;  Service: Cardiovascular;  Laterality: N/A;   LEFT HEART CATH AND CORONARY ANGIOGRAPHY N/A 09/30/2019   Procedure: LEFT HEART CATH AND CORONARY ANGIOGRAPHY;  Surgeon: Dillon Hampshire, MD;  Location: [place] [facility] LAB;  Service: Cardiovascular;  Laterality: N/A;   LEFT HEART CATH AND CORS/GRAFTS ANGIOGRAPHY N/A 10/01/2021   Procedure: LEFT HEART CATH AND CORS/GRAFTS ANGIOGRAPHY;  Surgeon: Dillon Bush, MD;  Location:  [place] [facility] LAB;  Service: Cardiovascular;  Laterality: N/A;     Current Outpatient Medications  Medication Sig Dispense Refill   acetaminophen (TYLENOL) 500 MG tablet Take 1,000 mg by mouth every 8 (eight) hours as needed for moderate pain.     ALPHA LIPOIC ACID PO Take 500-1,000 mg by mouth daily.     aspirin EC 81 MG tablet Take 81 mg by mouth daily. Swallow whole.     atorvastatin (LIPITOR) 40 MG tablet  TAKE 1 TABLET BY MOUTH EVERY DAY 90 tablet 2   carvedilol (COREG) 3.125 MG tablet TAKE 1 TABLET (3.125 MG TOTAL) BY MOUTH 2 (TWO) TIMES DAILY WITH A MEAL. 180 tablet 0   clopidogrel (PLAVIX) 75 MG tablet Take 37.5 mg by mouth every other day.     Fluticasone-Umeclidin-Vilant (TRELEGY ELLIPTA) 100-62.5-25 MCG/INH AEPB Inhale 1 puff into the lungs daily.     nitroGLYCERIN (NITROSTAT) 0.4 MG SL tablet Place 1 tablet (0.4 mg total) under the tongue every 5 (five) minutes x 3 doses as needed for chest pain. 20 tablet 12   Pirfenidone (ESBRIET) 267 MG CAPS Take 3 capsules by mouth 3 (three) times daily.     ezetimibe (ZETIA) 10 MG tablet Take 1 tablet (10 mg total) by mouth daily. (Patient not taking: Reported on 04/09/2022) 30 tablet 2   isosorbide mononitrate (IMDUR) 30 MG 24 hr tablet Take 15 mg by mouth daily. (Patient not taking: Reported on 04/09/2022)     No current facility-administered medications for this visit.    Allergies:   Ace inhibitors    Social History:  The patient  reports that he quit smoking about 13 years ago. His smoking use included cigarettes. He has never used smokeless tobacco. He reports that he does not currently use alcohol. He reports that he does not currently use drugs.   Family History:  The patient's family history includes Heart disease in his father and mother.    ROS:  Please see the history of present illness.   Otherwise, review of systems are positive for none.   All other systems are reviewed and negative.    PHYSICAL EXAM: VS:  BP 120/70 (BP Location: Left Arm, Patient Position: Sitting, Cuff Size: Normal)   Pulse 73   Ht '6\' 2"'$  (1.88 m)   Wt 183 lb 8 oz (83.2 kg)   SpO2 91%   BMI 23.56 kg/m  , BMI Body mass index is 23.56 kg/m. GEN: Well nourished, well developed, in no acute distress  HEENT: normal  Neck: no JVD, carotid bruits, or masses Cardiac: RRR; no murmurs, rubs, or gallops,no edema  Respiratory:  clear to auscultation bilaterally,  normal work of breathing GI: soft, nontender, nondistended, + BS MS: no deformity or atrophy  Skin: warm and dry, no rash Neuro:  Strength and sensation are intact Psych: euthymic mood, full affect   EKG:  EKG is ordered today. The ekg ordered today demonstrates normal sinus rhythm with no significant ST or T wave changes.  Left axis deviation   Recent Labs: 07/01/2021: ALT 21 09/30/2021: TSH 2.430 10/02/2021: BUN 13; Creatinine, Ser 0.64; Hemoglobin 13.6; Platelets 153; Potassium 4.6; Sodium 134    Lipid Panel    Component Value Date/Time   CHOL 118 10/01/2021 0422   TRIG 45 10/01/2021 0422   HDL 45 10/01/2021 0422   CHOLHDL 2.6 10/01/2021 0422   VLDL 9 10/01/2021 0422   LDLCALC 64 10/01/2021 0422   LDLCALC 63 08/31/2020 1433  Wt Readings from Last 3 Encounters:  04/09/22 183 lb 8 oz (83.2 kg)  04/08/22 182 lb 6.4 oz (82.7 kg)  04/07/22 177 lb (80.3 kg)           No data to display            ASSESSMENT AND PLAN:  1.  Coronary artery disease involving native coronary arteries without angina: No frequent chest pain overall and seems to be stable from a cardiac standpoint.  EKG is unremarkable.  Continue aspirin daily.  Continue half dose Plavix every other day for now.  If she develops recurrent cardiovascular events and need full dose dual antiplatelet therapy, will have him see ENT for recurrent nosebleeds.  2.  Hyperlipidemia: I reviewed his recent lipid profile which showed an LDL of 64.  Continue atorvastatin and ezetimibe.  3.  Essential hypertension: Blood pressures controlled on small dose carvedilol.  4.  Interstitial lung disease: Reports worsening exertional dyspnea and is supposed to get pulmonary function testing in the near future. I am going to obtain an echocardiogram to evaluate LV systolic and diastolic function to make sure there is no cardiac reason for his worsening dyspnea.    Disposition:   FU with me in 6  months  Signed,  Dillon Sacramento, MD  04/09/2022 4:08 PM    [facility]

## 2022-04-09 NOTE — Patient Instructions (Signed)
Medication Instructions:  Your physician recommends that you continue on your current medications as directed. Please refer to the Current Medication list given to you today.  *If you need a refill on your cardiac medications before your next appointment, please call your pharmacy*   Lab Work: None ordered If you have labs (blood work) drawn today and your tests are completely normal, you will receive your results only by: [place] (if you have MyChart) OR A paper copy in the mail If you have any lab test that is abnormal or we need to change your treatment, we will call you to review the results.   Testing/Procedures: Your physician has requested that you have an echocardiogram. Echocardiography is a painless test that uses sound waves to create images of your heart. It provides your doctor with information about the size and shape of your heart and how well your heart's chambers and valves are working. This procedure takes approximately one hour. There are no restrictions for this procedure.    Follow-Up: At [facility], you and your health needs are our priority.  As part of our continuing mission to provide you with exceptional heart care, we have created designated Provider Care Teams.  These Care Teams include your primary Cardiologist (physician) and Advanced Practice Providers (APPs -  Physician Assistants and Nurse Practitioners) who all work together to provide you with the care you need, when you need it.  We recommend signing up for the patient portal called "MyChart".  Sign up information is provided on this After Visit Summary.  MyChart is used to connect with patients for Virtual Visits (Telemedicine).  Patients are able to view lab/test results, encounter notes, upcoming appointments, etc.  Non-urgent messages can be sent to your provider as well.   To learn more about what you can do with MyChart, go to [email or web address].    Your next appointment:   6  month(s)  The format for your next appointment:   In Person  Provider:   You may see [name], MD or one of the following Advanced Practice Providers on your designated Care Team:   Murray Hodgkins, NP Christell Faith, PA-C Cadence Kathlen Mody, [place]   Other Instructions N/A  Important Information About [place]

## 2022-04-13 DIAGNOSIS — J449 Chronic obstructive pulmonary disease, unspecified: Secondary | ICD-10-CM | POA: Diagnosis not present

## 2022-04-16 ENCOUNTER — Ambulatory Visit (INDEPENDENT_AMBULATORY_CARE_PROVIDER_SITE_OTHER): Payer: BC Managed Care – PPO

## 2022-04-16 ENCOUNTER — Ambulatory Visit (INDEPENDENT_AMBULATORY_CARE_PROVIDER_SITE_OTHER): Payer: BC Managed Care – PPO | Admitting: Dermatology

## 2022-04-16 DIAGNOSIS — L219 Seborrheic dermatitis, unspecified: Secondary | ICD-10-CM | POA: Diagnosis not present

## 2022-04-16 DIAGNOSIS — Z872 Personal history of diseases of the skin and subcutaneous tissue: Secondary | ICD-10-CM | POA: Diagnosis not present

## 2022-04-16 DIAGNOSIS — Z1283 Encounter for screening for malignant neoplasm of skin: Secondary | ICD-10-CM

## 2022-04-16 DIAGNOSIS — R0602 Shortness of breath: Secondary | ICD-10-CM

## 2022-04-16 DIAGNOSIS — R21 Rash and other nonspecific skin eruption: Secondary | ICD-10-CM

## 2022-04-16 LAB — ECHOCARDIOGRAM COMPLETE
AR max vel: 3.59 cm2
AV Area VTI: 3.82 cm2
AV Area mean vel: 3.59 cm2
AV Mean grad: 4 mmHg
AV Peak grad: 8 mmHg
Ao pk vel: 1.41 m/s
Area-P 1/2: 2.76 cm2
Calc EF: 54.4 %
S' Lateral: 3.1 cm
Single Plane A2C EF: 49.8 %
Single Plane A4C EF: 54.6 %

## 2022-04-16 MED ORDER — TRIAMCINOLONE ACETONIDE 0.1 % EX OINT: 1.0000 | TOPICAL_OINTMENT | Freq: Two times a day (BID) | CUTANEOUS | 0 refills | Status: DC

## 2022-04-16 MED ORDER — KETOCONAZOLE 2 % EX CREA: 1.0000 | TOPICAL_CREAM | Freq: Two times a day (BID) | CUTANEOUS | 2 refills | Status: AC

## 2022-04-16 NOTE — Progress Notes (Signed)
Follow-Up Visit   Subjective  Dillon [name] is a 66 y.o. male who presents for the following: FBSE (The patient presents for Total-Body Skin Exam (TBSE) for skin cancer screening and mole check.  The patient has spots, moles and lesions to be evaluated, some may be new or changing and the patient has concerns that these could be cancer. Patient with hx of AK's. Patient advises he has been itching all over for about a month. He has been taking Benadryl and using OTC HC cream which has helped. ).  Patient uses Dial soap, nobody else in the house is itchy.  Patient was taking Esbriet but he felt like it was making him dizzy so he has been switched to pirfenidone about a year ago. Patient has also been taking alpha lipoic for a while. No other new medications or changes in the last 4 months.  Patient is accompanied by wife who contributes to history.   The following portions of the chart were reviewed this encounter and updated as appropriate:   Tobacco  Allergies  Meds  Problems  Med Hx  Surg Hx  Fam Hx      Review of Systems:  No other skin or systemic complaints except as noted in HPI or Assessment and Plan.  Objective  Well appearing patient in no apparent distress; mood and affect are within normal limits.  A focused examination was performed including face, chest, back, arms, legs, buttocks. Relevant physical exam findings are noted in the Assessment and Plan.  back, chest Scaly erythematous patch at mid low back Faint scaly pink coalescing papules at back, chest  face Pink patches with greasy scale.     Assessment & Plan  Rash back, chest  Ddx eczema vs hypersensitivity vs other  Start TMC 0.1% ointment twice daily to itchy areas at body and neck for up to 2 weeks. Avoid applying to face, groin, and axilla. Use as directed. Long-term use can cause thinning of the skin.  Recommend OTC [place] Bond Rapid Relief Anti-Itch cream (pramoxine + menthol), CeraVe Anti-itch  cream or lotion (pramoxine), Sarna lotion (Original- menthol + camphor or Sensitive- pramoxine) or Eucerin 12 hour Itch Relief lotion (menthol) up to 3 times per day to areas on body that are itchy.  Recommend Zyrtec in place of Benadryl to help with itch given risk of falls with benadryl  If not improving consider biopsy.   Topical steroids (such as triamcinolone, fluocinolone, fluocinonide, mometasone, clobetasol, halobetasol, betamethasone, hydrocortisone) can cause thinning and lightening of the skin if they are used for too long in the same area. Your physician has selected the right strength medicine for your problem and area affected on the body. Please use your medication only as directed by your physician to prevent side effects.    triamcinolone ointment (KENALOG) 0.1 % - back, chest Apply 1 Application topically 2 (two) times daily. To itchy areas at neck and body for up to 2 weeks. Avoid applying to face, groin, and axilla. Use as directed. Long-term use can cause thinning of the skin.  Seborrheic dermatitis face  Chronic and persistent condition with duration or expected duration over one year. Condition is bothersome/symptomatic for patient. Currently flared.  Seborrheic Dermatitis  -  is a chronic persistent rash characterized by pinkness and scaling most commonly of the mid face but also can occur on the scalp (dandruff), ears; mid chest, mid back and groin.  It tends to be exacerbated by stress and cooler weather.  People  who have neurologic disease may experience new onset or exacerbation of existing seborrheic dermatitis.  The condition is not curable but treatable and can be controlled.  Start ketoconazole 2% cream twice daily to affected areas at face.   ketoconazole (NIZORAL) 2 % cream - face Apply 1 Application topically 2 (two) times daily. To affected areas at face.    Return in about 3 weeks (around 05/07/2022) for TBSE, rash follow up.  Graciella Belton, RMA,  am acting as scribe for Forest Gleason, MD .  Documentation: I have reviewed the above documentation for accuracy and completeness, and I agree with the above.  Forest Gleason, MD

## 2022-04-16 NOTE — Patient Instructions (Addendum)
Start triamcinolone 0.1% ointment twice daily to itchy areas at body and neck. Avoid applying to face, groin, and axilla. Use as directed. Long-term use can cause thinning of the skin.  Recommend OTC Gold Bond Rapid Relief Anti-Itch cream (pramoxine + menthol), CeraVe Anti-itch cream or lotion (pramoxine), Sarna lotion (Original- menthol + camphor or Sensitive- pramoxine) or Eucerin 12 hour Itch Relief lotion (menthol) up to 3 times per day to areas on body that are itchy.  Topical steroids (such as triamcinolone, fluocinolone, fluocinonide, mometasone, clobetasol, halobetasol, betamethasone, hydrocortisone) can cause thinning and lightening of the skin if they are used for too long in the same area. Your physician has selected the right strength medicine for your problem and area affected on the body. Please use your medication only as directed by your physician to prevent side effects.    Gentle Skin Care Guide  1. Bathe no more than once a day.  2. Avoid bathing in hot water  3. Use a mild soap like Dove, Vanicream, Cetaphil, CeraVe. Can use Lever 2000 or Cetaphil antibacterial soap  4. Use soap only where you need it. On most days, use it under your arms, between your legs, and on your feet. Let the water rinse other areas unless visibly dirty.  5. When you get out of the bath/shower, use a towel to gently blot your skin dry, don't rub it.  6. While your skin is still a little damp, apply a moisturizing cream such as Vanicream, CeraVe, Cetaphil, Eucerin, Sarna lotion or plain Vaseline Jelly. For hands apply Neutrogena [place] Hand Cream or Excipial Hand Cream.  7. Reapply moisturizer any time you start to itch or feel dry.  8. Sometimes using free and clear laundry detergents can be helpful. Fabric softener sheets should be avoided. Downy Free & Gentle liquid, or any liquid fabric softener that is free of dyes and perfumes, it acceptable to use  9. If your doctor has given you  prescription creams you may apply moisturizers over them   Due to recent changes in healthcare laws, you may see results of your pathology and/or laboratory studies on MyChart before the doctors have had a chance to review them. We understand that in some cases there may be results that are confusing or concerning to you. Please understand that not all results are received at the same time and often the doctors may need to interpret multiple results in order to provide you with the best plan of care or course of treatment. Therefore, we ask that you please give [place] 2 business days to thoroughly review all your results before contacting the office for clarification. Should we see a critical lab result, you will be contacted sooner.   If You Need Anything After Your Visit  If you have any questions or concerns for your doctor, please call our main line at [phone] and press option 4 to reach your doctor's medical assistant. If no one answers, please leave a voicemail as directed and we will return your call as soon as possible. Messages left after 4 pm will be answered the following business day.   You may also send [place] a message via [place]. We typically respond to MyChart messages within 1-2 business days.  For prescription refills, please ask your pharmacy to contact our office. Our fax number is [phone].  If you have an urgent issue when the clinic is closed that cannot wait until the next business day, you can page your doctor at the number below.  Please note that while we do our best to be available for urgent issues outside of office hours, we are not available 24/7.   If you have an urgent issue and are unable to reach [place], you may choose to seek medical care at your doctor's office, retail clinic, urgent care center, or emergency room.  If you have a medical emergency, please immediately call 911 or go to the emergency department.  Pager Numbers  - Dr. Nehemiah Massed:  [phone]  - Dr. Laurence Ferrari: [phone]  - Dr. Nicole Kindred: [phone]  In the event of inclement weather, please call our main line at [phone] for an update on the status of any delays or closures.  Dermatology Medication Tips: Please keep the boxes that topical medications come in in order to help keep track of the instructions about where and how to use these. Pharmacies typically print the medication instructions only on the boxes and not directly on the medication tubes.   If your medication is too expensive, please contact our office at [phone] option 4 or send [place] a message through [place].   We are unable to tell what your co-pay for medications will be in advance as this is different depending on your insurance coverage. However, we may be able to find a substitute medication at lower cost or fill out paperwork to get insurance to cover a needed medication.   If a prior authorization is required to get your medication covered by your insurance company, please allow [place] 1-2 business days to complete this process.  Drug prices often vary depending on where the prescription is filled and some pharmacies may offer cheaper prices.  The website www.goodrx.com contains coupons for medications through different pharmacies. The prices here do not account for what the cost may be with help from insurance (it may be cheaper with your insurance), but the website can give you the price if you did not use any insurance.  - You can print the associated coupon and take it with your prescription to the pharmacy.  - You may also stop by our office during regular business hours and pick up a GoodRx coupon card.  - If you need your prescription sent electronically to a different pharmacy, notify our office through [facility] or by phone at [phone] option 4.     Si Usted Necesita Algo Despus de Su Visita  Tambin puede enviarnos un mensaje a travs de Pharmacist, community. Por lo general  respondemos a los mensajes de MyChart en el transcurso de 1 a 2 das hbiles.  Para renovar recetas, por favor pida a su farmacia que se ponga en contacto con nuestra oficina. Harland Dingwall de fax es [place] [phone].  Si tiene un asunto urgente cuando la clnica est cerrada y que no puede esperar hasta el siguiente da hbil, puede llamar/localizar a su doctor(a) al nmero que aparece a continuacin.   Por favor, tenga en cuenta que aunque hacemos todo lo posible para estar disponibles para asuntos urgentes fuera del horario de [place], no estamos disponibles las 24 horas del da, los 7 das de la [place].   Si tiene un problema urgente y no puede comunicarse con nosotros, puede optar por buscar atencin mdica  en el consultorio de su doctor(a), en una clnica privada, en un centro de atencin urgente o en una sala de emergencias.  Si tiene Engineering geologist, por favor llame inmediatamente al 911 o vaya a la sala de emergencias.  Nmeros de bper  - Dr. Nehemiah Massed:  [phone]  - Dra. Moye: [phone]  - Dra. Nicole Kindred: [phone]  En caso de inclemencias del [place], por favor llame a Johnsie Kindred principal al [phone] para una actualizacin sobre el [place] de cualquier retraso o cierre.  Consejos para la medicacin en dermatologa: Por favor, guarde las cajas en las que vienen los medicamentos de uso tpico para ayudarle a seguir las instrucciones sobre dnde y cmo usarlos. Las farmacias generalmente imprimen las instrucciones del medicamento slo en las cajas y no directamente en los tubos del [place].   Si su medicamento es muy caro, por favor, pngase en contacto con Zigmund Daniel llamando al [phone] y presione la opcin 4 o envenos un mensaje a travs de Pharmacist, community.   No podemos decirle cul ser su copago por los medicamentos por adelantado ya que esto es diferente dependiendo de la cobertura de su seguro. Sin embargo, es posible que podamos encontrar un  medicamento sustituto a Electrical engineer un formulario para que el seguro cubra el medicamento que se considera necesario.   Si se requiere una autorizacin previa para que su compaa de seguros [place] su medicamento, por favor permtanos de 1 a 2 das hbiles para completar este proceso.  Los precios de los medicamentos varan con frecuencia dependiendo del Environmental consultant de dnde se surte la receta y alguna farmacias pueden ofrecer precios ms baratos.  El sitio web www.goodrx.com tiene cupones para medicamentos de Airline pilot. Los precios aqu no tienen en cuenta lo que podra costar con la ayuda del seguro (puede ser ms barato con su seguro), pero el sitio web puede darle el precio si no utiliz Research scientist (physical sciences).  - Puede imprimir el cupn correspondiente y llevarlo con su receta a la farmacia.  - Tambin puede pasar por nuestra oficina durante el horario de atencin regular y Charity fundraiser una tarjeta de cupones de GoodRx.  - Si necesita que su receta se enve electrnicamente a una farmacia diferente, informe a nuestra oficina a travs de MyChart de [place] o por telfono llamando al [phone] y presione la opcin 4.

## 2022-04-21 ENCOUNTER — Telehealth: Payer: Self-pay

## 2022-04-21 NOTE — Telephone Encounter (Signed)
-----   Message from Wellington Hampshire, MD sent at 04/18/2022  8:09 AM EDT ----- Inform patient that echo was fine.  Normal ejection fraction with no evidence of pulmonary hypertension or valvular abnormalities.

## 2022-04-21 NOTE — Telephone Encounter (Signed)
Called pt to review echo results. Unable to lmom. Results released to mychart.

## 2022-04-22 ENCOUNTER — Encounter: Payer: Self-pay | Admitting: Dermatology

## 2022-04-28 NOTE — Telephone Encounter (Signed)
Results not reviewed on MyChart.   Attempted to call the patient. No answer- I left a detailed message of results on his voice mail (ok per DPR). I asked that he call back with any further questions or concerns.

## 2022-05-04 ENCOUNTER — Other Ambulatory Visit: Payer: Self-pay | Admitting: Cardiovascular Disease

## 2022-05-04 DIAGNOSIS — I2510 Atherosclerotic heart disease of native coronary artery without angina pectoris: Secondary | ICD-10-CM

## 2022-05-06 ENCOUNTER — Encounter: Payer: Self-pay | Admitting: Internal Medicine

## 2022-05-06 ENCOUNTER — Ambulatory Visit (INDEPENDENT_AMBULATORY_CARE_PROVIDER_SITE_OTHER): Payer: BC Managed Care – PPO | Admitting: Internal Medicine

## 2022-05-06 VITALS — BP 117/68 | HR 72 | Ht 74.0 in | Wt 182.1 lb

## 2022-05-06 DIAGNOSIS — I2510 Atherosclerotic heart disease of native coronary artery without angina pectoris: Secondary | ICD-10-CM

## 2022-05-06 DIAGNOSIS — M67912 Unspecified disorder of synovium and tendon, left shoulder: Secondary | ICD-10-CM | POA: Diagnosis not present

## 2022-05-06 DIAGNOSIS — J449 Chronic obstructive pulmonary disease, unspecified: Secondary | ICD-10-CM

## 2022-05-06 DIAGNOSIS — I10 Essential (primary) hypertension: Secondary | ICD-10-CM

## 2022-05-06 DIAGNOSIS — I2583 Coronary atherosclerosis due to lipid rich plaque: Secondary | ICD-10-CM

## 2022-05-06 NOTE — Assessment & Plan Note (Signed)
Patient is on home oxygen therapy 2 L/min

## 2022-05-06 NOTE — Assessment & Plan Note (Signed)
Patient denies any chest pain.  Heart is regular chest is clear

## 2022-05-06 NOTE — Assessment & Plan Note (Signed)
Stable at the present time without any pain

## 2022-05-06 NOTE — Progress Notes (Signed)
New Patient Office Visit  Subjective:  Patient ID: Dillon [name], male    DOB: [birth year]  Age: 66 y.o. MRN: [id number]  CC:  Chief Complaint  Patient presents with   Follow-up    HPI    Past Medical History:  Diagnosis Date   Coronary artery disease    Elevated cholesterol    Myocardial infarction ([place])    Pulmonary fibrosis ([facility])    Restless leg syndrome      Current Outpatient Medications:    acetaminophen (TYLENOL) 500 MG tablet, Take 1,000 mg by mouth every 8 (eight) hours as needed for moderate pain., Disp: , Rfl:    ALPHA LIPOIC ACID PO, Take 500-1,000 mg by mouth daily., Disp: , Rfl:    aspirin EC 81 MG tablet, Take 81 mg by mouth daily. Swallow whole., Disp: , Rfl:    atorvastatin (LIPITOR) 40 MG tablet, TAKE 1 TABLET BY MOUTH EVERY DAY, Disp: 90 tablet, Rfl: 2   carvedilol (COREG) 3.125 MG tablet, TAKE 1 TABLET BY MOUTH TWICE A DAY WITH A MEAL, Disp: 180 tablet, Rfl: 2   clopidogrel (PLAVIX) 75 MG tablet, Take 37.5 mg by mouth every other day., Disp: , Rfl:    Fluticasone-Umeclidin-Vilant (TRELEGY ELLIPTA) 100-62.5-25 MCG/INH AEPB, Inhale 1 puff into the lungs daily., Disp: , Rfl:    ketoconazole (NIZORAL) 2 % cream, Apply 1 Application topically 2 (two) times daily. To affected areas at face., Disp: 30 g, Rfl: 2   nitroGLYCERIN (NITROSTAT) 0.4 MG SL tablet, Place 1 tablet (0.4 mg total) under the tongue every 5 (five) minutes x 3 doses as needed for chest pain., Disp: 20 tablet, Rfl: 12   Pirfenidone (ESBRIET) 267 MG CAPS, Take 3 capsules by mouth 3 (three) times daily., Disp: , Rfl:    triamcinolone ointment (KENALOG) 0.1 %, Apply 1 Application topically 2 (two) times daily. To itchy areas at neck and body for up to 2 weeks. Avoid applying to face, groin, and axilla. Use as directed. Long-term use can cause thinning of the skin., Disp: 371 g, Rfl: 0   Past Surgical History:  Procedure Laterality Date   ANGIOPLASTY  2010   2 stents placed   BACK SURGERY      cardiac stents     x2   COLONOSCOPY     COLONOSCOPY WITH PROPOFOL N/A 03/05/2018   Procedure: COLONOSCOPY WITH PROPOFOL;  Surgeon: Virgel Manifold, MD;  Location: [facility] ENDOSCOPY;  Service: Endoscopy;  Laterality: N/A;   COLONOSCOPY WITH PROPOFOL N/A 01/24/2022   Procedure: COLONOSCOPY WITH PROPOFOL;  Surgeon: Jonathon Bellows, MD;  Location: [facility] ENDOSCOPY;  Service: Gastroenterology;  Laterality: N/A;   CORONARY ANGIOPLASTY     CORONARY/GRAFT ACUTE MI REVASCULARIZATION N/A 09/30/2019   Procedure: Coronary/Graft Acute MI Revascularization;  Surgeon: Wellington Hampshire, MD;  Location: [place] CV LAB;  Service: Cardiovascular;  Laterality: N/A;   LEFT HEART CATH AND CORONARY ANGIOGRAPHY N/A 09/30/2019   Procedure: LEFT HEART CATH AND CORONARY ANGIOGRAPHY;  Surgeon: Wellington Hampshire, MD;  Location: [place] [facility];  Service: Cardiovascular;  Laterality: N/A;   LEFT HEART CATH AND CORS/GRAFTS ANGIOGRAPHY N/A 10/01/2021   Procedure: LEFT HEART CATH AND CORS/GRAFTS ANGIOGRAPHY;  Surgeon: Nelva Bush, MD;  Location: [place] [facility] LAB;  Service: Cardiovascular;  Laterality: N/A;    Family History  Problem Relation Age of Onset   Heart disease Mother    Heart disease Father     Social History   Socioeconomic History   Marital  status: Married    Spouse name: Not on file   Number of children: Not on file   Years of education: Not on file   Highest education level: Not on file  Occupational History   Not on file  Tobacco Use   Smoking status: Former    Types: Cigarettes    Quit date: 03/05/2009    Years since quitting: 13.1   Smokeless tobacco: Never  Vaping Use   Vaping Use: Never used  Substance and Sexual Activity   Alcohol use: Not Currently    Comment: occassionally - 1 beer/month or less   Drug use: Not Currently   Sexual activity: Not Currently  Other Topics Concern   Not on file  Social History Narrative   Lives locally with wife.  Works as Dealer for  [employer].  Does not routinely exercise.   Social Determinants of Health   Financial Resource Strain: Not on file  Food Insecurity: Not on file  Transportation Needs: Not on file  Physical Activity: Not on file  Stress: Not on file  Social Connections: Not on file  Intimate Partner Violence: Not on file    ROS Review of Systems  Constitutional: Negative.   HENT: Negative.    Eyes: Negative.   Respiratory: Negative.    Cardiovascular: Negative.   Gastrointestinal: Negative.   Endocrine: Negative.   Genitourinary: Negative.   Musculoskeletal: Negative.   Skin: Negative.   Allergic/Immunologic: Negative.   Neurological: Negative.   Hematological: Negative.   Psychiatric/Behavioral: Negative.    All other systems reviewed and are negative.   Objective:   Today's Vitals: BP 117/68   Pulse 72   Ht '6\' 2"'$  (1.88 m)   Wt 182 lb 1.6 oz (82.6 kg)   SpO2 90% Comment: 2 liters  BMI 23.38 kg/m   Physical Exam Constitutional:      Appearance: He is ill-appearing.  HENT:     Head: Normocephalic.     Mouth/Throat:     Mouth: Mucous membranes are moist.  Eyes:     Pupils: Pupils are equal, round, and reactive to light.  Cardiovascular:     Rate and Rhythm: Regular rhythm.     Pulses: Normal pulses.  Pulmonary:     Effort: Pulmonary effort is normal.  Abdominal:     Palpations: Abdomen is soft.     Tenderness: There is no abdominal tenderness.  Musculoskeletal:        General: No swelling.     Cervical back: Neck supple.  Skin:    General: Skin is dry.  Neurological:     General: No focal deficit present.     Mental Status: He is alert.  Psychiatric:        Mood and Affect: Mood normal.     Assessment & Plan:   Problem List Items Addressed This Visit       Cardiovascular and Mediastinum   Coronary artery disease due to lipid rich plaque - Primary    Patient denies any chest pain.  Heart is regular chest is clear      Essential hypertension       3. Importance of regular aerobic exercise and losing weight. 4. Reduce dietary saturated fat and cholesterol intake for overall cardiovascular health. 5. Maintaining adequate dietary potassium, calcium, and magnesium intake. 6. Regular monitoring of the blood pressure. 7. Reduce sodium intake to less than 100 mmol/day (less than 2.3 gm of sodium or less than 6 gm of sodium choride)  Respiratory   Chronic obstructive pulmonary disease ([place])    Patient is on home oxygen therapy 2 L/min        Musculoskeletal and Integument   Disorder of left rotator cuff    Stable at the present time without any pain       Outpatient Encounter Medications as of 05/06/2022  Medication Sig   acetaminophen (TYLENOL) 500 MG tablet Take 1,000 mg by mouth every 8 (eight) hours as needed for moderate pain.   ALPHA LIPOIC ACID PO Take 500-1,000 mg by mouth daily.   aspirin EC 81 MG tablet Take 81 mg by mouth daily. Swallow whole.   atorvastatin (LIPITOR) 40 MG tablet TAKE 1 TABLET BY MOUTH EVERY DAY   carvedilol (COREG) 3.125 MG tablet TAKE 1 TABLET BY MOUTH TWICE A DAY WITH A MEAL   clopidogrel (PLAVIX) 75 MG tablet Take 37.5 mg by mouth every other day.   Fluticasone-Umeclidin-Vilant (TRELEGY ELLIPTA) 100-62.5-25 MCG/INH AEPB Inhale 1 puff into the lungs daily.   ketoconazole (NIZORAL) 2 % cream Apply 1 Application topically 2 (two) times daily. To affected areas at face.   nitroGLYCERIN (NITROSTAT) 0.4 MG SL tablet Place 1 tablet (0.4 mg total) under the tongue every 5 (five) minutes x 3 doses as needed for chest pain.   Pirfenidone (ESBRIET) 267 MG CAPS Take 3 capsules by mouth 3 (three) times daily.   triamcinolone ointment (KENALOG) 0.1 % Apply 1 Application topically 2 (two) times daily. To itchy areas at neck and body for up to 2 weeks. Avoid applying to face, groin, and axilla. Use as directed. Long-term use can cause thinning of the skin.   [DISCONTINUED] ezetimibe (ZETIA) 10 MG tablet Take 1  tablet (10 mg total) by mouth daily. (Patient not taking: Reported on 04/09/2022)   [DISCONTINUED] isosorbide mononitrate (IMDUR) 30 MG 24 hr tablet Take 15 mg by mouth daily. (Patient not taking: Reported on 04/09/2022)   No facility-administered encounter medications on file as of 05/06/2022.    Follow-up: No follow-ups on file.   [name], MD

## 2022-05-06 NOTE — Assessment & Plan Note (Addendum)
  3. Importance of regular aerobic exercise and losing weight. 4. Reduce dietary saturated fat and cholesterol intake for overall cardiovascular health. 5. Maintaining adequate dietary potassium, calcium, and magnesium intake. 6. Regular monitoring of the blood pressure. 7. Reduce sodium intake to less than 100 mmol/day (less than 2.3 gm of sodium or less than 6 gm of sodium choride)

## 2022-05-07 DIAGNOSIS — Z9981 Dependence on supplemental oxygen: Secondary | ICD-10-CM | POA: Diagnosis not present

## 2022-05-07 DIAGNOSIS — J84112 Idiopathic pulmonary fibrosis: Secondary | ICD-10-CM | POA: Diagnosis not present

## 2022-05-07 DIAGNOSIS — J439 Emphysema, unspecified: Secondary | ICD-10-CM | POA: Diagnosis not present

## 2022-05-07 DIAGNOSIS — R0609 Other forms of dyspnea: Secondary | ICD-10-CM | POA: Diagnosis not present

## 2022-05-08 ENCOUNTER — Ambulatory Visit: Payer: BC Managed Care – PPO | Admitting: Gastroenterology

## 2022-05-13 DIAGNOSIS — J449 Chronic obstructive pulmonary disease, unspecified: Secondary | ICD-10-CM | POA: Diagnosis not present

## 2022-05-13 DIAGNOSIS — G4733 Obstructive sleep apnea (adult) (pediatric): Secondary | ICD-10-CM | POA: Diagnosis not present

## 2022-05-15 ENCOUNTER — Other Ambulatory Visit: Payer: Self-pay

## 2022-05-15 ENCOUNTER — Ambulatory Visit (INDEPENDENT_AMBULATORY_CARE_PROVIDER_SITE_OTHER): Payer: BC Managed Care – PPO | Admitting: Gastroenterology

## 2022-05-15 ENCOUNTER — Encounter: Payer: Self-pay | Admitting: Gastroenterology

## 2022-05-15 VITALS — BP 144/81 | HR 75 | Temp 97.9°F | Wt 183.2 lb

## 2022-05-15 DIAGNOSIS — K648 Other hemorrhoids: Secondary | ICD-10-CM | POA: Diagnosis not present

## 2022-05-15 NOTE — Progress Notes (Signed)
Patient follow-ups today for banding of hemorrhoids    Summary of history : Summary of history :     01/24/2022 underwent colonoscopy: 3 sessile polyps seen that was subcentimeter.  Resected.  Internal hemorrhoids noted that were large.  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  Repeat colonoscopy in 3 years. Symptomatic hemorrhoids non responsive to conservative management.    First round:02/20/2022: RA column banded  Second round: 04/07/2022 left lateral column banded   Interval history   04/07/2022-05/15/2022  Doing well since his last visit.  Less episodes of bleeding.  Digital rectal exam performed in the presence of a chaperone. External anal findings: None Internal findings: None, No masses, no blood on glove noticed.    PROCEDURE NOTE: The patient presents with symptomatic grade 1 hemorrhoids, unresponsive to maximal medical therapy, requesting rubber band ligation of his/her hemorrhoidal disease.  All risks, benefits and alternative forms of therapy were described and informed consent was obtained.  In the Left Lateral Decubitus position (if anoscopy is performed) anoscopic examination revealed grade 1 hemorrhoids in the RP position(s).   The decision was made to band the RP internal hemorrhoid, and the Bartlett was used to perform band ligation without complication.  Digital anorectal examination was then performed to assure proper positioning of the band, and to adjust the banded tissue as required.  The patient was discharged home without pain or other issues.  Dietary and behavioral recommendations were given and (if necessary - prescriptions were given), along with follow-up instructions.  The patient will return as needed for follow-up and possible additional banding as required.  No complications were encountered and the patient tolerated the procedure well.   Plan:  Avoid constipation.   Follow-up: As needed  Dr Jonathon Bellows MD,MRCP  [facility]) Gastroenterology/Hepatology Pager: [phone]

## 2022-05-22 ENCOUNTER — Ambulatory Visit (INDEPENDENT_AMBULATORY_CARE_PROVIDER_SITE_OTHER): Payer: BC Managed Care – PPO | Admitting: Dermatology

## 2022-05-22 ENCOUNTER — Encounter: Payer: Self-pay | Admitting: Dermatology

## 2022-05-22 DIAGNOSIS — L219 Seborrheic dermatitis, unspecified: Secondary | ICD-10-CM

## 2022-05-22 DIAGNOSIS — Z1283 Encounter for screening for malignant neoplasm of skin: Secondary | ICD-10-CM | POA: Diagnosis not present

## 2022-05-22 DIAGNOSIS — L814 Other melanin hyperpigmentation: Secondary | ICD-10-CM | POA: Diagnosis not present

## 2022-05-22 DIAGNOSIS — R21 Rash and other nonspecific skin eruption: Secondary | ICD-10-CM

## 2022-05-22 DIAGNOSIS — L603 Nail dystrophy: Secondary | ICD-10-CM | POA: Diagnosis not present

## 2022-05-22 DIAGNOSIS — L570 Actinic keratosis: Secondary | ICD-10-CM | POA: Diagnosis not present

## 2022-05-22 DIAGNOSIS — L821 Other seborrheic keratosis: Secondary | ICD-10-CM

## 2022-05-22 DIAGNOSIS — D1800 Hemangioma unspecified site: Secondary | ICD-10-CM

## 2022-05-22 DIAGNOSIS — D229 Melanocytic nevi, unspecified: Secondary | ICD-10-CM

## 2022-05-22 DIAGNOSIS — L578 Other skin changes due to chronic exposure to nonionizing radiation: Secondary | ICD-10-CM

## 2022-05-22 NOTE — Patient Instructions (Addendum)
Cryotherapy Aftercare  Wash gently with soap and water everyday.   Apply Vaseline Jelly daily until healed.    Face: Use Ketoconazole 2% cream twice daily as needed for flares.   Chest: Resume Triamcinolone cream as needed for flares. Avoid applying to face, groin, and axilla. Use as directed. Long-term use can cause thinning of the skin.   Use Ketoconazole cream here twice daily until clear.  Topical steroids (such as triamcinolone, fluocinolone, fluocinonide, mometasone, clobetasol, halobetasol, betamethasone, hydrocortisone) can cause thinning and lightening of the skin if they are used for too long in the same area. Your physician has selected the right strength medicine for your problem and area affected on the body. Please use your medication only as directed by your physician to prevent side effects.     Recommend daily broad spectrum sunscreen SPF 30+ to sun-exposed areas, reapply every 2 hours as needed. Call for new or changing lesions.  Staying in the shade or wearing long sleeves, sun glasses (UVA+UVB protection) and wide brim hats (4-inch brim around the entire circumference of the hat) are also recommended for sun protection.     Recommend taking Heliocare sun protection supplement daily in sunny weather for additional sun protection. For maximum protection on the sunniest days, you can take up to 2 capsules of regular Heliocare OR take 1 capsule of Heliocare Ultra. For prolonged exposure (such as a full day in the sun), you can repeat your dose of the supplement 4 hours after your first dose. Heliocare can be purchased at [employer], at some [facility] or at [email or web address].     Melanoma ABCDEs  Melanoma is the most dangerous type of skin cancer, and is the leading cause of death from skin disease.  You are more likely to develop melanoma if you: Have light-colored skin, light-colored eyes, or red or blond hair Spend a lot of time in the sun Tan regularly,  either outdoors or in a tanning bed Have had blistering sunburns, especially during childhood Have a close family member who has had a melanoma Have atypical moles or large birthmarks  Early detection of melanoma is key since treatment is typically straightforward and cure rates are extremely high if we catch it early.   The first sign of melanoma is often a change in a mole or a new dark spot.  The ABCDE system is a way of remembering the signs of melanoma.  A for asymmetry:  The two halves do not match. B for border:  The edges of the growth are irregular. C for color:  A mixture of colors are present instead of an even brown color. D for diameter:  Melanomas are usually (but not always) greater than 41m - the size of a pencil eraser. E for evolution:  The spot keeps changing in size, shape, and color.  Please check your skin once per month between visits. You can use a small mirror in front and a large mirror behind you to keep an eye on the back side or your body.   If you see any new or changing lesions before your next follow-up, please call to schedule a visit.  Please continue daily skin protection including broad spectrum sunscreen SPF 30+ to sun-exposed areas, reapplying every 2 hours as needed when you're outdoors.   Staying in the shade or wearing long sleeves, sun glasses (UVA+UVB protection) and wide brim hats (4-inch brim around the entire circumference of the hat) are also recommended for sun protection.  Due to recent changes in healthcare laws, you may see results of your pathology and/or laboratory studies on MyChart before the doctors have had a chance to review them. We understand that in some cases there may be results that are confusing or concerning to you. Please understand that not all results are received at the same time and often the doctors may need to interpret multiple results in order to provide you with the best plan of care or course of treatment.  Therefore, we ask that you please give [place] 2 business days to thoroughly review all your results before contacting the office for clarification. Should we see a critical lab result, you will be contacted sooner.   If You Need Anything After Your Visit  If you have any questions or concerns for your doctor, please call our main line at [phone] and press option 4 to reach your doctor's medical assistant. If no one answers, please leave a voicemail as directed and we will return your call as soon as possible. Messages left after 4 pm will be answered the following business day.   You may also send [place] a message via [place]. We typically respond to MyChart messages within 1-2 business days.  For prescription refills, please ask your pharmacy to contact our office. Our fax number is [phone].  If you have an urgent issue when the clinic is closed that cannot wait until the next business day, you can page your doctor at the number below.    Please note that while we do our best to be available for urgent issues outside of office hours, we are not available 24/7.   If you have an urgent issue and are unable to reach [place], you may choose to seek medical care at your doctor's office, retail clinic, urgent care center, or emergency room.  If you have a medical emergency, please immediately call 911 or go to the emergency department.  Pager Numbers  - Dr. Nehemiah Massed: [phone]  - Dr. Laurence Ferrari: [phone]  - Dr. Nicole Kindred: [phone]  In the event of inclement weather, please call our main line at [phone] for an update on the status of any delays or closures.  Dermatology Medication Tips: Please keep the boxes that topical medications come in in order to help keep track of the instructions about where and how to use these. Pharmacies typically print the medication instructions only on the boxes and not directly on the medication tubes.   If your medication is too expensive, please  contact our office at [phone] option 4 or send [place] a message through [place].   We are unable to tell what your co-pay for medications will be in advance as this is different depending on your insurance coverage. However, we may be able to find a substitute medication at lower cost or fill out paperwork to get insurance to cover a needed medication.   If a prior authorization is required to get your medication covered by your insurance company, please allow [place] 1-2 business days to complete this process.  Drug prices often vary depending on where the prescription is filled and some pharmacies may offer cheaper prices.  The website www.goodrx.com contains coupons for medications through different pharmacies. The prices here do not account for what the cost may be with help from insurance (it may be cheaper with your insurance), but the website can give you the price if you did not use any insurance.  - You can print the associated coupon and take it  with your prescription to the pharmacy.  - You may also stop by our office during regular business hours and pick up a GoodRx coupon card.  - If you need your prescription sent electronically to a different pharmacy, notify our office through [facility] or by phone at [phone] option 4.     Si Usted Necesita Algo Despus de Su Visita  Tambin puede enviarnos un mensaje a travs de Pharmacist, community. Por lo general respondemos a los mensajes de MyChart en el transcurso de 1 a 2 das hbiles.  Para renovar recetas, por favor pida a su farmacia que se ponga en contacto con nuestra oficina. Harland Dingwall de fax es [place] [phone].  Si tiene un asunto urgente cuando la clnica est cerrada y que no puede esperar hasta el siguiente da hbil, puede llamar/localizar a su doctor(a) al nmero que aparece a continuacin.   Por favor, tenga en cuenta que aunque hacemos todo lo posible para estar disponibles para asuntos urgentes fuera del horario de  [place], no estamos disponibles las 24 horas del da, los 7 das de la [place].   Si tiene un problema urgente y no puede comunicarse con nosotros, puede optar por buscar atencin mdica  en el consultorio de su doctor(a), en una clnica privada, en un centro de atencin urgente o en una sala de emergencias.  Si tiene Engineering geologist, por favor llame inmediatamente al 911 o vaya a la sala de emergencias.  Nmeros de bper  - Dr. Nehemiah Massed: [phone]  - Dra. Moye: [phone]  - Dra. Nicole Kindred: [phone]  En caso de inclemencias del [place], por favor llame a Johnsie Kindred principal al [phone] para una actualizacin sobre el [place] de cualquier retraso o cierre.  Consejos para la medicacin en dermatologa: Por favor, guarde las cajas en las que vienen los medicamentos de uso tpico para ayudarle a seguir las instrucciones sobre dnde y cmo usarlos. Las farmacias generalmente imprimen las instrucciones del medicamento slo en las cajas y no directamente en los tubos del [place].   Si su medicamento es muy caro, por favor, pngase en contacto con Zigmund Daniel llamando al [phone] y presione la opcin 4 o envenos un mensaje a travs de Pharmacist, community.   No podemos decirle cul ser su copago por los medicamentos por adelantado ya que esto es diferente dependiendo de la cobertura de su seguro. Sin embargo, es posible que podamos encontrar un medicamento sustituto a Electrical engineer un formulario para que el seguro cubra el medicamento que se considera necesario.   Si se requiere una autorizacin previa para que su compaa de seguros [place] su medicamento, por favor permtanos de 1 a 2 das hbiles para completar este proceso.  Los precios de los medicamentos varan con frecuencia dependiendo del Environmental consultant de dnde se surte la receta y alguna farmacias pueden ofrecer precios ms baratos.  El sitio web www.goodrx.com tiene cupones para medicamentos de Airline pilot. Los  precios aqu no tienen en cuenta lo que podra costar con la ayuda del seguro (puede ser ms barato con su seguro), pero el sitio web puede darle el precio si no utiliz Research scientist (physical sciences).  - Puede imprimir el cupn correspondiente y llevarlo con su receta a la farmacia.  - Tambin puede pasar por nuestra oficina durante el horario de atencin regular y Charity fundraiser una tarjeta de cupones de GoodRx.  - Si necesita que su receta se enve electrnicamente a Chiropodist, informe a nuestra oficina a travs de MyChart de [facility] o por telfono llamando al [phone] y presione la opcin 4.

## 2022-05-22 NOTE — Progress Notes (Signed)
Follow-Up Visit   Subjective  Dillon [name] is a 66 y.o. male who presents for the following: Rash (Back and face. 5 week recheck. Using Triamcinolone cream on back and Ketoconazole cream on face. Rash is better. Patient uses medications as needed. Not itching anymore) and Annual Exam (Here for skin cancer screening. Full body. No personal hx of skin cancer or dysplastic nevi. Has areas of concern today).  The patient presents for Total-Body Skin Exam (TBSE) for skin cancer screening and mole check.  The patient has spots, moles and lesions to be evaluated, some may be new or changing and the patient has concerns that these could be cancer.  Wife with patient.  The following portions of the chart were reviewed this encounter and updated as appropriate:  Tobacco  Allergies  Meds  Problems  Med Hx  Surg Hx  Fam Hx      Review of Systems: No other skin or systemic complaints except as noted in HPI or Assessment and Plan.   Objective  Well appearing patient in no apparent distress; mood and affect are within normal limits.  A full examination was performed including scalp, head, eyes, ears, nose, lips, neck, chest, axillae, abdomen, back, buttocks, bilateral upper extremities, bilateral lower extremities, hands, feet, fingers, toes, fingernails, and toenails. All findings within normal limits unless otherwise noted below.  face, chest Erythematous patches and greasy scale at chest  Right 2nd Toe Nail Plate Thickened and yellow  Nasal dorsum x1 Erythematous thin papules/macules with gritty scale.    Assessment & Plan   Lentigines - Scattered tan macules - Due to sun exposure - Benign-appearing, observe - Recommend daily broad spectrum sunscreen SPF 30+ to sun-exposed areas, reapply every 2 hours as needed. - Call for any changes  Seborrheic Keratoses - Stuck-on, waxy, tan-brown papules and/or plaques  - Benign-appearing - Discussed benign etiology and prognosis. -  Observe - Call for any changes  Melanocytic Nevi - Tan-brown and/or pink-flesh-colored symmetric macules and papules - Benign appearing on exam today - Observation - Call clinic for new or changing moles - Recommend daily use of broad spectrum spf 30+ sunscreen to sun-exposed areas.   Hemangiomas - Red papules - Discussed benign nature - Observe - Call for any changes  Actinic Damage - Chronic condition, secondary to cumulative UV/sun exposure - diffuse scaly erythematous macules with underlying dyspigmentation - Recommend daily broad spectrum sunscreen SPF 30+ to sun-exposed areas, reapply every 2 hours as needed.  - Staying in the shade or wearing long sleeves, sun glasses (UVA+UVB protection) and wide brim hats (4-inch brim around the entire circumference of the hat) are also recommended for sun protection.  - Call for new or changing lesions.  Skin cancer screening performed today.  Seborrheic dermatitis face, chest  Chronic and persistent condition with duration or expected duration over one year. Condition is symptomatic / bothersome to patient. Doing well at face, not currently at goal at chest.   Use Ketoconazole 2% cream twice daily as needed for flares at face and chest.  Use triamcinolone 0.1%  cream twice a day as needed up to 2 weeks to flares at chest. Avoid applying to face, groin, and axilla. Use as directed. Long-term use can cause thinning of the skin.  Topical steroids (such as triamcinolone, fluocinolone, fluocinonide, mometasone, clobetasol, halobetasol, betamethasone, hydrocortisone) can cause thinning and lightening of the skin if they are used for too long in the same area. Your physician has selected the right strength medicine  for your problem and area affected on the body. Please use your medication only as directed by your physician to prevent side effects.       Related Medications ketoconazole (NIZORAL) 2 % cream Apply 1 Application topically 2  (two) times daily. To affected areas at face.  Onychodystrophy Right 2nd Toe Nail Plate  Recommend clipping to assess for fungal infection. Discussed topical and oral treatment options for nail fungus treatment if clipping shows fungus. Discussed trauma can also cause this.   Specimen 1 - Surgical pathology Differential Diagnosis: R/O onychomycosis  Check Margins: No  AK (actinic keratosis) Nasal dorsum x1  Actinic keratoses are precancerous spots that appear secondary to cumulative UV radiation exposure/sun exposure over time. They are chronic with expected duration over 1 year. A portion of actinic keratoses will progress to squamous cell carcinoma of the skin. It is not possible to reliably predict which spots will progress to skin cancer and so treatment is recommended to prevent development of skin cancer.  Recommend daily broad spectrum sunscreen SPF 30+ to sun-exposed areas, reapply every 2 hours as needed.  Recommend staying in the shade or wearing long sleeves, sun glasses (UVA+UVB protection) and wide brim hats (4-inch brim around the entire circumference of the hat). Call for new or changing lesions.  Destruction of lesion - Nasal dorsum x1  Destruction method: cryotherapy   Informed consent: discussed and consent obtained   Lesion destroyed using liquid nitrogen: Yes   Outcome: patient tolerated procedure well with no complications   Post-procedure details: wound care instructions given   Additional details:  Prior to procedure, discussed risks of blister formation, small wound, skin dyspigmentation, or rare scar following cryotherapy. Recommend Vaseline ointment to treated areas while healing.    Return in about 1 year (around 05/23/2023) for TBSE.  I, [name], CMA, am acting as scribe for Forest Gleason, MD.  Documentation: I have reviewed the above documentation for accuracy and completeness, and I agree with the above.  Forest Gleason, MD

## 2022-05-27 ENCOUNTER — Encounter: Payer: Self-pay | Admitting: Cardiology

## 2022-05-27 ENCOUNTER — Telehealth: Payer: Self-pay | Admitting: Cardiovascular Disease

## 2022-05-27 ENCOUNTER — Other Ambulatory Visit: Payer: Self-pay

## 2022-05-27 NOTE — Telephone Encounter (Signed)
Error

## 2022-05-27 NOTE — Telephone Encounter (Signed)
Pt's wife is calling in regards to nosebleed the patient continues to have off and on. She states that Dr. Fletcher Anon discussed possible cauterization and would like a call back to discuss having this done.

## 2022-05-27 NOTE — Telephone Encounter (Signed)
Could make one of the ingredients in Trelegy less effective but this would not likely be the cause of nosebleeds

## 2022-05-27 NOTE — Telephone Encounter (Signed)
DPR on file. Spoke with the patients wife. Pt is taking asa 81 mg daily and clopidogrel 37.5 mg every other day.  Patient had complained of nose bleeds in the past when taking DAPT with asa and plavix. Plavix ws reduced to the current dosage.  Patient still complains of intermittent nose bleeds. He had 2 lasting more than 20 minutes over the weekend. Pt wife sts that they did hold pressure for 15 min. Recommended affrin nose spray prn. Adv the pt wife that for persistent nose bleed, pt should be evaluated at an urgent care for cauterization if needed.  Pt wife sts that they were told that there could be an interaction between coreg and trelegy that could be contributing to the pt nose bleeds.  Adv the pt that I will fwd the msg to Dr. Fletcher Anon and our PharmDs for further recommendation.

## 2022-05-28 ENCOUNTER — Telehealth: Payer: Self-pay

## 2022-05-28 DIAGNOSIS — R040 Epistaxis: Secondary | ICD-10-CM | POA: Diagnosis not present

## 2022-05-28 NOTE — Telephone Encounter (Addendum)
  Tried calling patient regarding results. No answer. LMOM for patient to return call.   ----- Message from [place], MD sent at 05/27/2022  9:02 AM EDT ----- Toenail, right 2nd toe nail plate NAIL MATERIAL, NEGATIVE FOR FUNGUS --> suggest either repeat clipping to send for culture (another type of test) once nail has grown out some again (to check again for fungus) or evaluation by podiatry. Nail changes like this can be seen with trauma and other conditions besides fungal infection.   MAs please call. Thank you!

## 2022-05-28 NOTE — Telephone Encounter (Signed)
DPR on file.  Lmom for the pt with the PharmDs response. Pt wife is to call back if any questions.

## 2022-05-29 ENCOUNTER — Telehealth: Payer: Self-pay

## 2022-05-29 NOTE — Telephone Encounter (Signed)
Patient returned our call and advised it was ok to give results to his wife. I called and spoke with patient's wife and advised her that results were negative for fungus. Told her we could repeat clipping once nail has grown out or recommend he see podiatry.  Lurlean Horns., RMA

## 2022-05-29 NOTE — Telephone Encounter (Signed)
-----   Message from [place], MD sent at 05/27/2022  9:02 AM EDT ----- Toenail, right 2nd toe nail plate NAIL MATERIAL, NEGATIVE FOR FUNGUS --> suggest either repeat clipping to send for culture (another type of test) once nail has grown out some again (to check again for fungus) or evaluation by podiatry. Nail changes like this can be seen with trauma and other conditions besides fungal infection.   MAs please call. Thank you!

## 2022-05-30 NOTE — Telephone Encounter (Signed)
DPR on file. Lmom for the pt wife with Dr. Tyrell Antonio recommendation.

## 2022-05-30 NOTE — Telephone Encounter (Signed)
If he continues to have nosebleed issues, I suggest referral to ENT for cauterization.

## 2022-06-04 ENCOUNTER — Encounter: Payer: Self-pay | Admitting: Dermatology

## 2022-06-12 DIAGNOSIS — Y830 Surgical operation with transplant of whole organ as the cause of abnormal reaction of the patient, or of later complication, without mention of misadventure at the time of the procedure: Secondary | ICD-10-CM | POA: Diagnosis not present

## 2022-06-12 DIAGNOSIS — Z7682 Awaiting organ transplant status: Secondary | ICD-10-CM | POA: Diagnosis not present

## 2022-06-12 DIAGNOSIS — J8410 Pulmonary fibrosis, unspecified: Secondary | ICD-10-CM | POA: Diagnosis not present

## 2022-06-12 DIAGNOSIS — J84112 Idiopathic pulmonary fibrosis: Secondary | ICD-10-CM | POA: Diagnosis not present

## 2022-06-12 DIAGNOSIS — R0602 Shortness of breath: Secondary | ICD-10-CM | POA: Diagnosis not present

## 2022-06-12 DIAGNOSIS — Z7982 Long term (current) use of aspirin: Secondary | ICD-10-CM | POA: Diagnosis not present

## 2022-06-12 DIAGNOSIS — Z87891 Personal history of nicotine dependence: Secondary | ICD-10-CM | POA: Diagnosis not present

## 2022-06-12 DIAGNOSIS — Z008 Encounter for other general examination: Secondary | ICD-10-CM | POA: Diagnosis not present

## 2022-06-12 DIAGNOSIS — Z4824 Encounter for aftercare following lung transplant: Secondary | ICD-10-CM | POA: Diagnosis not present

## 2022-06-12 DIAGNOSIS — Z79899 Other long term (current) drug therapy: Secondary | ICD-10-CM | POA: Diagnosis not present

## 2022-06-12 DIAGNOSIS — Z6710 Type A blood, Rh positive: Secondary | ICD-10-CM | POA: Diagnosis not present

## 2022-06-13 DIAGNOSIS — J449 Chronic obstructive pulmonary disease, unspecified: Secondary | ICD-10-CM | POA: Diagnosis not present

## 2022-06-16 DIAGNOSIS — R040 Epistaxis: Secondary | ICD-10-CM | POA: Diagnosis not present

## 2022-06-19 DIAGNOSIS — R0602 Shortness of breath: Secondary | ICD-10-CM | POA: Diagnosis not present

## 2022-07-07 ENCOUNTER — Encounter: Payer: Self-pay | Admitting: Internal Medicine

## 2022-07-07 ENCOUNTER — Ambulatory Visit (INDEPENDENT_AMBULATORY_CARE_PROVIDER_SITE_OTHER): Payer: BC Managed Care – PPO | Admitting: Internal Medicine

## 2022-07-07 VITALS — BP 126/76 | HR 79 | Ht 74.0 in | Wt 177.3 lb

## 2022-07-07 DIAGNOSIS — J449 Chronic obstructive pulmonary disease, unspecified: Secondary | ICD-10-CM

## 2022-07-07 DIAGNOSIS — E785 Hyperlipidemia, unspecified: Secondary | ICD-10-CM

## 2022-07-07 DIAGNOSIS — J8410 Pulmonary fibrosis, unspecified: Secondary | ICD-10-CM

## 2022-07-07 DIAGNOSIS — I10 Essential (primary) hypertension: Secondary | ICD-10-CM | POA: Diagnosis not present

## 2022-07-07 DIAGNOSIS — I2510 Atherosclerotic heart disease of native coronary artery without angina pectoris: Secondary | ICD-10-CM | POA: Diagnosis not present

## 2022-07-07 DIAGNOSIS — I2583 Coronary atherosclerosis due to lipid rich plaque: Secondary | ICD-10-CM

## 2022-07-07 NOTE — Assessment & Plan Note (Signed)
Stable at the present time. 

## 2022-07-07 NOTE — Assessment & Plan Note (Signed)
Refer to pulmonology.

## 2022-07-07 NOTE — Assessment & Plan Note (Signed)
Hypercholesterolemia  I advised the patient to follow Mediterranean diet This diet is rich in fruits vegetables and whole grain, and This diet is also rich in fish and lean meat Patient should also eat a handful of almonds or walnuts daily Recent heart study indicated that average follow-up on this kind of diet reduces the cardiovascular mortality by 50 to 70%== 

## 2022-07-07 NOTE — Progress Notes (Signed)
Established Patient Office Visit  Subjective:  Patient ID: Dillon [name], male    DOB: 29-Nov-1955  Age: 66 y.o. MRN: [id number]  CC:  Chief Complaint  Patient presents with   Follow-up    HPI  Dillon [name] presents for check up  Past Medical History:  Diagnosis Date   Coronary artery disease    Elevated cholesterol    Myocardial infarction [facility])    Pulmonary fibrosis ([place])    Restless leg syndrome     Past Surgical History:  Procedure Laterality Date   ANGIOPLASTY  2010   2 stents placed   BACK SURGERY     cardiac stents     x2   COLONOSCOPY     COLONOSCOPY WITH PROPOFOL N/A 03/05/2018   Procedure: COLONOSCOPY WITH PROPOFOL;  Surgeon: Virgel Manifold, MD;  Location: [facility] ENDOSCOPY;  Service: Endoscopy;  Laterality: N/A;   COLONOSCOPY WITH PROPOFOL N/A 01/24/2022   Procedure: COLONOSCOPY WITH PROPOFOL;  Surgeon: Jonathon Bellows, MD;  Location: [facility] ENDOSCOPY;  Service: Gastroenterology;  Laterality: N/A;   CORONARY ANGIOPLASTY     CORONARY/GRAFT ACUTE MI REVASCULARIZATION N/A 09/30/2019   Procedure: Coronary/Graft Acute MI Revascularization;  Surgeon: Wellington Hampshire, MD;  Location: [place] CV LAB;  Service: Cardiovascular;  Laterality: N/A;   LEFT HEART CATH AND CORONARY ANGIOGRAPHY N/A 09/30/2019   Procedure: LEFT HEART CATH AND CORONARY ANGIOGRAPHY;  Surgeon: Wellington Hampshire, MD;  Location: [place] CV LAB;  Service: Cardiovascular;  Laterality: N/A;   LEFT HEART CATH AND CORS/GRAFTS ANGIOGRAPHY N/A 10/01/2021   Procedure: LEFT HEART CATH AND CORS/GRAFTS ANGIOGRAPHY;  Surgeon: Nelva Bush, MD;  Location: [place] CV LAB;  Service: Cardiovascular;  Laterality: N/A;    Family History  Problem Relation Age of Onset   Heart disease Mother    Heart disease Father     Social History   Socioeconomic History   Marital status: Married    Spouse name: Not on file   Number of children: Not on file   Years of education: Not on file   Highest  education level: Not on file  Occupational History   Not on file  Tobacco Use   Smoking status: Former    Types: Cigarettes    Quit date: 03/05/2009    Years since quitting: 13.3   Smokeless tobacco: Never  Vaping Use   Vaping Use: Never used  Substance and Sexual Activity   Alcohol use: Not Currently    Comment: occassionally - 1 beer/month or less   Drug use: Not Currently   Sexual activity: Not Currently  Other Topics Concern   Not on file  Social History Narrative   Lives locally with wife.  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   Social Determinants of Health   Financial Resource Strain: Not on file  Food Insecurity: Not on file  Transportation Needs: Not on file  Physical Activity: Not on file  Stress: Not on file  Social Connections: Not on file  Intimate Partner Violence: Not on file     Current Outpatient Medications:    acetaminophen (TYLENOL) 500 MG tablet, Take 1,000 mg by mouth every 8 (eight) hours as needed for moderate pain., Disp: , Rfl:    ALPHA LIPOIC ACID PO, Take 500-1,000 mg by mouth daily., Disp: , Rfl:    aspirin EC 81 MG tablet, Take 81 mg by mouth daily. Swallow whole., Disp: , Rfl:    atorvastatin (LIPITOR) 40 MG  tablet, TAKE 1 TABLET BY MOUTH EVERY DAY, Disp: 90 tablet, Rfl: 2   carvedilol (COREG) 3.125 MG tablet, TAKE 1 TABLET BY MOUTH TWICE A DAY WITH A MEAL, Disp: 180 tablet, Rfl: 2   clopidogrel (PLAVIX) 75 MG tablet, Take 37.5 mg by mouth every other day., Disp: , Rfl:    enalapril (VASOTEC) 5 MG tablet, Take 1 tablet by mouth daily., Disp: , Rfl:    Fluticasone-Umeclidin-Vilant (TRELEGY ELLIPTA) 100-62.5-25 MCG/INH AEPB, Inhale 1 puff into the lungs daily., Disp: , Rfl:    nitroGLYCERIN (NITROSTAT) 0.4 MG SL tablet, Place 1 tablet (0.4 mg total) under the tongue every 5 (five) minutes x 3 doses as needed for chest pain., Disp: 20 tablet, Rfl: 12   Pirfenidone (ESBRIET) 267 MG CAPS, Take 3 capsules by mouth 3  (three) times daily., Disp: , Rfl:    triamcinolone ointment (KENALOG) 0.1 %, Apply 1 Application topically 2 (two) times daily. To itchy areas at neck and body for up to 2 weeks. Avoid applying to face, groin, and axilla. Use as directed. Long-term use can cause thinning of the skin., Disp: 454 g, Rfl: 0   Allergies  Allergen Reactions   Ace Inhibitors Swelling    Causes cough and swelling    ROS Review of Systems  Constitutional: Negative.   HENT: Negative.    Eyes: Negative.   Respiratory: Negative.    Cardiovascular: Negative.   Gastrointestinal: Negative.   Endocrine: Negative.   Genitourinary: Negative.   Musculoskeletal: Negative.   Skin: Negative.   Allergic/Immunologic: Negative.   Neurological: Negative.   Hematological: Negative.   Psychiatric/Behavioral: Negative.    All other systems reviewed and are negative.     Objective:    Physical Exam Vitals reviewed.  Constitutional:      Appearance: Normal appearance.  HENT:     Mouth/Throat:     Mouth: Mucous membranes are moist.  Eyes:     Pupils: Pupils are equal, round, and reactive to light.  Neck:     Vascular: No carotid bruit.  Cardiovascular:     Rate and Rhythm: Normal rate and regular rhythm.     Pulses: Normal pulses.     Heart sounds: Normal heart sounds.  Pulmonary:     Effort: Pulmonary effort is normal.     Breath sounds: Normal breath sounds.  Abdominal:     General: Bowel sounds are normal.     Palpations: Abdomen is soft. There is no hepatomegaly, splenomegaly or mass.     Tenderness: There is no abdominal tenderness.     Hernia: No hernia is present.  Musculoskeletal:     Cervical back: Neck supple.     Right lower leg: No edema.     Left lower leg: No edema.  Skin:    Findings: No rash.  Neurological:     Mental Status: He is alert and oriented to person, place, and time.     Motor: No weakness.  Psychiatric:        Mood and Affect: Mood normal.        Behavior: Behavior  normal.     BP 126/76   Pulse 79   Ht $R'6\' 2"'UN$  (1.88 m)   Wt 177 lb 4.8 oz (80.4 kg)   BMI 22.76 kg/m  Wt Readings from Last 3 Encounters:  07/07/22 177 lb 4.8 oz (80.4 kg)  05/15/22 183 lb 3.2 oz (83.1 kg)  05/06/22 182 lb 1.6 oz (82.6 kg)     Health Maintenance  Due  Topic Date Due   Hepatitis C Screening  Never done   Zoster Vaccines- Shingrix (1 of 2) Never done   Pneumonia Vaccine 64+ Years old (2 - PCV) 10/02/2020   COVID-19 Vaccine (4 - Pfizer risk series) 10/31/2020   INFLUENZA VACCINE  05/20/2022    There are no preventive care reminders to display for this patient.  Lab Results  Component Value Date   TSH 2.430 09/30/2021   Lab Results  Component Value Date   WBC 7.4 10/02/2021   HGB 13.6 10/02/2021   HCT 40.6 10/02/2021   MCV 99.8 10/02/2021   PLT 153 10/02/2021   Lab Results  Component Value Date   NA 134 (L) 10/02/2021   K 4.6 10/02/2021   CO2 28 10/02/2021   GLUCOSE 106 (H) 10/02/2021   BUN 13 10/02/2021   CREATININE 0.64 10/02/2021   BILITOT 0.5 07/01/2021   ALKPHOS 88 07/01/2021   AST 18 07/01/2021   ALT 21 07/01/2021   PROT 8.6 (H) 07/01/2021   ALBUMIN 3.7 07/01/2021   CALCIUM 8.5 (L) 10/02/2021   ANIONGAP 4 (L) 10/02/2021   EGFR 102 06/12/2021   Lab Results  Component Value Date   CHOL 118 10/01/2021   Lab Results  Component Value Date   HDL 45 10/01/2021   Lab Results  Component Value Date   LDLCALC 64 10/01/2021   Lab Results  Component Value Date   TRIG 45 10/01/2021   Lab Results  Component Value Date   CHOLHDL 2.6 10/01/2021   Lab Results  Component Value Date   HGBA1C 5.7 (H) 09/30/2021      Assessment & Plan:   Problem List Items Addressed This Visit       Cardiovascular and Mediastinum   Coronary artery disease due to lipid rich plaque - Primary   Essential hypertension    Stable at the present time        Respiratory   Pulmonary fibrosis ([place])    Refer to pulmonology      Chronic obstructive  pulmonary disease ([place])     Other   Hyperlipidemia    Hypercholesterolemia  I advised the patient to follow Mediterranean diet This diet is rich in fruits vegetables and whole grain, and This diet is also rich in fish and lean meat Patient should also eat a handful of almonds or walnuts daily Recent heart study indicated that average follow-up on this kind of diet reduces the cardiovascular mortality by 50 to 70%==       No orders of the defined types were placed in this encounter.   Follow-up: No follow-ups on file.    [name], MD

## 2022-07-14 DIAGNOSIS — J449 Chronic obstructive pulmonary disease, unspecified: Secondary | ICD-10-CM | POA: Diagnosis not present

## 2022-08-05 DIAGNOSIS — J849 Interstitial pulmonary disease, unspecified: Secondary | ICD-10-CM | POA: Diagnosis not present

## 2022-08-13 ENCOUNTER — Ambulatory Visit (INDEPENDENT_AMBULATORY_CARE_PROVIDER_SITE_OTHER): Payer: BC Managed Care – PPO | Admitting: Dermatology

## 2022-08-13 DIAGNOSIS — B079 Viral wart, unspecified: Secondary | ICD-10-CM | POA: Diagnosis not present

## 2022-08-13 DIAGNOSIS — L578 Other skin changes due to chronic exposure to nonionizing radiation: Secondary | ICD-10-CM

## 2022-08-13 DIAGNOSIS — J449 Chronic obstructive pulmonary disease, unspecified: Secondary | ICD-10-CM | POA: Diagnosis not present

## 2022-08-13 DIAGNOSIS — L738 Other specified follicular disorders: Secondary | ICD-10-CM

## 2022-08-13 DIAGNOSIS — L821 Other seborrheic keratosis: Secondary | ICD-10-CM

## 2022-08-13 DIAGNOSIS — L820 Inflamed seborrheic keratosis: Secondary | ICD-10-CM

## 2022-08-13 NOTE — Progress Notes (Signed)
   Follow-Up Visit   Subjective  Dillon [name] is a 66 y.o. male who presents for the following: Skin Problem (Patient here today for a spot under left eye, has gotten bigger and gets irritated. ).  Patient accompanied by wife who contributes to history.   The following portions of the chart were reviewed this encounter and updated as appropriate:   Tobacco  Allergies  Meds  Problems  Med Hx  Surg Hx  Fam Hx      Review of Systems:  No other skin or systemic complaints except as noted in HPI or Assessment and Plan.  Objective  Well appearing patient in no apparent distress; mood and affect are within normal limits.  A focused examination was performed including face. Relevant physical exam findings are noted in the Assessment and Plan.  left lower eyelid Verrucous papules -- Discussed viral etiology and contagion.   right cheek Erythematous stuck-on, waxy papule or plaque  face Small yellow papules with a central dell.     Assessment & Plan  Viral warts, unspecified type left lower eyelid  Discussed viral etiology and risk of spread.  Discussed multiple treatments may be required to clear warts.  Discussed possible post-treatment dyspigmentation and risk of recurrence.  Prior to procedure, discussed risks of blister formation, small wound, skin dyspigmentation, or rare scar following cryotherapy. Recommend Vaseline ointment to treated areas while healing.  If not improved on follow up consider biopsy.  Destruction of lesion - left lower eyelid  Destruction method: cryotherapy   Informed consent: discussed and consent obtained   Lesion destroyed using liquid nitrogen: Yes   Cryotherapy cycles:  2 Outcome: patient tolerated procedure well with no complications   Post-procedure details: wound care instructions given    Inflamed seborrheic keratosis right cheek  Symptomatic, irritating, patient would like treated.  Benign-appearing.  Observation.  Call clinic  for new or changing lesions.    Prior to procedure, discussed risks of blister formation, small wound, skin dyspigmentation, or rare scar following cryotherapy. Recommend Vaseline ointment to treated areas while healing.   Destruction of lesion - right cheek  Destruction method: cryotherapy   Informed consent: discussed and consent obtained   Lesion destroyed using liquid nitrogen: Yes   Cryotherapy cycles:  2 Outcome: patient tolerated procedure well with no complications   Post-procedure details: wound care instructions given    Sebaceous hyperplasia face  Benign-appearing.  Observation.  Call clinic for new or changing lesions.    Seborrheic Keratoses - Stuck-on, waxy, tan-brown papules and/or plaques  - Benign-appearing - Discussed benign etiology and prognosis. - Observe - Call for any changes  Actinic Damage - chronic, secondary to cumulative UV radiation exposure/sun exposure over time - diffuse scaly erythematous macules with underlying dyspigmentation - Recommend daily broad spectrum sunscreen SPF 30+ to sun-exposed areas, reapply every 2 hours as needed.  - Recommend staying in the shade or wearing long sleeves, sun glasses (UVA+UVB protection) and wide brim hats (4-inch brim around the entire circumference of the hat). - Call for new or changing lesions.  Return in about 4 weeks (around 09/10/2022) for Warts.  Graciella Belton, RMA, am acting as scribe for Forest Gleason, MD .  Documentation: I have reviewed the above documentation for accuracy and completeness, and I agree with the above.  Forest Gleason, MD

## 2022-08-13 NOTE — Patient Instructions (Addendum)
Cryotherapy Aftercare  Wash gently with soap and water everyday.   Apply Vaseline and Band-Aid daily until healed.    Recommend daily broad spectrum sunscreen SPF 30+ to sun-exposed areas, reapply every 2 hours as needed. Call for new or changing lesions.  Staying in the shade or wearing long sleeves, sun glasses (UVA+UVB protection) and wide brim hats (4-inch brim around the entire circumference of the hat) are also recommended for sun protection.    Due to recent changes in healthcare laws, you may see results of your pathology and/or laboratory studies on MyChart before the doctors have had a chance to review them. We understand that in some cases there may be results that are confusing or concerning to you. Please understand that not all results are received at the same time and often the doctors may need to interpret multiple results in order to provide you with the best plan of care or course of treatment. Therefore, we ask that you please give us 2 business days to thoroughly review all your results before contacting the office for clarification. Should we see a critical lab result, you will be contacted sooner.   If You Need Anything After Your Visit  If you have any questions or concerns for your doctor, please call our main line at [phone] and press option 4 to reach your doctor's medical assistant. If no one answers, please leave a voicemail as directed and we will return your call as soon as possible. Messages left after 4 pm will be answered the following business day.   You may also send us a message via MyChart. We typically respond to MyChart messages within 1-2 business days.  For prescription refills, please ask your pharmacy to contact our office. Our fax number is [phone].  If you have an urgent issue when the clinic is closed that cannot wait until the next business day, you can page your doctor at the number below.    Please note that while we do our best to be  available for urgent issues outside of office hours, we are not available 24/7.   If you have an urgent issue and are unable to reach us, you may choose to seek medical care at your doctor's office, retail clinic, urgent care center, or emergency room.  If you have a medical emergency, please immediately call 911 or go to the emergency department.  Pager Numbers  - Dr. Kowalski: [phone]  - Dr. Moye: [phone]  - Dr. Stewart: [phone]  In the event of inclement weather, please call our main line at [phone] for an update on the status of any delays or closures.  Dermatology Medication Tips: Please keep the boxes that topical medications come in in order to help keep track of the instructions about where and how to use these. Pharmacies typically print the medication instructions only on the boxes and not directly on the medication tubes.   If your medication is too expensive, please contact our office at [phone] option 4 or send us a message through MyChart.   We are unable to tell what your co-pay for medications will be in advance as this is different depending on your insurance coverage. However, we may be able to find a substitute medication at lower cost or fill out paperwork to get insurance to cover a needed medication.   If a prior authorization is required to get your medication covered by your insurance company, please allow us 1-2 business days to complete this process.    Drug prices often vary depending on where the prescription is filled and some pharmacies may offer cheaper prices.  The website www.goodrx.com contains coupons for medications through different pharmacies. The prices here do not account for what the cost may be with help from insurance (it may be cheaper with your insurance), but the website can give you the price if you did not use any insurance.  - You can print the associated coupon and take it with your prescription to the pharmacy.  -  You may also stop by our office during regular business hours and pick up a GoodRx coupon card.  - If you need your prescription sent electronically to a different pharmacy, notify our office through [facility] MyChart or by phone at [phone] option 4.     Si Usted Necesita Algo Despus de Su Visita  Tambin puede enviarnos un mensaje a travs de MyChart. Por lo general respondemos a los mensajes de MyChart en el transcurso de 1 a 2 das hbiles.  Para renovar recetas, por favor pida a su farmacia que se ponga en contacto con nuestra oficina. Nuestro nmero de fax es el [phone].  Si tiene un asunto urgente cuando la clnica est cerrada y que no puede esperar hasta el siguiente da hbil, puede llamar/localizar a su doctor(a) al nmero que aparece a continuacin.   Por favor, tenga en cuenta que aunque hacemos todo lo posible para estar disponibles para asuntos urgentes fuera del horario de oficina, no estamos disponibles las 24 horas del da, los 7 das de la semana.   Si tiene un problema urgente y no puede comunicarse con nosotros, puede optar por buscar atencin mdica  en el consultorio de su doctor(a), en una clnica privada, en un centro de atencin urgente o en una sala de emergencias.  Si tiene una emergencia mdica, por favor llame inmediatamente al 911 o vaya a la sala de emergencias.  Nmeros de bper  - Dr. Kowalski: [phone]  - Dra. Moye: [phone]  - Dra. Stewart: [phone]  En caso de inclemencias del tiempo, por favor llame a nuestra lnea principal al [phone] para una actualizacin sobre el estado de cualquier retraso o cierre.  Consejos para la medicacin en dermatologa: Por favor, guarde las cajas en las que vienen los medicamentos de uso tpico para ayudarle a seguir las instrucciones sobre dnde y cmo usarlos. Las farmacias generalmente imprimen las instrucciones del medicamento slo en las cajas y no directamente en los tubos del  medicamento.   Si su medicamento es muy caro, por favor, pngase en contacto con nuestra oficina llamando al [phone] y presione la opcin 4 o envenos un mensaje a travs de MyChart.   No podemos decirle cul ser su copago por los medicamentos por adelantado ya que esto es diferente dependiendo de la cobertura de su seguro. Sin embargo, es posible que podamos encontrar un medicamento sustituto a menor costo o llenar un formulario para que el seguro cubra el medicamento que se considera necesario.   Si se requiere una autorizacin previa para que su compaa de seguros cubra su medicamento, por favor permtanos de 1 a 2 das hbiles para completar este proceso.  Los precios de los medicamentos varan con frecuencia dependiendo del lugar de dnde se surte la receta y alguna farmacias pueden ofrecer precios ms baratos.  El sitio web www.goodrx.com tiene cupones para medicamentos de diferentes farmacias. Los precios aqu no tienen en cuenta lo que podra costar con la ayuda del seguro (puede ser ms   barato con su seguro), pero el sitio web puede darle el precio si no utiliz ningn seguro.  - Puede imprimir el cupn correspondiente y llevarlo con su receta a la farmacia.  - Tambin puede pasar por nuestra oficina durante el horario de atencin regular y recoger una tarjeta de cupones de GoodRx.  - Si necesita que su receta se enve electrnicamente a una farmacia diferente, informe a nuestra oficina a travs de MyChart de [facility] o por telfono llamando al [phone] y presione la opcin 4.  

## 2022-08-18 ENCOUNTER — Encounter: Payer: Self-pay | Admitting: Dermatology

## 2022-09-13 DIAGNOSIS — J449 Chronic obstructive pulmonary disease, unspecified: Secondary | ICD-10-CM | POA: Diagnosis not present

## 2022-09-19 DIAGNOSIS — E785 Hyperlipidemia, unspecified: Secondary | ICD-10-CM | POA: Diagnosis not present

## 2022-09-19 DIAGNOSIS — Z01818 Encounter for other preprocedural examination: Secondary | ICD-10-CM | POA: Diagnosis not present

## 2022-09-19 DIAGNOSIS — I2510 Atherosclerotic heart disease of native coronary artery without angina pectoris: Secondary | ICD-10-CM | POA: Diagnosis not present

## 2022-09-19 DIAGNOSIS — Z955 Presence of coronary angioplasty implant and graft: Secondary | ICD-10-CM | POA: Diagnosis not present

## 2022-09-19 DIAGNOSIS — Z7682 Awaiting organ transplant status: Secondary | ICD-10-CM | POA: Diagnosis not present

## 2022-09-19 DIAGNOSIS — J84112 Idiopathic pulmonary fibrosis: Secondary | ICD-10-CM | POA: Diagnosis not present

## 2022-09-19 DIAGNOSIS — Z87891 Personal history of nicotine dependence: Secondary | ICD-10-CM | POA: Diagnosis not present

## 2022-09-19 DIAGNOSIS — J849 Interstitial pulmonary disease, unspecified: Secondary | ICD-10-CM | POA: Diagnosis not present

## 2022-09-19 DIAGNOSIS — J9611 Chronic respiratory failure with hypoxia: Secondary | ICD-10-CM | POA: Diagnosis not present

## 2022-09-19 DIAGNOSIS — I252 Old myocardial infarction: Secondary | ICD-10-CM | POA: Diagnosis not present

## 2022-09-22 ENCOUNTER — Encounter: Payer: Self-pay | Admitting: Internal Medicine

## 2022-09-22 ENCOUNTER — Ambulatory Visit (INDEPENDENT_AMBULATORY_CARE_PROVIDER_SITE_OTHER): Payer: BC Managed Care – PPO | Admitting: Internal Medicine

## 2022-09-22 VITALS — BP 99/64 | HR 69 | Temp 98.4°F | Ht 73.0 in | Wt 171.4 lb

## 2022-09-22 DIAGNOSIS — J8410 Pulmonary fibrosis, unspecified: Secondary | ICD-10-CM

## 2022-09-22 DIAGNOSIS — Z792 Long term (current) use of antibiotics: Secondary | ICD-10-CM | POA: Diagnosis not present

## 2022-09-22 DIAGNOSIS — R509 Fever, unspecified: Secondary | ICD-10-CM | POA: Diagnosis not present

## 2022-09-22 DIAGNOSIS — J122 Parainfluenza virus pneumonia: Secondary | ICD-10-CM | POA: Diagnosis not present

## 2022-09-22 DIAGNOSIS — R0902 Hypoxemia: Secondary | ICD-10-CM | POA: Diagnosis not present

## 2022-09-22 DIAGNOSIS — Z79899 Other long term (current) drug therapy: Secondary | ICD-10-CM | POA: Diagnosis not present

## 2022-09-22 DIAGNOSIS — Z7902 Long term (current) use of antithrombotics/antiplatelets: Secondary | ICD-10-CM | POA: Diagnosis not present

## 2022-09-22 DIAGNOSIS — J440 Chronic obstructive pulmonary disease with acute lower respiratory infection: Secondary | ICD-10-CM | POA: Diagnosis not present

## 2022-09-22 DIAGNOSIS — I2510 Atherosclerotic heart disease of native coronary artery without angina pectoris: Secondary | ICD-10-CM | POA: Diagnosis not present

## 2022-09-22 DIAGNOSIS — Z20822 Contact with and (suspected) exposure to covid-19: Secondary | ICD-10-CM | POA: Diagnosis not present

## 2022-09-22 DIAGNOSIS — I252 Old myocardial infarction: Secondary | ICD-10-CM | POA: Diagnosis not present

## 2022-09-22 DIAGNOSIS — N390 Urinary tract infection, site not specified: Secondary | ICD-10-CM | POA: Diagnosis not present

## 2022-09-22 DIAGNOSIS — I10 Essential (primary) hypertension: Secondary | ICD-10-CM

## 2022-09-22 DIAGNOSIS — J9621 Acute and chronic respiratory failure with hypoxia: Secondary | ICD-10-CM | POA: Diagnosis not present

## 2022-09-22 DIAGNOSIS — Z87891 Personal history of nicotine dependence: Secondary | ICD-10-CM | POA: Diagnosis not present

## 2022-09-22 DIAGNOSIS — R0602 Shortness of breath: Secondary | ICD-10-CM | POA: Diagnosis not present

## 2022-09-22 DIAGNOSIS — R059 Cough, unspecified: Secondary | ICD-10-CM | POA: Diagnosis not present

## 2022-09-22 MED ORDER — AZITHROMYCIN 250 MG PO TABS: ORAL_TABLET | ORAL | 0 refills | Status: AC

## 2022-09-22 NOTE — Assessment & Plan Note (Signed)
Blood pressure is stable 

## 2022-09-22 NOTE — Progress Notes (Signed)
Established Patient Office Visit  Subjective:  Patient ID: Dillon [name], male    DOB: 1956/09/15  Age: 66 y.o. MRN: [id number]  CC:  Chief Complaint  Patient presents with   Diarrhea    Fever and cough since Friday afternoon. Home covid test negative today.     Diarrhea  Associated symptoms include coughing. Pertinent negatives include no arthralgias.    [name] presents for hypoxia  he has uri lately  Past Medical History:  Diagnosis Date   Coronary artery disease    Elevated cholesterol    Myocardial infarction [facility])    Pulmonary fibrosis (HCC)    Restless leg syndrome     Past Surgical History:  Procedure Laterality Date   ANGIOPLASTY  2010   2 stents placed   BACK SURGERY     cardiac stents     x2   COLONOSCOPY     COLONOSCOPY WITH PROPOFOL N/A 03/05/2018   Procedure: COLONOSCOPY WITH PROPOFOL;  Surgeon: Virgel Manifold, MD;  Location: [facility] ENDOSCOPY;  Service: Endoscopy;  Laterality: N/A;   COLONOSCOPY WITH PROPOFOL N/A 01/24/2022   Procedure: COLONOSCOPY WITH PROPOFOL;  Surgeon: Jonathon Bellows, MD;  Location: [facility] ENDOSCOPY;  Service: Gastroenterology;  Laterality: N/A;   CORONARY ANGIOPLASTY     CORONARY/GRAFT ACUTE MI REVASCULARIZATION N/A 09/30/2019   Procedure: Coronary/Graft Acute MI Revascularization;  Surgeon: Wellington Hampshire, MD;  Location: [place] CV LAB;  Service: Cardiovascular;  Laterality: N/A;   LEFT HEART CATH AND CORONARY ANGIOGRAPHY N/A 09/30/2019   Procedure: LEFT HEART CATH AND CORONARY ANGIOGRAPHY;  Surgeon: Wellington Hampshire, MD;  Location: [place] CV LAB;  Service: Cardiovascular;  Laterality: N/A;   LEFT HEART CATH AND CORS/GRAFTS ANGIOGRAPHY N/A 10/01/2021   Procedure: LEFT HEART CATH AND CORS/GRAFTS ANGIOGRAPHY;  Surgeon: Nelva Bush, MD;  Location: [place] CV LAB;  Service: Cardiovascular;  Laterality: N/A;    Family History  Problem Relation Age of Onset   Heart disease Mother    Heart disease  Father     Social History   Socioeconomic History   Marital status: Married    Spouse name: Not on file   Number of children: Not on file   Years of education: Not on file   Highest education level: Not on file  Occupational History   Not on file  Tobacco Use   Smoking status: Former    Types: Cigarettes    Quit date: 03/05/2009    Years since quitting: 13.5   Smokeless tobacco: Never  Vaping Use   Vaping Use: Never used  Substance and Sexual Activity   Alcohol use: Not Currently    Comment: occassionally - 1 beer/month or less   Drug use: Not Currently   Sexual activity: Not Currently  Other Topics Concern   Not on file  Social History Narrative   Lives locally with wife.  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   Social Determinants of Health   Financial Resource Strain: Not on file  Food Insecurity: Not on file  Transportation Needs: Not on file  Physical Activity: Not on file  Stress: Not on file  Social Connections: Not on file  Intimate Partner Violence: Not on file     Current Outpatient Medications:    acetaminophen (TYLENOL) 500 MG tablet, Take 1,000 mg by mouth every 8 (eight) hours as needed for moderate pain., Disp: , Rfl:    ALPHA LIPOIC ACID PO, Take 500-1,000 mg by  mouth daily., Disp: , Rfl:    aspirin EC 81 MG tablet, Take 81 mg by mouth daily. Swallow whole., Disp: , Rfl:    atorvastatin (LIPITOR) 40 MG tablet, TAKE 1 TABLET BY MOUTH EVERY DAY, Disp: 90 tablet, Rfl: 2   azithromycin (ZITHROMAX) 250 MG tablet, Take 2 tablets on day 1, then 1 tablet daily on days 2 through 5, Disp: 6 tablet, Rfl: 0   carvedilol (COREG) 3.125 MG tablet, TAKE 1 TABLET BY MOUTH TWICE A DAY WITH A MEAL, Disp: 180 tablet, Rfl: 2   clopidogrel (PLAVIX) 75 MG tablet, Take 37.5 mg by mouth every other day., Disp: , Rfl:    enalapril (VASOTEC) 5 MG tablet, Take 1 tablet by mouth daily., Disp: , Rfl:    Fluticasone-Umeclidin-Vilant (TRELEGY  ELLIPTA) 100-62.5-25 MCG/INH AEPB, Inhale 1 puff into the lungs daily., Disp: , Rfl:    nitroGLYCERIN (NITROSTAT) 0.4 MG SL tablet, Place 1 tablet (0.4 mg total) under the tongue every 5 (five) minutes x 3 doses as needed for chest pain., Disp: 20 tablet, Rfl: 12   OXYGEN, Inhale 4 L into the lungs continuous., Disp: , Rfl:    Pirfenidone (ESBRIET) 267 MG CAPS, Take 3 capsules by mouth 3 (three) times daily., Disp: , Rfl:    triamcinolone ointment (KENALOG) 0.1 %, Apply 1 Application topically 2 (two) times daily. To itchy areas at neck and body for up to 2 weeks. Avoid applying to face, groin, and axilla. Use as directed. Long-term use can cause thinning of the skin., Disp: 454 g, Rfl: 0   Allergies  Allergen Reactions   Ace Inhibitors Swelling    Causes cough and swelling    ROS Review of Systems  Constitutional:  Positive for fatigue.  HENT:  Positive for congestion.   Respiratory:  Positive for cough and shortness of breath. Negative for chest tightness and wheezing.   Gastrointestinal:  Positive for diarrhea.  Musculoskeletal:  Negative for arthralgias.  Neurological:  Negative for dizziness.      Objective:    Physical Exam Constitutional:      Appearance: He is normal weight. He is ill-appearing. He is not toxic-appearing or diaphoretic.  HENT:     Mouth/Throat:     Pharynx: Oropharyngeal exudate present.  Cardiovascular:     Rate and Rhythm: Normal rate.  Pulmonary:     Breath sounds: No rhonchi or rales.  Abdominal:     General: There is no distension.  Neurological:     Mental Status: He is alert.  Psychiatric:        Mood and Affect: Mood normal.     BP 99/64   Pulse 69   Temp 98.4 F (36.9 C) (Temporal)   Ht _0  (1.854 m)   Wt 171 lb 6.4 oz (77.7 kg)   SpO2 91%   BMI 22.61 kg/m  Wt Readings from Last 3 Encounters:  09/22/22 171 lb 6.4 oz (77.7 kg)  07/07/22 177 lb 4.8 oz (80.4 kg)  05/15/22 183 lb 3.2 oz (83.1 kg)     Health Maintenance Due   Topic Date Due   Hepatitis C Screening  Never done   Lung Cancer Screening  07/08/2022    There are no preventive care reminders to display for this patient.  Lab Results  Component Value Date   TSH 2.430 09/30/2021   Lab Results  Component Value Date   WBC 7.4 10/02/2021   HGB 13.6 10/02/2021   HCT 40.6 10/02/2021   MCV 99.8  10/02/2021   PLT 153 10/02/2021   Lab Results  Component Value Date   NA 134 (L) 10/02/2021   K 4.6 10/02/2021   CO2 28 10/02/2021   GLUCOSE 106 (H) 10/02/2021   BUN 13 10/02/2021   CREATININE 0.64 10/02/2021   BILITOT 0.5 07/01/2021   ALKPHOS 88 07/01/2021   AST 18 07/01/2021   ALT 21 07/01/2021   PROT 8.6 (H) 07/01/2021   ALBUMIN 3.7 07/01/2021   CALCIUM 8.5 (L) 10/02/2021   ANIONGAP 4 (L) 10/02/2021   EGFR 102 06/12/2021   Lab Results  Component Value Date   CHOL 118 10/01/2021   Lab Results  Component Value Date   HDL 45 10/01/2021   Lab Results  Component Value Date   LDLCALC 64 10/01/2021   Lab Results  Component Value Date   TRIG 45 10/01/2021   Lab Results  Component Value Date   CHOLHDL 2.6 10/01/2021   Lab Results  Component Value Date   HGBA1C 5.7 (H) 09/30/2021      Assessment & Plan:   Problem List Items Addressed This Visit   None   Meds ordered this encounter  Medications   azithromycin (ZITHROMAX) 250 MG tablet    Sig: Take 2 tablets on day 1, then 1 tablet daily on days 2 through 5    Dispense:  6 tablet    Refill:  0   Patient has chronic pulmonary fibrosis with hypoxia using oxygen at 4 L/min right now, oxygen saturation is 92.  On 4 L, with little bit effort he needs up to 10 L/min.  He is very tired and fatigued, I think upper respiratory infection has caused his respiratory decompensation, I will send him to the ER in the hospital to be taken care of Follow-up: No follow-ups on file.    [name], MD

## 2022-09-22 NOTE — Assessment & Plan Note (Signed)
Patient is on 4 L of oxygen, chest no rhonchi or rales.  Decreased breath sounds were noted.

## 2022-09-24 ENCOUNTER — Ambulatory Visit: Payer: BC Managed Care – PPO | Admitting: Dermatology

## 2022-09-30 ENCOUNTER — Ambulatory Visit: Payer: BC Managed Care – PPO | Admitting: Internal Medicine

## 2022-10-01 ENCOUNTER — Ambulatory Visit (INDEPENDENT_AMBULATORY_CARE_PROVIDER_SITE_OTHER): Payer: BC Managed Care – PPO | Admitting: Internal Medicine

## 2022-10-01 ENCOUNTER — Encounter: Payer: Self-pay | Admitting: Internal Medicine

## 2022-10-01 VITALS — BP 90/62 | HR 60 | Temp 97.5°F | Ht 73.0 in | Wt 174.1 lb

## 2022-10-01 DIAGNOSIS — J8410 Pulmonary fibrosis, unspecified: Secondary | ICD-10-CM

## 2022-10-01 DIAGNOSIS — I10 Essential (primary) hypertension: Secondary | ICD-10-CM

## 2022-10-01 DIAGNOSIS — M67912 Unspecified disorder of synovium and tendon, left shoulder: Secondary | ICD-10-CM

## 2022-10-01 DIAGNOSIS — R634 Abnormal weight loss: Secondary | ICD-10-CM | POA: Diagnosis not present

## 2022-10-01 NOTE — Assessment & Plan Note (Signed)
Blood pressure is low today

## 2022-10-01 NOTE — Assessment & Plan Note (Signed)
Patient is on 3 L of oxygen per minute, on his insistence, he   was released to work, light duty

## 2022-10-01 NOTE — Assessment & Plan Note (Signed)
Stable

## 2022-10-01 NOTE — Progress Notes (Signed)
Established Patient Office Visit  Subjective:  Patient ID: Dillon [name], male    DOB: [birth year]  Age: 66 y.o. MRN: [id number]  CC:  Chief Complaint  Patient presents with   Hospitalization Follow-up    Patient was in [place] with pneumonia. He has been doing a lot better since leaving hospital. Wants to go back to work. Oxygen level is 92 on 3 L of O2.     HPI  Dillon [name] presents for check up  Past Medical History:  Diagnosis Date   Coronary artery disease    Elevated cholesterol    Myocardial infarction [facility])    Pulmonary fibrosis ([place])    Restless leg syndrome     Past Surgical History:  Procedure Laterality Date   ANGIOPLASTY  2010   2 stents placed   BACK SURGERY     cardiac stents     x2   COLONOSCOPY     COLONOSCOPY WITH PROPOFOL N/A 03/05/2018   Procedure: COLONOSCOPY WITH PROPOFOL;  Surgeon: Virgel Manifold, MD;  Location: [facility] ENDOSCOPY;  Service: Endoscopy;  Laterality: N/A;   COLONOSCOPY WITH PROPOFOL N/A 01/24/2022   Procedure: COLONOSCOPY WITH PROPOFOL;  Surgeon: Jonathon Bellows, MD;  Location: [facility] ENDOSCOPY;  Service: Gastroenterology;  Laterality: N/A;   CORONARY ANGIOPLASTY     CORONARY/GRAFT ACUTE MI REVASCULARIZATION N/A 09/30/2019   Procedure: Coronary/Graft Acute MI Revascularization;  Surgeon: Wellington Hampshire, MD;  Location: [place] CV LAB;  Service: Cardiovascular;  Laterality: N/A;   LEFT HEART CATH AND CORONARY ANGIOGRAPHY N/A 09/30/2019   Procedure: LEFT HEART CATH AND CORONARY ANGIOGRAPHY;  Surgeon: Wellington Hampshire, MD;  Location: [place] CV LAB;  Service: Cardiovascular;  Laterality: N/A;   LEFT HEART CATH AND CORS/GRAFTS ANGIOGRAPHY N/A 10/01/2021   Procedure: LEFT HEART CATH AND CORS/GRAFTS ANGIOGRAPHY;  Surgeon: Nelva Bush, MD;  Location: [place] CV LAB;  Service: Cardiovascular;  Laterality: N/A;    Family History  Problem Relation Age of Onset   Heart disease Mother    Heart disease Father      Social History   Socioeconomic History   Marital status: Married    Spouse name: Not on file   Number of children: Not on file   Years of education: Not on file   Highest education level: Not on file  Occupational History   Not on file  Tobacco Use   Smoking status: Former    Types: Cigarettes    Quit date: 03/05/2009    Years since quitting: 13.5   Smokeless tobacco: Never  Vaping Use   Vaping Use: Never used  Substance and Sexual Activity   Alcohol use: Not Currently    Comment: occassionally - 1 beer/month or less   Drug use: Not Currently   Sexual activity: Not Currently  Other Topics Concern   Not on file  Social History Narrative   Lives locally with wife.  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   Social Determinants of Health   Financial Resource Strain: Not on file  Food Insecurity: Not on file  Transportation Needs: Not on file  Physical Activity: Not on file  Stress: Not on file  Social Connections: Not on file  Intimate Partner Violence: Not on file     Current Outpatient Medications:    acetaminophen (TYLENOL) 500 MG tablet, Take 1,000 mg by mouth every 8 (eight) hours as needed for moderate pain., Disp: , Rfl:    ALPHA LIPOIC ACID  PO, Take 500-1,000 mg by mouth daily., Disp: , Rfl:    aspirin EC 81 MG tablet, Take 81 mg by mouth daily. Swallow whole., Disp: , Rfl:    atorvastatin (LIPITOR) 40 MG tablet, TAKE 1 TABLET BY MOUTH EVERY DAY, Disp: 90 tablet, Rfl: 2   carvedilol (COREG) 3.125 MG tablet, TAKE 1 TABLET BY MOUTH TWICE A DAY WITH A MEAL, Disp: 180 tablet, Rfl: 2   clopidogrel (PLAVIX) 75 MG tablet, Take 37.5 mg by mouth every other day., Disp: , Rfl:    enalapril (VASOTEC) 5 MG tablet, Take 1 tablet by mouth daily., Disp: , Rfl:    Fluticasone-Umeclidin-Vilant (TRELEGY ELLIPTA) 100-62.5-25 MCG/INH AEPB, Inhale 1 puff into the lungs daily., Disp: , Rfl:    nitroGLYCERIN (NITROSTAT) 0.4 MG SL tablet, Place 1 tablet  (0.4 mg total) under the tongue every 5 (five) minutes x 3 doses as needed for chest pain., Disp: 20 tablet, Rfl: 12   OXYGEN, Inhale 4 L into the lungs continuous., Disp: , Rfl:    Pirfenidone (ESBRIET) 267 MG CAPS, Take 3 capsules by mouth 3 (three) times daily., Disp: , Rfl:    triamcinolone ointment (KENALOG) 0.1 %, Apply 1 Application topically 2 (two) times daily. To itchy areas at neck and body for up to 2 weeks. Avoid applying to face, groin, and axilla. Use as directed. Long-term use can cause thinning of the skin., Disp: 454 g, Rfl: 0   Allergies  Allergen Reactions   Ace Inhibitors Swelling    Causes cough and swelling    ROS Review of Systems  HENT:  Negative for hearing loss.   Respiratory:  Negative for cough and chest tightness.        On oxygen  3 litre /mt  Gastrointestinal:  Negative for abdominal distention.  Genitourinary:  Negative for dysuria.  Musculoskeletal:  Negative for arthralgias, back pain and gait problem.      Objective:    Physical Exam Cardiovascular:     Rate and Rhythm: Regular rhythm.     Heart sounds: Normal heart sounds.  Pulmonary:     Breath sounds: No wheezing or rhonchi.  Abdominal:     Palpations: Abdomen is soft.   Blood pressure is low today  BP 90/62   Pulse 60   Temp (!) 97.5 F (36.4 C) (Temporal)   Ht _0  (1.854 m)   Wt 174 lb 1.6 oz (79 kg)   SpO2 92%   BMI 22.97 kg/m  Wt Readings from Last 3 Encounters:  10/01/22 174 lb 1.6 oz (79 kg)  09/22/22 171 lb 6.4 oz (77.7 kg)  07/07/22 177 lb 4.8 oz (80.4 kg)     Health Maintenance Due  Topic Date Due   Hepatitis C Screening  Never done   Lung Cancer Screening  07/08/2022    There are no preventive care reminders to display for this patient.  Lab Results  Component Value Date   TSH 2.430 09/30/2021   Lab Results  Component Value Date   WBC 7.4 10/02/2021   HGB 13.6 10/02/2021   HCT 40.6 10/02/2021   MCV 99.8 10/02/2021   PLT 153 10/02/2021   Lab  Results  Component Value Date   NA 134 (L) 10/02/2021   K 4.6 10/02/2021   CO2 28 10/02/2021   GLUCOSE 106 (H) 10/02/2021   BUN 13 10/02/2021   CREATININE 0.64 10/02/2021   BILITOT 0.5 07/01/2021   ALKPHOS 88 07/01/2021   AST 18 07/01/2021   ALT  21 07/01/2021   PROT 8.6 (H) 07/01/2021   ALBUMIN 3.7 07/01/2021   CALCIUM 8.5 (L) 10/02/2021   ANIONGAP 4 (L) 10/02/2021   EGFR 102 06/12/2021   Lab Results  Component Value Date   CHOL 118 10/01/2021   Lab Results  Component Value Date   HDL 45 10/01/2021   Lab Results  Component Value Date   LDLCALC 64 10/01/2021   Lab Results  Component Value Date   TRIG 45 10/01/2021   Lab Results  Component Value Date   CHOLHDL 2.6 10/01/2021   Lab Results  Component Value Date   HGBA1C 5.7 (H) 09/30/2021      Assessment & Plan:   Problem List Items Addressed This Visit       Cardiovascular and Mediastinum   Essential hypertension - Primary    Blood pressure is low today        Respiratory   Pulmonary fibrosis (HCC)    Patient is on 3 L of oxygen per minute, on his insistence, he   was released to work, light duty        Musculoskeletal and Integument   Disorder of left rotator cuff    Stable        Other   Weight loss    Weight is stable       No orders of the defined types were placed in this encounter.   Follow-up: No follow-ups on file.    [name], MD

## 2022-10-01 NOTE — Assessment & Plan Note (Signed)
Weight is stable

## 2022-10-06 ENCOUNTER — Ambulatory Visit: Payer: BC Managed Care – PPO | Admitting: Internal Medicine

## 2022-10-07 ENCOUNTER — Encounter: Payer: Self-pay | Admitting: Cardiovascular Disease

## 2022-10-07 ENCOUNTER — Ambulatory Visit: Payer: BC Managed Care – PPO | Attending: Cardiovascular Disease | Admitting: Cardiovascular Disease

## 2022-10-07 VITALS — BP 128/72 | HR 67 | Ht 74.0 in | Wt 180.8 lb

## 2022-10-07 DIAGNOSIS — E785 Hyperlipidemia, unspecified: Secondary | ICD-10-CM

## 2022-10-07 DIAGNOSIS — I10 Essential (primary) hypertension: Secondary | ICD-10-CM

## 2022-10-07 DIAGNOSIS — I2510 Atherosclerotic heart disease of native coronary artery without angina pectoris: Secondary | ICD-10-CM

## 2022-10-07 NOTE — Patient Instructions (Signed)

## 2022-10-07 NOTE — Progress Notes (Signed)
Cardiology Office Note   Date:  10/07/2022   ID:  Dillon [name], DOB April 28, 1956, MRN [id number]  PCP:  [name], MD  Cardiologist:   [name], MD   Chief Complaint  Patient presents with   Follow-up    6 month f/u, no new cardiac concerns       History of Present Illness: Dillon [name] is a 66 y.o. male who presents for follow-up visit regarding coronary artery disease. He has known history of coronary artery disease, hyperlipidemia, pulmonary fibrosis and previous tobacco use. He presented in December 2020 with ST elevation myocardial infarction.  Cardiac catheterization showed occluded apical LAD supplying the distal inferior wall which was the culprit.  There was also 80% eccentric complex plaque rupture in the proximal LAD which was felt to be responsible for the distal embolization.  RCA stent was patent.  I performed successful PCI and drug-eluting stent placement to the proximal LAD.  I restored flow to the distal LAD just by wiring the occlusion site.  Ejection fraction was normal.  His myocardial infarction was in the setting of COVID-19 infection.  He presented in December 2022 with non-ST elevation myocardial infarction with peak troponin of 3800.  He underwent cardiac catheterization which showed patent stents in the proximal LAD and distal right coronary artery/right PDA.  No culprit was identified with normal ejection fraction and normal filling pressure.  Clopidogrel was resumed as it was previously stopped few months before his presentation due to recurrent nosebleed.  Echocardiogram in June 2023 showed normal LV systolic function with no significant valvular abnormalities.  He has advanced pulmonary fibrosis requiring 4 L supplemental oxygen.  He is currently taking half a dose of clopidogrel every other day and seems to be tolerating that.  No recent chest pain.  He was briefly hospitalized at [facility] for pneumonia but he seems to be back to his  baseline.   Past Medical History:  Diagnosis Date   Coronary artery disease    Elevated cholesterol    Myocardial infarction [facility])    Pulmonary fibrosis (HCC)    Restless leg syndrome     Past Surgical History:  Procedure Laterality Date   ANGIOPLASTY  2010   2 stents placed   BACK SURGERY     cardiac stents     x2   COLONOSCOPY     COLONOSCOPY WITH PROPOFOL N/A 03/05/2018   Procedure: COLONOSCOPY WITH PROPOFOL;  Surgeon: Virgel Manifold, MD;  Location: [facility] ENDOSCOPY;  Service: Endoscopy;  Laterality: N/A;   COLONOSCOPY WITH PROPOFOL N/A 01/24/2022   Procedure: COLONOSCOPY WITH PROPOFOL;  Surgeon: Jonathon Bellows, MD;  Location: [facility] ENDOSCOPY;  Service: Gastroenterology;  Laterality: N/A;   CORONARY ANGIOPLASTY     CORONARY/GRAFT ACUTE MI REVASCULARIZATION N/A 09/30/2019   Procedure: Coronary/Graft Acute MI Revascularization;  Surgeon: Wellington Hampshire, MD;  Location: [place] CV LAB;  Service: Cardiovascular;  Laterality: N/A;   LEFT HEART CATH AND CORONARY ANGIOGRAPHY N/A 09/30/2019   Procedure: LEFT HEART CATH AND CORONARY ANGIOGRAPHY;  Surgeon: Wellington Hampshire, MD;  Location: [place] CV LAB;  Service: Cardiovascular;  Laterality: N/A;   LEFT HEART CATH AND CORS/GRAFTS ANGIOGRAPHY N/A 10/01/2021   Procedure: LEFT HEART CATH AND CORS/GRAFTS ANGIOGRAPHY;  Surgeon: Nelva Bush, MD;  Location: [place] CV LAB;  Service: Cardiovascular;  Laterality: N/A;     Current Outpatient Medications  Medication Sig Dispense Refill   acetaminophen (TYLENOL) 500 MG tablet Take 1,000 mg by mouth every  8 (eight) hours as needed for moderate pain.     ALPHA LIPOIC ACID PO Take 500-1,000 mg by mouth daily.     atorvastatin (LIPITOR) 40 MG tablet TAKE 1 TABLET BY MOUTH EVERY DAY 90 tablet 2   carvedilol (COREG) 3.125 MG tablet TAKE 1 TABLET BY MOUTH TWICE A DAY WITH A MEAL 180 tablet 2   clopidogrel (PLAVIX) 75 MG tablet Take 37.5 mg by mouth every other day.      Fluticasone-Umeclidin-Vilant (TRELEGY ELLIPTA) 100-62.5-25 MCG/INH AEPB Inhale 1 puff into the lungs daily.     nitroGLYCERIN (NITROSTAT) 0.4 MG SL tablet Place 1 tablet (0.4 mg total) under the tongue every 5 (five) minutes x 3 doses as needed for chest pain. 20 tablet 12   OXYGEN Inhale 4 L into the lungs continuous.     Pirfenidone (ESBRIET) 267 MG CAPS Take 3 capsules by mouth 3 (three) times daily.     triamcinolone ointment (KENALOG) 0.1 % Apply 1 Application topically 2 (two) times daily. To itchy areas at neck and body for up to 2 weeks. Avoid applying to face, groin, and axilla. Use as directed. Long-term use can cause thinning of the skin. 454 g 0   aspirin EC 81 MG tablet Take 81 mg by mouth daily. Swallow whole. (Patient not taking: Reported on 10/07/2022)     enalapril (VASOTEC) 5 MG tablet Take 1 tablet by mouth daily. (Patient not taking: Reported on 10/07/2022)     No current facility-administered medications for this visit.    Allergies:   Ace inhibitors    Social History:  The patient  reports that he quit smoking about 13 years ago. His smoking use included cigarettes. He has never used smokeless tobacco. He reports that he does not currently use alcohol. He reports that he does not currently use drugs.   Family History:  The patient's family history includes Heart disease in his father and mother.    ROS:  Please see the history of present illness.   Otherwise, review of systems are positive for none.   All other systems are reviewed and negative.    PHYSICAL EXAM: VS:  BP 128/72 (BP Location: Left Arm, Patient Position: Sitting, Cuff Size: Normal)   Pulse 67   Ht '6\' 2"'$  (1.88 m)   Wt 180 lb 12.8 oz (82 kg)   SpO2 (!) 86%   BMI 23.21 kg/m  , BMI Body mass index is 23.21 kg/m. GEN: Well nourished, well developed, in no acute distress  HEENT: normal  Neck: no JVD, carotid bruits, or masses Cardiac: RRR; no murmurs, rubs, or gallops,no edema  Respiratory:  clear to  auscultation bilaterally, normal work of breathing GI: soft, nontender, nondistended, + BS MS: no deformity or atrophy  Skin: warm and dry, no rash Neuro:  Strength and sensation are intact Psych: euthymic mood, full affect   EKG:  EKG is ordered today. The ekg ordered today demonstrates normal sinus rhythm with no significant ST or T wave changes.   Recent Labs: No results found for requested labs within last 365 days.    Lipid Panel    Component Value Date/Time   CHOL 118 10/01/2021 0422   TRIG 45 10/01/2021 0422   HDL 45 10/01/2021 0422   CHOLHDL 2.6 10/01/2021 0422   VLDL 9 10/01/2021 0422   LDLCALC 64 10/01/2021 0422   LDLCALC 63 08/31/2020 1433      Wt Readings from Last 3 Encounters:  10/07/22 180 lb 12.8 oz (82  kg)  10/01/22 174 lb 1.6 oz (79 kg)  09/22/22 171 lb 6.4 oz (77.7 kg)           No data to display            ASSESSMENT AND PLAN:  1.  Coronary artery disease involving native coronary arteries without angina: He seems to be stable from a cardiac standpoint overall.  He is taking half dose clopidogrel every other day and seems to be tolerating this with no recurrent nosebleed.    2.  Hyperlipidemia: I reviewed most recent lipid profile in August which showed an LDL of 59.  Continue treatment with atorvastatin.  3.  Essential hypertension: Blood pressures controlled on small dose carvedilol.  4.  Interstitial lung disease: He follows at [facility] pulmonary and was considered for lung transplant but he is considering a second opinion and enrolling in a clinical study.     Disposition:   FU with me in 6 months  Signed,  [name], MD  10/07/2022 5:23 PM    [place]

## 2022-10-22 ENCOUNTER — Telehealth: Payer: Self-pay

## 2022-10-22 MED ORDER — FLUTICASONE-UMECLIDIN-VILANT 100-62.5-25 MCG/ACT IN AEPB: 1.0000 | INHALATION_SPRAY | Freq: Every day | RESPIRATORY_TRACT | 1 refills | Status: AC

## 2022-10-22 NOTE — Telephone Encounter (Signed)
Medication sent in. 

## 2022-10-22 NOTE — Telephone Encounter (Signed)
MEDICATION:Fluticasone-Umeclidin-Vilant (TRELEGY ELLIPTA) 100-62.5-25 MCG/INH AEPB   Comments: Patient is completely out, asking if we have any samples to give him.   **Let patient know to contact pharmacy at the end of the day to make sure medication is ready. **  ** Please notify patient to allow 48-72 hours to process**  **Encourage patient to contact the pharmacy for refills or they can request refills through [facility]**

## 2022-10-23 ENCOUNTER — Other Ambulatory Visit
Admission: RE | Admit: 2022-10-23 | Discharge: 2022-10-23 | Disposition: A | Discharge status: Home / Self Care | Payer: BC Managed Care – PPO | Source: Ambulatory Visit | Attending: Specialist | Admitting: Specialist

## 2022-10-23 DIAGNOSIS — R0789 Other chest pain: Secondary | ICD-10-CM | POA: Insufficient documentation

## 2022-10-23 DIAGNOSIS — R0602 Shortness of breath: Secondary | ICD-10-CM | POA: Insufficient documentation

## 2022-10-23 DIAGNOSIS — R0902 Hypoxemia: Secondary | ICD-10-CM | POA: Insufficient documentation

## 2022-10-23 DIAGNOSIS — J9621 Acute and chronic respiratory failure with hypoxia: Secondary | ICD-10-CM | POA: Diagnosis not present

## 2022-10-23 LAB — D-DIMER, QUANTITATIVE: D-Dimer, Quant: 0.51 ug/mL-FEU — ABNORMAL HIGH (ref 0.00–0.50)

## 2022-10-24 ENCOUNTER — Inpatient Hospital Stay
Admission: EM | Admit: 2022-10-24 | Discharge: 2022-10-30 | DRG: 189 | Disposition: A | Discharge status: Home / Self Care | Payer: BC Managed Care – PPO | Attending: Student | Admitting: Student

## 2022-10-24 ENCOUNTER — Emergency Department: Payer: BC Managed Care – PPO

## 2022-10-24 ENCOUNTER — Other Ambulatory Visit: Payer: Self-pay

## 2022-10-24 DIAGNOSIS — J8410 Pulmonary fibrosis, unspecified: Secondary | ICD-10-CM | POA: Diagnosis present

## 2022-10-24 DIAGNOSIS — Z7902 Long term (current) use of antithrombotics/antiplatelets: Secondary | ICD-10-CM

## 2022-10-24 DIAGNOSIS — Z87891 Personal history of nicotine dependence: Secondary | ICD-10-CM

## 2022-10-24 DIAGNOSIS — Z7951 Long term (current) use of inhaled steroids: Secondary | ICD-10-CM | POA: Diagnosis not present

## 2022-10-24 DIAGNOSIS — J9621 Acute and chronic respiratory failure with hypoxia: Secondary | ICD-10-CM | POA: Diagnosis present

## 2022-10-24 DIAGNOSIS — Z955 Presence of coronary angioplasty implant and graft: Secondary | ICD-10-CM

## 2022-10-24 DIAGNOSIS — Z7982 Long term (current) use of aspirin: Secondary | ICD-10-CM | POA: Diagnosis not present

## 2022-10-24 DIAGNOSIS — Z1152 Encounter for screening for COVID-19: Secondary | ICD-10-CM | POA: Diagnosis not present

## 2022-10-24 DIAGNOSIS — E7800 Pure hypercholesterolemia, unspecified: Secondary | ICD-10-CM | POA: Diagnosis present

## 2022-10-24 DIAGNOSIS — J441 Chronic obstructive pulmonary disease with (acute) exacerbation: Secondary | ICD-10-CM | POA: Diagnosis present

## 2022-10-24 DIAGNOSIS — B974 Respiratory syncytial virus as the cause of diseases classified elsewhere: Secondary | ICD-10-CM | POA: Diagnosis present

## 2022-10-24 DIAGNOSIS — R911 Solitary pulmonary nodule: Secondary | ICD-10-CM | POA: Diagnosis present

## 2022-10-24 DIAGNOSIS — Z79899 Other long term (current) drug therapy: Secondary | ICD-10-CM | POA: Diagnosis not present

## 2022-10-24 DIAGNOSIS — I10 Essential (primary) hypertension: Secondary | ICD-10-CM | POA: Diagnosis present

## 2022-10-24 DIAGNOSIS — Z9981 Dependence on supplemental oxygen: Secondary | ICD-10-CM

## 2022-10-24 DIAGNOSIS — Z8249 Family history of ischemic heart disease and other diseases of the circulatory system: Secondary | ICD-10-CM | POA: Diagnosis not present

## 2022-10-24 DIAGNOSIS — I252 Old myocardial infarction: Secondary | ICD-10-CM

## 2022-10-24 DIAGNOSIS — G2581 Restless legs syndrome: Secondary | ICD-10-CM | POA: Diagnosis present

## 2022-10-24 DIAGNOSIS — I2510 Atherosclerotic heart disease of native coronary artery without angina pectoris: Secondary | ICD-10-CM

## 2022-10-24 DIAGNOSIS — I2583 Coronary atherosclerosis due to lipid rich plaque: Secondary | ICD-10-CM | POA: Diagnosis present

## 2022-10-24 LAB — CBC
HCT: 42.2 % (ref 39.0–52.0)
Hemoglobin: 14.1 g/dL (ref 13.0–17.0)
MCH: 33.5 pg (ref 26.0–34.0)
MCHC: 33.4 g/dL (ref 30.0–36.0)
MCV: 100.2 fL — ABNORMAL HIGH (ref 80.0–100.0)
Platelets: 145 10*3/uL — ABNORMAL LOW (ref 150–400)
RBC: 4.21 MIL/uL — ABNORMAL LOW (ref 4.22–5.81)
RDW: 14.1 % (ref 11.5–15.5)
WBC: 6.9 10*3/uL (ref 4.0–10.5)
nRBC: 0 % (ref 0.0–0.2)

## 2022-10-24 LAB — PROCALCITONIN: Procalcitonin: <0.1 ng/mL

## 2022-10-24 LAB — TROPONIN I (HIGH SENSITIVITY)
Troponin I (High Sensitivity): 9 ng/L (ref <18)
Troponin I (High Sensitivity): 9 ng/L (ref <18)

## 2022-10-24 LAB — BASIC METABOLIC PANEL
Anion gap: 6 (ref 5–15)
BUN: 13 mg/dL (ref 8–23)
CO2: 26 mmol/L (ref 22–32)
Calcium: 8.7 mg/dL — ABNORMAL LOW (ref 8.9–10.3)
Chloride: 106 mmol/L (ref 98–111)
Creatinine, Ser: 0.72 mg/dL (ref 0.61–1.24)
GFR, Estimated: >60 mL/min (ref >60)
Glucose, Bld: 95 mg/dL (ref 70–99)
Potassium: 3.7 mmol/L (ref 3.5–5.1)
Sodium: 138 mmol/L (ref 135–145)

## 2022-10-24 LAB — RESP PANEL BY RT-PCR (RSV, FLU A&B, COVID)  RVPGX2
Influenza A by PCR: NEGATIVE
Influenza B by PCR: NEGATIVE
Resp Syncytial Virus by PCR: POSITIVE — AB
SARS Coronavirus 2 by RT PCR: NEGATIVE

## 2022-10-24 LAB — BRAIN NATRIURETIC PEPTIDE: B Natriuretic Peptide: 192.2 pg/mL — ABNORMAL HIGH (ref 0.0–100.0)

## 2022-10-24 MED ORDER — PREDNISONE 50 MG PO TABS
60.0000 mg | ORAL_TABLET | Freq: Every day | ORAL | Status: DC
  Administered 2022-10-25 – 2022-10-27 (×3): 60 mg via ORAL
  Filled 2022-10-24 (×2): qty 1
  Filled 2022-10-24: qty 3

## 2022-10-24 MED ORDER — IPRATROPIUM-ALBUTEROL 0.5-2.5 (3) MG/3ML IN SOLN
3.0000 mL | Freq: Once | RESPIRATORY_TRACT | Status: AC
  Administered 2022-10-24: 3 mL via RESPIRATORY_TRACT
  Filled 2022-10-24: qty 3

## 2022-10-24 MED ORDER — ENOXAPARIN SODIUM 40 MG/0.4ML IJ SOSY
40.0000 mg | PREFILLED_SYRINGE | INTRAMUSCULAR | Status: DC
  Administered 2022-10-24 – 2022-10-29 (×6): 40 mg via SUBCUTANEOUS
  Filled 2022-10-24 (×5): qty 0.4

## 2022-10-24 MED ORDER — ACETAMINOPHEN 650 MG RE SUPP: 650.0000 mg | Freq: Four times a day (QID) | RECTAL | Status: DC | PRN

## 2022-10-24 MED ORDER — UMECLIDINIUM BROMIDE 62.5 MCG/ACT IN AEPB
1.0000 | INHALATION_SPRAY | Freq: Every day | RESPIRATORY_TRACT | Status: DC
  Administered 2022-10-25 – 2022-10-30 (×6): 1 via RESPIRATORY_TRACT
  Filled 2022-10-24: qty 7

## 2022-10-24 MED ORDER — PIRFENIDONE 267 MG PO CAPS
3.0000 | ORAL_CAPSULE | Freq: Three times a day (TID) | ORAL | Status: DC
  Administered 2022-10-26 – 2022-10-30 (×14): 3 via ORAL
  Filled 2022-10-24 (×8): qty 3

## 2022-10-24 MED ORDER — FLUTICASONE FUROATE-VILANTEROL 100-25 MCG/ACT IN AEPB
1.0000 | INHALATION_SPRAY | Freq: Every day | RESPIRATORY_TRACT | Status: DC
  Administered 2022-10-25 – 2022-10-30 (×6): 1 via RESPIRATORY_TRACT
  Filled 2022-10-24: qty 28

## 2022-10-24 MED ORDER — FUROSEMIDE 10 MG/ML IJ SOLN
20.0000 mg | Freq: Once | INTRAMUSCULAR | Status: AC
  Administered 2022-10-24: 20 mg via INTRAVENOUS
  Filled 2022-10-24: qty 4

## 2022-10-24 MED ORDER — SODIUM CHLORIDE 0.9% FLUSH
3.0000 mL | Freq: Two times a day (BID) | INTRAVENOUS | Status: DC
  Administered 2022-10-24 – 2022-10-30 (×13): 3 mL via INTRAVENOUS

## 2022-10-24 MED ORDER — ACETAMINOPHEN 325 MG PO TABS: 650.0000 mg | ORAL_TABLET | Freq: Four times a day (QID) | ORAL | Status: DC | PRN

## 2022-10-24 MED ORDER — CARVEDILOL 3.125 MG PO TABS
3.1250 mg | ORAL_TABLET | Freq: Two times a day (BID) | ORAL | Status: DC
  Administered 2022-10-24 – 2022-10-30 (×11): 3.125 mg via ORAL
  Filled 2022-10-24 (×12): qty 1

## 2022-10-24 MED ORDER — IOHEXOL 350 MG/ML SOLN
75.0000 mL | Freq: Once | INTRAVENOUS | Status: AC | PRN
  Administered 2022-10-24: 75 mL via INTRAVENOUS

## 2022-10-24 MED ORDER — METHYLPREDNISOLONE SODIUM SUCC 125 MG IJ SOLR
125.0000 mg | Freq: Once | INTRAMUSCULAR | Status: AC
  Administered 2022-10-24: 125 mg via INTRAVENOUS
  Filled 2022-10-24: qty 2

## 2022-10-24 MED ORDER — ONDANSETRON HCL 4 MG/2ML IJ SOLN
4.0000 mg | Freq: Four times a day (QID) | INTRAMUSCULAR | Status: DC | PRN
  Administered 2022-10-29: 4 mg via INTRAVENOUS
  Filled 2022-10-24: qty 2

## 2022-10-24 MED ORDER — ONDANSETRON HCL 4 MG PO TABS: 4.0000 mg | ORAL_TABLET | Freq: Four times a day (QID) | ORAL | Status: DC | PRN

## 2022-10-24 MED ORDER — CLOPIDOGREL BISULFATE 75 MG PO TABS
37.5000 mg | ORAL_TABLET | ORAL | Status: DC
  Administered 2022-10-25 – 2022-10-29 (×3): 37.5 mg via ORAL
  Filled 2022-10-24 (×3): qty 1

## 2022-10-24 MED ORDER — IPRATROPIUM-ALBUTEROL 0.5-2.5 (3) MG/3ML IN SOLN
3.0000 mL | Freq: Four times a day (QID) | RESPIRATORY_TRACT | Status: DC
  Administered 2022-10-24 – 2022-10-29 (×19): 3 mL via RESPIRATORY_TRACT
  Filled 2022-10-24 (×20): qty 3

## 2022-10-24 MED ORDER — ATORVASTATIN CALCIUM 20 MG PO TABS
40.0000 mg | ORAL_TABLET | Freq: Every evening | ORAL | Status: DC
  Administered 2022-10-24 – 2022-10-29 (×6): 40 mg via ORAL
  Filled 2022-10-24 (×5): qty 2

## 2022-10-24 MED ORDER — ADULT MULTIVITAMIN W/MINERALS CH
1.0000 | ORAL_TABLET | Freq: Every day | ORAL | Status: DC
  Administered 2022-10-24 – 2022-10-30 (×7): 1 via ORAL
  Filled 2022-10-24 (×8): qty 1

## 2022-10-24 NOTE — Assessment & Plan Note (Signed)
No chest pain at this time.  -Continue home Coreg, Plavix and atorvastatin

## 2022-10-24 NOTE — ED Triage Notes (Addendum)
Pt comes with c/o increased sob. Pt went to pcp yesterday and was prescribed medications for this. Pt took 1 pill of antiobiotic yesterday and was suppose to start [place] today. Pt does also have elevated dimer and is on plavix. Pt does also have hx of pulmonary fibrosis.  Pt wears 4L [place] at home  Pt has labored breathing noted and some accessory muscle use.

## 2022-10-24 NOTE — ED Notes (Signed)
Unable to perform EKG in triage due to EKG machine not working. Shawn took pt back to ED 3 and Doroteo Bradford RN informed of pt coming. Shawn to perform EKG in room

## 2022-10-24 NOTE — H&P (Signed)
History and Physical    Patient: Dillon [name] YNW:[id number] DOB: 14-Oct-1956 DOA: 10/24/2022 DOS: the patient was seen and examined on 10/24/2022 PCP: [name], MD  Patient coming from: Home  Chief Complaint:  Chief Complaint  Patient presents with   Shortness of Breath   HPI: Dillon [name] is a 67 y.o. 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  [name] states that yesterday, he had sudden onset back pain and subsequently after developed trouble breathing.  His shortness of breath has continued and progressively worsened since.  In addition, he states that his cough has changed in nature.  He is occasionally coughing up sputum which is unusual for him.  He denies any fever, chills, nausea, vomiting, diarrhea, abdominal pain.  Of note, he states that he occasionally he experiences dysuria and hematuria that is intermittent.  He was checked for cancer at the transplant center but is unsure how far that this has been worked up.  ED course: On arrival to the ED, patient was normotensive at 125/79 with heart rate of 59.  He was saturating at 93% on 4 L with respiratory rate of 24.  Per EDP, patient requires increased to 6 L high flow at this time.  Initial workup remarkable for WBC of 6.9, platelets of 145, troponin of 9, creatinine 0.  7 2.  RSV PCR positive.  Due to positive D-dimer, CTA was obtained that did not show any evidence of PE.  TRH contacted for admission for acute on chronic hypoxic respiratory failure  Review of Systems: As mentioned in the history of present illness. All other systems reviewed and are negative.  Past Medical History:  Diagnosis Date   Coronary artery disease    Elevated cholesterol    Myocardial infarction [facility])    Pulmonary fibrosis (HCC)    Restless leg syndrome    Past Surgical History:   Procedure Laterality Date   ANGIOPLASTY  2010   2 stents placed   BACK SURGERY     cardiac stents     x2   COLONOSCOPY     COLONOSCOPY WITH PROPOFOL N/A 03/05/2018   Procedure: COLONOSCOPY WITH PROPOFOL;  Surgeon: Virgel Manifold, MD;  Location: [facility] ENDOSCOPY;  Service: Endoscopy;  Laterality: N/A;   COLONOSCOPY WITH PROPOFOL N/A 01/24/2022   Procedure: COLONOSCOPY WITH PROPOFOL;  Surgeon: Jonathon Bellows, MD;  Location: [facility] ENDOSCOPY;  Service: Gastroenterology;  Laterality: N/A;   CORONARY ANGIOPLASTY     CORONARY/GRAFT ACUTE MI REVASCULARIZATION N/A 09/30/2019   Procedure: Coronary/Graft Acute MI Revascularization;  Surgeon: Wellington Hampshire, MD;  Location: [place] CV LAB;  Service: Cardiovascular;  Laterality: N/A;   LEFT HEART CATH AND CORONARY ANGIOGRAPHY N/A 09/30/2019   Procedure: LEFT HEART CATH AND CORONARY ANGIOGRAPHY;  Surgeon: Wellington Hampshire, MD;  Location: [place] CV LAB;  Service: Cardiovascular;  Laterality: N/A;   LEFT HEART CATH AND CORS/GRAFTS ANGIOGRAPHY N/A 10/01/2021   Procedure: LEFT HEART CATH AND CORS/GRAFTS ANGIOGRAPHY;  Surgeon: Nelva Bush, MD;  Location: [place] CV LAB;  Service: Cardiovascular;  Laterality: N/A;   Social History:  reports that he quit smoking about 13 years ago. His smoking use included cigarettes. He has never used smokeless tobacco. He reports that he does not currently use alcohol. He reports that he does not currently use drugs.  Allergies  Allergen Reactions   Ace Inhibitors Swelling    Causes cough and swelling    Family History  Problem Relation Age of Onset   Heart disease Mother    Heart disease Father     Prior to Admission medications   Medication Sig Start Date End Date Taking? Authorizing Provider  acetaminophen (TYLENOL) 500 MG tablet Take 1,000 mg by mouth every 8 (eight) hours as needed for moderate pain.    [provider]  ALPHA LIPOIC ACID PO Take 500-1,000 mg by mouth daily.     [provider]  aspirin EC 81 MG tablet Take 81 mg by mouth daily. Swallow whole. Patient not taking: Reported on 10/07/2022    [provider]  atorvastatin (LIPITOR) 40 MG tablet TAKE 1 TABLET BY MOUTH EVERY DAY 03/19/22   [name], MD  carvedilol (COREG) 3.125 MG tablet TAKE 1 TABLET BY MOUTH TWICE A DAY WITH A MEAL 05/06/22   Wellington Hampshire, MD  clopidogrel (PLAVIX) 75 MG tablet Take 37.5 mg by mouth every other day.    [provider]  enalapril (VASOTEC) 5 MG tablet Take 1 tablet by mouth daily. Patient not taking: Reported on 10/07/2022 08/30/19   [provider]  Fluticasone-Umeclidin-Vilant (TRELEGY ELLIPTA) 100-62.5-25 MCG/ACT AEPB Inhale 1 puff into the lungs daily. 10/22/22   [name], MD  nitroGLYCERIN (NITROSTAT) 0.4 MG SL tablet Place 1 tablet (0.4 mg total) under the tongue every 5 (five) minutes x 3 doses as needed for chest pain. 10/02/21   Fritzi Mandes, MD  OXYGEN Inhale 4 L into the lungs continuous.    [provider]  Pirfenidone (ESBRIET) 267 MG CAPS Take 3 capsules by mouth 3 (three) times daily.    [provider]  triamcinolone ointment (KENALOG) 0.1 % Apply 1 Application topically 2 (two) times daily. To itchy areas at neck and body for up to 2 weeks. Avoid applying to face, groin, and axilla. Use as directed. Long-term use can cause thinning of the skin. 04/16/22   Moye, [place], MD    Physical Exam: Vitals:   10/24/22 0944 10/24/22 1300 10/24/22 1352  BP: 125/79  125/79  Pulse: (!) 59  78  Resp: (!) 24  18  Temp: 98 F (36.7 C)  98.2 F (36.8 C)  TempSrc:   Oral  SpO2: 93%  97%  Weight:  82.1 kg   Height:  '6\' 2"'$  (1.88 m)    Physical Exam Vitals and nursing note reviewed.  Constitutional:      General: He is not in acute distress.    Appearance: He is normal weight. He is not toxic-appearing.  HENT:     Head: Normocephalic and atraumatic.  Eyes:     Extraocular Movements: Extraocular  movements intact.     Pupils: Pupils are equal, round, and reactive to light.  Neck:     Vascular: No JVD.  Cardiovascular:     Rate and Rhythm: Normal rate and regular rhythm.     Heart sounds: No murmur heard. Pulmonary:     Effort: Pulmonary effort is normal. No tachypnea or respiratory distress.     Breath sounds: Wheezing (Intermittent, mild expiratory wheezing) and rales (Diffuse inspiratory fine crackles) present. No decreased breath sounds or rhonchi.  Abdominal:     Palpations: Abdomen is soft.     Tenderness: There is no abdominal tenderness. There is no guarding.  Musculoskeletal:     Cervical back: Neck supple.     Right  lower leg: No tenderness. No edema.     Left lower leg: No tenderness. No edema.  Skin:    General: Skin is warm and dry.  Neurological:     General: No focal deficit present.     Mental Status: He is alert and oriented to person, place, and time.  Psychiatric:        Mood and Affect: Mood normal.        Behavior: Behavior normal.    Data Reviewed: CBC with WBC of 6.9, hemoglobin of 14.1, MCV of 100.2, platelets of 145 BMP with sodium of 138, potassium of 3.7, bicarb of 26, BUN of 13, creatinine of 0.72 and calcium of 8.7.  GFR calculated above 60 D-dimer 0.51 Troponin 9 RSV PCR positive.  Influenza and COVID-19 PCR negative  EKG personally reviewed sinus rhythm with rate of 62.  No ST or T wave changes concerning for acute ischemia.  CT Angio Chest PE W and/or Wo Contrast  Result Date: 10/24/2022 CLINICAL DATA:  Increased shortness of breath.  Elevated D-dimer. EXAM: CT ANGIOGRAPHY CHEST WITH CONTRAST TECHNIQUE: Multidetector CT imaging of the chest was performed using the standard protocol during bolus administration of intravenous contrast. Multiplanar CT image reconstructions and MIPs were obtained to evaluate the vascular anatomy. RADIATION DOSE REDUCTION: This exam was performed according to the departmental dose-optimization program which  includes automated exposure control, adjustment of the mA and/or kV according to patient size and/or use of iterative reconstruction technique. CONTRAST:  67m OMNIPAQUE IOHEXOL 350 MG/ML SOLN COMPARISON:  07/08/2021 and 12/02/2019. FINDINGS: Cardiovascular: Negative for pulmonary embolus. Coronary artery calcification. Enlarged pulmonic trunk and heart. No pericardial effusion. Mediastinum/Nodes: Mediastinal lymph nodes measure up to 13 mm in the prevascular space, stable from 07/08/2021. Hilar lymph nodes measure up to 1.3 cm on the right, also similar. No axillary adenopathy. Esophagus is grossly unremarkable. Lungs/Pleura: Centrilobular and paraseptal emphysema. Superimposed subpleural reticulation, ground-glass and possible honeycombing. Nodular lesion in the apical left upper lobe measures 9 x 14 mm (5/40), possibly new from 07/07/2021 and clearly new from 12/02/2019. Mild basilar septal thickening. No pleural fluid. Airway is unremarkable. Upper Abdomen: Visualized portions of the liver, gallbladder, adrenal gland, kidneys, spleen, pancreas, stomach and bowel are grossly unremarkable. No upper abdominal adenopathy. Musculoskeletal: Degenerative changes in the spine. No worrisome lytic or sclerotic lesions. Review of the MIP images confirms the above findings. IMPRESSION: 1. Negative for pulmonary embolus. 2. Question mild pulmonary edema. 3. 11 mm apical left upper lobe nodule, possibly new from 07/08/2021 and clearly new from 12/02/2019, worrisome for bronchogenic carcinoma. Consider PET in further evaluation, as clinically indicated. 4. Pulmonary fibrosis, categorized as usual interstitial pneumonitis on 12/02/2019. 5. Small to borderline enlarged mediastinal hilar lymph nodes, seen in the setting of interstitial lung disease. 6.  Emphysema (ICD10-J43.9). 7. Coronary artery calcification. 8. Enlarged pulmonic trunk, indicative of pulmonary arterial hypertension. Electronically Signed   By: MLorin Picket M.D.   On: 10/24/2022 11:44   DG Chest 2 View  Result Date: 10/24/2022 CLINICAL DATA:  Shortness of breath EXAM: CHEST - 2 VIEW COMPARISON:  Chest x-rays dated September 30, 2021 and September 22, 2022. FINDINGS: Findings of interstitial lung disease. No definitive acute infiltrate noted. The heart, hila, mediastinum, lungs, and pleura otherwise unremarkable. No pneumothorax. IMPRESSION: Findings of interstitial lung disease. No definitive acute infiltrate. Electronically Signed   By: DDorise BullionIII M.D.   On: 10/24/2022 10:11    There are no new results to review at this  time.  Assessment and Plan: * Acute on chronic hypoxic respiratory failure (HCC) Patient has a history of IPF and COPD on 2 to 4 L supplemental oxygen at home and is currently presenting with 2-day history of shortness of breath in the setting of RSV.  Given some wheezing on examination, will treat for COPD exacerbation.   - Continue supplemental oxygen to maintain oxygen saturation above 88% - Wean as tolerated - S/p Solu-Medrol 125 mg once - Start start high-dose prednisone 60 mg tomorrow - Procalcitonin pending - DuoNebs every 6 hours - Continue home bronchodilators and Esbriet  Coronary artery disease due to lipid rich plaque No chest pain at this time.  -Continue home Coreg, Plavix and atorvastatin  Advance Care Planning:   Code Status: Full Code verified with patient.  He states he would not want long-term life support, but in the case of emergency, he would want resuscitative efforts to be tried.  Consults: None  Family Communication: Patient's daughter and wife updated at bedside  Severity of Illness: The appropriate patient status for this patient is INPATIENT. Inpatient status is judged to be reasonable and necessary in order to provide the required intensity of service to ensure the patient's safety. The patient's presenting symptoms, physical exam findings, and initial radiographic and laboratory data in  the context of their chronic comorbidities is felt to place them at high risk for further clinical deterioration. Furthermore, it is not anticipated that the patient will be medically stable for discharge from the hospital within 2 midnights of admission.   * I certify that at the point of admission it is my clinical judgment that the patient will require inpatient hospital care spanning beyond 2 midnights from the point of admission due to high intensity of service, high risk for further deterioration and high frequency of surveillance required.*  Author: Jose Persia, MD 10/24/2022 2:06 PM  For on call review [email or web address].

## 2022-10-24 NOTE — ED Provider Notes (Signed)
[facility] Provider Note    Event Date/Time   First MD Initiated Contact with Patient 10/24/22 [phone]     (approximate)   History   Shortness of Breath   HPI  Dillon [name] is a 67 y.o. male  here with SOB. Pt has h/o COPD, restrictive lung disease/IPF here with SOB. Pt was just seen by pulmonology yesterday, had a d-dimer sent and it was positive, told to come in. He was also given steroids, abx. He has had no major improvement in breathing but has had persistent SOB, wheezing. He has had sputum production. He has also had right sided severe sharp, pleuritic CP that began yesterday before being seen by his Pulmonologist, Dr. Raul Del.      Physical Exam   Triage Vital Signs: ED Triage Vitals  Enc Vitals Group     BP 10/24/22 0944 125/79     Pulse Rate 10/24/22 0944 (!) 59     Resp 10/24/22 0944 (!) 24     Temp 10/24/22 0944 98 F (36.7 C)     Temp src --      SpO2 10/24/22 0944 93 %     Weight --      Height --      Head Circumference --      Peak Flow --      Pain Score 10/24/22 0943 5     Pain Loc --      Pain Edu? --      Excl. in [place]? --     Most recent vital signs: Vitals:   10/24/22 1830 10/24/22 2030  BP: 125/71 122/77  Pulse: 63 (!) 56  Resp: (!) 24 (!) 22  Temp: 97.7 F (36.5 C) 97.9 F (36.6 C)  SpO2:  99%     General: Awake, no distress.  CV:  Good peripheral perfusion. RRR Resp:  Tachypneic with diminished aeration and bilateral rales. Mild wheezing noted. Abd:  No distention. No tenderness. Other:  No edema or asymmetry.   ED Results / Procedures / Treatments   Labs (all labs ordered are listed, but only abnormal results are displayed) Labs Reviewed  RESP PANEL BY RT-PCR (RSV, FLU A&B, COVID)  RVPGX2 - Abnormal; Notable for the following components:      Result Value   Resp Syncytial Virus by PCR POSITIVE (*)    All other components within normal limits  BASIC METABOLIC PANEL - Abnormal; Notable for the  following components:   Calcium 8.7 (*)    All other components within normal limits  CBC - Abnormal; Notable for the following components:   RBC 4.21 (*)    MCV 100.2 (*)    Platelets 145 (*)    All other components within normal limits  BRAIN NATRIURETIC PEPTIDE - Abnormal; Notable for the following components:   B Natriuretic Peptide 192.2 (*)    All other components within normal limits  PROCALCITONIN  HIV ANTIBODY (ROUTINE TESTING W REFLEX)  CBC  BASIC METABOLIC PANEL  TROPONIN I (HIGH SENSITIVITY)  TROPONIN I (HIGH SENSITIVITY)     EKG Normal sinus rhythm, trickle rate 62.  PR 151, QRS 102, QTc 434.  No acute ST elevations or depressions.  Acute evidence of acute ischemia or infarct.    RADIOLOGY CXR: ILD, no acute process CT Angio: Negative for PE, emphysema   I also independently reviewed and agree with radiologist interpretations.   PROCEDURES:  Critical Care performed: Yes, see critical care procedure note(s)  Procedures  MEDICATIONS ORDERED IN ED: Medications  enoxaparin (LOVENOX) injection 40 mg (has no administration in time range)  sodium chloride flush (NS) 0.9 % injection 3 mL (3 mLs Intravenous Given 10/24/22 2122)  acetaminophen (TYLENOL) tablet 650 mg (has no administration in time range)    Or  acetaminophen (TYLENOL) suppository 650 mg (has no administration in time range)  ondansetron (ZOFRAN) tablet 4 mg (has no administration in time range)    Or  ondansetron (ZOFRAN) injection 4 mg (has no administration in time range)  multivitamin with minerals tablet 1 tablet (1 tablet Oral Given 10/24/22 1622)  ipratropium-albuterol (DUONEB) 0.5-2.5 (3) MG/3ML nebulizer solution 3 mL (3 mLs Nebulization Given 10/24/22 2006)  predniSONE (DELTASONE) tablet 60 mg (has no administration in time range)  atorvastatin (LIPITOR) tablet 40 mg (40 mg Oral Given 10/24/22 1842)  carvedilol (COREG) tablet 3.125 mg (3.125 mg Oral Given 10/24/22 1622)  clopidogrel (PLAVIX)  tablet 37.5 mg (has no administration in time range)  fluticasone furoate-vilanterol (BREO ELLIPTA) 100-25 MCG/ACT 1 puff (has no administration in time range)    And  umeclidinium bromide (INCRUSE ELLIPTA) 62.5 MCG/ACT 1 puff (has no administration in time range)  Pirfenidone CAPS 3 capsule (0 capsules Oral Hold 10/24/22 1621)  methylPREDNISolone sodium succinate (SOLU-MEDROL) 125 mg/2 mL injection 125 mg (125 mg Intravenous Given 10/24/22 1036)  ipratropium-albuterol (DUONEB) 0.5-2.5 (3) MG/3ML nebulizer solution 3 mL (3 mLs Nebulization Given 10/24/22 1036)  iohexol (OMNIPAQUE) 350 MG/ML injection 75 mL (75 mLs Intravenous Contrast Given 10/24/22 1116)  ipratropium-albuterol (DUONEB) 0.5-2.5 (3) MG/3ML nebulizer solution 3 mL (3 mLs Nebulization Given 10/24/22 1302)  furosemide (LASIX) injection 20 mg (20 mg Intravenous Given 10/24/22 2005)     IMPRESSION / MDM / ASSESSMENT AND PLAN / ED COURSE  I reviewed the triage vital signs and the nursing notes.                              Differential diagnosis includes, but is not limited to, PE, PNA/CAP, bronchitis, ILD exacerbation, anemia, CHF, ACS.  Patient's presentation is most consistent with acute presentation with potential threat to life or bodily function.  The patient is on the cardiac monitor to evaluate for evidence of arrhythmia and/or significant heart rate changes.  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 Pt had + D-Dimer as outpt, CT obtained and shows no evidence of PE. EKG nonischemic and trop negative. BMP at baseline. CBC Unremarkable. Pt does have +RSV and he is requiring 6L [place] increased from his usual 4L. Will admit for acute on chronic hypoxia in setting of RSV. CP is likely chest wall pain from coughing no signs of ischemia. Trop negative.  FINAL CLINICAL IMPRESSION(S) / ED DIAGNOSES   Final diagnoses:  Acute on chronic hypoxic respiratory failure ([place])     Rx / DC  Orders   ED Discharge Orders     None        Note:  This document was prepared using Dragon voice recognition software and may include unintentional dictation errors.   Duffy Bruce, MD 10/24/22 2209

## 2022-10-24 NOTE — Assessment & Plan Note (Addendum)
Patient has a history of IPF and COPD on 2 to 4 L supplemental oxygen at home and is currently presenting with 2-day history of shortness of breath in the setting of RSV.  Given some wheezing on examination, will treat for COPD exacerbation.  BNP is elevated, with last BMP obtained 2 years prior.  However on examination patient appears euvolemic.  Questionable mild edema on CTA noted however, so could try a dose of Lasix.  - Continue supplemental oxygen to maintain oxygen saturation above 88% - Wean as tolerated - S/p Solu-Medrol 125 mg once - Start start high-dose prednisone 60 mg tomorrow - Procalcitonin pending - DuoNebs every 6 hours - Continue home bronchodilators and Esbriet - One-time dose of Lasix 20 mg IV - Strict in and out - Daily weight

## 2022-10-25 DIAGNOSIS — J9621 Acute and chronic respiratory failure with hypoxia: Secondary | ICD-10-CM | POA: Diagnosis not present

## 2022-10-25 LAB — CBC
HCT: 40.7 % (ref 39.0–52.0)
Hemoglobin: 13.7 g/dL (ref 13.0–17.0)
MCH: 33.5 pg (ref 26.0–34.0)
MCHC: 33.7 g/dL (ref 30.0–36.0)
MCV: 99.5 fL (ref 80.0–100.0)
Platelets: 137 10*3/uL — ABNORMAL LOW (ref 150–400)
RBC: 4.09 MIL/uL — ABNORMAL LOW (ref 4.22–5.81)
RDW: 13.5 % (ref 11.5–15.5)
WBC: 6.6 10*3/uL (ref 4.0–10.5)
nRBC: 0 % (ref 0.0–0.2)

## 2022-10-25 LAB — BASIC METABOLIC PANEL
Anion gap: 6 (ref 5–15)
BUN: 18 mg/dL (ref 8–23)
CO2: 27 mmol/L (ref 22–32)
Calcium: 8.6 mg/dL — ABNORMAL LOW (ref 8.9–10.3)
Chloride: 103 mmol/L (ref 98–111)
Creatinine, Ser: 0.72 mg/dL (ref 0.61–1.24)
GFR, Estimated: >60 mL/min (ref >60)
Glucose, Bld: 154 mg/dL — ABNORMAL HIGH (ref 70–99)
Potassium: 3.9 mmol/L (ref 3.5–5.1)
Sodium: 136 mmol/L (ref 135–145)

## 2022-10-25 LAB — PHOSPHORUS: Phosphorus: 3.7 mg/dL (ref 2.5–4.6)

## 2022-10-25 LAB — MAGNESIUM: Magnesium: 1.9 mg/dL (ref 1.7–2.4)

## 2022-10-25 LAB — HIV ANTIBODY (ROUTINE TESTING W REFLEX): HIV Screen 4th Generation wRfx: NONREACTIVE

## 2022-10-25 MED ORDER — VITAMIN D (ERGOCALCIFEROL) 1.25 MG (50000 UNIT) PO CAPS
50000.0000 [IU] | ORAL_CAPSULE | ORAL | Status: DC
  Administered 2022-10-25: 50000 [IU] via ORAL
  Filled 2022-10-25: qty 1

## 2022-10-25 NOTE — Progress Notes (Signed)
[facility] Progress Note  Patient: Dillon [name]    QIO:[id number]  DOA: 10/24/2022     Date of Service: the patient was seen and examined on 10/25/2022  Chief Complaint  Patient presents with   Shortness of Breath   Brief hospital course: [name] is a 67 y.o. 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  ED workup RSV positive, elevated D-dimer, CTA negative for PE. Patient was placed on 10 L oxygen via high flow nasal cannula, admitted for further management as below.   Assessment and Plan:  Acute on chronic hypoxic respiratory failure (HCC) Patient has a history of IPF and COPD on 2 to 4 L supplemental oxygen at home and is currently presenting with 2-day history of shortness of breath in the setting of RSV.  Given some wheezing on examination, will treat for COPD exacerbation.  - Continue supplemental oxygen to maintain oxygen saturation above 88% - Wean as tolerated - S/p Solu-Medrol 125 mg once 1/6 started high-dose prednisone 60 mg pod - Procalcitonin negative, WBC count within normal range - DuoNebs every 6 hours - Continue home bronchodilators and Esbriet   Coronary artery disease due to lipid rich plaque No chest pain at this time. -Continue home Coreg, Plavix and atorvastatin   Body mass index is 23.24 kg/m.  Interventions:    Diet: Regular diet DVT Prophylaxis: Subcutaneous Lovenox   Advance goals of care discussion: Full code  Family Communication: family was present at bedside, at the time of interview.  The pt provided permission to discuss medical plan with the family. Opportunity was given to ask question and all questions were answered satisfactorily.   Disposition:  Pt is from Home, admitted with Resp Failure, still on 10L HFNC, which precludes a safe discharge. Discharge to Home, when  stable, may need 2-3 days more to improve  Subjective: No significant overnight events, patient still having significant respiratory distress requiring 10 L oxygen via high flow nasal cannula.  Denies any chest pain or palpitation, no any other active issues.   Physical Exam: General: NAD, lying comfortably, mild SOB Appear in no distress, affect appropriate Eyes: PERRLA ENT: Oral Mucosa Clear, moist  Neck: no JVD,  Cardiovascular: S1 and S2 Present, no Murmur,  Respiratory: good respiratory effort, Bilateral Air entry equal and Decreased, mild bilateral crackles, no wheezes Abdomen: Bowel Sound present, Soft and no tenderness,  Skin: no rashes Extremities: no Pedal edema, no calf tenderness Neurologic: without any new focal findings Gait not checked due to patient safety concerns  Vitals:   10/25/22 1100 10/25/22 1150 10/25/22 1200 10/25/22 1500  BP: 99/66  101/71 106/72  Pulse: 62 86 66 62  Resp: 19 20 (!) 22 20  Temp:  97.7 F (36.5 C) (!) 97.5 F (36.4 C) 98.1 F (36.7 C)  TempSrc:  Oral Oral Oral  SpO2:  95% 95% 96%  Weight:      Height:        Intake/Output Summary (Last 24 hours) at 10/25/2022 1519 Last data filed at 10/25/2022 1455 Gross per 24 hour  Intake --  Output 1650 ml  Net -1650 ml   Filed Weights   10/24/22 1300  Weight: 82.1 kg    Data Reviewed: I have personally reviewed and interpreted daily labs, tele strips, imagings as discussed above. I reviewed all  nursing notes, pharmacy notes, vitals, pertinent old records I have discussed plan of care as described above with RN and patient/family.  CBC: Recent Labs  Lab 10/24/22 1022 10/25/22 0300  WBC 6.9 6.6  HGB 14.1 13.7  HCT 42.2 40.7  MCV 100.2* 99.5  PLT 145* 354*   Basic Metabolic Panel: Recent Labs  Lab 10/24/22 1022 10/25/22 0300  NA 138 136  K 3.7 3.9  CL 106 103  CO2 26 27  GLUCOSE 95 154*  BUN 13 18  CREATININE 0.72 0.72  CALCIUM 8.7* 8.6*  MG  --  1.9  PHOS  --  3.7     Studies: No results found.  Scheduled Meds:  atorvastatin  40 mg Oral QPM   carvedilol  3.125 mg Oral BID WC   clopidogrel  37.5 mg Oral QODAY   enoxaparin (LOVENOX) injection  40 mg Subcutaneous Q24H   fluticasone furoate-vilanterol  1 puff Inhalation Daily   And   umeclidinium bromide  1 puff Inhalation Daily   ipratropium-albuterol  3 mL Nebulization Q6H   multivitamin with minerals  1 tablet Oral Daily   Pirfenidone  3 capsule Oral TID   predniSONE  60 mg Oral Q breakfast   sodium chloride flush  3 mL Intravenous Q12H   Vitamin D (Ergocalciferol)  50,000 Units Oral Q7 days   Continuous Infusions: PRN Meds: acetaminophen **OR** acetaminophen, ondansetron **OR** ondansetron (ZOFRAN) IV  Time spent: 35 minutes  Author: Val Riles. MD [facility] 10/25/2022 3:19 PM  To reach On-call, see care teams to locate the attending and reach out to them via [email or web address]. If 7PM-7AM, please contact night-coverage If you still have difficulty reaching the attending provider, please page the [facility] (Director on Call) for [facility] on amion for assistance.

## 2022-10-26 ENCOUNTER — Encounter: Payer: Self-pay | Admitting: Internal Medicine

## 2022-10-26 DIAGNOSIS — J9621 Acute and chronic respiratory failure with hypoxia: Secondary | ICD-10-CM | POA: Diagnosis not present

## 2022-10-26 LAB — BASIC METABOLIC PANEL
Anion gap: 5 (ref 5–15)
BUN: 16 mg/dL (ref 8–23)
CO2: 27 mmol/L (ref 22–32)
Calcium: 8.4 mg/dL — ABNORMAL LOW (ref 8.9–10.3)
Chloride: 106 mmol/L (ref 98–111)
Creatinine, Ser: 0.61 mg/dL (ref 0.61–1.24)
GFR, Estimated: >60 mL/min (ref >60)
Glucose, Bld: 107 mg/dL — ABNORMAL HIGH (ref 70–99)
Potassium: 3.8 mmol/L (ref 3.5–5.1)
Sodium: 138 mmol/L (ref 135–145)

## 2022-10-26 LAB — CBC
HCT: 40.3 % (ref 39.0–52.0)
Hemoglobin: 13.6 g/dL (ref 13.0–17.0)
MCH: 33.7 pg (ref 26.0–34.0)
MCHC: 33.7 g/dL (ref 30.0–36.0)
MCV: 99.8 fL (ref 80.0–100.0)
Platelets: 139 10*3/uL — ABNORMAL LOW (ref 150–400)
RBC: 4.04 MIL/uL — ABNORMAL LOW (ref 4.22–5.81)
RDW: 13.8 % (ref 11.5–15.5)
WBC: 7.2 10*3/uL (ref 4.0–10.5)
nRBC: 0 % (ref 0.0–0.2)

## 2022-10-26 LAB — MAGNESIUM: Magnesium: 2.2 mg/dL (ref 1.7–2.4)

## 2022-10-26 LAB — PHOSPHORUS: Phosphorus: 3.4 mg/dL (ref 2.5–4.6)

## 2022-10-26 NOTE — Progress Notes (Signed)
[facility] Progress Note  Patient: Dillon [name]    QQI:[id number]  DOA: 10/24/2022     Date of Service: the patient was seen and examined on 10/26/2022  Chief Complaint  Patient presents with   Shortness of Breath   Brief hospital course: [name] is a 67 y.o. 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  ED workup RSV positive, elevated D-dimer, CTA negative for PE. Patient was placed on 10 L oxygen via high flow nasal cannula, admitted for further management as below.   Assessment and Plan:  Acute on chronic hypoxic respiratory failure (HCC) Patient has a history of IPF and COPD on 2 to 4 L supplemental oxygen at home and is currently presenting with 2-day history of shortness of breath in the setting of RSV.  Given some wheezing on examination, will treat for COPD exacerbation.  - Continue supplemental oxygen to maintain oxygen saturation above 88% - Wean as tolerated - S/p Solu-Medrol 125 mg once 1/6 started high-dose prednisone 60 mg pod - Procalcitonin negative, WBC count within normal range - DuoNebs every 6 hours - Continue home bronchodilators and Esbriet 1/7 currently patient is on 8 L oxygen via nasal cannula, feels improvement.   Coronary artery disease due to lipid rich plaque No chest pain at this time. -Continue home Coreg, Plavix and atorvastatin   Pulmonary nodule, incidental finding CTA 11 mm apical left upper lobe nodule, possibly new from 07/08/2021 and clearly new from 12/02/2019, worrisome for bronchogenic carcinoma. Consider PET in further evaluation, as clinically indicated. Patient was recommended to follow-up with PCP and pulmonologist as an outpatient.  Body mass index is 23.24 kg/m.  Interventions:    Diet: Regular diet DVT Prophylaxis: Subcutaneous Lovenox   Advance goals of care  discussion: Full code  Family Communication: family was present at bedside, at the time of interview.  The pt provided permission to discuss medical plan with the family. Opportunity was given to ask question and all questions were answered satisfactorily.   Disposition:  Pt is from Home, admitted with Resp Failure, still on 8L HFNC, which precludes a safe discharge. Discharge to Home, when stable, may need 2-3 days more to improve  Subjective: No significant overnight events, patient feels improvement in the shortness of breath, has mild cough.  Denies any chest pain encryption.  Still patient is on 10 L oxygen HFNC, we will continue to monitor and plan for disposition when stable.   Physical Exam: General: NAD, lying comfortably, mild SOB Appear in no distress, affect appropriate Eyes: PERRLA ENT: Oral Mucosa Clear, moist  Neck: no JVD,  Cardiovascular: S1 and S2 Present, no Murmur,  Respiratory: good respiratory effort, Bilateral Air entry equal and Decreased, mild bilateral crackles, no wheezes Abdomen: Bowel Sound present, Soft and no tenderness,  Skin: no rashes Extremities: no Pedal edema, no calf tenderness Neurologic: without any new focal findings Gait not checked due to patient safety concerns  Vitals:   10/26/22 0338 10/26/22 0720 10/26/22 0827 10/26/22 1252  BP: 102/60  117/76 (!) 129/98  Pulse: (!) 52  (!) 56 62  Resp: '18  17 18  '$ Temp: 98.7 F (37.1 C)  98 F (36.7 C) 97.8 F (36.6 C)  TempSrc: Oral     SpO2: 97% 94% 97% 96%  Weight:      Height:  Intake/Output Summary (Last 24 hours) at 10/26/2022 1536 Last data filed at 10/26/2022 1250 Gross per 24 hour  Intake 243 ml  Output 780 ml  Net -537 ml   Filed Weights   10/24/22 1300 10/25/22 2036  Weight: 82.1 kg 84.6 kg    Data Reviewed: I have personally reviewed and interpreted daily labs, tele strips, imagings as discussed above. I reviewed all nursing notes, pharmacy notes, vitals, pertinent old  records I have discussed plan of care as described above with RN and patient/family.  CBC: Recent Labs  Lab 10/24/22 1022 10/25/22 0300 10/26/22 0637  WBC 6.9 6.6 7.2  HGB 14.1 13.7 13.6  HCT 42.2 40.7 40.3  MCV 100.2* 99.5 99.8  PLT 145* 137* 993*   Basic Metabolic Panel: Recent Labs  Lab 10/24/22 1022 10/25/22 0300 10/26/22 0637  NA 138 136 138  K 3.7 3.9 3.8  CL 106 103 106  CO2 '26 27 27  '$ GLUCOSE 95 154* 107*  BUN '13 18 16  '$ CREATININE 0.72 0.72 0.61  CALCIUM 8.7* 8.6* 8.4*  MG  --  1.9 2.2  PHOS  --  3.7 3.4    Studies: No results found.  Scheduled Meds:  atorvastatin  40 mg Oral QPM   carvedilol  3.125 mg Oral BID WC   clopidogrel  37.5 mg Oral QODAY   enoxaparin (LOVENOX) injection  40 mg Subcutaneous Q24H   fluticasone furoate-vilanterol  1 puff Inhalation Daily   And   umeclidinium bromide  1 puff Inhalation Daily   ipratropium-albuterol  3 mL Nebulization Q6H   multivitamin with minerals  1 tablet Oral Daily   Pirfenidone  3 capsule Oral TID   predniSONE  60 mg Oral Q breakfast   sodium chloride flush  3 mL Intravenous Q12H   Vitamin D (Ergocalciferol)  50,000 Units Oral Q7 days   Continuous Infusions: PRN Meds: acetaminophen **OR** acetaminophen, ondansetron **OR** ondansetron (ZOFRAN) IV  Time spent: 35 minutes  Author: Val Riles. MD [facility] 10/26/2022 3:36 PM  To reach On-call, see care teams to locate the attending and reach out to them via [email or web address]. If 7PM-7AM, please contact night-coverage If you still have difficulty reaching the attending provider, please page the [facility] (Director on Call) for [facility] on amion for assistance.

## 2022-10-26 NOTE — Progress Notes (Signed)
Patient admitted to the unit, placed on telemetry, and a skin assessment was completed by myself and Chief Operating Officer. No skin issues noted at this time.

## 2022-10-27 DIAGNOSIS — J9621 Acute and chronic respiratory failure with hypoxia: Secondary | ICD-10-CM | POA: Diagnosis not present

## 2022-10-27 LAB — CBC
HCT: 38.9 % — ABNORMAL LOW (ref 39.0–52.0)
Hemoglobin: 13.1 g/dL (ref 13.0–17.0)
MCH: 33.9 pg (ref 26.0–34.0)
MCHC: 33.7 g/dL (ref 30.0–36.0)
MCV: 100.8 fL — ABNORMAL HIGH (ref 80.0–100.0)
Platelets: 137 10*3/uL — ABNORMAL LOW (ref 150–400)
RBC: 3.86 MIL/uL — ABNORMAL LOW (ref 4.22–5.81)
RDW: 13.5 % (ref 11.5–15.5)
WBC: 7.4 10*3/uL (ref 4.0–10.5)
nRBC: 0 % (ref 0.0–0.2)

## 2022-10-27 LAB — BASIC METABOLIC PANEL
Anion gap: 6 (ref 5–15)
BUN: 15 mg/dL (ref 8–23)
CO2: 29 mmol/L (ref 22–32)
Calcium: 8.3 mg/dL — ABNORMAL LOW (ref 8.9–10.3)
Chloride: 103 mmol/L (ref 98–111)
Creatinine, Ser: 0.6 mg/dL — ABNORMAL LOW (ref 0.61–1.24)
GFR, Estimated: >60 mL/min (ref >60)
Glucose, Bld: 100 mg/dL — ABNORMAL HIGH (ref 70–99)
Potassium: 3.7 mmol/L (ref 3.5–5.1)
Sodium: 138 mmol/L (ref 135–145)

## 2022-10-27 LAB — MAGNESIUM: Magnesium: 2.1 mg/dL (ref 1.7–2.4)

## 2022-10-27 LAB — PHOSPHORUS: Phosphorus: 2.6 mg/dL (ref 2.5–4.6)

## 2022-10-27 MED ORDER — PREDNISONE 20 MG PO TABS: 30.0000 mg | ORAL_TABLET | Freq: Every day | ORAL | Status: DC

## 2022-10-27 MED ORDER — PREDNISONE 50 MG PO TABS
50.0000 mg | ORAL_TABLET | Freq: Every day | ORAL | Status: AC
  Administered 2022-10-28 – 2022-10-30 (×3): 50 mg via ORAL
  Filled 2022-10-27 (×3): qty 1

## 2022-10-27 MED ORDER — PREDNISONE 20 MG PO TABS: 20.0000 mg | ORAL_TABLET | Freq: Every day | ORAL | Status: DC

## 2022-10-27 MED ORDER — PREDNISONE 10 MG PO TABS: 10.0000 mg | ORAL_TABLET | Freq: Every day | ORAL | Status: DC

## 2022-10-27 MED ORDER — PREDNISONE 20 MG PO TABS: 40.0000 mg | ORAL_TABLET | Freq: Every day | ORAL | Status: DC

## 2022-10-27 MED ORDER — OXYMETAZOLINE HCL 0.05 % NA SOLN
1.0000 | Freq: Two times a day (BID) | NASAL | Status: DC | PRN
  Administered 2022-10-27: 1 via NASAL
  Filled 2022-10-27: qty 15

## 2022-10-27 NOTE — Progress Notes (Signed)
Nutrition Brief Note  Patient identified on the Malnutrition Screening Tool (MST) Report  Wt Readings from Last 15 Encounters:  10/25/22 84.6 kg  10/07/22 82 kg  10/01/22 79 kg  09/22/22 77.7 kg  07/07/22 80.4 kg  05/15/22 83.1 kg  05/06/22 82.6 kg  04/09/22 83.2 kg  04/08/22 82.7 kg  04/07/22 80.3 kg  02/20/22 83.8 kg  02/04/22 84.4 kg  01/24/22 83.9 kg  01/20/22 82.6 kg  01/07/22 84.7 kg   Pt with medical history significant of IPF on Esbriet, chronic hypoxic respiratory failure on 2 to 4 L, CAD s/p DES to RCA and LAD, hypertension, COPD, who presents to the ED with shortness of breath.   Pt admitted with COPD exacerbation.   Reviewed I/O's: -1.1 L x 24 hours and -2.9 L since admission  UOP: 1.5 L x 24 hours  Spoke with pt and wife at bedside. Both pleasant and in good spirits today. Pt shares that he has a great appetite and has been consuming hospital meals as well as snacking on outside food brought in by wife ([employer] and potato chips). Per pt, he eats "everything and anything". He explains that he does not follow a consistent meal schedule secondary to working third shift.   Pt reports his UBW is 215#. He estimates he has lost about 30 pounds gradually over the past years secondary to pulmonary disease. He denies any changes in eating habits or activity level.  Reviewed wt hx; wt has been stable over the past 6 months.   Nutrition-Focused physical exam completed. Findings are no fat depletion, no muscle depletion, and no edema.    Pt denies any further nutritional needs at time of visit, but expressed appreciation for visit.   Body mass index is 23.93 kg/m. Patient meets criteria for none based on current BMI.   Current diet order is regular, patient is consuming approximately 100% of meals at this time. Labs and medications reviewed.   No nutrition interventions warranted at this time. If nutrition issues arise, please consult RD.   Loistine Chance, RD, LDN,  [place] Registered Dietitian II Certified Diabetes Care and Education Specialist Please refer to [facility] for RD and/or RD on-call/weekend/after hours pager

## 2022-10-27 NOTE — Progress Notes (Signed)
[facility] Progress Note  Patient: Dillon [name]    GEZ:[id number]  DOA: 10/24/2022     Date of Service: the patient was seen and examined on 10/27/2022  Chief Complaint  Patient presents with   Shortness of Breath   Brief hospital course: avid Gianluca Chhim is a 67 y.o. 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  ED workup RSV positive, elevated D-dimer, CTA negative for PE. Patient was placed on 10 L oxygen via high flow nasal cannula, admitted for further management as below.   Assessment and Plan:  Acute on chronic hypoxic respiratory failure (HCC) Patient has a history of IPF and COPD on 2 to 4 L supplemental oxygen at home and is currently presenting with 2-day history of shortness of breath in the setting of RSV.  Given some wheezing on examination, will treat for COPD exacerbation.  - Continue supplemental oxygen to maintain oxygen saturation above 88% - Wean as tolerated - S/p Solu-Medrol 125 mg once 1/6 started high-dose prednisone 60 mg pod x 3days, started tapering dose 10 mg every 3 days - Procalcitonin negative, WBC count within normal range - DuoNebs every 6 hours - Continue home bronchodilators and Esbriet 1/ 8currently patient is on 7 L oxygen via nasal cannula, feels improvement.   Coronary artery disease due to lipid rich plaque No chest pain at this time. -Continue home Coreg, Plavix and atorvastatin   Pulmonary nodule, incidental finding CTA 11 mm apical left upper lobe nodule, possibly new from 07/08/2021 and clearly new from 12/02/2019, worrisome for bronchogenic carcinoma. Consider PET in further evaluation, as clinically indicated. Patient was recommended to follow-up with PCP and pulmonologist as an outpatient.  Body mass index is 23.24 kg/m.  Interventions:    Diet: Regular diet DVT  Prophylaxis: Subcutaneous Lovenox   Advance goals of care discussion: Full code  Family Communication: family was present at bedside, at the time of interview.  The pt provided permission to discuss medical plan with the family. Opportunity was given to ask question and all questions were answered satisfactorily.   Disposition:  Pt is from Home, admitted with Resp Failure, still on 7L HFNC, which precludes a safe discharge. Discharge to Home, when stable, may need 2-3 days more to improve  Subjective: No significant overnight events, patient feels improvement in the shortness of breath, has mild cough.  Denies any chest pain encryption.  Still patient is on 7 L oxygen HFNC, we will continue to monitor and plan for disposition when stable.   Physical Exam: General: NAD, lying comfortably, mild SOB Appear in no distress, affect appropriate Eyes: PERRLA ENT: Oral Mucosa Clear, moist  Neck: no JVD,  Cardiovascular: S1 and S2 Present, no Murmur,  Respiratory: good respiratory effort, Bilateral Air entry equal and Decreased, mild bilateral crackles, mild wheezes b/l Abdomen: Bowel Sound present, Soft and no tenderness,  Skin: no rashes Extremities: no Pedal edema, no calf tenderness Neurologic: without any new focal findings Gait not checked due to patient safety concerns  Vitals:   10/27/22 0827 10/27/22 0903 10/27/22 1229 10/27/22 1313  BP: 106/68  122/79   Pulse: 65 66 65   Resp: 16  20   Temp: 97.9 F (36.6 C)  98.2 F (36.8 C)   TempSrc: Oral  Oral   SpO2: 97%  94% 93%  Weight:  Height:        Intake/Output Summary (Last 24 hours) at 10/27/2022 1522 Last data filed at 10/27/2022 0830 Gross per 24 hour  Intake --  Output 1350 ml  Net -1350 ml   Filed Weights   10/24/22 1300 10/25/22 2036  Weight: 82.1 kg 84.6 kg    Data Reviewed: I have personally reviewed and interpreted daily labs, tele strips, imagings as discussed above. I reviewed all nursing notes, pharmacy  notes, vitals, pertinent old records I have discussed plan of care as described above with RN and patient/family.  CBC: Recent Labs  Lab 10/24/22 1022 10/25/22 0300 10/26/22 0637 10/27/22 0511  WBC 6.9 6.6 7.2 7.4  HGB 14.1 13.7 13.6 13.1  HCT 42.2 40.7 40.3 38.9*  MCV 100.2* 99.5 99.8 100.8*  PLT 145* 137* 139* 657*   Basic Metabolic Panel: Recent Labs  Lab 10/24/22 1022 10/25/22 0300 10/26/22 0637 10/27/22 0511  NA 138 136 138 138  K 3.7 3.9 3.8 3.7  CL 106 103 106 103  CO2 '26 27 27 29  '$ GLUCOSE 95 154* 107* 100*  BUN '13 18 16 15  '$ CREATININE 0.72 0.72 0.61 0.60*  CALCIUM 8.7* 8.6* 8.4* 8.3*  MG  --  1.9 2.2 2.1  PHOS  --  3.7 3.4 2.6    Studies: No results found.  Scheduled Meds:  atorvastatin  40 mg Oral QPM   carvedilol  3.125 mg Oral BID WC   clopidogrel  37.5 mg Oral QODAY   enoxaparin (LOVENOX) injection  40 mg Subcutaneous Q24H   fluticasone furoate-vilanterol  1 puff Inhalation Daily   And   umeclidinium bromide  1 puff Inhalation Daily   ipratropium-albuterol  3 mL Nebulization Q6H   multivitamin with minerals  1 tablet Oral Daily   Pirfenidone  3 capsule Oral TID   predniSONE  60 mg Oral Q breakfast   sodium chloride flush  3 mL Intravenous Q12H   Vitamin D (Ergocalciferol)  50,000 Units Oral Q7 days   Continuous Infusions: PRN Meds: acetaminophen **OR** acetaminophen, ondansetron **OR** ondansetron (ZOFRAN) IV  Time spent: 35 minutes  Author: Val Riles. MD [facility] 10/27/2022 3:22 PM  To reach On-call, see care teams to locate the attending and reach out to them via [email or web address]. If 7PM-7AM, please contact night-coverage If you still have difficulty reaching the attending provider, please page the [facility] (Director on Call) for [facility] on amion for assistance.

## 2022-10-28 DIAGNOSIS — J9621 Acute and chronic respiratory failure with hypoxia: Secondary | ICD-10-CM | POA: Diagnosis not present

## 2022-10-28 LAB — CBC
HCT: 40.4 % (ref 39.0–52.0)
Hemoglobin: 13.4 g/dL (ref 13.0–17.0)
MCH: 33.3 pg (ref 26.0–34.0)
MCHC: 33.2 g/dL (ref 30.0–36.0)
MCV: 100.2 fL — ABNORMAL HIGH (ref 80.0–100.0)
Platelets: 151 10*3/uL (ref 150–400)
RBC: 4.03 MIL/uL — ABNORMAL LOW (ref 4.22–5.81)
RDW: 13.4 % (ref 11.5–15.5)
WBC: 7.5 10*3/uL (ref 4.0–10.5)
nRBC: 0 % (ref 0.0–0.2)

## 2022-10-28 LAB — BASIC METABOLIC PANEL
Anion gap: 4 — ABNORMAL LOW (ref 5–15)
BUN: 13 mg/dL (ref 8–23)
CO2: 29 mmol/L (ref 22–32)
Calcium: 8.3 mg/dL — ABNORMAL LOW (ref 8.9–10.3)
Chloride: 105 mmol/L (ref 98–111)
Creatinine, Ser: 0.6 mg/dL — ABNORMAL LOW (ref 0.61–1.24)
GFR, Estimated: >60 mL/min (ref >60)
Glucose, Bld: 91 mg/dL (ref 70–99)
Potassium: 3.8 mmol/L (ref 3.5–5.1)
Sodium: 138 mmol/L (ref 135–145)

## 2022-10-28 LAB — MAGNESIUM: Magnesium: 2.1 mg/dL (ref 1.7–2.4)

## 2022-10-28 LAB — PHOSPHORUS: Phosphorus: 3.2 mg/dL (ref 2.5–4.6)

## 2022-10-28 NOTE — Progress Notes (Signed)
  Transition of Care [facility]) Screening Note   Patient Details  Name: Dillon [name] Date of Birth: [birth year]   [facility] Care [facility]) CM/SW Contact:    Dillon Bash, LCSW Phone Number: 10/28/2022, 11:24 AM    Transition of Care Department [facility]) has reviewed patient and no TOC needs have been identified at this time. We will continue to monitor patient advancement through interdisciplinary progression rounds. If new patient transition needs arise, please place a TOC consult.  Dillon [name], Dillon [name], MSW, [place]

## 2022-10-28 NOTE — Progress Notes (Signed)
[facility] Progress Note  Patient: Dillon [name]    HKV:[id number]  DOA: 10/24/2022     Date of Service: the patient was seen and examined on 10/28/2022  Chief Complaint  Patient presents with   Shortness of Breath   Brief hospital course: [name] is a 67 y.o. 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  ED workup RSV positive, elevated D-dimer, CTA negative for PE. Patient was placed on 10 L oxygen via high flow nasal cannula, admitted for further management as below.   Assessment and Plan:  Acute on chronic hypoxic respiratory failure (HCC) Patient has a history of IPF and COPD on 2 to 4 L supplemental oxygen at home and is currently presenting with 2-day history of shortness of breath in the setting of RSV.  Given some wheezing on examination, will treat for COPD exacerbation.  - Continue supplemental oxygen to maintain oxygen saturation above 88% - Wean as tolerated - S/p Solu-Medrol 125 mg once 1/6 started high-dose prednisone 60 mg pod x 3days, started tapering dose 10 mg every 3 days - Procalcitonin negative, WBC count within normal range - DuoNebs every 6 hours - Continue home bronchodilators and Esbriet 1/9 currently patient is on 7 L oxygen via nasal cannula, feels improvement.   Coronary artery disease due to lipid rich plaque No chest pain at this time. -Continue home Coreg, Plavix and atorvastatin   Pulmonary nodule, incidental finding CTA 11 mm apical left upper lobe nodule, possibly new from 07/08/2021 and clearly new from 12/02/2019, worrisome for bronchogenic carcinoma. Consider PET in further evaluation, as clinically indicated. Patient was recommended to follow-up with PCP and pulmonologist as an outpatient.  Body mass index is 23.24 kg/m.  Interventions:    Diet: Regular diet DVT  Prophylaxis: Subcutaneous Lovenox   Advance goals of care discussion: Full code  Family Communication: family was present at bedside, at the time of interview.  The pt provided permission to discuss medical plan with the family. Opportunity was given to ask question and all questions were answered satisfactorily.   Disposition:  Pt is from Home, admitted with Resp Failure, still on 7L HFNC, which precludes a safe discharge. Discharge to Home, when stable, may need 2-3 days more to improve  Subjective: No significant overnight events, patient feels improvement in the shortness of breath, has mild cough.  Denies any chest pain encryption.  Still patient is on 7 L oxygen HFNC, we will continue to monitor and plan for disposition when stable.   Physical Exam: General: NAD, lying comfortably, mild SOB Appear in no distress, affect appropriate Eyes: PERRLA ENT: Oral Mucosa Clear, moist  Neck: no JVD,  Cardiovascular: S1 and S2 Present, no Murmur,  Respiratory: good respiratory effort, Bilateral Air entry equal and Decreased, mild bilateral crackles, mild wheezes b/l Abdomen: Bowel Sound present, Soft and no tenderness,  Skin: no rashes Extremities: no Pedal edema, no calf tenderness Neurologic: without any new focal findings Gait not checked due to patient safety concerns  Vitals:   10/28/22 0745 10/28/22 0849 10/28/22 1258 10/28/22 1432  BP:  118/82 108/79   Pulse:  69 66   Resp:  20 18   Temp:  98.2 F (36.8 C) 98.2 F (36.8 C)   TempSrc:  Oral Oral   SpO2: 95% 98% 96% 96%  Weight:  Height:        Intake/Output Summary (Last 24 hours) at 10/28/2022 1446 Last data filed at 10/28/2022 1300 Gross per 24 hour  Intake 363 ml  Output 2325 ml  Net -1962 ml   Filed Weights   10/24/22 1300 10/25/22 2036  Weight: 82.1 kg 84.6 kg    Data Reviewed: I have personally reviewed and interpreted daily labs, tele strips, imagings as discussed above. I reviewed all nursing notes,  pharmacy notes, vitals, pertinent old records I have discussed plan of care as described above with RN and patient/family.  CBC: Recent Labs  Lab 10/24/22 1022 10/25/22 0300 10/26/22 0637 10/27/22 0511 10/28/22 0622  WBC 6.9 6.6 7.2 7.4 7.5  HGB 14.1 13.7 13.6 13.1 13.4  HCT 42.2 40.7 40.3 38.9* 40.4  MCV 100.2* 99.5 99.8 100.8* 100.2*  PLT 145* 137* 139* 137* 034   Basic Metabolic Panel: Recent Labs  Lab 10/24/22 1022 10/25/22 0300 10/26/22 0637 10/27/22 0511 10/28/22 0622  NA 138 136 138 138 138  K 3.7 3.9 3.8 3.7 3.8  CL 106 103 106 103 105  CO2 '26 27 27 29 29  '$ GLUCOSE 95 154* 107* 100* 91  BUN '13 18 16 15 13  '$ CREATININE 0.72 0.72 0.61 0.60* 0.60*  CALCIUM 8.7* 8.6* 8.4* 8.3* 8.3*  MG  --  1.9 2.2 2.1 2.1  PHOS  --  3.7 3.4 2.6 3.2    Studies: No results found.  Scheduled Meds:  atorvastatin  40 mg Oral QPM   carvedilol  3.125 mg Oral BID WC   clopidogrel  37.5 mg Oral QODAY   enoxaparin (LOVENOX) injection  40 mg Subcutaneous Q24H   fluticasone furoate-vilanterol  1 puff Inhalation Daily   And   umeclidinium bromide  1 puff Inhalation Daily   ipratropium-albuterol  3 mL Nebulization Q6H   multivitamin with minerals  1 tablet Oral Daily   Pirfenidone  3 capsule Oral TID   predniSONE  50 mg Oral Q breakfast   Followed by   Derrill Memo ON 10/31/2022] predniSONE  40 mg Oral Q breakfast   Followed by   Derrill Memo ON 11/03/2022] predniSONE  30 mg Oral Q breakfast   Followed by   Derrill Memo ON 11/06/2022] predniSONE  20 mg Oral Q breakfast   Followed by   Derrill Memo ON 11/09/2022] predniSONE  10 mg Oral Q breakfast   sodium chloride flush  3 mL Intravenous Q12H   Vitamin D (Ergocalciferol)  50,000 Units Oral Q7 days   Continuous Infusions: PRN Meds: acetaminophen **OR** acetaminophen, ondansetron **OR** ondansetron (ZOFRAN) IV, oxymetazoline  Time spent: 35 minutes  Author: Val Riles. MD [facility] 10/28/2022 2:46 PM  To reach On-call, see care teams to locate  the attending and reach out to them via [email or web address]. If 7PM-7AM, please contact night-coverage If you still have difficulty reaching the attending provider, please page the [facility] (Director on Call) for [facility] on amion for assistance.

## 2022-10-29 DIAGNOSIS — J9621 Acute and chronic respiratory failure with hypoxia: Secondary | ICD-10-CM | POA: Diagnosis not present

## 2022-10-29 LAB — BASIC METABOLIC PANEL
Anion gap: 7 (ref 5–15)
BUN: 14 mg/dL (ref 8–23)
CO2: 27 mmol/L (ref 22–32)
Calcium: 8.5 mg/dL — ABNORMAL LOW (ref 8.9–10.3)
Chloride: 103 mmol/L (ref 98–111)
Creatinine, Ser: 0.65 mg/dL (ref 0.61–1.24)
GFR, Estimated: >60 mL/min (ref >60)
Glucose, Bld: 96 mg/dL (ref 70–99)
Potassium: 4 mmol/L (ref 3.5–5.1)
Sodium: 137 mmol/L (ref 135–145)

## 2022-10-29 LAB — CBC
HCT: 41.9 % (ref 39.0–52.0)
Hemoglobin: 14 g/dL (ref 13.0–17.0)
MCH: 33.1 pg (ref 26.0–34.0)
MCHC: 33.4 g/dL (ref 30.0–36.0)
MCV: 99.1 fL (ref 80.0–100.0)
Platelets: 171 10*3/uL (ref 150–400)
RBC: 4.23 MIL/uL (ref 4.22–5.81)
RDW: 13.6 % (ref 11.5–15.5)
WBC: 8.6 10*3/uL (ref 4.0–10.5)
nRBC: 0 % (ref 0.0–0.2)

## 2022-10-29 MED ORDER — IPRATROPIUM-ALBUTEROL 0.5-2.5 (3) MG/3ML IN SOLN
3.0000 mL | Freq: Three times a day (TID) | RESPIRATORY_TRACT | Status: DC
  Administered 2022-10-30: 3 mL via RESPIRATORY_TRACT
  Filled 2022-10-29 (×2): qty 3

## 2022-10-29 MED ORDER — IPRATROPIUM-ALBUTEROL 0.5-2.5 (3) MG/3ML IN SOLN: 3.0000 mL | Freq: Four times a day (QID) | RESPIRATORY_TRACT | Status: DC | PRN

## 2022-10-29 NOTE — Progress Notes (Signed)
[facility] Progress Note  Patient: Dillon [name]    RFF:[id number]  DOA: 10/24/2022     Date of Service: the patient was seen and examined on 10/29/2022  Chief Complaint  Patient presents with   Shortness of Breath   Brief hospital course: [name] is a 67 y.o. 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  ED workup RSV positive, elevated D-dimer, CTA negative for PE. Patient was placed on 10 L oxygen via high flow nasal cannula, admitted for further management as below.   Assessment and Plan:  Acute on chronic hypoxic respiratory failure (HCC) Patient has a history of IPF and COPD on 2 to 4 L supplemental oxygen at home and is currently presenting with 2-day history of shortness of breath in the setting of RSV.  Given some wheezing on examination, will treat for COPD exacerbation.  - Continue supplemental oxygen to maintain oxygen saturation above 88% - Wean as tolerated - S/p Solu-Medrol 125 mg once 1/6 started high-dose prednisone 60 mg pod x 3days, started tapering dose 10 mg every 3 days - Procalcitonin negative, WBC count within normal range - DuoNebs every 6 hours - Continue home bronchodilators and Esbriet 1/10 currently patient is on 5 L oxygen via nasal cannula, feels improvement.   Coronary artery disease due to lipid rich plaque No chest pain at this time. -Continue home Coreg, Plavix and atorvastatin   Pulmonary nodule, incidental finding CTA 11 mm apical left upper lobe nodule, possibly new from 07/08/2021 and clearly new from 12/02/2019, worrisome for bronchogenic carcinoma. Consider PET in further evaluation, as clinically indicated. Patient was recommended to follow-up with PCP and pulmonologist as an outpatient.  Body mass index is 23.24 kg/m.  Interventions:    Diet: Regular diet DVT  Prophylaxis: Subcutaneous Lovenox   Advance goals of care discussion: Full code  Family Communication: family was present at bedside, at the time of interview.  The pt provided permission to discuss medical plan with the family. Opportunity was given to ask question and all questions were answered satisfactorily.   Disposition:  Pt is from Home, admitted with Resp Failure, still on 5L HFNC, which precludes a safe discharge. Discharge to Home, most likely tomorrow a.m. if remains stable.    Subjective: No significant overnight events, patient feels improvement in the shortness of breath, has mild cough.  Denies any chest pain or palpitations.  Patient is only using 5 L oxygen via nasal cannula, we will gradually wean down to his baseline and plan for discharge tomorrow a.m.   Physical Exam: General: NAD, lying comfortably, mild SOB Appear in no distress, affect appropriate Eyes: PERRLA ENT: Oral Mucosa Clear, moist  Neck: no JVD,  Cardiovascular: S1 and S2 Present, no Murmur,  Respiratory: good respiratory effort, Bilateral Air entry equal and Decreased, mild bilateral crackles, mild wheezes b/l Abdomen: Bowel Sound present, Soft and no tenderness,  Skin: no rashes Extremities: no Pedal edema, no calf tenderness Neurologic: without any new focal findings Gait not checked due to patient safety concerns  Vitals:   10/29/22 0500 10/29/22 0751 10/29/22 1129 10/29/22 1332  BP: 139/77 (!) 145/84 131/73   Pulse: 62 66 65   Resp: '18 16 20   '$ Temp: 98 F (36.7 C) 98.1 F (36.7 C) 98 F (36.7 C)   TempSrc: Oral Oral Oral  SpO2: 96% 94% 95% 95%  Weight:      Height:        Intake/Output Summary (Last 24 hours) at 10/29/2022 1526 Last data filed at 10/29/2022 1239 Gross per 24 hour  Intake 3 ml  Output 1725 ml  Net -1722 ml   Filed Weights   10/24/22 1300 10/25/22 2036  Weight: 82.1 kg 84.6 kg    Data Reviewed: I have personally reviewed and interpreted daily labs, tele  strips, imagings as discussed above. I reviewed all nursing notes, pharmacy notes, vitals, pertinent old records I have discussed plan of care as described above with RN and patient/family.  CBC: Recent Labs  Lab 10/25/22 0300 10/26/22 0637 10/27/22 0511 10/28/22 0622 10/29/22 0617  WBC 6.6 7.2 7.4 7.5 8.6  HGB 13.7 13.6 13.1 13.4 14.0  HCT 40.7 40.3 38.9* 40.4 41.9  MCV 99.5 99.8 100.8* 100.2* 99.1  PLT 137* 139* 137* 151 709   Basic Metabolic Panel: Recent Labs  Lab 10/25/22 0300 10/26/22 0637 10/27/22 0511 10/28/22 0622 10/29/22 0617  NA 136 138 138 138 137  K 3.9 3.8 3.7 3.8 4.0  CL 103 106 103 105 103  CO2 '27 27 29 29 27  '$ GLUCOSE 154* 107* 100* 91 96  BUN '18 16 15 13 14  '$ CREATININE 0.72 0.61 0.60* 0.60* 0.65  CALCIUM 8.6* 8.4* 8.3* 8.3* 8.5*  MG 1.9 2.2 2.1 2.1  --   PHOS 3.7 3.4 2.6 3.2  --     Studies: No results found.  Scheduled Meds:  atorvastatin  40 mg Oral QPM   carvedilol  3.125 mg Oral BID WC   clopidogrel  37.5 mg Oral QODAY   enoxaparin (LOVENOX) injection  40 mg Subcutaneous Q24H   fluticasone furoate-vilanterol  1 puff Inhalation Daily   And   umeclidinium bromide  1 puff Inhalation Daily   ipratropium-albuterol  3 mL Nebulization Q6H   multivitamin with minerals  1 tablet Oral Daily   Pirfenidone  3 capsule Oral TID   predniSONE  50 mg Oral Q breakfast   Followed by   Derrill Memo ON 10/31/2022] predniSONE  40 mg Oral Q breakfast   Followed by   Derrill Memo ON 11/03/2022] predniSONE  30 mg Oral Q breakfast   Followed by   Derrill Memo ON 11/06/2022] predniSONE  20 mg Oral Q breakfast   Followed by   Derrill Memo ON 11/09/2022] predniSONE  10 mg Oral Q breakfast   sodium chloride flush  3 mL Intravenous Q12H   Vitamin D (Ergocalciferol)  50,000 Units Oral Q7 days   Continuous Infusions: PRN Meds: acetaminophen **OR** acetaminophen, ondansetron **OR** ondansetron (ZOFRAN) IV, oxymetazoline  Time spent: 35 minutes  Author: Val Riles. MD [facility] 10/29/2022 3:26 PM  To reach On-call, see care teams to locate the attending and reach out to them via [email or web address]. If 7PM-7AM, please contact night-coverage If you still have difficulty reaching the attending provider, please page the [facility] (Director on Call) for [facility] on amion for assistance.

## 2022-10-29 NOTE — Progress Notes (Signed)
Mobility Specialist - Progress Note   10/29/22 1351  Mobility  Activity Ambulated independently in hallway  Level of Assistance Independent  Assistive Device None  Distance Ambulated (ft) 480 ft  Activity Response Tolerated well  Mobility Referral Yes  $Mobility charge 1 Mobility   Pt OOB at sink on 5L upon arrival. Pt ambulates 3 laps around NS indep with no LOB noted. Pt desats to 80 SPO2 at end of ambulation. RN notified. SPO2 up to 86 within 1 minute of sitting EOB. Pt left EOB with needs in reach.   Gretchen Short  Mobility Specialist  10/29/22 1:53 PM

## 2022-10-29 NOTE — Plan of Care (Signed)

## 2022-10-29 NOTE — TOC Progression Note (Signed)
Transition of Care [facility]) - Progression Note    Patient Details  Name: Dillon [name] MRN: [id number] Date of Birth: 04/22/1956  [facility]) CM/SW Contact  Laurena Slimmer, RN Phone Number: 10/29/2022, 2:57 PM  Clinical Narrative:    Case reviewed for discharge readiness and needs.         Expected Discharge Plan and Services                                               Social Determinants of Health (SDOH) Interventions SDOH Screenings   Food Insecurity: No Food Insecurity (10/25/2022)  Housing: Low Risk  (10/25/2022)  Transportation Needs: No Transportation Needs (10/25/2022)  Utilities: Not At Risk (10/25/2022)  Depression (PHQ2-9): Low Risk  (12/23/2021)  Tobacco Use: Medium Risk (10/26/2022)    Readmission Risk Interventions     No data to display

## 2022-10-29 NOTE — Progress Notes (Signed)
Mobility Specialist - Progress Note   10/29/22 0830  Mobility  Activity Ambulated independently in hallway  Level of Assistance Independent  Assistive Device None  Distance Ambulated (ft) 360 ft  Activity Response Tolerated well  Mobility Referral Yes  $Mobility charge 1 Mobility   Pt sitting in recliner on 5L upon arrival. Pt STS and ambulates in hallway indep with no LOB noted. Pt returns to recliner with needs in reach.   Gretchen Short  Mobility Specialist  10/29/22 8:31 AM

## 2022-10-30 LAB — BASIC METABOLIC PANEL
Anion gap: 6 (ref 5–15)
BUN: 15 mg/dL (ref 8–23)
CO2: 28 mmol/L (ref 22–32)
Calcium: 8.7 mg/dL — ABNORMAL LOW (ref 8.9–10.3)
Chloride: 102 mmol/L (ref 98–111)
Creatinine, Ser: 0.71 mg/dL (ref 0.61–1.24)
GFR, Estimated: >60 mL/min (ref >60)
Glucose, Bld: 99 mg/dL (ref 70–99)
Potassium: 4.3 mmol/L (ref 3.5–5.1)
Sodium: 136 mmol/L (ref 135–145)

## 2022-10-30 LAB — CBC
HCT: 40 % (ref 39.0–52.0)
Hemoglobin: 13.6 g/dL (ref 13.0–17.0)
MCH: 33.8 pg (ref 26.0–34.0)
MCHC: 34 g/dL (ref 30.0–36.0)
MCV: 99.5 fL (ref 80.0–100.0)
Platelets: 188 10*3/uL (ref 150–400)
RBC: 4.02 MIL/uL — ABNORMAL LOW (ref 4.22–5.81)
RDW: 13.4 % (ref 11.5–15.5)
WBC: 9.1 10*3/uL (ref 4.0–10.5)
nRBC: 0 % (ref 0.0–0.2)

## 2022-10-30 MED ORDER — PREDNISONE 20 MG PO TABS: ORAL_TABLET | ORAL | 0 refills | Status: AC

## 2022-10-30 MED ORDER — VITAMIN D (ERGOCALCIFEROL) 1.25 MG (50000 UNIT) PO CAPS: 50000.0000 [IU] | ORAL_CAPSULE | ORAL | 0 refills | Status: AC

## 2022-10-30 MED ORDER — OXYMETAZOLINE HCL 0.05 % NA SOLN: 1.0000 | Freq: Two times a day (BID) | NASAL | 0 refills | Status: DC | PRN

## 2022-10-30 NOTE — Plan of Care (Signed)
  Problem: Education: Goal: Knowledge of General Education information will improve Description: Including pain rating scale, medication(s)/side effects and non-pharmacologic comfort measures 10/30/2022 1410 by Shauna Hugh, RN Outcome: Adequate for Discharge 10/30/2022 0800 by Shauna Hugh, RN Outcome: Progressing   Problem: Health Behavior/Discharge Planning: Goal: Ability to manage health-related needs will improve 10/30/2022 1410 by Shauna Hugh, RN Outcome: Adequate for Discharge 10/30/2022 0800 by Shauna Hugh, RN Outcome: Progressing   Problem: Clinical Measurements: Goal: Ability to maintain clinical measurements within normal limits will improve 10/30/2022 1410 by Shauna Hugh, RN Outcome: Adequate for Discharge 10/30/2022 0800 by Shauna Hugh, RN Outcome: Progressing Goal: Will remain free from infection 10/30/2022 1410 by Shauna Hugh, RN Outcome: Adequate for Discharge 10/30/2022 0800 by Shauna Hugh, RN Outcome: Progressing Goal: Diagnostic test results will improve 10/30/2022 1410 by Shauna Hugh, RN Outcome: Adequate for Discharge 10/30/2022 0800 by Shauna Hugh, RN Outcome: Progressing Goal: Respiratory complications will improve 10/30/2022 1410 by Shauna Hugh, RN Outcome: Adequate for Discharge 10/30/2022 0800 by Shauna Hugh, RN Outcome: Progressing Goal: Cardiovascular complication will be avoided 10/30/2022 1410 by Shauna Hugh, RN Outcome: Adequate for Discharge 10/30/2022 0800 by Shauna Hugh, RN Outcome: Progressing   Problem: Activity: Goal: Risk for activity intolerance will decrease 10/30/2022 1410 by Shauna Hugh, RN Outcome: Adequate for Discharge 10/30/2022 0800 by Shauna Hugh, RN Outcome: Progressing   Problem: Nutrition: Goal: Adequate nutrition will be maintained 10/30/2022 1410 by Shauna Hugh, RN Outcome: Adequate for Discharge 10/30/2022 0800 by Shauna Hugh, RN Outcome: Progressing   Problem: Coping: Goal: Level of anxiety  will decrease 10/30/2022 1410 by Shauna Hugh, RN Outcome: Adequate for Discharge 10/30/2022 0800 by Shauna Hugh, RN Outcome: Progressing   Problem: Elimination: Goal: Will not experience complications related to bowel motility 10/30/2022 1410 by Shauna Hugh, RN Outcome: Adequate for Discharge 10/30/2022 0800 by Shauna Hugh, RN Outcome: Progressing Goal: Will not experience complications related to urinary retention 10/30/2022 1410 by Shauna Hugh, RN Outcome: Adequate for Discharge 10/30/2022 0800 by Shauna Hugh, RN Outcome: Progressing   Problem: Pain Managment: Goal: General experience of comfort will improve 10/30/2022 1410 by Shauna Hugh, RN Outcome: Adequate for Discharge 10/30/2022 0800 by Shauna Hugh, RN Outcome: Progressing   Problem: Safety: Goal: Ability to remain free from injury will improve 10/30/2022 1410 by Shauna Hugh, RN Outcome: Adequate for Discharge 10/30/2022 0800 by Shauna Hugh, RN Outcome: Progressing   Problem: Skin Integrity: Goal: Risk for impaired skin integrity will decrease 10/30/2022 1410 by Shauna Hugh, RN Outcome: Adequate for Discharge 10/30/2022 0800 by Shauna Hugh, RN Outcome: Progressing

## 2022-10-30 NOTE — Plan of Care (Signed)

## 2022-10-30 NOTE — Progress Notes (Signed)
Mobility Specialist - Progress Note   10/30/22 0819  Mobility  Activity Ambulated independently in hallway  Level of Assistance Independent  Assistive Device None  Distance Ambulated (ft) 720 ft  Activity Response Tolerated well  Mobility Referral Yes  $Mobility charge 1 Mobility   Pt sitting in recliner on 5L upon arrival. Pt STS and ambulates in hallway indep. Pt returns to recliner with needs in reach.   Gretchen Short  Mobility Specialist  10/30/22 8:20 AM

## 2022-10-30 NOTE — Discharge Summary (Signed)
[facility] Discharge Summary   Patient: Dillon [name] HCW:[id number]  PCP: [name], MD  Date of admission: 10/24/2022   Date of discharge:  10/30/2022     Discharge Diagnoses:  Principal Problem:   Acute on chronic hypoxic respiratory failure ([place]) Active Problems:   Coronary artery disease due to lipid rich plaque   Admitted From: Home Disposition:  Home   Recommendations for Outpatient Follow-up:  Follow-up with PCP in 1 week, started on tapering dose prednisone, continue to monitor oxygen level at home and gradually wean down to his baseline 2 to 4 L currently patient is on 5 L oxygen at rest. Follow with pulmonologist in 1 week tees and PET scan for further workup of pulmonary nodule as an outpatient Follow up LABS/TEST:  as above   Follow-up Information     Erby Pian, MD Follow up.   Specialty: Specialist Contact information: [place] [id number] [phone]                Diet recommendation: Cardiac diet  Activity: The patient is advised to gradually reintroduce usual activities, as tolerated  Discharge Condition: stable  Code Status: Full code   History of present illness: As per the H and P dictated on admission Hospital Course:  Dillon [name] is a 67 y.o. 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  ED workup RSV positive, elevated D-dimer, CTA negative for PE. Patient was placed on 10 L oxygen via high flow nasal cannula, admitted for further management as below.  Assessment and Plan: Acute on chronic hypoxic respiratory failure due to RSV virus infection causing worsening of COPD exacerbation with underlying pulmonary fibrosis. Patient has a history of IPF and COPD on 2 to 4 L supplemental oxygen at home and is currently presenting with 2-day history of  shortness of breath in the setting of RSV. Given some wheezing on examination, treated for COPD exacerbation.  Continue supplemental oxygen to maintain oxygen saturation above 88%, Wean as tolerated, currently patient is on 5, oxygen, titrated down from 10 L on admission.  S/p Solu-Medrol 125 mg once, On 1/6 started high-dose prednisone 60 mg pod x 3days, started tapering dose 10 mg every 3 days. Procalcitonin negative, WBC count within normal range, s/p DuoNebs every 6 hours, Continue home bronchodilators and Esbriet.  Patient's condition is gradually improving, agreed with the discharge planning.  Patient's wife was very anxious and aggressive as his O2 saturation was dropping in the 80s on exertion with immediate recovery on 5 L.  Patient was comfortable, denied any symptoms.  Patient was given option to stay in the hospital if he does not feel comfortable going home but he decided to go home and finally patient's wife also agreed with the discharge planning.  Patient's wife was frustrated with his chronic condition, pulmonary fibrosis which is gradually getting worse and there is no cure, she is following pulmonologist as an outpatient but she is not happy with the communication and she is not distending the course of disease and long-term poor prognosis.  I tried my best to counsel her and recommended to follow-up with PCP and pulmonologist as an outpatient.  Patient was discharged on prednisone tapering dose.   # Coronary artery disease due to lipid rich plaque No chest pain at this time. continue  home Coreg, Plavix and atorvastatin # Pulmonary nodule, incidental finding. CTA 11 mm apical left upper lobe nodule, possibly new from 07/08/2021 and clearly new from 12/02/2019, worrisome for bronchogenic carcinoma. Consider PET in further evaluation, as clinically indicated. Patient was recommended to follow-up with PCP and pulmonologist as an outpatient.  Body mass index is 23.93 kg/m.  Nutrition  Interventions:    Patient was ambulatory without any assistance. On the day of the discharge the patient's vitals were stable, and no other acute medical condition were reported by patient. the patient was felt safe to be discharge at Home.  Consultants: None Procedures: None  Discharge Exam: General: Appear in no distress, no Rash; Oral Mucosa Clear, moist. Cardiovascular: S1 and S2 Present, no Murmur, Respiratory: normal respiratory effort, Bilateral Air entry present and mild Crackles, no wheezes Abdomen: Bowel Sound present, Soft and no tenderness, no hernia Extremities: no Pedal edema, no calf tenderness Neurology: alert and oriented to time, place, and person affect appropriate.  Filed Weights   10/24/22 1300 10/25/22 2036  Weight: 82.1 kg 84.6 kg   Vitals:   10/30/22 1000 10/30/22 1050  BP:    Pulse:    Resp:    Temp:    SpO2: (!) 80% 94%    DISCHARGE MEDICATION: Allergies as of 10/30/2022       Reactions   Ace Inhibitors Swelling   Causes cough and swelling        Medication List     STOP taking these medications    aspirin EC 81 MG tablet   doxycycline 100 MG capsule Commonly known as: VIBRAMYCIN   enalapril 5 MG tablet Commonly known as: VASOTEC       TAKE these medications    acetaminophen 500 MG tablet Commonly known as: TYLENOL Take 1,000 mg by mouth every 8 (eight) hours as needed for moderate pain.   albuterol (2.5 MG/3ML) 0.083% nebulizer solution Commonly known as: PROVENTIL Take 2.5 mg by nebulization every 6 (six) hours as needed for wheezing.   ALPHA LIPOIC ACID PO Take 500-1,000 mg by mouth daily.   atorvastatin 40 MG tablet Commonly known as: LIPITOR TAKE 1 TABLET BY MOUTH EVERY DAY   carvedilol 3.125 MG tablet Commonly known as: COREG TAKE 1 TABLET BY MOUTH TWICE A DAY WITH A MEAL What changed: See the new instructions.   clopidogrel 75 MG tablet Commonly known as: PLAVIX Take 37.5 mg by mouth every other day.    Esbriet 267 MG Caps Generic drug: Pirfenidone Take 3 capsules by mouth 3 (three) times daily.   Fluticasone-Umeclidin-Vilant 100-62.5-25 MCG/ACT Aepb Commonly known as: Trelegy Ellipta Inhale 1 puff into the lungs daily.   nitroGLYCERIN 0.4 MG SL tablet Commonly known as: NITROSTAT Place 1 tablet (0.4 mg total) under the tongue every 5 (five) minutes x 3 doses as needed for chest pain.   OXYGEN Inhale 4 L into the lungs continuous.   oxymetazoline 0.05 % nasal spray Commonly known as: AFRIN Place 1 spray into both nostrils 2 (two) times daily as needed for congestion.   predniSONE 20 MG tablet Commonly known as: DELTASONE Take 1 tablet (20 mg total) by mouth daily with breakfast for 3 days, THEN 1.5 tablets (30 mg total) daily with breakfast for 3 days, THEN 1 tablet (20 mg total) daily with breakfast for 3 days, THEN 0.5 tablets (10 mg total) daily with breakfast for 3 days. Start taking on: October 30, 2022 What changed:  medication strength See the new instructions.  triamcinolone ointment 0.1 % Commonly known as: KENALOG Apply 1 Application topically 2 (two) times daily. To itchy areas at neck and body for up to 2 weeks. Avoid applying to face, groin, and axilla. Use as directed. Long-term use can cause thinning of the skin.   Vitamin D (Ergocalciferol) 1.25 MG (50000 UNIT) Caps capsule Commonly known as: DRISDOL Take 1 capsule (50,000 Units total) by mouth every 7 (seven) days. Start taking on: November 01, 2022       Allergies  Allergen Reactions   Ace Inhibitors Swelling    Causes cough and swelling   Discharge Instructions     Call MD for:  difficulty breathing, headache or visual disturbances   Complete by: As directed    Call MD for:  extreme fatigue   Complete by: As directed    Call MD for:  persistant dizziness or light-headedness   Complete by: As directed    Call MD for:  temperature >100.4   Complete by: As directed    Diet - low sodium heart  healthy   Complete by: As directed    Discharge instructions   Complete by: As directed    Follow-up with PCP in 1 week, started on tapering dose prednisone, continue to monitor oxygen level at home and gradually wean down to his baseline 2 to 4 L currently patient is on 5 L oxygen at rest. Follow with pulmonologist in 1 week tees and PET scan for further workup of pulmonary nodule as an outpatient   Increase activity slowly   Complete by: As directed        The results of significant diagnostics from this hospitalization (including imaging, microbiology, ancillary and laboratory) are listed below for reference.    Significant Diagnostic Studies: CT Angio Chest PE W and/or Wo Contrast  Result Date: 10/24/2022 CLINICAL DATA:  Increased shortness of breath.  Elevated D-dimer. EXAM: CT ANGIOGRAPHY CHEST WITH CONTRAST TECHNIQUE: Multidetector CT imaging of the chest was performed using the standard protocol during bolus administration of intravenous contrast. Multiplanar CT image reconstructions and MIPs were obtained to evaluate the vascular anatomy. RADIATION DOSE REDUCTION: This exam was performed according to the departmental dose-optimization program which includes automated exposure control, adjustment of the mA and/or kV according to patient size and/or use of iterative reconstruction technique. CONTRAST:  20m OMNIPAQUE IOHEXOL 350 MG/ML SOLN COMPARISON:  07/08/2021 and 12/02/2019. FINDINGS: Cardiovascular: Negative for pulmonary embolus. Coronary artery calcification. Enlarged pulmonic trunk and heart. No pericardial effusion. Mediastinum/Nodes: Mediastinal lymph nodes measure up to 13 mm in the prevascular space, stable from 07/08/2021. Hilar lymph nodes measure up to 1.3 cm on the right, also similar. No axillary adenopathy. Esophagus is grossly unremarkable. Lungs/Pleura: Centrilobular and paraseptal emphysema. Superimposed subpleural reticulation, ground-glass and possible honeycombing.  Nodular lesion in the apical left upper lobe measures 9 x 14 mm (5/40), possibly new from 07/07/2021 and clearly new from 12/02/2019. Mild basilar septal thickening. No pleural fluid. Airway is unremarkable. Upper Abdomen: Visualized portions of the liver, gallbladder, adrenal gland, kidneys, spleen, pancreas, stomach and bowel are grossly unremarkable. No upper abdominal adenopathy. Musculoskeletal: Degenerative changes in the spine. No worrisome lytic or sclerotic lesions. Review of the MIP images confirms the above findings. IMPRESSION: 1. Negative for pulmonary embolus. 2. Question mild pulmonary edema. 3. 11 mm apical left upper lobe nodule, possibly new from 07/08/2021 and clearly new from 12/02/2019, worrisome for bronchogenic carcinoma. Consider PET in further evaluation, as clinically indicated. 4. Pulmonary fibrosis, categorized as usual interstitial pneumonitis  on 12/02/2019. 5. Small to borderline enlarged mediastinal hilar lymph nodes, seen in the setting of interstitial lung disease. 6.  Emphysema (ICD10-J43.9). 7. Coronary artery calcification. 8. Enlarged pulmonic trunk, indicative of pulmonary arterial hypertension. Electronically Signed   By: Lorin Picket M.D.   On: 10/24/2022 11:44   DG Chest 2 View  Result Date: 10/24/2022 CLINICAL DATA:  Shortness of breath EXAM: CHEST - 2 VIEW COMPARISON:  Chest x-rays dated September 30, 2021 and September 22, 2022. FINDINGS: Findings of interstitial lung disease. No definitive acute infiltrate noted. The heart, hila, mediastinum, lungs, and pleura otherwise unremarkable. No pneumothorax. IMPRESSION: Findings of interstitial lung disease. No definitive acute infiltrate. Electronically Signed   By: Dorise Bullion III M.D.   On: 10/24/2022 10:11    Microbiology: Recent Results (from the past 240 hour(s))  Resp panel by RT-PCR (RSV, Flu A&B, Covid) Anterior Nasal Swab     Status: Abnormal   Collection Time: 10/24/22 10:24 AM   Specimen: Anterior Nasal  Swab  Result Value Ref Range Status   SARS Coronavirus 2 by RT PCR NEGATIVE NEGATIVE Final    Comment: (NOTE) SARS-CoV-2 target nucleic acids are NOT DETECTED.  The SARS-CoV-2 RNA is generally detectable in upper respiratory specimens during the acute phase of infection. The lowest concentration of SARS-CoV-2 viral copies this assay can detect is 138 copies/mL. A negative result does not preclude SARS-Cov-2 infection and should not be used as the sole basis for treatment or other patient management decisions. A negative result may occur with  improper specimen collection/handling, submission of specimen other than nasopharyngeal swab, presence of viral mutation(s) within the areas targeted by this assay, and inadequate number of viral copies(<138 copies/mL). A negative result must be combined with clinical observations, patient history, and epidemiological information. The expected result is Negative.  Fact Sheet for Patients:  EntrepreneurPulse.com.au  Fact Sheet for Healthcare Providers:  IncredibleEmployment.be  This test is no t yet approved or cleared by the [place] FDA and  has been authorized for detection and/or diagnosis of SARS-CoV-2 by FDA under an Emergency Use Authorization (EUA). This EUA will remain  in effect (meaning this test can be used) for the duration of the COVID-19 declaration under Section 564(b)(1) of the Act, 21 U.S.C.section 360bbb-3(b)(1), unless the authorization is terminated  or revoked sooner.       Influenza A by PCR NEGATIVE NEGATIVE Final   Influenza B by PCR NEGATIVE NEGATIVE Final    Comment: (NOTE) The Xpert Xpress SARS-CoV-2/FLU/RSV plus assay is intended as an aid in the diagnosis of influenza from Nasopharyngeal swab specimens and should not be used as a sole basis for treatment. Nasal washings and aspirates are unacceptable for Xpert Xpress SARS-CoV-2/FLU/RSV testing.  Fact Sheet for  Patients: EntrepreneurPulse.com.au  Fact Sheet for Healthcare Providers: IncredibleEmployment.be  This test is not yet approved or cleared by the [place] FDA and has been authorized for detection and/or diagnosis of SARS-CoV-2 by FDA under an Emergency Use Authorization (EUA). This EUA will remain in effect (meaning this test can be used) for the duration of the COVID-19 declaration under Section 564(b)(1) of the Act, 21 U.S.C. section 360bbb-3(b)(1), unless the authorization is terminated or revoked.     Resp Syncytial Virus by PCR POSITIVE (A) NEGATIVE Final    Comment: (NOTE) Fact Sheet for Patients: EntrepreneurPulse.com.au  Fact Sheet for Healthcare Providers: IncredibleEmployment.be  This test is not yet approved or cleared by the [place] FDA and has been authorized for detection and/or  diagnosis of SARS-CoV-2 by FDA under an Emergency Use Authorization (EUA). This EUA will remain in effect (meaning this test can be used) for the duration of the COVID-19 declaration under Section 564(b)(1) of the Act, 21 U.S.C. section 360bbb-3(b)(1), unless the authorization is terminated or revoked.  Performed at [facility], [address] [place], [place] [address]      Labs: CBC: Recent Labs  Lab 10/26/22 [phone] 10/27/22 0511 10/28/22 0622 10/29/22 0617 10/30/22 0445  WBC 7.2 7.4 7.5 8.6 9.1  HGB 13.6 13.1 13.4 14.0 13.6  HCT 40.3 38.9* 40.4 41.9 40.0  MCV 99.8 100.8* 100.2* 99.1 99.5  PLT 139* 137* 151 171 157   Basic Metabolic Panel: Recent Labs  Lab 10/25/22 0300 10/26/22 0637 10/27/22 0511 10/28/22 0622 10/29/22 0617 10/30/22 0445  NA 136 138 138 138 137 136  K 3.9 3.8 3.7 3.8 4.0 4.3  CL 103 106 103 105 103 102  CO2 '27 27 29 29 27 28  '$ GLUCOSE 154* 107* 100* 91 96 99  BUN '18 16 15 13 14 15  '$ CREATININE 0.72 0.61 0.60* 0.60* 0.65 0.71  CALCIUM 8.6* 8.4* 8.3* 8.3*  8.5* 8.7*  MG 1.9 2.2 2.1 2.1  --   --   PHOS 3.7 3.4 2.6 3.2  --   --    Liver Function Tests: No results for input(s): "AST", "ALT", "ALKPHOS", "BILITOT", "PROT", "ALBUMIN" in the last 168 hours. No results for input(s): "LIPASE", "AMYLASE" in the last 168 hours. No results for input(s): "AMMONIA" in the last 168 hours. Cardiac Enzymes: No results for input(s): "CKTOTAL", "CKMB", "CKMBINDEX", "TROPONINI" in the last 168 hours. BNP (last 3 results) Recent Labs    10/24/22 1022  BNP 192.2*   CBG: No results for input(s): "GLUCAP" in the last 168 hours.  Time spent: 35 minutes  Signed:  Val Riles  [facility]  10/30/2022 1:32 PM

## 2022-10-30 NOTE — TOC Transition Note (Signed)
Transition of Care [facility]) - CM/SW Discharge Note   Patient Details  Name: Dillon [name] MRN: [id number] Date of Birth: 1956-01-22  [facility] CM/SW Contact:  Tiburcio Bash, LCSW Phone Number: 10/30/2022, 1:51 PM   Clinical Narrative:        Patient to dc home today CSW spoke with spouse who reports they have all dme at home no needs. She reports patient has plenty of oxygen at home originally through [facility] but now through [employer]. She reports she has spoke with [employer] rep who will be out today when patient discharges from the hospital. No other needs noted.      Patient Goals and CMS Choice      Discharge Placement                         Discharge Plan and Services Additional resources added to the After Visit Summary for                                       Social Determinants of Health (SDOH) Interventions SDOH Screenings   Food Insecurity: No Food Insecurity (10/25/2022)  Housing: Low Risk  (10/25/2022)  Transportation Needs: No Transportation Needs (10/25/2022)  Utilities: Not At Risk (10/25/2022)  Depression (PHQ2-9): Low Risk  (12/23/2021)  Tobacco Use: Medium Risk (10/26/2022)     Readmission Risk Interventions     No data to display

## 2022-10-31 ENCOUNTER — Other Ambulatory Visit: Payer: Self-pay | Admitting: Cardiovascular Disease

## 2022-11-10 ENCOUNTER — Other Ambulatory Visit: Payer: Self-pay | Admitting: Specialist

## 2022-11-10 DIAGNOSIS — R911 Solitary pulmonary nodule: Secondary | ICD-10-CM

## 2022-11-11 ENCOUNTER — Ambulatory Visit
Admission: RE | Admit: 2022-11-11 | Discharge: 2022-11-11 | Disposition: A | Discharge status: Home / Self Care | Payer: BC Managed Care – PPO | Source: Ambulatory Visit | Attending: Specialist | Admitting: Specialist

## 2022-11-11 VITALS — Ht 74.0 in | Wt 173.0 lb

## 2022-11-11 DIAGNOSIS — R911 Solitary pulmonary nodule: Secondary | ICD-10-CM | POA: Diagnosis not present

## 2022-11-11 LAB — GLUCOSE, CAPILLARY: Glucose-Capillary: 86 mg/dL (ref 70–99)

## 2022-11-11 MED ORDER — FLUDEOXYGLUCOSE F - 18 (FDG) INJECTION
9.6000 | Freq: Once | INTRAVENOUS | Status: AC | PRN
  Administered 2022-11-11: 9.55 via INTRAVENOUS

## 2022-12-02 ENCOUNTER — Other Ambulatory Visit: Payer: Self-pay | Admitting: Cardiovascular Disease

## 2022-12-02 ENCOUNTER — Other Ambulatory Visit: Payer: Self-pay | Admitting: Dermatology

## 2022-12-02 DIAGNOSIS — I2510 Atherosclerotic heart disease of native coronary artery without angina pectoris: Secondary | ICD-10-CM

## 2022-12-02 DIAGNOSIS — R21 Rash and other nonspecific skin eruption: Secondary | ICD-10-CM

## 2023-01-06 ENCOUNTER — Other Ambulatory Visit: Payer: Self-pay | Admitting: Cardiovascular Disease

## 2023-01-06 NOTE — Telephone Encounter (Signed)
Please advise if ok to refill last filled by PCP. 

## 2023-04-07 ENCOUNTER — Other Ambulatory Visit: Payer: Self-pay | Admitting: Dermatology

## 2023-04-07 DIAGNOSIS — R21 Rash and other nonspecific skin eruption: Secondary | ICD-10-CM

## 2023-04-10 ENCOUNTER — Other Ambulatory Visit: Payer: Self-pay | Admitting: Specialist

## 2023-04-10 DIAGNOSIS — R911 Solitary pulmonary nodule: Secondary | ICD-10-CM

## 2023-06-04 ENCOUNTER — Encounter: Payer: BC Managed Care – PPO | Admitting: Dermatology

## 2023-06-12 ENCOUNTER — Ambulatory Visit: Payer: BC Managed Care – PPO | Admitting: Physician Assistant

## 2023-07-01 ENCOUNTER — Other Ambulatory Visit: Payer: Self-pay | Admitting: Cardiovascular Disease

## 2023-07-05 NOTE — Progress Notes (Unsigned)
Cardiology Office Note    Date:  07/09/2023   ID:  Dillon [name], DOB [birth year], MRN [id number]  PCP:  Corky Downs, MD  Cardiologist:  Lorine Bears, MD  Electrophysiologist:  None   Chief Complaint: Follow up  History of Present Illness:   Dillon [name] is a 67 y.o. male with history of CAD with ST elevation MI in 09/2019 status post PCI/DES to the LAD, chronic hypoxic respiratory failure on 4 L supplemental oxygen with interstitial pneumonitis, 3.5 x 3.3 cm infrarenal AAA by pet imaging in 10/2022, HLD, and prior tobacco use who presents for follow-up of CAD.  He was admitted to the hospital in 09/2019 with an ST elevation MI in the setting of COVID infection.  LHC showed an occluded apical LAD supplying the distal inferior wall, which was the culprit.  There was also 80% eccentric complex plaque rupture in the proximal LAD, which was felt to be responsible for the distal embolization.  RCA stent was patent.  He underwent successful PCI/DES to the proximal LAD.  The flow to the distal LAD was restored just by wiring of the occlusion site.  EF was normal.  He presented to the hospital in 09/2021 with an NSTEMI with a peak troponin of 3800.  LHC showed patent stents in the proximal LAD and distal RCA/RPDA.  No culprit was identified.  LVEDP and EF were normal.  Clopidogrel was resumed, as it was previously stopped a few months prior to his presentation due to recurrent epistaxis.  Echo in 03/2022 showed normal LV systolic function with no significant valvular abnormalities.  He was last seen in the office in 09/2022 and was taking a half dose of clopidogrel every other day.  He was admitted to the hospital in 10/2022 with acute on chronic hypoxic respiratory failure secondary to RSV and COPD exacerbation with underlying pulmonary fibrosis.  Workup was also notable for an incidental finding of pulmonary nodule with subsequent PET scan showing no FDG uptake with the appearance of scarring change  noted.  There are findings included severe lung disease, advanced vascular disease for age, borderline mediastinal and hilar lymph nodes that were not hypermetabolic and likely reactive due to severe underlying lung disease, and a 3.5 x 3.3 cm infrarenal abdominal aortic aneurysm.  He comes in today doing well from a cardiac perspective and is without symptoms of angina or cardiac decompensation.  He does feel like his chronic dyspnea has progressed some.  No chest pain, palpitations, dizziness, presyncope, or syncope.  He continues to take a half dose of clopidogrel every other day and is tolerating this with preference to continue at current dose/regimen.  Previously has had epistaxis cauterized.  No lower extremity swelling, abdominal distention, or progressive orthopnea.  He does note some nausea early in the morning that self resolves.  Remains on baseline 4 L supplemental oxygen.  Does not do strenuous activities such as shoveling.  Has BP cuff at home, cannot recall recent readings.   Labs independently reviewed: 10/2022 - Hgb 13.6, PLT 188, potassium 4.3, BUN 15, serum creatinine 0.71, magnesium 2.1 09/2022 - albumin 3.5, AST/ALT normal 05/2022 - TSH normal, TC 126, TG 121, HDL 43, LDL 59  Past Medical History:  Diagnosis Date   Coronary artery disease    Elevated cholesterol    Myocardial infarction (HCC)    Pulmonary fibrosis (HCC)    Restless leg syndrome     Past Surgical History:  Procedure Laterality Date   ANGIOPLASTY  2010   2 stents placed   BACK SURGERY     cardiac stents     x2   COLONOSCOPY     COLONOSCOPY WITH PROPOFOL N/A 03/05/2018   Procedure: COLONOSCOPY WITH PROPOFOL;  Surgeon: Pasty Spillers, MD;  Location: [facility] ENDOSCOPY;  Service: Endoscopy;  Laterality: N/A;   COLONOSCOPY WITH PROPOFOL N/A 01/24/2022   Procedure: COLONOSCOPY WITH PROPOFOL;  Surgeon: Wyline Mood, MD;  Location: [facility] ENDOSCOPY;  Service: Gastroenterology;  Laterality: N/A;   CORONARY  ANGIOPLASTY     CORONARY/GRAFT ACUTE MI REVASCULARIZATION N/A 09/30/2019   Procedure: Coronary/Graft Acute MI Revascularization;  Surgeon: Iran Ouch, MD;  Location: [facility] INVASIVE CV LAB;  Service: Cardiovascular;  Laterality: N/A;   LEFT HEART CATH AND CORONARY ANGIOGRAPHY N/A 09/30/2019   Procedure: LEFT HEART CATH AND CORONARY ANGIOGRAPHY;  Surgeon: Iran Ouch, MD;  Location: [facility] INVASIVE CV LAB;  Service: Cardiovascular;  Laterality: N/A;   LEFT HEART CATH AND CORS/GRAFTS ANGIOGRAPHY N/A 10/01/2021   Procedure: LEFT HEART CATH AND CORS/GRAFTS ANGIOGRAPHY;  Surgeon: Yvonne Kendall, MD;  Location: [facility] INVASIVE CV LAB;  Service: Cardiovascular;  Laterality: N/A;    Current Medications: Current Meds  Medication Sig   acetaminophen (TYLENOL) 500 MG tablet Take 1,000 mg by mouth every 8 (eight) hours as needed for moderate pain.   albuterol (PROVENTIL) (2.5 MG/3ML) 0.083% nebulizer solution Take 2.5 mg by nebulization every 6 (six) hours as needed for wheezing.   ALPHA LIPOIC ACID PO Take 500-1,000 mg by mouth daily.   carvedilol (COREG) 3.125 MG tablet TAKE 1 TABLET BY MOUTH TWICE A DAY WITH FOOD   clopidogrel (PLAVIX) 75 MG tablet TAKE 1/2 TABLET (37.5 MG TOTAL) BY MOUTH EVERY OTHER DAY.   Fluticasone-Umeclidin-Vilant (TRELEGY ELLIPTA) 100-62.5-25 MCG/ACT AEPB Inhale 1 puff into the lungs daily.   nitroGLYCERIN (NITROSTAT) 0.4 MG SL tablet Place 1 tablet (0.4 mg total) under the tongue every 5 (five) minutes x 3 doses as needed for chest pain.   OXYGEN Inhale 4 L into the lungs continuous.   oxymetazoline (AFRIN) 0.05 % nasal spray Place 1 spray into both nostrils 2 (two) times daily as needed for congestion.   pantoprazole (PROTONIX) 40 MG tablet Take 40 mg by mouth daily.   Pirfenidone (ESBRIET) 267 MG CAPS Take 3 capsules by mouth 3 (three) times daily.   predniSONE (DELTASONE) 10 MG tablet Take 10 mg by mouth daily.   roflumilast (DALIRESP) 500 MCG TABS tablet Take 1  tablet by mouth daily.   triamcinolone ointment (KENALOG) 0.1 % APPLY TO ITCHY AREAS AT NECK AND BODY TWICE A DAY FOR UP TO 2 WEEKS. AVOID APPLYING TO FACE, GROIN, AND AXILLA. USE AS DIRECTED. LONG-TERM USE CAN CAUSE THINNING OF THE SKIN.   [DISCONTINUED] atorvastatin (LIPITOR) 40 MG tablet TAKE 1 TABLET BY MOUTH EVERY DAY    Allergies:   Ace inhibitors   Social History   Socioeconomic History   Marital status: Married    Spouse name: Not on file   Number of children: Not on file   Years of education: Not on file   Highest education level: Not on file  Occupational History   Not on file  Tobacco Use   Smoking status: Former    Current packs/day: 0.00    Types: Cigarettes    Quit date: 03/05/2009    Years since quitting: 14.3   Smokeless tobacco: Never  Vaping Use   Vaping status: Never Used  Substance and Sexual Activity   Alcohol  use: Not Currently    Comment: occassionally - 1 beer/month or less   Drug use: Not Currently   Sexual activity: Not Currently  Other Topics Concern   Not on file  Social History Narrative   Lives locally with wife.  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   Social Determinants of Health   Financial Resource Strain: Not on file  Food Insecurity: No Food Insecurity (10/25/2022)   Hunger Vital Sign    Worried About Running Out of Food in the Last Year: Never true    Ran Out of Food in the Last Year: Never true  Transportation Needs: No Transportation Needs (10/25/2022)   PRAPARE - Administrator, Civil Service (Medical): No    Lack of Transportation (Non-Medical): No  Physical Activity: Not on file  Stress: Not on file  Social Connections: Not on file     Family History:  The patient's family history includes Heart disease in his father and mother.  ROS:   12-point review of systems is negative unless otherwise noted in the HPI.   EKGs/Labs/Other Studies Reviewed:    Studies reviewed were  summarized above. The additional studies were reviewed today:  2D echo 04/16/2022: 1. Left ventricular ejection fraction, by estimation, is 55 to 60%. The  left ventricle has normal function. The left ventricle has no regional  wall motion abnormalities. Left ventricular diastolic parameters are  consistent with Grade I diastolic  dysfunction (impaired relaxation).   2. Right ventricular systolic function is normal. The right ventricular  size is normal.   3. The mitral valve is normal in structure. No evidence of mitral valve  regurgitation.   4. The aortic valve is tricuspid. Aortic valve regurgitation is not  visualized.   5. The inferior vena cava is normal in size with greater than 50%  respiratory variability, suggesting right atrial pressure of 3 mmHg.  __________  LHC 10/01/2021: Conclusions: Nonobstructive coronary artery disease with patent stents in the proximal LAD and distal RCA/RPDA.  No culprit lesion identified to explain patient's chest pain and elevated troponin (MINOCA). Preserved left ventricular systolic function with focal apical akinesis.  LVEF 55-65% with normal filling pressure. Tortuous right radial artery with vasospasm precluding sheath placement.  Consider alternative access for further catheterizations.   Recommendations: Dual antiplatelet therapy with aspirin and clopidogrel for up to 12 months, as tolerated (patient previously had to discontinue clopidogrel due to epistaxis). Aggressive secondary prevention of coronary artery disease. Escalate antianginal therapy with addition of isosorbide mononitrate 15 mg daily. Anticipate discharge home tomorrow if no further chest pain or post catheterization complications. ____________  LHC 09/30/2019: Mid Cx lesion is 30% stenosed. Prox LAD lesion is 80% stenosed. Post intervention, there is a 0% residual stenosis. A drug-eluting stent was successfully placed using a STENT RESOLUTE ONYX 3.5X12. 1st Diag lesion  is 40% stenosed. RPDA lesion is 10% stenosed. Dist RCA lesion is 10% stenosed. Prox RCA to Mid RCA lesion is 30% stenosed. The left ventricular systolic function is normal. LV end diastolic pressure is normal. Dist LAD lesion is 100% stenosed.   1.  Significant underlying two-vessel coronary artery disease with patent distal RCA stent with no significant restenosis.  Occluded apical LAD is likely the culprit for ST elevation myocardial infarction.  In addition, there is an 80% eccentric complex plaque in the proximal LAD which likely was responsible for distal embolization. 2.  Normal LV systolic function with severe apical hypokinesis.  LVEDP  was normal at 10 mmHg. 3.  Successful direct stenting of the proximal LAD and establishing flow in the apical LAD just by wiring the lesion and giving antiplatelet and anticoagulant medications.   Recommendations: Continue dual antiplatelet therapy with aspirin and ticagrelor for at least 1 year. Aggressive treatment of risk factors. Lovenox for DVT prophylaxis. Treatment of Covid per internal medicine.   EKG:  EKG is ordered today.  The EKG ordered today demonstrates NSR, 67 bpm, incomplete RBBB, baseline wandering and artifact, no acute ST-T changes, consistent with prior tracing  Recent Labs: 10/24/2022: B Natriuretic Peptide 192.2 10/28/2022: Magnesium 2.1 10/30/2022: BUN 15; Creatinine, Ser 0.71; Hemoglobin 13.6; Platelets 188; Potassium 4.3; Sodium 136  Recent Lipid Panel    Component Value Date/Time   CHOL 118 10/01/2021 0422   TRIG 45 10/01/2021 0422   HDL 45 10/01/2021 0422   CHOLHDL 2.6 10/01/2021 0422   VLDL 9 10/01/2021 0422   LDLCALC 64 10/01/2021 0422   LDLCALC 63 08/31/2020 1433    PHYSICAL EXAM:    VS:  BP (!) 100/50 (BP Location: Left Arm, Patient Position: Sitting, Cuff Size: Normal)   Pulse 67   Ht 6\' 2"  (1.88 m)   Wt 174 lb 4 oz (79 kg)   SpO2 93% Comment: 4 Liters of O2  BMI 22.37 kg/m   BMI: Body mass index is 22.37  kg/m.  Physical Exam Vitals reviewed.  Constitutional:      Appearance: He is well-developed.  HENT:     Head: Normocephalic and atraumatic.  Eyes:     General:        Right eye: No discharge.        Left eye: No discharge.  Cardiovascular:     Rate and Rhythm: Normal rate and regular rhythm.     Pulses:          Posterior tibial pulses are 2+ on the right side and 2+ on the left side.     Heart sounds: Normal heart sounds, S1 normal and S2 normal. Heart sounds not distant. No midsystolic click and no opening snap. No murmur heard.    No friction rub.  Pulmonary:     Effort: Pulmonary effort is normal. No respiratory distress.     Breath sounds: Normal breath sounds. No decreased breath sounds, wheezing or rales.  Chest:     Chest wall: No tenderness.  Abdominal:     General: There is no distension.     Palpations: Abdomen is soft.     Tenderness: There is no abdominal tenderness.  Musculoskeletal:     Cervical back: Normal range of motion.     Right lower leg: No edema.     Left lower leg: No edema.  Skin:    General: Skin is warm and dry.     Nails: There is no clubbing.  Neurological:     Mental Status: He is alert and oriented to person, place, and time.  Psychiatric:        Speech: Speech normal.        Behavior: Behavior normal.        Thought Content: Thought content normal.        Judgment: Judgment normal.     Wt Readings from Last 3 Encounters:  07/09/23 174 lb 4 oz (79 kg)  11/11/22 173 lb (78.5 kg)  10/25/22 186 lb 6.4 oz (84.6 kg)     ASSESSMENT & PLAN:   CAD involving the native coronary arteries without angina: He  is without symptoms of angina or cardiac decompensation.  Continue aggressive risk factor modification and secondary prevention including half dose clopidogrel every other day at his preference.  No indication for further ischemic testing at this time.  HTN: Blood pressure is on the soft side today, though he is asymptomatic.  He will  check his blood pressure several times per day over the next couple of weeks and let us know readings.  For now, remains on carvedilol 3.125 mg twice daily.  HLD: LDL 59 in 05/2022 with normal AST/ALT in 09/2022.  Update LFT and lipid panel.  Remains on atorvastatin 40 mg.  Infrarenal AAA: Incidentally noted on PET scan in 10/2022, measuring 3.5 x 3.3 cm.  Precautions discussed.  Schedule ultrasound in 10/2023.  Hypoxic respiratory failure with ILD/pulmonary nodule: He does feel like his chronic dyspnea has worsened some.  Recommend ongoing management per pulmonology.  We will obtain an echo to evaluate for new cardiomyopathy or evidence of pulmonary hypertension.   Disposition: F/u with Dr. Kirke Corin or an APP in 6 months.   Medication Adjustments/Labs and Tests Ordered: Current medicines are reviewed at length with the patient today.  Concerns regarding medicines are outlined above. Medication changes, Labs and Tests ordered today are summarized above and listed in the Patient Instructions accessible in Encounters.   Signed, Eula Listen, PA-C 07/09/2023 3:39 PM     [facility] [place] [address] [place], [place] [address] [phone]

## 2023-07-09 ENCOUNTER — Encounter: Payer: Self-pay | Admitting: Physician Assistant

## 2023-07-09 ENCOUNTER — Other Ambulatory Visit: Payer: Self-pay | Admitting: Cardiovascular Disease

## 2023-07-09 ENCOUNTER — Ambulatory Visit: Payer: Medicare Other | Attending: Physician Assistant | Admitting: Physician Assistant

## 2023-07-09 VITALS — BP 100/50 | HR 67 | Ht 74.0 in | Wt 174.2 lb

## 2023-07-09 DIAGNOSIS — J9611 Chronic respiratory failure with hypoxia: Secondary | ICD-10-CM

## 2023-07-09 DIAGNOSIS — I2510 Atherosclerotic heart disease of native coronary artery without angina pectoris: Secondary | ICD-10-CM | POA: Diagnosis not present

## 2023-07-09 DIAGNOSIS — I10 Essential (primary) hypertension: Secondary | ICD-10-CM | POA: Diagnosis not present

## 2023-07-09 DIAGNOSIS — I7143 Infrarenal abdominal aortic aneurysm, without rupture: Secondary | ICD-10-CM | POA: Diagnosis not present

## 2023-07-09 DIAGNOSIS — R911 Solitary pulmonary nodule: Secondary | ICD-10-CM

## 2023-07-09 DIAGNOSIS — E785 Hyperlipidemia, unspecified: Secondary | ICD-10-CM

## 2023-07-09 DIAGNOSIS — J849 Interstitial pulmonary disease, unspecified: Secondary | ICD-10-CM

## 2023-07-09 NOTE — Patient Instructions (Signed)
Medication Instructions:  Your Physician recommend you continue on your current medication as directed.    *If you need a refill on your cardiac medications before your next appointment, please call your pharmacy*   Lab Work: Your provider would like for you to have following labs drawn today Lipid Panel and Liver Function.   If you have labs (blood work) drawn today and your tests are completely normal, you will receive your results only by: MyChart Message (if you have MyChart) OR A paper copy in the mail If you have any lab test that is abnormal or we need to change your treatment, we will call you to review the results.   Testing/Procedures:  Your physician has requested that you have an echocardiogram. Echocardiography is a painless test that uses sound waves to create images of your heart. It provides your doctor with information about the size and shape of your heart and how well your heart's chambers and valves are working.   You may receive an ultrasound enhancing agent through an IV if needed to better visualize your heart during the echo. This procedure takes approximately one hour.  There are no restrictions for this procedure.  This will take place at [address] [address], [place] [address]    Your physician has recommended that you have an AORTA/ILIAC Duplex IN MID-JANUARY. This is a noninvasive diagnostic test that uses ultrasound technology to look at the aorta and iliac arteries to detect any restriction to blood flow to the buttocks, groin, legs or feet.  No food after 11PM the night before.  Water is OK. (Don't drink liquids if you have been instructed not to for ANOTHER test). Avoid foods that produce bowel gas, for 24 hours prior to exam (see below). No breakfast, no chewing gum, no smoking or carbonated beverages. Patient may take morning medications with water. Come in for test at least 15 minutes early to register. This will take  approximately 60 minutes This will take place at [address] ([facility], [place] [address]    Follow-Up: At [facility], you and your health needs are our priority.  As part of our continuing mission to provide you with exceptional heart care, we have created designated Provider Care Teams.  These Care Teams include your primary Cardiologist (physician) and Advanced Practice Providers (APPs -  Physician Assistants and Nurse Practitioners) who all work together to provide you with the care you need, when you need it.  We recommend signing up for the patient portal called "MyChart".  Sign up information is provided on this After Visit Summary.  MyChart is used to connect with patients for Virtual Visits (Telemedicine).  Patients are able to view lab/test results, encounter notes, upcoming appointments, etc.  Non-urgent messages can be sent to your provider as well.   To learn more about what you can do with MyChart, go to [email or web address].    Your next appointment:   After your Mid-January Aortic Ultrasound  Provider:   You may see Lorine Bears, MD or one of the following Advanced Practice Providers on your designated Care Team:   Eula Listen, [place]

## 2023-07-10 LAB — LIPID PANEL
Chol/HDL Ratio: 2.7 ratio (ref 0.0–5.0)
Cholesterol, Total: 142 mg/dL (ref 100–199)
HDL: 52 mg/dL (ref >39)
LDL Chol Calc (NIH): 70 mg/dL (ref 0–99)
Triglycerides: 108 mg/dL (ref 0–149)
VLDL Cholesterol Cal: 20 mg/dL (ref 5–40)

## 2023-07-10 LAB — HEPATIC FUNCTION PANEL
ALT: 28 IU/L (ref 0–44)
AST: 19 IU/L (ref 0–40)
Albumin: 3.8 g/dL — ABNORMAL LOW (ref 3.9–4.9)
Alkaline Phosphatase: 81 IU/L (ref 44–121)
Bilirubin Total: 0.4 mg/dL (ref 0.0–1.2)
Bilirubin, Direct: 0.12 mg/dL (ref 0.00–0.40)
Total Protein: 6.5 g/dL (ref 6.0–8.5)

## 2023-07-30 ENCOUNTER — Ambulatory Visit: Payer: Medicare Other | Attending: Physician Assistant

## 2023-07-30 DIAGNOSIS — I5030 Unspecified diastolic (congestive) heart failure: Secondary | ICD-10-CM | POA: Diagnosis not present

## 2023-07-30 DIAGNOSIS — I361 Nonrheumatic tricuspid (valve) insufficiency: Secondary | ICD-10-CM

## 2023-07-30 DIAGNOSIS — J9611 Chronic respiratory failure with hypoxia: Secondary | ICD-10-CM | POA: Diagnosis not present

## 2023-07-30 LAB — ECHOCARDIOGRAM COMPLETE
Area-P 1/2: 2.48 cm2
S' Lateral: 3.1 cm

## 2023-08-12 ENCOUNTER — Encounter: Payer: Self-pay | Admitting: Ophthalmology

## 2023-08-13 ENCOUNTER — Encounter: Payer: Self-pay | Admitting: Ophthalmology

## 2023-08-19 NOTE — Discharge Instructions (Signed)

## 2023-08-20 ENCOUNTER — Ambulatory Visit: Payer: Medicare Other | Admitting: Anesthesiology

## 2023-08-20 ENCOUNTER — Encounter: Payer: Self-pay | Admitting: Ophthalmology

## 2023-08-20 ENCOUNTER — Encounter
Admission: RE | Disposition: A | Discharge status: Home / Self Care | Payer: Self-pay | Source: Home / Self Care | Attending: Ophthalmology

## 2023-08-20 ENCOUNTER — Other Ambulatory Visit: Payer: Self-pay

## 2023-08-20 ENCOUNTER — Ambulatory Visit
Admission: RE | Admit: 2023-08-20 | Discharge: 2023-08-20 | Disposition: A | Discharge status: Home / Self Care | Payer: Medicare Other | Attending: Ophthalmology | Admitting: Ophthalmology

## 2023-08-20 DIAGNOSIS — J8410 Pulmonary fibrosis, unspecified: Secondary | ICD-10-CM | POA: Diagnosis not present

## 2023-08-20 DIAGNOSIS — I252 Old myocardial infarction: Secondary | ICD-10-CM | POA: Insufficient documentation

## 2023-08-20 DIAGNOSIS — J449 Chronic obstructive pulmonary disease, unspecified: Secondary | ICD-10-CM | POA: Insufficient documentation

## 2023-08-20 DIAGNOSIS — Z79899 Other long term (current) drug therapy: Secondary | ICD-10-CM | POA: Insufficient documentation

## 2023-08-20 DIAGNOSIS — R053 Chronic cough: Secondary | ICD-10-CM | POA: Insufficient documentation

## 2023-08-20 DIAGNOSIS — K219 Gastro-esophageal reflux disease without esophagitis: Secondary | ICD-10-CM | POA: Diagnosis not present

## 2023-08-20 DIAGNOSIS — Z9981 Dependence on supplemental oxygen: Secondary | ICD-10-CM | POA: Insufficient documentation

## 2023-08-20 DIAGNOSIS — E7800 Pure hypercholesterolemia, unspecified: Secondary | ICD-10-CM | POA: Insufficient documentation

## 2023-08-20 DIAGNOSIS — G2581 Restless legs syndrome: Secondary | ICD-10-CM | POA: Insufficient documentation

## 2023-08-20 DIAGNOSIS — I2510 Atherosclerotic heart disease of native coronary artery without angina pectoris: Secondary | ICD-10-CM | POA: Insufficient documentation

## 2023-08-20 DIAGNOSIS — Z7902 Long term (current) use of antithrombotics/antiplatelets: Secondary | ICD-10-CM | POA: Insufficient documentation

## 2023-08-20 DIAGNOSIS — H2512 Age-related nuclear cataract, left eye: Secondary | ICD-10-CM | POA: Diagnosis present

## 2023-08-20 DIAGNOSIS — Z955 Presence of coronary angioplasty implant and graft: Secondary | ICD-10-CM | POA: Diagnosis not present

## 2023-08-20 DIAGNOSIS — Z7952 Long term (current) use of systemic steroids: Secondary | ICD-10-CM | POA: Insufficient documentation

## 2023-08-20 DIAGNOSIS — Z87891 Personal history of nicotine dependence: Secondary | ICD-10-CM | POA: Diagnosis not present

## 2023-08-20 HISTORY — DX: Chronic obstructive pulmonary disease, unspecified: J44.9

## 2023-08-20 HISTORY — DX: Complete loss of teeth, unspecified cause, unspecified class: K08.109

## 2023-08-20 HISTORY — DX: Chronic cough: R05.3

## 2023-08-20 HISTORY — DX: Gastro-esophageal reflux disease without esophagitis: K21.9

## 2023-08-20 HISTORY — PX: CATARACT EXTRACTION W/PHACO: SHX586

## 2023-08-20 SURGERY — PHACOEMULSIFICATION, CATARACT, WITH IOL INSERTION
Anesthesia: Monitor Anesthesia Care | Laterality: Left

## 2023-08-20 MED ORDER — SIGHTPATH DOSE#1 BSS IO SOLN
INTRAOCULAR | Status: DC | PRN
  Administered 2023-08-20: 15 mL via INTRAOCULAR

## 2023-08-20 MED ORDER — ARMC OPHTHALMIC DILATING DROPS
1.0000 | OPHTHALMIC | Status: DC | PRN
  Administered 2023-08-20 (×3): 1 via OPHTHALMIC

## 2023-08-20 MED ORDER — SIGHTPATH DOSE#1 BSS IO SOLN
INTRAOCULAR | Status: DC | PRN
  Administered 2023-08-20: 88 mL via OPHTHALMIC

## 2023-08-20 MED ORDER — FENTANYL CITRATE (PF) 100 MCG/2ML IJ SOLN
INTRAMUSCULAR | Status: DC | PRN
  Administered 2023-08-20: 50 ug via INTRAVENOUS

## 2023-08-20 MED ORDER — MOXIFLOXACIN HCL 0.5 % OP SOLN
OPHTHALMIC | Status: DC | PRN
  Administered 2023-08-20: .2 mL via OPHTHALMIC

## 2023-08-20 MED ORDER — SODIUM CHLORIDE 0.9% FLUSH: 10.0000 mL | INTRAVENOUS | Status: DC | PRN

## 2023-08-20 MED ORDER — LIDOCAINE HCL (PF) 2 % IJ SOLN
INTRAOCULAR | Status: DC | PRN
  Administered 2023-08-20: 1 mL via INTRAOCULAR

## 2023-08-20 MED ORDER — SIGHTPATH DOSE#1 NA HYALUR & NA CHOND-NA HYALUR IO KIT
PACK | INTRAOCULAR | Status: DC | PRN
  Administered 2023-08-20: 1 via OPHTHALMIC

## 2023-08-20 MED ORDER — MIDAZOLAM HCL 2 MG/2ML IJ SOLN
INTRAMUSCULAR | Status: AC
  Filled 2023-08-20: qty 2

## 2023-08-20 MED ORDER — LACTATED RINGERS IV SOLN: INTRAVENOUS | Status: DC

## 2023-08-20 MED ORDER — TETRACAINE HCL 0.5 % OP SOLN
1.0000 [drp] | OPHTHALMIC | Status: DC | PRN
  Administered 2023-08-20 (×3): 1 [drp] via OPHTHALMIC

## 2023-08-20 MED ORDER — ARMC OPHTHALMIC DILATING DROPS
OPHTHALMIC | Status: AC
  Filled 2023-08-20: qty 0.5

## 2023-08-20 MED ORDER — FENTANYL CITRATE (PF) 100 MCG/2ML IJ SOLN
INTRAMUSCULAR | Status: AC
  Filled 2023-08-20: qty 2

## 2023-08-20 MED ORDER — TETRACAINE HCL 0.5 % OP SOLN
OPHTHALMIC | Status: AC
  Filled 2023-08-20: qty 4

## 2023-08-20 MED ORDER — MIDAZOLAM HCL 2 MG/2ML IJ SOLN
INTRAMUSCULAR | Status: DC | PRN
  Administered 2023-08-20: 1 mg via INTRAVENOUS

## 2023-08-20 MED ORDER — BRIMONIDINE TARTRATE-TIMOLOL 0.2-0.5 % OP SOLN
OPHTHALMIC | Status: DC | PRN
  Administered 2023-08-20: 1 [drp] via OPHTHALMIC

## 2023-08-20 SURGICAL SUPPLY — 10 items
CATARACT SUITE SIGHTPATH (MISCELLANEOUS) ×1
DISSECTOR HYDRO NUCLEUS 50X22 (MISCELLANEOUS) ×1 IMPLANT
DRSG TEGADERM 2-3/8X2-3/4 SM (GAUZE/BANDAGES/DRESSINGS) ×1 IMPLANT
FEE CATARACT SUITE SIGHTPATH (MISCELLANEOUS) ×1 IMPLANT
GLOVE SURG SYN 7.5 E (GLOVE) ×1
GLOVE SURG SYN 7.5 PF PI (GLOVE) ×1 IMPLANT
GLOVE SURG SYN 8.5 E (GLOVE) ×1
GLOVE SURG SYN 8.5 PF PI (GLOVE) ×1 IMPLANT
KNIFE SIDECUT EYE (MISCELLANEOUS) IMPLANT
LENS IOL TECNIS EYHANCE 15.5 (Intraocular Lens) IMPLANT

## 2023-08-20 NOTE — Anesthesia Preprocedure Evaluation (Signed)
Anesthesia Evaluation  Patient identified by MRN, date of birth, ID band Patient awake    Reviewed: Allergy & Precautions, H&P , NPO status , Patient's Chart, lab work & pertinent test results, reviewed documented beta blocker date and time   History of Anesthesia Complications Negative for: history of anesthetic complications  Airway Mallampati: III  TM Distance: >3 FB Neck ROM: full    Dental  (+) Chipped, Dental Advidsory Given   Pulmonary COPD,  oxygen dependent, former smoker   Pulmonary exam normal        Cardiovascular Exercise Tolerance: Good (-) hypertension(-) angina + CAD and + Past MI  (-) CABG Normal cardiovascular exam(-) dysrhythmias (-) Valvular Problems/Murmurs Rhythm:Regular Rate:Normal - Systolic murmurs  NSTEMI 09/2021, no stents placed. On ASA and Plavix   Neuro/Psych negative neurological ROS  negative psych ROS   GI/Hepatic Neg liver ROS,GERD  Medicated and Controlled,,  Endo/Other  negative endocrine ROS    Renal/GU      Musculoskeletal   Abdominal   Peds  Hematology negative hematology ROS (+)   Anesthesia Other Findings Past Medical History: No date: Chronic cough No date: COPD (chronic obstructive pulmonary disease) (HCC) No date: Coronary artery disease No date: Edentulous No date: Elevated cholesterol No date: GERD (gastroesophageal reflux disease) No date: Myocardial infarction (HCC) No date: Pulmonary fibrosis (HCC) No date: Restless leg syndrome  Past Surgical History: 2010: ANGIOPLASTY     Comment:  2 stents placed No date: BACK SURGERY No date: cardiac stents     Comment:  x2 No date: COLONOSCOPY 03/05/2018: COLONOSCOPY WITH PROPOFOL; N/A     Comment:  Procedure: COLONOSCOPY WITH PROPOFOL;  Surgeon:               Pasty Spillers, MD;  Location: [facility] ENDOSCOPY;                Service: Endoscopy;  Laterality: N/A; 01/24/2022: COLONOSCOPY WITH PROPOFOL; N/A      Comment:  Procedure: COLONOSCOPY WITH PROPOFOL;  Surgeon: Wyline Mood, MD;  Location[facility] ENDOSCOPY;  Service:               Gastroenterology;  Laterality: N/A; No date: CORONARY ANGIOPLASTY 09/30/2019: CORONARY/GRAFT ACUTE MI REVASCULARIZATION; N/A     Comment:  Procedure: Coronary/Graft Acute MI Revascularization;                Surgeon: Iran Ouch, MD;  Location: [facility] INVASIVE               CV LAB;  Service: Cardiovascular;  Laterality: N/A; 09/30/2019: LEFT HEART CATH AND CORONARY ANGIOGRAPHY; N/A     Comment:  Procedure: LEFT HEART CATH AND CORONARY ANGIOGRAPHY;                Surgeon: Iran Ouch, MD;  Location: [facility] INVASIVE               CV LAB;  Service: Cardiovascular;  Laterality: N/A; 10/01/2021: LEFT HEART CATH AND CORS/GRAFTS ANGIOGRAPHY; N/A     Comment:  Procedure: LEFT HEART CATH AND CORS/GRAFTS ANGIOGRAPHY;               Surgeon: Yvonne Kendall, MD;  Location: [facility] INVASIVE               CV LAB;  Service: Cardiovascular;  Laterality: N/A;  BMI    Body Mass Index: 22.66 kg/m  Reproductive/Obstetrics negative OB ROS                             Anesthesia Physical Anesthesia Plan  ASA: 4  Anesthesia Plan: MAC   Post-op Pain Management:    Induction: Intravenous  PONV Risk Score and Plan: 1 and Midazolam and TIVA  Airway Management Planned: Natural Airway and Nasal Cannula  Additional Equipment:   Intra-op Plan:   Post-operative Plan:   Informed Consent: I have reviewed the patients History and Physical, chart, labs and discussed the procedure including the risks, benefits and alternatives for the proposed anesthesia with the patient or authorized representative who has indicated his/her understanding and acceptance.     Dental Advisory Given  Plan Discussed with: Anesthesiologist, CRNA and Surgeon  Anesthesia Plan Comments: (Patient consented for risks of anesthesia including but not limited  to:  - adverse reactions to medications - damage to eyes, teeth, lips or other oral mucosa - nerve damage due to positioning  - sore throat or hoarseness - Damage to heart, brain, nerves, lungs, other parts of body or loss of life  Patient voiced understanding and assent.)       Anesthesia Quick Evaluation

## 2023-08-20 NOTE — Anesthesia Postprocedure Evaluation (Signed)
Anesthesia Post Note  Patient: Rupinder Therrell  Procedure(s) Performed: CATARACT EXTRACTION PHACO AND INTRAOCULAR LENS PLACEMENT (IOC) LEFT 16.87 01:21.8 (Left)  Patient location during evaluation: PACU Anesthesia Type: MAC Level of consciousness: awake and alert Pain management: pain level controlled Vital Signs Assessment: post-procedure vital signs reviewed and stable Respiratory status: spontaneous breathing, nonlabored ventilation, respiratory function stable and patient connected to nasal cannula oxygen Cardiovascular status: stable and blood pressure returned to baseline Postop Assessment: no apparent nausea or vomiting Anesthetic complications: no  No notable events documented.   Last Vitals:  Vitals:   08/20/23 0928 08/20/23 0935  BP: 135/81 137/82  Pulse: 69 62  Resp: (!) 24 15  Temp: (!) 36.4 C (!) 36.4 C  SpO2: 94% 94%    Last Pain:  Vitals:   08/20/23 0935  TempSrc:   PainSc: 0-No pain                 Stephanie Coup

## 2023-08-20 NOTE — Transfer of Care (Signed)
Immediate Anesthesia Transfer of Care Note  Patient: Dillon [name]  Procedure(s) Performed: CATARACT EXTRACTION PHACO AND INTRAOCULAR LENS PLACEMENT (IOC) LEFT 16.87 01:21.8 (Left)  Patient Location: PACU  Anesthesia Type: MAC  Level of Consciousness: awake, alert  and patient cooperative  Airway and Oxygen Therapy: Patient Spontanous Breathing and Patient connected to supplemental oxygen  Post-op Assessment: Post-op Vital signs reviewed, Patient's Cardiovascular Status Stable, Respiratory Function Stable, Patent Airway and No signs of Nausea or vomiting  Post-op Vital Signs: Reviewed and stable  Complications: No notable events documented.

## 2023-08-20 NOTE — H&P (Signed)
[facility]   Primary Care Physician:  Corky Downs, MD Ophthalmologist: Dr. Deberah Pelton  Pre-Procedure History & Physical: HPI:  Dillon [name] is a 67 y.o. male here for cataract surgery.   Past Medical History:  Diagnosis Date   Chronic cough    COPD (chronic obstructive pulmonary disease) (HCC)    Coronary artery disease    Edentulous    Elevated cholesterol    GERD (gastroesophageal reflux disease)    Myocardial infarction (HCC)    Pulmonary fibrosis (HCC)    Restless leg syndrome     Past Surgical History:  Procedure Laterality Date   ANGIOPLASTY  2010   2 stents placed   BACK SURGERY     cardiac stents     x2   COLONOSCOPY     COLONOSCOPY WITH PROPOFOL N/A 03/05/2018   Procedure: COLONOSCOPY WITH PROPOFOL;  Surgeon: Pasty Spillers, MD;  Location: [facility] ENDOSCOPY;  Service: Endoscopy;  Laterality: N/A;   COLONOSCOPY WITH PROPOFOL N/A 01/24/2022   Procedure: COLONOSCOPY WITH PROPOFOL;  Surgeon: Wyline Mood, MD;  Location: [facility] ENDOSCOPY;  Service: Gastroenterology;  Laterality: N/A;   CORONARY ANGIOPLASTY     CORONARY/GRAFT ACUTE MI REVASCULARIZATION N/A 09/30/2019   Procedure: Coronary/Graft Acute MI Revascularization;  Surgeon: Iran Ouch, MD;  Location: [facility] INVASIVE CV LAB;  Service: Cardiovascular;  Laterality: N/A;   LEFT HEART CATH AND CORONARY ANGIOGRAPHY N/A 09/30/2019   Procedure: LEFT HEART CATH AND CORONARY ANGIOGRAPHY;  Surgeon: Iran Ouch, MD;  Location: [facility] INVASIVE CV LAB;  Service: Cardiovascular;  Laterality: N/A;   LEFT HEART CATH AND CORS/GRAFTS ANGIOGRAPHY N/A 10/01/2021   Procedure: LEFT HEART CATH AND CORS/GRAFTS ANGIOGRAPHY;  Surgeon: Yvonne Kendall, MD;  Location: [facility] INVASIVE CV LAB;  Service: Cardiovascular;  Laterality: N/A;    Prior to Admission medications   Medication Sig Start Date End Date Taking? Authorizing Provider  acetaminophen (TYLENOL) 500 MG tablet Take 1,000 mg by mouth every 8 (eight) hours as  needed for moderate pain.   Yes [provider]  albuterol (PROVENTIL) (2.5 MG/3ML) 0.083% nebulizer solution Take 2.5 mg by nebulization every 6 (six) hours as needed for wheezing. 10/23/22 10/23/23 Yes [provider]  ALPHA LIPOIC ACID PO Take 500-1,000 mg by mouth daily.   Yes [provider]  atorvastatin (LIPITOR) 40 MG tablet TAKE 1 TABLET BY MOUTH EVERY DAY 07/09/23  Yes Iran Ouch, MD  carvedilol (COREG) 3.125 MG tablet TAKE 1 TABLET BY MOUTH TWICE A DAY WITH FOOD 12/02/22  Yes Iran Ouch, MD  clopidogrel (PLAVIX) 75 MG tablet TAKE 1/2 TABLET (37.5 MG TOTAL) BY MOUTH EVERY OTHER DAY. Patient taking differently: Take 75 mg by mouth every other day. 07/01/23  Yes Iran Ouch, MD  Fluticasone-Umeclidin-Vilant (TRELEGY ELLIPTA) 100-62.5-25 MCG/ACT AEPB Inhale 1 puff into the lungs daily. 10/22/22  Yes Masoud, Renda Rolls, MD  nitroGLYCERIN (NITROSTAT) 0.4 MG SL tablet Place 1 tablet (0.4 mg total) under the tongue every 5 (five) minutes x 3 doses as needed for chest pain. 10/02/21  Yes Enedina Finner, MD  OXYGEN Inhale 4 L into the lungs continuous.   Yes [provider]  pantoprazole (PROTONIX) 40 MG tablet Take 40 mg by mouth daily. 05/16/23  Yes [provider]  Pirfenidone (ESBRIET) 267 MG CAPS Take 3 capsules by mouth 3 (three) times daily.   Yes [provider]  predniSONE (DELTASONE) 10 MG tablet Take 10 mg by mouth daily.   Yes [provider]  roflumilast (DALIRESP) 500 MCG TABS tablet Take 1 tablet by mouth daily. 06/10/23  Yes [provider]  oxymetazoline (AFRIN) 0.05 % nasal spray Place 1 spray into both nostrils 2 (two) times daily as needed for congestion. Patient not taking: Reported on 08/12/2023 10/30/22   Gillis Santa, MD  triamcinolone ointment (KENALOG) 0.1 % APPLY TO ITCHY AREAS AT NECK AND BODY TWICE A DAY FOR UP TO 2 WEEKS. AVOID APPLYING TO FACE, GROIN, AND AXILLA. USE AS DIRECTED. LONG-TERM USE CAN  CAUSE THINNING OF THE SKIN. 04/07/23   Willeen Niece, MD    Allergies as of 07/27/2023 - Review Complete 07/09/2023  Allergen Reaction Noted   Ace inhibitors Swelling 10/28/2017    Family History  Problem Relation Age of Onset   Heart disease Mother    Heart disease Father     Social History   Socioeconomic History   Marital status: Married    Spouse name: Not on file   Number of children: Not on file   Years of education: Not on file   Highest education level: Not on file  Occupational History   Not on file  Tobacco Use   Smoking status: Former    Current packs/day: 0.00    Average packs/day: 2.0 packs/day for 40.0 years (80.0 ttl pk-yrs)    Types: Cigarettes    Start date: 72    Quit date: 2013    Years since quitting: 11.8   Smokeless tobacco: Never  Vaping Use   Vaping status: Never Used  Substance and Sexual Activity   Alcohol use: Not Currently    Comment: occassionally - 1 beer/month or less   Drug use: Not Currently   Sexual activity: Not Currently  Other Topics Concern   Not on file  Social History Narrative   Lives locally with wife.  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   Social Determinants of Health   Financial Resource Strain: Not on file  Food Insecurity: No Food Insecurity (10/25/2022)   Hunger Vital Sign    Worried About Running Out of Food in the Last Year: Never true    Ran Out of Food in the Last Year: Never true  Transportation Needs: No Transportation Needs (10/25/2022)   PRAPARE - Administrator, Civil Service (Medical): No    Lack of Transportation (Non-Medical): No  Physical Activity: Not on file  Stress: Not on file  Social Connections: Not on file  Intimate Partner Violence: Not At Risk (10/25/2022)   Humiliation, Afraid, Rape, and Kick questionnaire    Fear of Current or Ex-Partner: No    Emotionally Abused: No    Physically Abused: No    Sexually Abused: No    Review of Systems: See  HPI, otherwise negative ROS  Physical Exam: Ht 6\' 2"  (1.88 m)   Wt 78 kg   BMI 22.08 kg/m  General:   Alert, cooperative in NAD Head:  Normocephalic and atraumatic. Respiratory:  Normal work of breathing. Cardiovascular:  RRR  Impression/Plan: Dillon [name] is here for cataract surgery.  Risks, benefits, limitations, and alternatives regarding cataract surgery have been reviewed with the patient.  Questions have been answered.  All parties agreeable.   Dillon Pandy, MD  08/20/2023, 7:12 AM

## 2023-08-20 NOTE — Op Note (Signed)
OPERATIVE NOTE  Dillon [name] [id number] 08/20/2023   PREOPERATIVE DIAGNOSIS: Nuclear sclerotic cataract left eye. H25.12   POSTOPERATIVE DIAGNOSIS: Nuclear sclerotic cataract left eye. H25.12   PROCEDURE:  Phacoemusification with posterior chamber intraocular lens placement of the left eye  Ultrasound time: Procedure(s): CATARACT EXTRACTION PHACO AND INTRAOCULAR LENS PLACEMENT (IOC) LEFT 16.87 01:21.8 (Left)  LENS:   Implant Name Type Inv. Item Serial No. Manufacturer Lot No. LRB No. Used Action  LENS IOL TECNIS EYHANCE 15.5 - [id number] Intraocular Lens LENS IOL TECNIS EYHANCE 15.5  SIGHTPATH  Left 1 Implanted      SURGEON:  Julious Payer. Rolley Sims, MD   ANESTHESIA:  Topical with tetracaine drops, augmented with 1% preservative-free intracameral lidocaine.   COMPLICATIONS:  None.   DESCRIPTION OF PROCEDURE:  The patient was identified in the holding room and transported to the operating room and placed in the supine position under the operating microscope.  The left eye was identified as the operative eye, which was prepped and draped in the usual sterile ophthalmic fashion.   A 1 millimeter clear-corneal paracentesis was made inferotemporally. Preservative-free 1% lidocaine mixed with 1:1,000 bisulfite-free aqueous solution of epinephrine was injected into the anterior chamber. The anterior chamber was then filled with Viscoat viscoelastic. A 2.4 millimeter keratome was used to make a clear-corneal incision superotemporally. A curvilinear capsulorrhexis was made with a cystotome and capsulorrhexis forceps. Balanced salt solution was used to hydrodissect and hydrodelineate the nucleus. Phacoemulsification was then used to remove the lens nucleus and epinucleus. The remaining cortex was then removed using the irrigation and aspiration handpiece. Provisc was then placed into the capsular bag to distend it for lens placement. A +15.50 D DIB00 intraocular lens was then injected into the capsular  bag. The remaining viscoelastic was aspirated.   Wounds were hydrated with balanced salt solution.  The anterior chamber was inflated to a physiologic pressure with balanced salt solution.  No wound leaks were noted. Moxifloxacin was injected intracamerally.  Timolol and Brimonidine drops were applied to the eye.  The patient was taken to the recovery room in stable condition without complications of anesthesia or surgery.  [employer] 08/20/2023, 9:26 AM

## 2023-08-21 ENCOUNTER — Encounter: Payer: Self-pay | Admitting: Ophthalmology

## 2023-08-28 NOTE — Anesthesia Preprocedure Evaluation (Signed)
Anesthesia Evaluation  Patient identified by MRN, date of birth, ID band Patient awake    Reviewed: Allergy & Precautions, H&P , NPO status , Patient's Chart, lab work & pertinent test results  Airway Mallampati: III  TM Distance: >3 FB Neck ROM: Full    Dental no notable dental hx.    Pulmonary neg pulmonary ROS, COPD, former smoker Oxygen dependent, 4 L/m/n/c  Severe COPD   Pulmonary exam normal breath sounds clear to auscultation       Cardiovascular hypertension, + CAD and + Past MI  negative cardio ROS Normal cardiovascular exam Rhythm:Regular Rate:Normal     Neuro/Psych negative neurological ROS  negative psych ROS   GI/Hepatic negative GI ROS, Neg liver ROS,GERD  ,,  Endo/Other  negative endocrine ROS    Renal/GU negative Renal ROS  negative genitourinary   Musculoskeletal negative musculoskeletal ROS (+)    Abdominal   Peds negative pediatric ROS (+)  Hematology negative hematology ROS (+)   Anesthesia Other Findings Coronary artery disease  Elevated cholesterol Restless leg syndrome  Pulmonary fibrosis  Myocardial infarction  COPD (chronic obstructive pulmonary disease)  Edentulous  Chronic cough GERD (gastroesophageal reflux disease) Oxygen dependent   Previous cataract surgery 08-20-2023 Dr. Lorette Ang anesthesiologist   Reproductive/Obstetrics negative OB ROS                             Anesthesia Physical Anesthesia Plan  ASA: 4  Anesthesia Plan: MAC   Post-op Pain Management:    Induction: Intravenous  PONV Risk Score and Plan:   Airway Management Planned: Natural Airway and Nasal Cannula  Additional Equipment:   Intra-op Plan:   Post-operative Plan:   Informed Consent: I have reviewed the patients History and Physical, chart, labs and discussed the procedure including the risks, benefits and alternatives for the proposed anesthesia with the patient  or authorized representative who has indicated his/her understanding and acceptance.     Dental Advisory Given  Plan Discussed with: Anesthesiologist, CRNA and Surgeon  Anesthesia Plan Comments: (Patient consented for risks of anesthesia including but not limited to:  - adverse reactions to medications - damage to eyes, teeth, lips or other oral mucosa - nerve damage due to positioning  - sore throat or hoarseness - Damage to heart, brain, nerves, lungs, other parts of body or loss of life  Patient voiced understanding and assent.)        Anesthesia Quick Evaluation

## 2023-09-01 NOTE — Discharge Instructions (Signed)

## 2023-09-03 ENCOUNTER — Other Ambulatory Visit: Payer: Self-pay

## 2023-09-03 ENCOUNTER — Ambulatory Visit: Payer: Medicare Other | Admitting: Anesthesiology

## 2023-09-03 ENCOUNTER — Encounter: Payer: Self-pay | Admitting: Ophthalmology

## 2023-09-03 ENCOUNTER — Ambulatory Visit
Admission: RE | Admit: 2023-09-03 | Discharge: 2023-09-03 | Disposition: A | Discharge status: Home / Self Care | Payer: Medicare Other | Attending: Ophthalmology | Admitting: Ophthalmology

## 2023-09-03 ENCOUNTER — Encounter
Admission: RE | Disposition: A | Discharge status: Home / Self Care | Payer: Self-pay | Source: Home / Self Care | Attending: Ophthalmology

## 2023-09-03 DIAGNOSIS — J449 Chronic obstructive pulmonary disease, unspecified: Secondary | ICD-10-CM | POA: Insufficient documentation

## 2023-09-03 DIAGNOSIS — J8410 Pulmonary fibrosis, unspecified: Secondary | ICD-10-CM | POA: Insufficient documentation

## 2023-09-03 DIAGNOSIS — Z9981 Dependence on supplemental oxygen: Secondary | ICD-10-CM | POA: Diagnosis not present

## 2023-09-03 DIAGNOSIS — Z87891 Personal history of nicotine dependence: Secondary | ICD-10-CM | POA: Insufficient documentation

## 2023-09-03 DIAGNOSIS — Z7902 Long term (current) use of antithrombotics/antiplatelets: Secondary | ICD-10-CM | POA: Insufficient documentation

## 2023-09-03 DIAGNOSIS — Z7951 Long term (current) use of inhaled steroids: Secondary | ICD-10-CM | POA: Diagnosis not present

## 2023-09-03 DIAGNOSIS — I2510 Atherosclerotic heart disease of native coronary artery without angina pectoris: Secondary | ICD-10-CM | POA: Diagnosis not present

## 2023-09-03 DIAGNOSIS — Z955 Presence of coronary angioplasty implant and graft: Secondary | ICD-10-CM | POA: Diagnosis not present

## 2023-09-03 DIAGNOSIS — K219 Gastro-esophageal reflux disease without esophagitis: Secondary | ICD-10-CM | POA: Diagnosis not present

## 2023-09-03 DIAGNOSIS — I252 Old myocardial infarction: Secondary | ICD-10-CM | POA: Insufficient documentation

## 2023-09-03 DIAGNOSIS — H2511 Age-related nuclear cataract, right eye: Secondary | ICD-10-CM | POA: Insufficient documentation

## 2023-09-03 DIAGNOSIS — Z7952 Long term (current) use of systemic steroids: Secondary | ICD-10-CM | POA: Diagnosis not present

## 2023-09-03 HISTORY — DX: Dependence on supplemental oxygen: Z99.81

## 2023-09-03 HISTORY — PX: CATARACT EXTRACTION W/PHACO: SHX586

## 2023-09-03 SURGERY — PHACOEMULSIFICATION, CATARACT, WITH IOL INSERTION
Anesthesia: Monitor Anesthesia Care | Laterality: Right

## 2023-09-03 MED ORDER — FENTANYL CITRATE (PF) 100 MCG/2ML IJ SOLN
INTRAMUSCULAR | Status: DC | PRN
  Administered 2023-09-03: 50 ug via INTRAVENOUS

## 2023-09-03 MED ORDER — SIGHTPATH DOSE#1 BSS IO SOLN
INTRAOCULAR | Status: DC | PRN
  Administered 2023-09-03: 92 mL via OPHTHALMIC

## 2023-09-03 MED ORDER — BRIMONIDINE TARTRATE-TIMOLOL 0.2-0.5 % OP SOLN
OPHTHALMIC | Status: DC | PRN
  Administered 2023-09-03: 1 [drp] via OPHTHALMIC

## 2023-09-03 MED ORDER — MOXIFLOXACIN HCL 0.5 % OP SOLN
OPHTHALMIC | Status: DC | PRN
  Administered 2023-09-03: .2 mL via OPHTHALMIC

## 2023-09-03 MED ORDER — LIDOCAINE HCL (PF) 2 % IJ SOLN
INTRAOCULAR | Status: DC | PRN
  Administered 2023-09-03: 1 mL via INTRAOCULAR

## 2023-09-03 MED ORDER — SIGHTPATH DOSE#1 BSS IO SOLN
INTRAOCULAR | Status: DC | PRN
  Administered 2023-09-03: 15 mL via INTRAOCULAR

## 2023-09-03 MED ORDER — SIGHTPATH DOSE#1 NA HYALUR & NA CHOND-NA HYALUR IO KIT
PACK | INTRAOCULAR | Status: DC | PRN
  Administered 2023-09-03: 1 via OPHTHALMIC

## 2023-09-03 MED ORDER — TETRACAINE HCL 0.5 % OP SOLN
OPHTHALMIC | Status: AC
  Filled 2023-09-03: qty 4

## 2023-09-03 MED ORDER — MIDAZOLAM HCL 2 MG/2ML IJ SOLN
INTRAMUSCULAR | Status: AC
  Filled 2023-09-03: qty 2

## 2023-09-03 MED ORDER — TETRACAINE HCL 0.5 % OP SOLN
1.0000 [drp] | OPHTHALMIC | Status: DC | PRN
  Administered 2023-09-03 (×3): 1 [drp] via OPHTHALMIC

## 2023-09-03 MED ORDER — ARMC OPHTHALMIC DILATING DROPS
OPHTHALMIC | Status: AC
  Filled 2023-09-03: qty 0.5

## 2023-09-03 MED ORDER — FENTANYL CITRATE (PF) 100 MCG/2ML IJ SOLN
INTRAMUSCULAR | Status: AC
  Filled 2023-09-03: qty 2

## 2023-09-03 MED ORDER — ARMC OPHTHALMIC DILATING DROPS
1.0000 | OPHTHALMIC | Status: DC | PRN
  Administered 2023-09-03 (×3): 1 via OPHTHALMIC

## 2023-09-03 MED ORDER — MIDAZOLAM HCL 2 MG/2ML IJ SOLN
INTRAMUSCULAR | Status: DC | PRN
  Administered 2023-09-03 (×2): 1 mg via INTRAVENOUS

## 2023-09-03 SURGICAL SUPPLY — 9 items
CATARACT SUITE SIGHTPATH (MISCELLANEOUS) ×1
DISSECTOR HYDRO NUCLEUS 50X22 (MISCELLANEOUS) ×1 IMPLANT
DRSG TEGADERM 2-3/8X2-3/4 SM (GAUZE/BANDAGES/DRESSINGS) ×1 IMPLANT
FEE CATARACT SUITE SIGHTPATH (MISCELLANEOUS) ×1 IMPLANT
GLOVE SURG SYN 7.5 E (GLOVE) ×1
GLOVE SURG SYN 7.5 PF PI (GLOVE) ×1 IMPLANT
GLOVE SURG SYN 8.5 E (GLOVE) ×1
GLOVE SURG SYN 8.5 PF PI (GLOVE) ×1 IMPLANT
LENS IOL TECNIS EYHANCE 15.5 (Intraocular Lens) IMPLANT

## 2023-09-03 NOTE — H&P (Signed)
[facility]   Primary Care Physician:  Corky Downs, MD Ophthalmologist: Dr. Deberah Pelton  Pre-Procedure History & Physical: HPI:  Dillon [name] is a 67 y.o. male here for cataract surgery.   Past Medical History:  Diagnosis Date   Chronic cough    COPD (chronic obstructive pulmonary disease) (HCC)    Coronary artery disease    Edentulous    Elevated cholesterol    GERD (gastroesophageal reflux disease)    Myocardial infarction (HCC)    Pulmonary fibrosis (HCC)    Restless leg syndrome     Past Surgical History:  Procedure Laterality Date   ANGIOPLASTY  2010   2 stents placed   BACK SURGERY     cardiac stents     x2   CATARACT EXTRACTION W/PHACO Left 08/20/2023   Procedure: CATARACT EXTRACTION PHACO AND INTRAOCULAR LENS PLACEMENT (IOC) LEFT 16.87 01:21.8;  Surgeon: Estanislado Pandy, MD;  Location: [facility];  Service: Ophthalmology;  Laterality: Left;   COLONOSCOPY     COLONOSCOPY WITH PROPOFOL N/A 03/05/2018   Procedure: COLONOSCOPY WITH PROPOFOL;  Surgeon: Pasty Spillers, MD;  Location: [facility] ENDOSCOPY;  Service: Endoscopy;  Laterality: N/A;   COLONOSCOPY WITH PROPOFOL N/A 01/24/2022   Procedure: COLONOSCOPY WITH PROPOFOL;  Surgeon: Wyline Mood, MD;  Location: [facility] ENDOSCOPY;  Service: Gastroenterology;  Laterality: N/A;   CORONARY ANGIOPLASTY     CORONARY/GRAFT ACUTE MI REVASCULARIZATION N/A 09/30/2019   Procedure: Coronary/Graft Acute MI Revascularization;  Surgeon: Iran Ouch, MD;  Location: [facility] INVASIVE CV LAB;  Service: Cardiovascular;  Laterality: N/A;   LEFT HEART CATH AND CORONARY ANGIOGRAPHY N/A 09/30/2019   Procedure: LEFT HEART CATH AND CORONARY ANGIOGRAPHY;  Surgeon: Iran Ouch, MD;  Location: [facility] INVASIVE CV LAB;  Service: Cardiovascular;  Laterality: N/A;   LEFT HEART CATH AND CORS/GRAFTS ANGIOGRAPHY N/A 10/01/2021   Procedure: LEFT HEART CATH AND CORS/GRAFTS ANGIOGRAPHY;  Surgeon: Yvonne Kendall, MD;  Location:  [facility] INVASIVE CV LAB;  Service: Cardiovascular;  Laterality: N/A;    Prior to Admission medications   Medication Sig Start Date End Date Taking? Authorizing Provider  acetaminophen (TYLENOL) 500 MG tablet Take 1,000 mg by mouth every 8 (eight) hours as needed for moderate pain.    [provider]  albuterol (PROVENTIL) (2.5 MG/3ML) 0.083% nebulizer solution Take 2.5 mg by nebulization every 6 (six) hours as needed for wheezing. 10/23/22 10/23/23  [provider]  ALPHA LIPOIC ACID PO Take 500-1,000 mg by mouth daily.    [provider]  atorvastatin (LIPITOR) 40 MG tablet TAKE 1 TABLET BY MOUTH EVERY DAY 07/09/23   Iran Ouch, MD  carvedilol (COREG) 3.125 MG tablet TAKE 1 TABLET BY MOUTH TWICE A DAY WITH FOOD 12/02/22   Iran Ouch, MD  clopidogrel (PLAVIX) 75 MG tablet TAKE 1/2 TABLET (37.5 MG TOTAL) BY MOUTH EVERY OTHER DAY. Patient taking differently: Take 75 mg by mouth every other day. 07/01/23   Iran Ouch, MD  Fluticasone-Umeclidin-Vilant (TRELEGY ELLIPTA) 100-62.5-25 MCG/ACT AEPB Inhale 1 puff into the lungs daily. 10/22/22   Corky Downs, MD  nitroGLYCERIN (NITROSTAT) 0.4 MG SL tablet Place 1 tablet (0.4 mg total) under the tongue every 5 (five) minutes x 3 doses as needed for chest pain. 10/02/21   Enedina Finner, MD  OXYGEN Inhale 4 L into the lungs continuous.    [provider]  oxymetazoline (AFRIN) 0.05 % nasal spray Place 1 spray into both nostrils 2 (two) times daily as needed for  congestion. Patient not taking: Reported on 08/12/2023 10/30/22   Gillis Santa, MD  pantoprazole (PROTONIX) 40 MG tablet Take 40 mg by mouth daily. 05/16/23   [provider]  Pirfenidone (ESBRIET) 267 MG CAPS Take 3 capsules by mouth 3 (three) times daily.    [provider]  predniSONE (DELTASONE) 10 MG tablet Take 10 mg by mouth daily.    [provider]  roflumilast (DALIRESP) 500 MCG TABS tablet Take 1 tablet by mouth daily.  06/10/23   [provider]  triamcinolone ointment (KENALOG) 0.1 % APPLY TO ITCHY AREAS AT NECK AND BODY TWICE A DAY FOR UP TO 2 WEEKS. AVOID APPLYING TO FACE, GROIN, AND AXILLA. USE AS DIRECTED. LONG-TERM USE CAN CAUSE THINNING OF THE SKIN. 04/07/23   Willeen Niece, MD    Allergies as of 07/27/2023 - Review Complete 07/09/2023  Allergen Reaction Noted   Ace inhibitors Swelling 10/28/2017    Family History  Problem Relation Age of Onset   Heart disease Mother    Heart disease Father     Social History   Socioeconomic History   Marital status: Married    Spouse name: Not on file   Number of children: Not on file   Years of education: Not on file   Highest education level: Not on file  Occupational History   Not on file  Tobacco Use   Smoking status: Former    Current packs/day: 0.00    Average packs/day: 2.0 packs/day for 40.0 years (80.0 ttl pk-yrs)    Types: Cigarettes    Start date: 63    Quit date: 2013    Years since quitting: 11.8   Smokeless tobacco: Never  Vaping Use   Vaping status: Never Used  Substance and Sexual Activity   Alcohol use: Not Currently    Comment: occassionally - 1 beer/month or less   Drug use: Not Currently   Sexual activity: Not Currently  Other Topics Concern   Not on file  Social History Narrative   Lives locally with wife.  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   Social Determinants of Health   Financial Resource Strain: Not on file  Food Insecurity: No Food Insecurity (10/25/2022)   Hunger Vital Sign    Worried About Running Out of Food in the Last Year: Never true    Ran Out of Food in the Last Year: Never true  Transportation Needs: No Transportation Needs (10/25/2022)   PRAPARE - Administrator, Civil Service (Medical): No    Lack of Transportation (Non-Medical): No  Physical Activity: Not on file  Stress: Not on file  Social Connections: Not on file  Intimate Partner  Violence: Not At Risk (10/25/2022)   Humiliation, Afraid, Rape, and Kick questionnaire    Fear of Current or Ex-Partner: No    Emotionally Abused: No    Physically Abused: No    Sexually Abused: No    Review of Systems: See HPI, otherwise negative ROS  Physical Exam: There were no vitals taken for this visit. General:   Alert, cooperative in NAD Head:  Normocephalic and atraumatic. Respiratory:  Normal work of breathing. Cardiovascular:  RRR  Impression/Plan: Dillon [name] is here for cataract surgery.  Risks, benefits, limitations, and alternatives regarding cataract surgery have been reviewed with the patient.  Questions have been answered.  All parties agreeable.   Estanislado Pandy, MD  09/03/2023, 7:05 AM

## 2023-09-03 NOTE — Anesthesia Postprocedure Evaluation (Signed)
Anesthesia Post Note  Patient: Dillon [name]  Procedure(s) Performed: CATARACT EXTRACTION PHACO AND INTRAOCULAR LENS PLACEMENT (IOC) RIGHT 19.41 01:30.0 (Right)  Patient location during evaluation: PACU Anesthesia Type: MAC Level of consciousness: awake and alert Pain management: pain level controlled Vital Signs Assessment: post-procedure vital signs reviewed and stable Respiratory status: spontaneous breathing, nonlabored ventilation, respiratory function stable and patient connected to nasal cannula oxygen Cardiovascular status: stable and blood pressure returned to baseline Postop Assessment: no apparent nausea or vomiting Anesthetic complications: no   No notable events documented.   Last Vitals:  Vitals:   09/03/23 0832 09/03/23 0850  BP: 139/89 138/88  Resp: (!) 26 (!) 28  Temp: 36.8 C 36.9 C  SpO2: 95% 95%    Last Pain:  Vitals:   09/03/23 0832  TempSrc:   PainSc: 0-No pain                 Bevely Palmer Elenna Spratling

## 2023-09-03 NOTE — Transfer of Care (Signed)
Immediate Anesthesia Transfer of Care Note  Patient: Dillon [name]  Procedure(s) Performed: CATARACT EXTRACTION PHACO AND INTRAOCULAR LENS PLACEMENT (IOC) RIGHT 19.41 01:30.0 (Right)  Patient Location: PACU  Anesthesia Type: MAC  Level of Consciousness: awake, alert  and patient cooperative  Airway and Oxygen Therapy: Patient Spontanous Breathing and Patient connected to supplemental oxygen  Post-op Assessment: Post-op Vital signs reviewed, Patient's Cardiovascular Status Stable, Respiratory Function Stable, Patent Airway and No signs of Nausea or vomiting  Post-op Vital Signs: Reviewed and stable  Complications: No notable events documented.

## 2023-09-03 NOTE — Op Note (Signed)
OPERATIVE NOTE  Dillon [name] [id number] 09/03/2023   PREOPERATIVE DIAGNOSIS: Nuclear sclerotic cataract right eye. H25.11   POSTOPERATIVE DIAGNOSIS: Nuclear sclerotic cataract right eye. H25.11   PROCEDURE:  Phacoemusification with posterior chamber intraocular lens placement of the right eye  Ultrasound time: Procedure(s): CATARACT EXTRACTION PHACO AND INTRAOCULAR LENS PLACEMENT (IOC) RIGHT 19.41 01:30.0 (Right)  LENS:   Implant Name Type Inv. Item Serial No. Manufacturer Lot No. LRB No. Used Action  LENS IOL TECNIS EYHANCE 15.5 - [id number] Intraocular Lens LENS IOL TECNIS EYHANCE 15.5 [id number] [employer]  Right 1 Implanted      SURGEON:  Julious Payer. Rolley Sims, MD   ANESTHESIA:  Topical with tetracaine drops, augmented with 1% preservative-free intracameral lidocaine.   COMPLICATIONS:  None.   DESCRIPTION OF PROCEDURE:  The patient was identified in the holding room and transported to the operating room and placed in the supine position under the operating microscope.  The right eye was identified as the operative eye, which was prepped and draped in the usual sterile ophthalmic fashion.   A 1 millimeter clear-corneal paracentesis was made superotemporally. Preservative-free 1% lidocaine mixed with 1:1,000 bisulfite-free aqueous solution of epinephrine was injected into the anterior chamber. The anterior chamber was then filled with Viscoat viscoelastic. A 2.4 millimeter keratome was used to make a clear-corneal incision inferotemporally. A curvilinear capsulorrhexis was made with a cystotome and capsulorrhexis forceps. Balanced salt solution was used to hydrodissect and hydrodelineate the nucleus. Phacoemulsification was then used to remove the lens nucleus and epinucleus. The remaining cortex was then removed using the irrigation and aspiration handpiece. Provisc was then placed into the capsular bag to distend it for lens placement. A +15.50 D DIB00 intraocular lens was then injected  into the capsular bag. The remaining viscoelastic was aspirated.   Wounds were hydrated with balanced salt solution.  The anterior chamber was inflated to a physiologic pressure with balanced salt solution.  No wound leaks were noted. Vigamox was injected intracamerally.  Timolol and Brimonidine drops were applied to the eye.  The patient was taken to the recovery room in stable condition without complications of anesthesia or surgery.  Rolly Pancake [place] 09/03/2023, 8:32 AM

## 2023-09-07 ENCOUNTER — Encounter: Payer: Self-pay | Admitting: Ophthalmology

## 2023-09-09 ENCOUNTER — Other Ambulatory Visit: Payer: Self-pay | Admitting: Cardiovascular Disease

## 2023-09-09 DIAGNOSIS — I2510 Atherosclerotic heart disease of native coronary artery without angina pectoris: Secondary | ICD-10-CM

## 2023-09-28 ENCOUNTER — Telehealth: Payer: Self-pay | Admitting: Cardiovascular Disease

## 2023-09-28 NOTE — Telephone Encounter (Signed)
Left voicemail message to call back  

## 2023-09-28 NOTE — Telephone Encounter (Signed)
Wife (Patient) stated when patient is having pains in his lower stomach area when he bends over.  Wife stated patient has to unbutton his pants to bend over.  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

## 2023-09-28 NOTE — Telephone Encounter (Signed)
Spoke with patients wife per release form. She reports that her husband has been having abdominal pain and has concerns that it could be his aneurysm. She requests to have him seen. Scheduled him to come in on Friday with provider that last saw him. No further needs or questions.

## 2023-10-01 NOTE — Progress Notes (Signed)
Cardiology Office Note    Date:  10/02/2023   ID:  Dillon [name], DOB 1956/06/08, MRN [id number]  PCP:  Corky Downs, MD  Cardiologist:  Lorine Bears, MD  Electrophysiologist:  None   Chief Complaint: Follow up  History of Present Illness:   Dillon [name] is a 67 y.o. male with history of CAD with ST elevation MI in 09/2019 status post PCI/DES to the LAD, chronic hypoxic respiratory failure on 4 L supplemental oxygen with interstitial pneumonitis, 3.5 x 3.3 cm infrarenal AAA by pet imaging in 10/2022, HLD, and prior tobacco use who presents for follow-up of CAD.   He was admitted to the hospital in 09/2019 with an ST elevation MI in the setting of COVID infection.  LHC showed an occluded apical LAD supplying the distal inferior wall, which was the culprit.  There was also 80% eccentric complex plaque rupture in the proximal LAD, which was felt to be responsible for the distal embolization.  RCA stent was patent.  He underwent successful PCI/DES to the proximal LAD.  The flow to the distal LAD was restored just by wiring of the occlusion site.  EF was normal.  He presented to the hospital in 09/2021 with an NSTEMI with a peak troponin of 3800.  LHC showed patent stents in the proximal LAD and distal RCA/RPDA.  No culprit was identified.  LVEDP and EF were normal.  Clopidogrel was resumed, as it was previously stopped a few months prior to his presentation due to recurrent epistaxis.  Echo in 03/2022 showed normal LV systolic function with no significant valvular abnormalities.  He was last seen in the office in 09/2022 and was taking a half dose of clopidogrel every other day.  He was admitted to the hospital in 10/2022 with acute on chronic hypoxic respiratory failure secondary to RSV and COPD exacerbation with underlying pulmonary fibrosis.  Workup was also notable for an incidental finding of pulmonary nodule with subsequent PET scan showing no FDG uptake with the appearance of scarring change  noted.  Remaining findings included severe lung disease, advanced vascular disease for age, borderline mediastinal and hilar lymph nodes that were not hypermetabolic and likely reactive due to severe underlying lung disease, and a 3.5 x 3.3 cm infrarenal abdominal aortic aneurysm.  He was last seen in the office in 06/2023 and was without symptoms of angina or cardiac decompensation.  He did feel like his chronic dyspnea had progressed some.  He continued to take a half tab of clopidogrel every other day with preference to continue her current dose/regimen.  In the setting of his chronic dyspnea progression, he underwent echo in 07/2023 that showed an EF of 60 to 65%, no regional wall motion abnormalities, grade 1 diastolic dysfunction, normal RV systolic function imaging the cavity size, trivial mitral regurgitation, aortic valve sclerosis without evidence of stenosis, and an estimated right atrial pressure of 3 mmHg.  He comes company by his wife today and notes an approximate 1 week history of epigastric discomfort that is sharp that waxes and wanes.  Discomfort is not exacerbated by exertion.  At times exertion improves with belching.  He also notes some discomfort along his generalized abdomen to pressure, such as wearing tight fitting pants.  No frank chest pain, dyspnea, dizziness, presyncope, or syncope.  No nausea, vomiting, or diarrhea.  No recent strenuous exertion.  Discomfort does not feel similar to his prior angina.  Blood pressure at home has been well-controlled in the low 100s  to 120s systolic.  No falls or symptoms concerning for bleeding.   Labs independently reviewed: 06/2023 - TC 142, TG 108, HDL 52, LDL 70, albumin 3.8, AST/ALT normal 10/2022 - Hgb 13.6, PLT 188, potassium 4.3, BUN 15, serum creatinine 0.71, magnesium 2.1 05/2022 - TSH normal  Past Medical History:  Diagnosis Date   Chronic cough    COPD (chronic obstructive pulmonary disease) (HCC)    Coronary artery disease     Edentulous    Elevated cholesterol    GERD (gastroesophageal reflux disease)    Myocardial infarction (HCC)    Pulmonary fibrosis (HCC)    Restless leg syndrome    Supplemental oxygen dependent     Past Surgical History:  Procedure Laterality Date   ANGIOPLASTY  2010   2 stents placed   BACK SURGERY     cardiac stents     x2   CATARACT EXTRACTION W/PHACO Left 08/20/2023   Procedure: CATARACT EXTRACTION PHACO AND INTRAOCULAR LENS PLACEMENT (IOC) LEFT 16.87 01:21.8;  Surgeon: Estanislado Pandy, MD;  Location: [facility];  Service: Ophthalmology;  Laterality: Left;   CATARACT EXTRACTION W/PHACO Right 09/03/2023   Procedure: CATARACT EXTRACTION PHACO AND INTRAOCULAR LENS PLACEMENT (IOC) RIGHT 19.41 01:30.0;  Surgeon: Estanislado Pandy, MD;  Location: [facility];  Service: Ophthalmology;  Laterality: Right;   COLONOSCOPY     COLONOSCOPY WITH PROPOFOL N/A 03/05/2018   Procedure: COLONOSCOPY WITH PROPOFOL;  Surgeon: Pasty Spillers, MD;  Location: [facility] ENDOSCOPY;  Service: Endoscopy;  Laterality: N/A;   COLONOSCOPY WITH PROPOFOL N/A 01/24/2022   Procedure: COLONOSCOPY WITH PROPOFOL;  Surgeon: Wyline Mood, MD;  Location: [facility] ENDOSCOPY;  Service: Gastroenterology;  Laterality: N/A;   CORONARY ANGIOPLASTY     CORONARY/GRAFT ACUTE MI REVASCULARIZATION N/A 09/30/2019   Procedure: Coronary/Graft Acute MI Revascularization;  Surgeon: Iran Ouch, MD;  Location: [facility] INVASIVE CV LAB;  Service: Cardiovascular;  Laterality: N/A;   LEFT HEART CATH AND CORONARY ANGIOGRAPHY N/A 09/30/2019   Procedure: LEFT HEART CATH AND CORONARY ANGIOGRAPHY;  Surgeon: Iran Ouch, MD;  Location: [facility] INVASIVE CV LAB;  Service: Cardiovascular;  Laterality: N/A;   LEFT HEART CATH AND CORS/GRAFTS ANGIOGRAPHY N/A 10/01/2021   Procedure: LEFT HEART CATH AND CORS/GRAFTS ANGIOGRAPHY;  Surgeon: Yvonne Kendall, MD;  Location: [facility] INVASIVE CV LAB;  Service: Cardiovascular;   Laterality: N/A;    Current Medications: Current Meds  Medication Sig   acetaminophen (TYLENOL) 500 MG tablet Take 1,000 mg by mouth every 8 (eight) hours as needed for moderate pain.   albuterol (PROVENTIL) (2.5 MG/3ML) 0.083% nebulizer solution Take 2.5 mg by nebulization every 6 (six) hours as needed for wheezing.   ALPHA LIPOIC ACID PO Take 500-1,000 mg by mouth daily.   atorvastatin (LIPITOR) 40 MG tablet TAKE 1 TABLET BY MOUTH EVERY DAY   carvedilol (COREG) 3.125 MG tablet TAKE 1 TABLET BY MOUTH TWICE A DAY WITH FOOD   clopidogrel (PLAVIX) 75 MG tablet TAKE 1/2 TABLET (37.5 MG TOTAL) BY MOUTH EVERY OTHER DAY. (Patient taking differently: Take 75 mg by mouth every other day.)   Fluticasone-Umeclidin-Vilant (TRELEGY ELLIPTA) 100-62.5-25 MCG/ACT AEPB Inhale 1 puff into the lungs daily.   nitroGLYCERIN (NITROSTAT) 0.4 MG SL tablet Place 1 tablet (0.4 mg total) under the tongue every 5 (five) minutes x 3 doses as needed for chest pain.   OXYGEN Inhale 4 L into the lungs continuous.   pantoprazole (PROTONIX) 40 MG tablet Take 40 mg by mouth daily.   Pirfenidone (ESBRIET) 267  MG CAPS Take 3 capsules by mouth 3 (three) times daily.   predniSONE (DELTASONE) 10 MG tablet Take 10 mg by mouth daily.   roflumilast (DALIRESP) 500 MCG TABS tablet Take 1 tablet by mouth daily. Every other day   Treprostinil (TYVASO STARTER KIT) 0.6 MG/ML SOLN Inhale 18 mcg into the lungs 4 (four) times daily.   triamcinolone ointment (KENALOG) 0.1 % APPLY TO ITCHY AREAS AT NECK AND BODY TWICE A DAY FOR UP TO 2 WEEKS. AVOID APPLYING TO FACE, GROIN, AND AXILLA. USE AS DIRECTED. LONG-TERM USE CAN CAUSE THINNING OF THE SKIN.    Allergies:   Ace inhibitors and Cinnamon   Social History   Socioeconomic History   Marital status: Married    Spouse name: Not on file   Number of children: Not on file   Years of education: Not on file   Highest education level: Not on file  Occupational History   Not on file  Tobacco  Use   Smoking status: Former    Current packs/day: 0.00    Average packs/day: 2.0 packs/day for 40.0 years (80.0 ttl pk-yrs)    Types: Cigarettes    Start date: 43    Quit date: 2013    Years since quitting: 11.9   Smokeless tobacco: Never  Vaping Use   Vaping status: Never Used  Substance and Sexual Activity   Alcohol use: Not Currently    Comment: occassionally - 1 beer/month or less   Drug use: Not Currently   Sexual activity: Not Currently  Other Topics Concern   Not on file  Social History Narrative   Lives locally with wife.  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   Social Drivers of Corporate investment banker Strain: Not on file  Food Insecurity: No Food Insecurity (10/25/2022)   Hunger Vital Sign    Worried About Running Out of Food in the Last Year: Never true    Ran Out of Food in the Last Year: Never true  Transportation Needs: No Transportation Needs (10/25/2022)   PRAPARE - Administrator, Civil Service (Medical): No    Lack of Transportation (Non-Medical): No  Physical Activity: Not on file  Stress: Not on file  Social Connections: Not on file     Family History:  The patient's family history includes Heart disease in his father and mother.  ROS:   12-point review of systems is negative unless otherwise noted in the HPI.   EKGs/Labs/Other Studies Reviewed:    Studies reviewed were summarized above. The additional studies were reviewed today:  2D echo 07/30/2023: 1. Left ventricular ejection fraction, by estimation, is 60 to 65%. The  left ventricle has normal function. The left ventricle has no regional  wall motion abnormalities. Left ventricular diastolic parameters are  consistent with Grade I diastolic  dysfunction (impaired relaxation).   2. Right ventricular systolic function is normal. The right ventricular  size is normal.   3. The mitral valve is normal in structure. Trivial mitral valve   regurgitation.   4. The aortic valve is tricuspid. Aortic valve regurgitation is not  visualized. Aortic valve sclerosis is present, with no evidence of aortic  valve stenosis.   5. The inferior vena cava is normal in size with greater than 50%  respiratory variability, suggesting right atrial pressure of 3 mmHg.  __________  2D echo 04/16/2022: 1. Left ventricular ejection fraction, by estimation, is 55 to 60%. The  left ventricle  has normal function. The left ventricle has no regional  wall motion abnormalities. Left ventricular diastolic parameters are  consistent with Grade I diastolic  dysfunction (impaired relaxation).   2. Right ventricular systolic function is normal. The right ventricular  size is normal.   3. The mitral valve is normal in structure. No evidence of mitral valve  regurgitation.   4. The aortic valve is tricuspid. Aortic valve regurgitation is not  visualized.   5. The inferior vena cava is normal in size with greater than 50%  respiratory variability, suggesting right atrial pressure of 3 mmHg.  __________   LHC 10/01/2021: Conclusions: Nonobstructive coronary artery disease with patent stents in the proximal LAD and distal RCA/RPDA.  No culprit lesion identified to explain patient's chest pain and elevated troponin (MINOCA). Preserved left ventricular systolic function with focal apical akinesis.  LVEF 55-65% with normal filling pressure. Tortuous right radial artery with vasospasm precluding sheath placement.  Consider alternative access for further catheterizations.   Recommendations: Dual antiplatelet therapy with aspirin and clopidogrel for up to 12 months, as tolerated (patient previously had to discontinue clopidogrel due to epistaxis). Aggressive secondary prevention of coronary artery disease. Escalate antianginal therapy with addition of isosorbide mononitrate 15 mg daily. Anticipate discharge home tomorrow if no further chest pain or post  catheterization complications. ____________   LHC 09/30/2019: Mid Cx lesion is 30% stenosed. Prox LAD lesion is 80% stenosed. Post intervention, there is a 0% residual stenosis. A drug-eluting stent was successfully placed using a STENT RESOLUTE ONYX 3.5X12. 1st Diag lesion is 40% stenosed. RPDA lesion is 10% stenosed. Dist RCA lesion is 10% stenosed. Prox RCA to Mid RCA lesion is 30% stenosed. The left ventricular systolic function is normal. LV end diastolic pressure is normal. Dist LAD lesion is 100% stenosed.   1.  Significant underlying two-vessel coronary artery disease with patent distal RCA stent with no significant restenosis.  Occluded apical LAD is likely the culprit for ST elevation myocardial infarction.  In addition, there is an 80% eccentric complex plaque in the proximal LAD which likely was responsible for distal embolization. 2.  Normal LV systolic function with severe apical hypokinesis.  LVEDP was normal at 10 mmHg. 3.  Successful direct stenting of the proximal LAD and establishing flow in the apical LAD just by wiring the lesion and giving antiplatelet and anticoagulant medications.   Recommendations: Continue dual antiplatelet therapy with aspirin and ticagrelor for at least 1 year. Aggressive treatment of risk factors. Lovenox for DVT prophylaxis. Treatment of Covid per internal medicine.   EKG:  EKG is ordered today.  The EKG ordered today demonstrates NSR, 61 bpm, no acute st/t changes  Recent Labs: 10/24/2022: B Natriuretic Peptide 192.2 10/28/2022: Magnesium 2.1 10/30/2022: BUN 15; Creatinine, Ser 0.71; Hemoglobin 13.6; Platelets 188; Potassium 4.3; Sodium 136 07/09/2023: ALT 28  Recent Lipid Panel    Component Value Date/Time   CHOL 142 07/09/2023 1019   TRIG 108 07/09/2023 1019   HDL 52 07/09/2023 1019   CHOLHDL 2.7 07/09/2023 1019   CHOLHDL 2.6 10/01/2021 0422   VLDL 9 10/01/2021 0422   LDLCALC 70 07/09/2023 1019   LDLCALC 63 08/31/2020 1433     PHYSICAL EXAM:    VS:  BP 120/78 (BP Location: Left Arm, Patient Position: Sitting)   Pulse 61   Ht 6\' 2"  (1.88 m)   Wt 172 lb 9.6 oz (78.3 kg)   SpO2 95%   BMI 22.16 kg/m   BMI: Body mass index is 22.16  kg/m.  Physical Exam Vitals reviewed.  Constitutional:      Appearance: He is well-developed.  HENT:     Head: Normocephalic and atraumatic.  Eyes:     General:        Right eye: No discharge.        Left eye: No discharge.  Neck:     Vascular: No JVD.  Cardiovascular:     Rate and Rhythm: Normal rate and regular rhythm.     Heart sounds: Normal heart sounds, S1 normal and S2 normal. Heart sounds not distant. No midsystolic click and no opening snap. No murmur heard.    No friction rub.  Pulmonary:     Effort: Pulmonary effort is normal. No respiratory distress.     Breath sounds: Normal breath sounds. No decreased breath sounds, wheezing or rales.  Chest:     Chest wall: No tenderness.  Abdominal:     General: Bowel sounds are normal. There is no distension.     Palpations: Abdomen is soft.     Tenderness: There is abdominal tenderness. There is no right CVA tenderness, left CVA tenderness, guarding or rebound. Negative signs include Murphy's sign and McBurney's sign.     Comments: Mild generalized tenderness of the abdomen.  Abdominal aortic pulse palpable.  Musculoskeletal:     Cervical back: Normal range of motion.     Right lower leg: No edema.     Left lower leg: No edema.  Skin:    General: Skin is warm and dry.     Nails: There is no clubbing.  Neurological:     Mental Status: He is alert and oriented to person, place, and time.  Psychiatric:        Speech: Speech normal.        Behavior: Behavior normal.        Thought Content: Thought content normal.        Judgment: Judgment normal.     Wt Readings from Last 3 Encounters:  10/02/23 172 lb 9.6 oz (78.3 kg)  09/03/23 177 lb 9.6 oz (80.6 kg)  08/20/23 176 lb 9.6 oz (80.1 kg)     ASSESSMENT &  PLAN:   Abdominal pain with infrarenal AAA: Incidentally noted on PET scan in 10/2022, measuring 3.5 x 3.3 cm.  Hemodynamically stable.  Discomfort is actually improved somewhat today.  The symptoms are overall atypical for ACS and enlargement of AAA given that they improve with belching and with superficial discomfort to palpation.  Schedule stat CTA chest, abdomen, pelvis.  EKG without acute ischemic changes.  Low suspicion for ACS.  If his CTA is stable, nonacute recommend he follow-up with PCP for ongoing evaluation of abdominal pain.  CAD involving the native coronary arteries without angina: He is without symptoms of angina or cardiac decompensation.  Abdominal pain does not feel similar to prior angina.  Continue aggressive risk factor modification and secondary prevention including half dose of clopidogrel every other day at his preference.  HTN: Blood pressure is well-controlled in the office today.  Remains on carvedilol 3.125 mg twice daily.  HLD: LDL 70 in 06/2023 with normal AST/ALT at that time.  He remains on atorvastatin 40 mg.  Chronic hypoxic respiratory failure with ILD/pulmonary nodule: Follow-up with pulmonology as directed.    Disposition: F/u with Dr. Kirke Corin or an APP after CTA aorta.   Medication Adjustments/Labs and Tests Ordered: Current medicines are reviewed at length with the patient today.  Concerns regarding medicines are outlined above.  Medication changes, Labs and Tests ordered today are summarized above and listed in the Patient Instructions accessible in Encounters.   Signed, Eula Listen, PA-C 10/02/2023 4:14 PM     [facility] [place] [address] [place], [place] [address] [phone]

## 2023-10-02 ENCOUNTER — Ambulatory Visit: Payer: Medicare Other | Attending: Physician Assistant | Admitting: Physician Assistant

## 2023-10-02 ENCOUNTER — Encounter: Payer: Self-pay | Admitting: Physician Assistant

## 2023-10-02 ENCOUNTER — Ambulatory Visit: Admission: RE | Admit: 2023-10-02 | Payer: Medicare Other | Source: Ambulatory Visit

## 2023-10-02 ENCOUNTER — Ambulatory Visit
Admission: RE | Admit: 2023-10-02 | Discharge: 2023-10-02 | Disposition: A | Discharge status: Home / Self Care | Payer: Medicare Other | Source: Ambulatory Visit | Attending: Physician Assistant | Admitting: Physician Assistant

## 2023-10-02 VITALS — BP 120/78 | HR 61 | Ht 74.0 in | Wt 172.6 lb

## 2023-10-02 DIAGNOSIS — J849 Interstitial pulmonary disease, unspecified: Secondary | ICD-10-CM

## 2023-10-02 DIAGNOSIS — I714 Abdominal aortic aneurysm, without rupture, unspecified: Secondary | ICD-10-CM

## 2023-10-02 DIAGNOSIS — R1013 Epigastric pain: Secondary | ICD-10-CM | POA: Diagnosis not present

## 2023-10-02 DIAGNOSIS — I2510 Atherosclerotic heart disease of native coronary artery without angina pectoris: Secondary | ICD-10-CM

## 2023-10-02 DIAGNOSIS — I10 Essential (primary) hypertension: Secondary | ICD-10-CM

## 2023-10-02 DIAGNOSIS — E785 Hyperlipidemia, unspecified: Secondary | ICD-10-CM

## 2023-10-02 DIAGNOSIS — J9611 Chronic respiratory failure with hypoxia: Secondary | ICD-10-CM

## 2023-10-02 MED ORDER — IOHEXOL 350 MG/ML SOLN
100.0000 mL | Freq: Once | INTRAVENOUS | Status: AC | PRN
  Administered 2023-10-02: 100 mL via INTRAVENOUS

## 2023-10-02 NOTE — Patient Instructions (Signed)
Medication Instructions:  Your Physician recommend you continue on your current medication as directed.    *If you need a refill on your cardiac medications before your next appointment, please call your pharmacy*   Lab Work: None ordered at this time    Testing/Procedures:   Your CTA Aorta will be at:    [facility] [address] [place], [place] [address] [phone]      Follow-Up: At [facility], you and your health needs are our priority.  As part of our continuing mission to provide you with exceptional heart care, we have created designated Provider Care Teams.  These Care Teams include your primary Cardiologist (physician) and Advanced Practice Providers (APPs -  Physician Assistants and Nurse Practitioners) who all work together to provide you with the care you need, when you need it.  We recommend signing up for the patient portal called "MyChart".  Sign up information is provided on this After Visit Summary.  MyChart is used to connect with patients for Virtual Visits (Telemedicine).  Patients are able to view lab/test results, encounter notes, upcoming appointments, etc.  Non-urgent messages can be sent to your provider as well.   To learn more about what you can do with MyChart, go to [email or web address].    Your next appointment:   1 month(s)  Provider:   You may see Lorine Bears, MD or one of the following Advanced Practice Providers on your designated Care Team:   Nicolasa Ducking, NP Eula Listen, PA-C Cadence Fransico Michael, PA-C Charlsie Quest, NP Carlos Levering, NP

## 2023-10-05 ENCOUNTER — Ambulatory Visit: Payer: Medicare Other | Attending: Physician Assistant

## 2023-10-05 ENCOUNTER — Other Ambulatory Visit: Payer: Self-pay | Admitting: Emergency Medicine

## 2023-10-05 ENCOUNTER — Telehealth: Payer: Self-pay | Admitting: Cardiovascular Disease

## 2023-10-05 DIAGNOSIS — I714 Abdominal aortic aneurysm, without rupture, unspecified: Secondary | ICD-10-CM

## 2023-10-05 NOTE — Telephone Encounter (Signed)
Caller (Diane) is reporting stat results.

## 2023-10-05 NOTE — Telephone Encounter (Signed)
STAT call received for pt's result's of "negative"   Stat order placed again for DISSECTION CTA scan that did NOT get added to Friday's scan - modified to omit the chest portion of CTA  Call to pt and message left on answering service on his cell for CTA chest results and need for CTA Abdominal/pelvis, call to pt's wife, per DPR, but unable to leave message

## 2023-11-01 ENCOUNTER — Other Ambulatory Visit: Payer: Self-pay | Admitting: Cardiovascular Disease

## 2023-11-04 ENCOUNTER — Ambulatory Visit: Payer: Medicare Other | Attending: Physician Assistant

## 2023-11-04 DIAGNOSIS — I7143 Infrarenal abdominal aortic aneurysm, without rupture: Secondary | ICD-10-CM

## 2023-11-06 ENCOUNTER — Other Ambulatory Visit: Payer: Self-pay | Admitting: Emergency Medicine

## 2023-11-06 DIAGNOSIS — I714 Abdominal aortic aneurysm, without rupture, unspecified: Secondary | ICD-10-CM

## 2023-11-09 ENCOUNTER — Telehealth: Payer: Self-pay | Admitting: Cardiovascular Disease

## 2023-11-09 NOTE — Telephone Encounter (Signed)
Calling to see if appt for tomorrow is still needed being that the nurse went over their results. Please advise

## 2023-11-10 ENCOUNTER — Ambulatory Visit: Payer: Medicare Other | Attending: Physician Assistant | Admitting: Physician Assistant

## 2023-11-10 ENCOUNTER — Encounter: Payer: Self-pay | Admitting: Physician Assistant

## 2023-11-10 VITALS — BP 142/80 | HR 70 | Ht 74.0 in | Wt 178.4 lb

## 2023-11-10 DIAGNOSIS — E785 Hyperlipidemia, unspecified: Secondary | ICD-10-CM

## 2023-11-10 DIAGNOSIS — I2510 Atherosclerotic heart disease of native coronary artery without angina pectoris: Secondary | ICD-10-CM

## 2023-11-10 DIAGNOSIS — I7143 Infrarenal abdominal aortic aneurysm, without rupture: Secondary | ICD-10-CM | POA: Diagnosis not present

## 2023-11-10 DIAGNOSIS — R911 Solitary pulmonary nodule: Secondary | ICD-10-CM

## 2023-11-10 DIAGNOSIS — I10 Essential (primary) hypertension: Secondary | ICD-10-CM | POA: Diagnosis not present

## 2023-11-10 DIAGNOSIS — J9611 Chronic respiratory failure with hypoxia: Secondary | ICD-10-CM

## 2023-11-10 DIAGNOSIS — J849 Interstitial pulmonary disease, unspecified: Secondary | ICD-10-CM

## 2023-11-10 MED ORDER — CARVEDILOL 6.25 MG PO TABS: 6.2500 mg | ORAL_TABLET | Freq: Two times a day (BID) | ORAL | 3 refills | Status: DC

## 2023-11-10 MED ORDER — NITROGLYCERIN 0.4 MG SL SUBL: 0.4000 mg | SUBLINGUAL_TABLET | SUBLINGUAL | 3 refills | Status: AC | PRN

## 2023-11-10 NOTE — Telephone Encounter (Signed)
Patient evaluated in the office today. 

## 2023-11-10 NOTE — Patient Instructions (Signed)
Medication Instructions:  Your physician recommends the following medication changes.  INCREASE: Coreg 6.25 mg twice daily   *If you need a refill on your cardiac medications before your next appointment, please call your pharmacy*   Lab Work: None ordered at this time  If you have labs (blood work) drawn today and your tests are completely normal, you will receive your results only by: MyChart Message (if you have MyChart) OR A paper copy in the mail If you have any lab test that is abnormal or we need to change your treatment, we will call you to review the results.   Follow-Up: At [facility], you and your health needs are our priority.  As part of our continuing mission to provide you with exceptional heart care, we have created designated Provider Care Teams.  These Care Teams include your primary Cardiologist (physician) and Advanced Practice Providers (APPs -  Physician Assistants and Nurse Practitioners) who all work together to provide you with the care you need, when you need it.  We recommend signing up for the patient portal called "MyChart".  Sign up information is provided on this After Visit Summary.  MyChart is used to connect with patients for Virtual Visits (Telemedicine).  Patients are able to view lab/test results, encounter notes, upcoming appointments, etc.  Non-urgent messages can be sent to your provider as well.   To learn more about what you can do with MyChart, go to [email or web address].    Your next appointment:   6 month(s)  Provider:   You may see Lorine Bears, MD or one of the following Advanced Practice Providers on your designated Care Team:   Eula Listen, [place]

## 2023-11-10 NOTE — Telephone Encounter (Signed)
Pt called three time for advice on cancelling today's appt.  No answer and can't leave message

## 2023-11-10 NOTE — Progress Notes (Signed)
Cardiology Office Note    Date:  11/10/2023   ID:  Dillon [name], Dillon [name] August 02, 1956, MRN [id number]  PCP:  Corky Downs, MD  Cardiologist:  Lorine Bears, MD  Electrophysiologist:  None   Chief Complaint: Follow up  History of Present Illness:   Dillon [name] is a 68 y.o. male with history of CAD with ST elevation MI in 09/2019 status post PCI/DES to the LAD, chronic hypoxic respiratory failure on 4 L supplemental oxygen with interstitial pneumonitis, 3.2 cm infrarenal AAA by ultrasound in 10/2023, HLD, and prior tobacco use who presents for follow-up of AAA ultrasound.   He was admitted to the hospital in 09/2019 with an ST elevation MI in the setting of COVID infection.  LHC showed an occluded apical LAD supplying the distal inferior wall, which was the culprit.  There was also 80% eccentric complex plaque rupture in the proximal LAD, which was felt to be responsible for the distal embolization.  RCA stent was patent.  He underwent successful PCI/DES to the proximal LAD.  The flow to the distal LAD was restored just by wiring of the occlusion site.  EF was normal.  He presented to the hospital in 09/2021 with an NSTEMI with a peak troponin of 3800.  LHC showed patent stents in the proximal LAD and distal RCA/RPDA.  No culprit was identified.  LVEDP and EF were normal.  Clopidogrel was resumed, as it was previously stopped a few months prior to his presentation due to recurrent epistaxis.  Echo in 03/2022 showed normal LV systolic function with no significant valvular abnormalities.  He was last seen in the office in 09/2022 and was taking a half dose of clopidogrel every other day.  He was admitted to the hospital in 10/2022 with acute on chronic hypoxic respiratory failure secondary to RSV and COPD exacerbation with underlying pulmonary fibrosis.  Workup was also notable for an incidental finding of pulmonary nodule with subsequent PET scan showing no FDG uptake with the appearance of scarring  change noted.  Remaining findings included severe lung disease, advanced vascular disease for age, borderline mediastinal and hilar lymph nodes that were not hypermetabolic and likely reactive due to severe underlying lung disease, and a 3.5 x 3.3 cm infrarenal abdominal aortic aneurysm.  Echo in 07/2023 showed an EF of 60 to 65%, no regional wall motion abnormalities, grade 1 diastolic dysfunction, normal RV systolic function imaging the cavity size, trivial mitral regurgitation, aortic valve sclerosis without evidence of stenosis, and an estimated right atrial pressure of 3 mmHg.  He was seen in the office in 09/2023 noting a 1 week history of abdominal discomfort that was sharp and waxed and waned.  Symptoms were not consistent with angina or cardiac decompensation.  He reported he was anxious about his AAA.  CTA of the chest, abdomen, and pelvis was recommended.  However, there was confusion with radiology and CTA chest/aorta was performed.  This was negative for acute aortic dissection or aneurysm in the chest.  Emphysema and fibrotic changes were noted in the lung fields.  We were unsuccessful in getting in touch with the patient to discuss recommended CTA of the abdomen/pelvis.  He subsequently underwent AAA ultrasound on 11/03/2022 that showed a largest aortic measurement of 3.2 cm.  He comes in doing well from a cardiac perspective and is without symptoms of angina or cardiac decompensation.  Chronic dyspnea is stable.  No further abdominal discomfort.  He does not wish to pursue CTA of the  abdomen/pelvis.  No dizziness, presyncope, or syncope.  Patient's wife indicates the patient's blood pressure is largely in the 120s to 130s systolic with a rare reading in the 1 teens or 150s systolic.  No falls or symptoms concerning for bleeding.  He does request a refill of SL NTG, has not needed any in years.   Labs independently reviewed: 06/2023 - TC 142, TG 108, HDL 52, LDL 70, albumin 3.8, AST/ALT  normal 10/2022 - Hgb 13.6, PLT 188, potassium 4.3, BUN 15, serum creatinine 0.71, magnesium 2.1 05/2022 - TSH normal  Past Medical History:  Diagnosis Date   Chronic cough    COPD (chronic obstructive pulmonary disease) (HCC)    Coronary artery disease    Edentulous    Elevated cholesterol    GERD (gastroesophageal reflux disease)    Myocardial infarction (HCC)    Pulmonary fibrosis (HCC)    Restless leg syndrome    Supplemental oxygen dependent     Past Surgical History:  Procedure Laterality Date   ANGIOPLASTY  2010   2 stents placed   BACK SURGERY     cardiac stents     x2   CATARACT EXTRACTION W/PHACO Left 08/20/2023   Procedure: CATARACT EXTRACTION PHACO AND INTRAOCULAR LENS PLACEMENT (IOC) LEFT 16.87 01:21.8;  Surgeon: Estanislado Pandy, MD;  Location: [facility];  Service: Ophthalmology;  Laterality: Left;   CATARACT EXTRACTION W/PHACO Right 09/03/2023   Procedure: CATARACT EXTRACTION PHACO AND INTRAOCULAR LENS PLACEMENT (IOC) RIGHT 19.41 01:30.0;  Surgeon: Estanislado Pandy, MD;  Location: [facility];  Service: Ophthalmology;  Laterality: Right;   COLONOSCOPY     COLONOSCOPY WITH PROPOFOL N/A 03/05/2018   Procedure: COLONOSCOPY WITH PROPOFOL;  Surgeon: Pasty Spillers, MD;  Location: [facility] ENDOSCOPY;  Service: Endoscopy;  Laterality: N/A;   COLONOSCOPY WITH PROPOFOL N/A 01/24/2022   Procedure: COLONOSCOPY WITH PROPOFOL;  Surgeon: Wyline Mood, MD;  Location: [facility]COPY;  Service: Gastroenterology;  Laterality: N/A;   CORONARY ANGIOPLASTY     CORONARY/GRAFT ACUTE MI REVASCULARIZATION N/A 09/30/2019   Procedure: Coronary/Graft Acute MI Revascularization;  Surgeon: Iran Ouch, MD;  Location: [facility] INVASIVE CV LAB;  Service: Cardiovascular;  Laterality: N/A;   LEFT HEART CATH AND CORONARY ANGIOGRAPHY N/A 09/30/2019   Procedure: LEFT HEART CATH AND CORONARY ANGIOGRAPHY;  Surgeon: Iran Ouch, MD;  Location: [facility] INVAS[facility] CV LAB;   Service: Cardiovascular;  Laterality: N/A;   LEFT HEART CATH AND CORS/GRAFTS ANGIOGRAPHY N/A 10/01/2021   Procedure: LEFT HEART CATH AND CORS/GRAFTS ANGIOGRAPHY;  Surgeon: Yvonne Kendall, MD;  Location: [facility] INVAS[facility] CV LAB;  Service: Cardiovascular;  Laterality: N/A;    Current Medications: Current Meds  Medication Sig   acetaminophen (TYLENOL) 500 MG tablet Take 1,000 mg by mouth every 8 (eight) hours as needed for moderate pain.   albuterol (PROVENTIL) (2.5 MG/3ML) 0.083% nebulizer solution SMARTSIG:3 Milliliter(s) Via Nebulizer Every 6 Hours PRN   ALPHA LIPOIC ACID PO Take 500-1,000 mg by mouth daily.   atorvastatin (LIPITOR) 40 MG tablet TAKE 1 TABLET BY MOUTH EVERY DAY   clopidogrel (PLAVIX) 75 MG tablet TAKE 1/2 TABLET (37.5 MG TOTAL) BY MOUTH EVERY OTHER DAY.   Fluticasone-Umeclidin-Vilant (TRELEGY ELLIPTA) 100-62.5-25 MCG/ACT AEPB Inhale 1 puff into the lungs daily.   OXYGEN Inhale 4 L into the lungs continuous.   pantoprazole (PROTONIX) 40 MG tablet Take 40 mg by mouth daily.   Pirfenidone (ESBRIET) 267 MG CAPS Take 3 capsules by mouth 3 (three) times daily.   predniSONE (DELTASONE) 20  MG tablet Take 20 mg by mouth daily.   roflumilast (DALIRESP) 500 MCG TABS tablet Take 1 tablet by mouth daily. Every other day   Treprostinil (TYVASO STARTER KIT) 0.6 MG/ML SOLN Inhale 18 mcg into the lungs 4 (four) times daily.   triamcinolone ointment (KENALOG) 0.1 % APPLY TO ITCHY AREAS AT NECK AND BODY TWICE A DAY FOR UP TO 2 WEEKS. AVOID APPLYING TO FACE, GROIN, AND AXILLA. USE AS DIRECTED. LONG-TERM USE CAN CAUSE THINNING OF THE SKIN.   [DISCONTINUED] carvedilol (COREG) 3.125 MG tablet TAKE 1 TABLET BY MOUTH TWICE A DAY WITH FOOD   [DISCONTINUED] nitroGLYCERIN (NITROSTAT) 0.4 MG SL tablet Place 1 tablet (0.4 mg total) under the tongue every 5 (five) minutes x 3 doses as needed for chest pain.   [DISCONTINUED] predniSONE (DELTASONE) 10 MG tablet Take 20 mg by mouth daily.    Allergies:   Ace  inhibitors and Cinnamon   Social History   Socioeconomic History   Marital status: Married    Spouse name: Not on file   Number of children: Not on file   Years of education: Not on file   Highest education level: Not on file  Occupational History   Not on file  Tobacco Use   Smoking status: Former    Current packs/day: 0.00    Average packs/day: 2.0 packs/day for 40.0 years (80.0 ttl pk-yrs)    Types: Cigarettes    Start date: 47    Quit date: 2013    Years since quitting: 12.0   Smokeless tobacco: Never  Vaping Use   Vaping status: Never Used  Substance and Sexual Activity   Alcohol use: Not Currently    Comment: occassionally - 1 beer/month or less   Drug use: Not Currently   Sexual activity: Not Currently  Other Topics Concern   Not on file  Social History Narrative   Lives locally with wife.  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   Social Drivers of Corporate investment banker Strain: Not on file  Food Insecurity: No Food Insecurity (10/25/2022)   Hunger Vital Sign    Worried About Running Out of Food in the Last Year: Never true    Ran Out of Food in the Last Year: Never true  Transportation Needs: No Transportation Needs (10/25/2022)   PRAPARE - Administrator, Civil Service (Medical): No    Lack of Transportation (Non-Medical): No  Physical Activity: Not on file  Stress: Not on file  Social Connections: Not on file     Family History:  The patient's family history includes Heart disease in his father and mother.  ROS:   12-point review of systems is negative unless otherwise noted in the HPI.   EKGs/Labs/Other Studies Reviewed:    Studies reviewed were summarized above. The additional studies were reviewed today:  AAA ultrasound 11/04/2023: Summary:  Abdominal Aorta: There is evidence of abnormal dilatation of the mid  Abdominal aorta. The largest aortic measurement is 3.2 cm. No previous  exam available  for comparison.  __________  2D echo 07/30/2023: 1. Left ventricular ejection fraction, by estimation, is 60 to 65%. The  left ventricle has normal function. The left ventricle has no regional  wall motion abnormalities. Left ventricular diastolic parameters are  consistent with Grade I diastolic  dysfunction (impaired relaxation).   2. Right ventricular systolic function is normal. The right ventricular  size is normal.   3. The mitral valve is  normal in structure. Trivial mitral valve  regurgitation.   4. The aortic valve is tricuspid. Aortic valve regurgitation is not  visualized. Aortic valve sclerosis is present, with no evidence of aortic  valve stenosis.   5. The inferior vena cava is normal in size with greater than 50%  respiratory variability, suggesting right atrial pressure of 3 mmHg.  __________   2D echo 04/16/2022: 1. Left ventricular ejection fraction, by estimation, is 55 to 60%. The  left ventricle has normal function. The left ventricle has no regional  wall motion abnormalities. Left ventricular diastolic parameters are  consistent with Grade I diastolic  dysfunction (impaired relaxation).   2. Right ventricular systolic function is normal. The right ventricular  size is normal.   3. The mitral valve is normal in structure. No evidence of mitral valve  regurgitation.   4. The aortic valve is tricuspid. Aortic valve regurgitation is not  visualized.   5. The inferior vena cava is normal in size with greater than 50%  respiratory variability, suggesting right atrial pressure of 3 mmHg.  __________   LHC 10/01/2021: Conclusions: Nonobstructive coronary artery disease with patent stents in the proximal LAD and distal RCA/RPDA.  No culprit lesion identified to explain patient's chest pain and elevated troponin (MINOCA). Preserved left ventricular systolic function with focal apical akinesis.  LVEF 55-65% with normal filling pressure. Tortuous right radial artery  with vasospasm precluding sheath placement.  Consider alternative access for further catheterizations.   Recommendations: Dual antiplatelet therapy with aspirin and clopidogrel for up to 12 months, as tolerated (patient previously had to discontinue clopidogrel due to epistaxis). Aggressive secondary prevention of coronary artery disease. Escalate antianginal therapy with addition of isosorbide mononitrate 15 mg daily. Anticipate discharge home tomorrow if no further chest pain or post catheterization complications. ____________   LHC 09/30/2019: Mid Cx lesion is 30% stenosed. Prox LAD lesion is 80% stenosed. Post intervention, there is a 0% residual stenosis. A drug-eluting stent was successfully placed using a STENT RESOLUTE ONYX 3.5X12. 1st Diag lesion is 40% stenosed. RPDA lesion is 10% stenosed. Dist RCA lesion is 10% stenosed. Prox RCA to Mid RCA lesion is 30% stenosed. The left ventricular systolic function is normal. LV end diastolic pressure is normal. Dist LAD lesion is 100% stenosed.   1.  Significant underlying two-vessel coronary artery disease with patent distal RCA stent with no significant restenosis.  Occluded apical LAD is likely the culprit for ST elevation myocardial infarction.  In addition, there is an 80% eccentric complex plaque in the proximal LAD which likely was responsible for distal embolization. 2.  Normal LV systolic function with severe apical hypokinesis.  LVEDP was normal at 10 mmHg. 3.  Successful direct stenting of the proximal LAD and establishing flow in the apical LAD just by wiring the lesion and giving antiplatelet and anticoagulant medications.   Recommendations: Continue dual antiplatelet therapy with aspirin and ticagrelor for at least 1 year. Aggressive treatment of risk factors. Lovenox for DVT prophylaxis. Treatment of Covid per internal medicine.   EKG:  EKG is not ordered today.    Recent Labs: 07/09/2023: ALT 28  Recent Lipid  Panel    Component Value Date/Time   CHOL 142 07/09/2023 1019   TRIG 108 07/09/2023 1019   HDL 52 07/09/2023 1019   CHOLHDL 2.7 07/09/2023 1019   CHOLHDL 2.6 10/01/2021 0422   VLDL 9 10/01/2021 0422   LDLCALC 70 07/09/2023 1019   LDLCALC 63 08/31/2020 1433    PHYSICAL EXAM:  VS:  BP (!) 142/80 (BP Location: Left Arm, Patient Position: Sitting, Cuff Size: Normal)   Pulse 70   Ht 6\' 2"  (1.88 m)   Wt 178 lb 6.4 oz (80.9 kg)   SpO2 97%   BMI 22.91 kg/m   BMI: Body mass index is 22.91 kg/m.  Physical Exam Vitals reviewed.  Constitutional:      Appearance: He is well-developed.  HENT:     Head: Normocephalic and atraumatic.  Eyes:     General:        Right eye: No discharge.        Left eye: No discharge.  Neck:     Vascular: No JVD.  Cardiovascular:     Rate and Rhythm: Normal rate and regular rhythm.     Heart sounds: Normal heart sounds, S1 normal and S2 normal. Heart sounds not distant. No midsystolic click and no opening snap. No murmur heard.    No friction rub.  Pulmonary:     Effort: Pulmonary effort is normal. No respiratory distress.     Breath sounds: Normal breath sounds. No decreased breath sounds, wheezing, rhonchi or rales.  Chest:     Chest wall: No tenderness.  Abdominal:     General: There is no distension.  Musculoskeletal:     Cervical back: Normal range of motion.     Right lower leg: No edema.     Left lower leg: No edema.  Skin:    General: Skin is warm and dry.     Nails: There is no clubbing.  Neurological:     Mental Status: He is alert and oriented to person, place, and time.  Psychiatric:        Speech: Speech normal.        Behavior: Behavior normal.        Thought Content: Thought content normal.        Judgment: Judgment normal.     Wt Readings from Last 3 Encounters:  11/10/23 178 lb 6.4 oz (80.9 kg)  10/02/23 172 lb 9.6 oz (78.3 kg)  09/03/23 177 lb 9.6 oz (80.6 kg)     ASSESSMENT & PLAN:   Infrarenal AAA: Stable on  recent ultrasound measuring 3.2 cm.  He declines previously ordered CTA of the/pelvis.  Repeat abdominal ultrasound in 12 months.  Optimal blood pressure control as recommended below.  Recommend continued smoking cessation.  Precautions discussed.  Avoid fluoroquinolone antibiotics.  CAD involving native coronary arteries without angina: He is without symptoms of angina or cardiac decompensation.  Continue aggressive risk factor modification and secondary prevention including half dose of clopidogrel every other day at his preference.  Refill SL NTG at his request.  No indication for further ischemic testing at this time.  HTN: Blood pressure is mildly elevated in the office today 142/80 with an unchanged recheck blood pressure.  Patient's wife reports the patient's blood pressure is largely in the 120s to 130s systolic.  With noted infrarenal AAA, we will titrate carvedilol to 6.25 mg twice daily.  Low-sodium diet encouraged.  HLD: LDL 70 in 06/2020 with normal AST/ALT at that time.  He remains on atorvastatin 40 mg.  Chronic hypoxic respiratory failure with ILD and pulmonary nodule: Follow-up with pulmonology as directed.      Disposition: F/u with Dr. Kirke Corin or an APP in 6 months.   Medication Adjustments/Labs and Tests Ordered: Current medicines are reviewed at length with the patient today.  Concerns regarding medicines are outlined above. Medication changes, Labs  and Tests ordered today are summarized above and listed in the Patient Instructions accessible in Encounters.   Signed, Eula Listen, PA-C 11/10/2023 1:37 PM     [facility] -  [address] [place], [place] [address] [phone]

## 2023-11-16 ENCOUNTER — Emergency Department: Payer: Medicare Other

## 2023-11-16 ENCOUNTER — Inpatient Hospital Stay
Admission: EM | Admit: 2023-11-16 | Discharge: 2023-11-20 | DRG: 189 | Disposition: A | Discharge status: Home / Self Care | Payer: Medicare Other | Attending: Internal Medicine | Admitting: Internal Medicine

## 2023-11-16 ENCOUNTER — Other Ambulatory Visit: Payer: Self-pay

## 2023-11-16 DIAGNOSIS — I272 Pulmonary hypertension, unspecified: Secondary | ICD-10-CM | POA: Insufficient documentation

## 2023-11-16 DIAGNOSIS — J984 Other disorders of lung: Secondary | ICD-10-CM

## 2023-11-16 DIAGNOSIS — Z955 Presence of coronary angioplasty implant and graft: Secondary | ICD-10-CM | POA: Diagnosis not present

## 2023-11-16 DIAGNOSIS — E785 Hyperlipidemia, unspecified: Secondary | ICD-10-CM | POA: Diagnosis not present

## 2023-11-16 DIAGNOSIS — Z91018 Allergy to other foods: Secondary | ICD-10-CM | POA: Diagnosis not present

## 2023-11-16 DIAGNOSIS — I252 Old myocardial infarction: Secondary | ICD-10-CM

## 2023-11-16 DIAGNOSIS — I2510 Atherosclerotic heart disease of native coronary artery without angina pectoris: Secondary | ICD-10-CM | POA: Insufficient documentation

## 2023-11-16 DIAGNOSIS — J441 Chronic obstructive pulmonary disease with (acute) exacerbation: Secondary | ICD-10-CM | POA: Diagnosis present

## 2023-11-16 DIAGNOSIS — G2581 Restless legs syndrome: Secondary | ICD-10-CM | POA: Diagnosis present

## 2023-11-16 DIAGNOSIS — J9601 Acute respiratory failure with hypoxia: Principal | ICD-10-CM | POA: Diagnosis present

## 2023-11-16 DIAGNOSIS — Z87891 Personal history of nicotine dependence: Secondary | ICD-10-CM | POA: Diagnosis not present

## 2023-11-16 DIAGNOSIS — Z7902 Long term (current) use of antithrombotics/antiplatelets: Secondary | ICD-10-CM | POA: Diagnosis not present

## 2023-11-16 DIAGNOSIS — I10 Essential (primary) hypertension: Secondary | ICD-10-CM | POA: Diagnosis present

## 2023-11-16 DIAGNOSIS — J9621 Acute and chronic respiratory failure with hypoxia: Principal | ICD-10-CM | POA: Diagnosis present

## 2023-11-16 DIAGNOSIS — Z8249 Family history of ischemic heart disease and other diseases of the circulatory system: Secondary | ICD-10-CM | POA: Diagnosis not present

## 2023-11-16 DIAGNOSIS — E876 Hypokalemia: Secondary | ICD-10-CM | POA: Diagnosis present

## 2023-11-16 DIAGNOSIS — Z888 Allergy status to other drugs, medicaments and biological substances status: Secondary | ICD-10-CM

## 2023-11-16 DIAGNOSIS — K219 Gastro-esophageal reflux disease without esophagitis: Secondary | ICD-10-CM | POA: Insufficient documentation

## 2023-11-16 DIAGNOSIS — J811 Chronic pulmonary edema: Secondary | ICD-10-CM | POA: Diagnosis not present

## 2023-11-16 DIAGNOSIS — Z79899 Other long term (current) drug therapy: Secondary | ICD-10-CM | POA: Diagnosis not present

## 2023-11-16 DIAGNOSIS — Z7951 Long term (current) use of inhaled steroids: Secondary | ICD-10-CM

## 2023-11-16 DIAGNOSIS — R0602 Shortness of breath: Secondary | ICD-10-CM | POA: Diagnosis present

## 2023-11-16 DIAGNOSIS — Z9981 Dependence on supplemental oxygen: Secondary | ICD-10-CM

## 2023-11-16 DIAGNOSIS — E7800 Pure hypercholesterolemia, unspecified: Secondary | ICD-10-CM | POA: Diagnosis present

## 2023-11-16 DIAGNOSIS — J849 Interstitial pulmonary disease, unspecified: Secondary | ICD-10-CM

## 2023-11-16 DIAGNOSIS — J8410 Pulmonary fibrosis, unspecified: Secondary | ICD-10-CM | POA: Diagnosis present

## 2023-11-16 LAB — CBC WITH DIFFERENTIAL/PLATELET
Abs Immature Granulocytes: 0.1 10*3/uL — ABNORMAL HIGH (ref 0.00–0.07)
Basophils Absolute: 0.1 10*3/uL (ref 0.0–0.1)
Basophils Relative: 1 %
Eosinophils Absolute: 0.1 10*3/uL (ref 0.0–0.5)
Eosinophils Relative: 1 %
HCT: 41.7 % (ref 39.0–52.0)
Hemoglobin: 14.2 g/dL (ref 13.0–17.0)
Immature Granulocytes: 1 %
Lymphocytes Relative: 21 %
Lymphs Abs: 1.7 10*3/uL (ref 0.7–4.0)
MCH: 34.6 pg — ABNORMAL HIGH (ref 26.0–34.0)
MCHC: 34.1 g/dL (ref 30.0–36.0)
MCV: 101.7 fL — ABNORMAL HIGH (ref 80.0–100.0)
Monocytes Absolute: 1 10*3/uL (ref 0.1–1.0)
Monocytes Relative: 12 %
Neutro Abs: 5.1 10*3/uL (ref 1.7–7.7)
Neutrophils Relative %: 64 %
Platelets: 135 10*3/uL — ABNORMAL LOW (ref 150–400)
RBC: 4.1 MIL/uL — ABNORMAL LOW (ref 4.22–5.81)
RDW: 14.3 % (ref 11.5–15.5)
WBC: 8 10*3/uL (ref 4.0–10.5)
nRBC: 0 % (ref 0.0–0.2)

## 2023-11-16 LAB — RESP PANEL BY RT-PCR (RSV, FLU A&B, COVID)  RVPGX2
Influenza A by PCR: NEGATIVE
Influenza B by PCR: NEGATIVE
Resp Syncytial Virus by PCR: NEGATIVE
SARS Coronavirus 2 by RT PCR: NEGATIVE

## 2023-11-16 LAB — COMPREHENSIVE METABOLIC PANEL
ALT: 31 U/L (ref 0–44)
AST: 20 U/L (ref 15–41)
Albumin: 3.4 g/dL — ABNORMAL LOW (ref 3.5–5.0)
Alkaline Phosphatase: 60 U/L (ref 38–126)
Anion gap: 10 (ref 5–15)
BUN: 12 mg/dL (ref 8–23)
CO2: 25 mmol/L (ref 22–32)
Calcium: 8.4 mg/dL — ABNORMAL LOW (ref 8.9–10.3)
Chloride: 108 mmol/L (ref 98–111)
Creatinine, Ser: 0.78 mg/dL (ref 0.61–1.24)
GFR, Estimated: >60 mL/min (ref >60)
Glucose, Bld: 109 mg/dL — ABNORMAL HIGH (ref 70–99)
Potassium: 3 mmol/L — ABNORMAL LOW (ref 3.5–5.1)
Sodium: 143 mmol/L (ref 135–145)
Total Bilirubin: 0.9 mg/dL (ref 0.0–1.2)
Total Protein: 6.6 g/dL (ref 6.5–8.1)

## 2023-11-16 LAB — BLOOD GAS, VENOUS
Acid-Base Excess: 4.2 mmol/L — ABNORMAL HIGH (ref 0.0–2.0)
Bicarbonate: 29.2 mmol/L — ABNORMAL HIGH (ref 20.0–28.0)
Delivery systems: POSITIVE
FIO2: 60 %
O2 Saturation: 65 %
Patient temperature: 37
pCO2, Ven: 44 mm[Hg] (ref 44–60)
pH, Ven: 7.43 (ref 7.25–7.43)
pO2, Ven: 40 mm[Hg] (ref 32–45)

## 2023-11-16 LAB — TROPONIN I (HIGH SENSITIVITY): Troponin I (High Sensitivity): 25 ng/L — ABNORMAL HIGH (ref <18)

## 2023-11-16 MED ORDER — CLOPIDOGREL BISULFATE 75 MG PO TABS
37.5000 mg | ORAL_TABLET | ORAL | Status: DC
  Administered 2023-11-17 – 2023-11-19 (×2): 37.5 mg via ORAL
  Filled 2023-11-16 (×3): qty 1

## 2023-11-16 MED ORDER — GUAIFENESIN ER 600 MG PO TB12
600.0000 mg | ORAL_TABLET | Freq: Two times a day (BID) | ORAL | Status: DC
  Administered 2023-11-16 – 2023-11-20 (×8): 600 mg via ORAL
  Filled 2023-11-16 (×8): qty 1

## 2023-11-16 MED ORDER — NITROGLYCERIN 0.4 MG SL SUBL: 0.4000 mg | SUBLINGUAL_TABLET | SUBLINGUAL | Status: DC | PRN

## 2023-11-16 MED ORDER — ONDANSETRON HCL 4 MG/2ML IJ SOLN: 4.0000 mg | Freq: Four times a day (QID) | INTRAMUSCULAR | Status: DC | PRN

## 2023-11-16 MED ORDER — ACETAMINOPHEN 650 MG RE SUPP
650.0000 mg | Freq: Four times a day (QID) | RECTAL | Status: DC | PRN
Start: 2023-11-16 — End: 2023-11-20

## 2023-11-16 MED ORDER — ROFLUMILAST 500 MCG PO TABS
500.0000 ug | ORAL_TABLET | Freq: Every day | ORAL | Status: DC
  Administered 2023-11-17 – 2023-11-18 (×2): 500 ug via ORAL
  Filled 2023-11-16 (×2): qty 1

## 2023-11-16 MED ORDER — ONDANSETRON HCL 4 MG PO TABS: 4.0000 mg | ORAL_TABLET | Freq: Four times a day (QID) | ORAL | Status: DC | PRN

## 2023-11-16 MED ORDER — ENOXAPARIN SODIUM 40 MG/0.4ML IJ SOSY
40.0000 mg | PREFILLED_SYRINGE | INTRAMUSCULAR | Status: DC
  Administered 2023-11-16 – 2023-11-19 (×4): 40 mg via SUBCUTANEOUS
  Filled 2023-11-16 (×4): qty 0.4

## 2023-11-16 MED ORDER — HYDROCOD POLI-CHLORPHE POLI ER 10-8 MG/5ML PO SUER: 5.0000 mL | Freq: Two times a day (BID) | ORAL | Status: DC | PRN

## 2023-11-16 MED ORDER — SODIUM CHLORIDE 0.9 % IV SOLN
INTRAVENOUS | Status: AC
Start: 2023-11-16 — End: 2023-11-17

## 2023-11-16 MED ORDER — SODIUM CHLORIDE 0.9 % IV SOLN
500.0000 mg | Freq: Once | INTRAVENOUS | Status: AC
  Administered 2023-11-16: 500 mg via INTRAVENOUS
  Filled 2023-11-16: qty 5

## 2023-11-16 MED ORDER — TREPROSTINIL 0.6 MG/ML IN SOLN
18.0000 ug | Freq: Four times a day (QID) | RESPIRATORY_TRACT | Status: DC
Start: 2023-11-17 — End: 2023-11-20
  Administered 2023-11-17 – 2023-11-20 (×12): 18 ug via RESPIRATORY_TRACT
  Filled 2023-11-16: qty 2.9

## 2023-11-16 MED ORDER — IPRATROPIUM-ALBUTEROL 0.5-2.5 (3) MG/3ML IN SOLN
3.0000 mL | Freq: Once | RESPIRATORY_TRACT | Status: AC
  Administered 2023-11-16: 3 mL via RESPIRATORY_TRACT
  Filled 2023-11-16: qty 3

## 2023-11-16 MED ORDER — ACETAMINOPHEN 325 MG PO TABS
650.0000 mg | ORAL_TABLET | Freq: Four times a day (QID) | ORAL | Status: DC | PRN
Start: 2023-11-16 — End: 2023-11-20
  Administered 2023-11-17 – 2023-11-19 (×3): 650 mg via ORAL
  Filled 2023-11-16 (×4): qty 2

## 2023-11-16 MED ORDER — SODIUM CHLORIDE 0.9 % IV SOLN: 1.0000 g | INTRAVENOUS | Status: DC

## 2023-11-16 MED ORDER — OMEGA-3-ACID ETHYL ESTERS 1 G PO CAPS
1000.0000 mg | ORAL_CAPSULE | Freq: Every day | ORAL | Status: DC
  Administered 2023-11-17 – 2023-11-20 (×4): 1000 mg via ORAL
  Filled 2023-11-16 (×4): qty 1

## 2023-11-16 MED ORDER — ATORVASTATIN CALCIUM 20 MG PO TABS
40.0000 mg | ORAL_TABLET | Freq: Every day | ORAL | Status: DC
  Administered 2023-11-17 – 2023-11-20 (×4): 40 mg via ORAL
  Filled 2023-11-16 (×4): qty 2

## 2023-11-16 MED ORDER — MAGNESIUM HYDROXIDE 400 MG/5ML PO SUSP: 30.0000 mL | Freq: Every day | ORAL | Status: DC | PRN

## 2023-11-16 MED ORDER — TRAZODONE HCL 50 MG PO TABS: 25.0000 mg | ORAL_TABLET | Freq: Every evening | ORAL | Status: DC | PRN

## 2023-11-16 MED ORDER — IPRATROPIUM-ALBUTEROL 0.5-2.5 (3) MG/3ML IN SOLN
3.0000 mL | Freq: Four times a day (QID) | RESPIRATORY_TRACT | Status: DC
  Administered 2023-11-17 (×2): 3 mL via RESPIRATORY_TRACT
  Filled 2023-11-16 (×2): qty 3

## 2023-11-16 MED ORDER — CARVEDILOL 6.25 MG PO TABS
6.2500 mg | ORAL_TABLET | Freq: Two times a day (BID) | ORAL | Status: DC
  Administered 2023-11-17 – 2023-11-20 (×7): 6.25 mg via ORAL
  Filled 2023-11-16 (×7): qty 1

## 2023-11-16 MED ORDER — PIRFENIDONE 267 MG PO CAPS: 3.0000 | ORAL_CAPSULE | Freq: Three times a day (TID) | ORAL | Status: DC

## 2023-11-16 MED ORDER — PANTOPRAZOLE SODIUM 40 MG PO TBEC
40.0000 mg | DELAYED_RELEASE_TABLET | Freq: Every day | ORAL | Status: DC
  Administered 2023-11-17 – 2023-11-20 (×4): 40 mg via ORAL
  Filled 2023-11-16 (×4): qty 1

## 2023-11-16 MED ORDER — SODIUM CHLORIDE 0.9 % IV SOLN
2.0000 g | Freq: Once | INTRAVENOUS | Status: AC
  Administered 2023-11-16: 2 g via INTRAVENOUS
  Filled 2023-11-16: qty 20

## 2023-11-16 NOTE — ED Triage Notes (Signed)
BIBA for shob pt has pmh of COPD and pulmonary fibrosis. Pt here on NRB at 15lit with saturation in the high 90's. Pt breathing rapidly, RR 36. PTA pt rc'd 2 duoneb txs and 125 solumedrol.

## 2023-11-16 NOTE — H&P (Incomplete)
[place]   PATIENT NAME: Dillon [name]    MR#:  [id number]  DATE OF BIRTH:  02-01-1956  DATE OF ADMISSION:  11/16/2023  PRIMARY CARE PHYSICIAN: Corky Downs, MD   Patient is coming from: Home  REQUESTING/REFERRING PHYSICIAN: Claudell Kyle, MD  CHIEF COMPLAINT:   Chief Complaint  Patient presents with   Shortness of Breath    HISTORY OF PRESENT ILLNESS:  Dillon [name] is a 68 y.o. Caucasian male with medical history significant for COPD, GERD, pulmonary fibrosis, restless leg syndrome and coronary artery disease, who presented to the emergency room with acute onset of dyspnea, cough with inability to expectorate over the last 4 to 5 days with mild chills without reported fever.  Did not any chest pain or palpitations.  No nausea or vomiting or abdominal pain.  No dysuria, oliguria or hematuria or flank pain.  He was in significant respiratory distress in the ER requiring BiPAP.  ED Course: When he came to the ER, temperature was 99, BP was 143/79 with respiratory to of 21 and otherwise normal vital signs.  Labs revealed hypokalemia 3, albumin of 3.4 with otherwise unremarkable CMP EKG as reviewed by me : EKG showed normal sinus rhythm with a rate of 79 with left axis deviation.  It showed RSR-there is probably normal variant in V1 and V2.Marland Kitchen  VBG showed pH 7.43 and HCO3 29.2 on 60% FiO2 on BiPAP.  I sensitive troponin I was 25 and later 23 CBC showed macrocytosis.  Respiratory panel came back negative. Imaging: Portable chest x-ray showed no acute cardiopulmonary disease.  The patient was given IV Rocephin, Zithromax and DuoNebs.  The patient will be admitted to a progressive unit bed for further evaluation and management. PAST MEDICAL HISTORY:   Past Medical History:  Diagnosis Date   Chronic cough    COPD (chronic obstructive pulmonary disease) (HCC)    Coronary artery disease    Edentulous    Elevated cholesterol    GERD (gastroesophageal reflux disease)    Myocardial  infarction (HCC)    Pulmonary fibrosis (HCC)    Restless leg syndrome    Supplemental oxygen dependent     PAST SURGICAL HISTORY:   Past Surgical History:  Procedure Laterality Date   ANGIOPLASTY  2010   2 stents placed   BACK SURGERY     cardiac stents     x2   CATARACT EXTRACTION W/PHACO Left 08/20/2023   Procedure: CATARACT EXTRACTION PHACO AND INTRAOCULAR LENS PLACEMENT (IOC) LEFT 16.87 01:21.8;  Surgeon: Estanislado Pandy, MD;  Location: [facility];  Service: Ophthalmology;  Laterality: Left;   CATARACT EXTRACTION W/PHACO Right 09/03/2023   Procedure: CATARACT EXTRACTION PHACO AND INTRAOCULAR LENS PLACEMENT (IOC) RIGHT 19.41 01:30.0;  Surgeon: Estanislado Pandy, MD;  Location: [facility];  Service: Ophthalmology;  Laterality: Right;   COLONOSCOPY     COLONOSCOPY WITH PROPOFOL N/A 03/05/2018   Procedure: COLONOSCOPY WITH PROPOFOL;  Surgeon: Pasty Spillers, MD;  Location: [facility] ENDOSCOPY;  Service: Endoscopy;  Laterality: N/A;   COLONOSCOPY WITH PROPOFOL N/A 01/24/2022   Procedure: COLONOSCOPY WITH PROPOFOL;  Surgeon: Wyline Mood, MD;  Location: [facility] END[facility]COPY;  Service: Gastroenterology;  Laterality: N/A;   CORONARY ANGIOPLASTY     CORONARY/GRAFT ACUTE MI REVASCULARIZATION N/A 09/30/2019   Procedure: Coronary/Graft Acute MI Revascularization;  Surgeon: Iran Ouch, MD;  Location: [facility];  Service: Cardiovascular;  Laterality: N/A;   LEFT HEART CATH AND CORONARY ANGIOGRAPHY N/A 09/30/2019  Procedure: LEFT HEART CATH AND CORONARY ANGIOGRAPHY;  Surgeon: Iran Ouch, MD;  Location: [facility];  Service: Cardiovascular;  Laterality: N/A;   LEFT HEART CATH AND CORS/GRAFTS ANGIOGRAPHY N/A 10/01/2021   Procedure: LEFT HEART CATH AND CORS/GRAFTS ANGIOGRAPHY;  Surgeon: Yvonne Kendall, MD;  Location: [facility] CV [facility];  Service: Cardiovascular;  Laterality: N/A;    SOCIAL HISTORY:   Social History   Tobacco Use    Smoking status: Former    Current packs/day: 0.00    Average packs/day: 2.0 packs/day for 40.0 years (80.0 ttl pk-yrs)    Types: Cigarettes    Start date: 32    Quit date: 2013    Years since quitting: 12.0   Smokeless tobacco: Never  Substance Use Topics   Alcohol use: Not Currently    Comment: occassionally - 1 beer/month or less    FAMILY HISTORY:   Family History  Problem Relation Age of Onset   Heart disease Mother    Heart disease Father     DRUG ALLERGIES:   Allergies  Allergen Reactions   Ace Inhibitors Swelling    Causes cough and swelling   Cinnamon Rash    Mouth rash    REVIEW OF SYSTEMS:   ROS As per history of present illness. All pertinent systems were reviewed above. Constitutional, HEENT, cardiovascular, respiratory, GI, GU, musculoskeletal, neuro, psychiatric, endocrine, integumentary and hematologic systems were reviewed and are otherwise negative/unremarkable except for positive findings mentioned above in the HPI.   MEDICATIONS AT HOME:   Prior to Admission medications   Medication Sig Start Date End Date Taking? Authorizing Provider  acetaminophen (TYLENOL) 500 MG tablet Take 1,000 mg by mouth every 8 (eight) hours as needed for moderate pain.    [provider]  albuterol (PROVENTIL) (2.5 MG/3ML) 0.083% nebulizer solution SMARTSIG:3 Milliliter(s) Via Nebulizer Every 6 Hours PRN 11/02/23   [provider]  ALPHA LIPOIC ACID PO Take 500-1,000 mg by mouth daily.    [provider]  atorvastatin (LIPITOR) 40 MG tablet TAKE 1 TABLET BY MOUTH EVERY DAY 07/09/23   Iran Ouch, MD  carvedilol (COREG) 6.25 MG tablet Take 1 tablet (6.25 mg total) by mouth 2 (two) times daily with a meal. 11/10/23   Dunn, Raymon Mutton, PA-C  clopidogrel (PLAVIX) 75 MG tablet TAKE 1/2 TABLET (37.5 MG TOTAL) BY MOUTH EVERY OTHER DAY. 11/02/23   Iran Ouch, MD  Fluticasone-Umeclidin-Vilant (TRELEGY ELLIPTA) 100-62.5-25 MCG/ACT AEPB Inhale 1 puff  into the lungs daily. 10/22/22   Corky Downs, MD  nitroGLYCERIN (NITROSTAT) 0.4 MG SL tablet Place 1 tablet (0.4 mg total) under the tongue every 5 (five) minutes x 3 doses as needed for chest pain. 11/10/23   Dunn, Raymon Mutton, PA-C  OXYGEN Inhale 4 L into the lungs continuous.    [provider]  pantoprazole (PROTONIX) 40 MG tablet Take 40 mg by mouth daily. 05/16/23   [provider]  Pirfenidone (ESBRIET) 267 MG CAPS Take 3 capsules by mouth 3 (three) times daily.    [provider]  predniSONE (DELTASONE) 20 MG tablet Take 20 mg by mouth daily. 11/02/23   [provider]  roflumilast (DALIRESP) 500 MCG TABS tablet Take 1 tablet by mouth daily. Every other day 06/10/23   [provider]  Treprostinil (TYVASO STARTER KIT) 0.6 MG/ML SOLN Inhale 18 mcg into the lungs 4 (four) times daily.    [provider]  triamcinolone ointment (KENALOG) 0.1 % APPLY TO ITCHY AREAS  AT NECK AND BODY TWICE A DAY FOR UP TO 2 WEEKS. AVOID APPLYING TO FACE, GROIN, AND AXILLA. USE AS DIRECTED. LONG-TERM USE CAN CAUSE THINNING OF THE SKIN. 04/07/23   Willeen Niece, MD      VITAL SIGNS:  Blood pressure 119/61, pulse 63, temperature 98.9 F (37.2 C), temperature source Oral, resp. rate 19, SpO2 93%.  PHYSICAL EXAMINATION:  Physical Exam  GENERAL: Acutely ill 68 y.o.-year-old Caucasian male patient lying in the bed with moderate respiratory distress on BiPAP EYES: Pupils equal, round, reactive to light and accommodation. No scleral icterus. Extraocular muscles intact.  HEENT: Head atraumatic, normocephalic. Oropharynx and nasopharynx clear.  NECK:  Supple, no jugular venous distention. No thyroid enlargement, no tenderness.  LUNGS: Diffuse expiratory wheezes with tight expiratory airflow and harsh vesicular breathing.. No use of accessory muscles of respiration.  CARDIOVASCULAR: Regular rate and rhythm, S1, S2 normal. No murmurs, rubs, or gallops.  ABDOMEN: Soft,  nondistended, nontender. Bowel sounds present. No organomegaly or mass.  EXTREMITIES: No pedal edema, cyanosis, or clubbing.  NEUROLOGIC: Cranial nerves II through XII are intact. Muscle strength 5/5 in all extremities. Sensation intact. Gait not checked.  PSYCHIATRIC: The patient is alert and oriented x 3.  Normal affect and good eye contact. SKIN: No obvious rash, lesion, or ulcer.   LABORATORY PANEL:   CBC Recent Labs  Lab 11/16/23 2103  WBC 8.0  HGB 14.2  HCT 41.7  PLT 135*   ------------------------------------------------------------------------------------------------------------------  Chemistries  Recent Labs  Lab 11/16/23 2103  NA 143  K 3.0*  CL 108  CO2 25  GLUCOSE 109*  BUN 12  CREATININE 0.78  CALCIUM 8.4*  AST 20  ALT 31  ALKPHOS 60  BILITOT 0.9   ------------------------------------------------------------------------------------------------------------------  Cardiac Enzymes No results for input(s): "TROPONINI" in the last 168 hours. ------------------------------------------------------------------------------------------------------------------  RADIOLOGY:  DG Chest Portable 1 View Result Date: 11/16/2023 CLINICAL DATA:  Shortness of breath, COPD exacerbation, evaluate for pneumonia with new hypoxia from baseline EXAM: PORTABLE CHEST 1 VIEW COMPARISON:  Chest x-ray 10/24/2022 FINDINGS: The heart and mediastinal contours are unchanged. No focal consolidation. Chronic coarsened interstitial markings with no overt pulmonary edema. No pleural effusion. No pneumothorax. No acute osseous abnormality. IMPRESSION: No active disease in a patient with chronic interstitial lung disease. Electronically Signed   By: Tish Frederickson M.D.   On: 11/16/2023 21:54      IMPRESSION AND PLAN:  Assessment and Plan: * Acute respiratory failure with hypoxia (HCC) - This is clearly secondary to COPD exacerbation. - The patient be admitted to a progressive unit bed. -  We will continue on BiPAP. - Management otherwise as below.  COPD with acute exacerbation (HCC) - We will place the patient IV steroid therapy with IV Solu-Medrol as well as nebulized bronchodilator therapy with duonebs q.i.d. and q.4 hours p.r.n.Marland Kitchen - Mucolytic therapy will be provided with Mucinex and antibiotic therapy with IV Rocephin. - O2 protocol will be followed. - We will hold off long-acting beta agonist.   Essential hypertension - Continue antihypertensive therapy.  Dyslipidemia - We will continue statin therapy.  Coronary artery disease - We will continue statin therapy, beta-blocker therapy and as needed sublingual nitroglycerin as well as and has Plavix  GERD without esophagitis - We will continue PPI therapy.  Pulmonary hypertension (HCC) - We will continue Roflumilast  and Tyvaso.   DVT prophylaxis: Lovenox.  Advanced Care Planning:  Code Status: full code.  Family Communication:  The plan of care was discussed in details  with the patient (and family). I answered all questions. The patient agreed to proceed with the above mentioned plan. Further management will depend upon hospital course. Disposition Plan: Back to previous home environment Consults called: none.  All the records are reviewed and case discussed with ED provider.  Status is: Inpatient   At the time of the admission, it appears that the appropriate admission status for this patient is inpatient.  This is judged to be reasonable and necessary in order to provide the required intensity of service to ensure the patient's safety given the presenting symptoms, physical exam findings and initial radiographic and laboratory data in the context of comorbid conditions.  The patient requires inpatient status due to high intensity of service, high risk of further deterioration and high frequency of surveillance required.  I certify that at the time of admission, it is my clinical judgment that the patient will  require inpatient hospital care extending more than 2 midnights.                            Dispo: The patient is from: Home              Anticipated d/c is to: Home              Patient currently is not medically stable to d/c.              Difficult to place patient: No  Authorized and performed by: Valente Jamarrion, MD Total critical care time:   55     minutes. Due to a high probability of clinically significant, life-threatening deterioration, the patient required my highest level of preparedness to intervene emergently and I personally spent this critical care time directly and personally managing the patient.  This critical care time included obtaining a history, examining the patient, pulse oximetry, ordering and review of studies, arranging urgent treatment with development of management plan, evaluation of patient's response to treatment, frequent reassessment, and discussions with other providers. This critical care time was performed to assess and manage the high probability of imminent, life-threatening deterioration that could result in multiorgan failure.  It was exclusive of separately billable procedures and treating other patients and teaching time.    Hannah Beat M.D on 11/17/2023 at 5:28 AM  [facility]   From 7 PM-7 AM, contact night-coverage [email or web address]  CC: Primary care physician; Corky Downs, MD

## 2023-11-16 NOTE — ED Provider Notes (Signed)
[facility] Provider Note    Event Date/Time   First MD Initiated Contact with Patient 11/16/23 2054     (approximate)   History   Shortness of Breath   HPI Dillon [name] is a 68 y.o. male with history of chronic lung disease, COPD on 4 L baseline presenting today for shortness of breath.  Patient states feeling unwell for the past couple of days with worsening shortness of breath.  It got significantly worse when he was out walking around today.  With EMS he was found to be hypoxic around 70% on his baseline 4 L.  Required nonrebreather to get up into the 90s.  Given 2 DuoNeb treatments and Solu-Medrol.  On arrival here, still short of breath and hypoxic on baseline 4 L and transition to BiPAP.  Denies chest pain, fever, congestion, abdominal pain, nausea, vomiting.     Physical Exam   Triage Vital Signs: ED Triage Vitals  Encounter Vitals Group     BP 11/16/23 2137 (!) 143/79     Systolic BP Percentile --      Diastolic BP Percentile --      Pulse Rate 11/16/23 2108 79     Resp 11/16/23 2108 19     Temp 11/16/23 2137 99 F (37.2 C)     Temp Source 11/16/23 2137 Oral     SpO2 11/16/23 2054 (!) 85 %     Weight --      Height --      Head Circumference --      Peak Flow --      Pain Score 11/16/23 2056 0     Pain Loc --      Pain Education --      Exclude from Growth Chart --     Most recent vital signs: Vitals:   11/16/23 2108 11/16/23 2137  BP:  (!) 143/79  Pulse: 79 80  Resp: 19 (!) 21  Temp:  99 F (37.2 C)  SpO2: 100% 100%   Physical Exam: I have reviewed the vital signs and nursing notes. General: Awake, alert, tachypneic, Head:  Atraumatic, normocephalic.   ENT:  EOM intact, PERRL. Oral mucosa is pink and moist with no lesions. Neck: Neck is supple with full range of motion, No meningeal signs. Cardiovascular:  RRR, No murmurs. Peripheral pulses palpable and equal bilaterally. Respiratory:  Symmetrical chest wall expansion.   Scattered crackles throughout with diminished air movement.  Tachypnea.  \ Musculoskeletal:  No cyanosis or edema. Moving extremities with full ROM Abdomen:  Soft, nontender, nondistended. Neuro:  GCS 15, moving all four extremities, interacting appropriately. Speech clear. Psych:  Calm, appropriate.   Skin:  Warm, dry, no rash.    ED Results / Procedures / Treatments   Labs (all labs ordered are listed, but only abnormal results are displayed) Labs Reviewed  CBC WITH DIFFERENTIAL/PLATELET - Abnormal; Notable for the following components:      Result Value   RBC 4.10 (*)    MCV 101.7 (*)    MCH 34.6 (*)    Platelets 135 (*)    Abs Immature Granulocytes 0.10 (*)    All other components within normal limits  COMPREHENSIVE METABOLIC PANEL - Abnormal; Notable for the following components:   Potassium 3.0 (*)    Glucose, Bld 109 (*)    Calcium 8.4 (*)    Albumin 3.4 (*)    All other components within normal limits  BLOOD GAS, VENOUS - Abnormal; Notable  for the following components:   Bicarbonate 29.2 (*)    Acid-Base Excess 4.2 (*)    All other components within normal limits  TROPONIN I (HIGH SENSITIVITY) - Abnormal; Notable for the following components:   Troponin I (High Sensitivity) 25 (*)    All other components within normal limits  RESP PANEL BY RT-PCR (RSV, FLU A&B, COVID)  [id number]  TROPONIN I (HIGH SENSITIVITY)     EKG My EKG interpretation: Rate of 79, normal sinus rhythm, normal axis, normal intervals.  No acute ST elevations or depressions   RADIOLOGY Independently interpreted chest x-ray with evidence of chronic lung disease but no obvious focal pneumonia.   PROCEDURES:  Critical Care performed: Yes, see critical care procedure note(s)  .Critical Care  Performed by: Janith Lima, MD Authorized by: Janith Lima, MD   Critical care provider statement:    Critical care time (minutes):  30   Critical care was necessary to treat or prevent imminent or  life-threatening deterioration of the following conditions:  Respiratory failure   Critical care was time spent personally by me on the following activities:  Development of treatment plan with patient or surrogate, discussions with consultants, evaluation of patient's response to treatment, examination of patient, ordering and review of laboratory studies, ordering and review of radiographic studies, ordering and performing treatments and interventions, pulse oximetry, re-evaluation of patient's condition and review of old charts   I assumed direction of critical care for this patient from another provider in my specialty: no     Care discussed with: admitting provider      MEDICATIONS ORDERED IN ED: Medications  ipratropium-albuterol (DUONEB) 0.5-2.5 (3) MG/3ML nebulizer solution 3 mL (3 mLs Nebulization Given 11/16/23 2118)  cefTRIAXone (ROCEPHIN) 2 g in sodium chloride 0.9 % 100 mL IVPB (0 g Intravenous Stopped 11/16/23 2149)  azithromycin (ZITHROMAX) 500 mg in sodium chloride 0.9 % 250 mL IVPB (500 mg Intravenous New Bag/Given 11/16/23 2147)     IMPRESSION / MDM / ASSESSMENT AND PLAN / ED COURSE  I reviewed the triage vital signs and the nursing notes.                              Differential diagnosis includes, but is not limited to, COPD exacerbation, chronic lung disease, pneumonia, low suspicion ACS, acute hypoxic respiratory failure  Patient's presentation is most consistent with acute presentation with potential threat to life or bodily function.  Patient is a 68 year old male presenting today for acute hypoxia with concern for exacerbation of his COPD versus chronic lung disease.  Notably tachypneic and hypoxic on arrival on his baseline oxygen.  Transition to BiPAP with significant improvement in symptoms.  Also given DuoNeb.  Had already received Solu-Medrol with EMS and so additional antibiotics were given to cover for possible community-acquired pneumonia as a source of  exacerbation.  CBC and CMP largely reassuring.  Troponin slightly elevated but not significantly and no chest pain with lower concern for ACS and likely demand induced.  Viral panel negative.  Chest x-ray with no obvious pneumonia but significant chronic lung disease and fibrosis present.  Will admit to hospitalist for suspected COPD versus chronic lung disease exacerbation with new hypoxia.  The patient is on the cardiac monitor to evaluate for evidence of arrhythmia and/or significant heart rate changes.     FINAL CLINICAL IMPRESSION(S) / ED DIAGNOSES   Final diagnoses:  COPD exacerbation (HCC)  Acute hypoxic respiratory  failure (HCC)  Chronic lung disease     Rx / DC Orders   ED Discharge Orders     None        Note:  This document was prepared using Dragon voice recognition software and may include unintentional dictation errors.   Janith Lima, MD 11/16/23 2253

## 2023-11-17 DIAGNOSIS — I2510 Atherosclerotic heart disease of native coronary artery without angina pectoris: Secondary | ICD-10-CM | POA: Insufficient documentation

## 2023-11-17 DIAGNOSIS — E785 Hyperlipidemia, unspecified: Secondary | ICD-10-CM | POA: Diagnosis not present

## 2023-11-17 DIAGNOSIS — J9601 Acute respiratory failure with hypoxia: Secondary | ICD-10-CM | POA: Diagnosis not present

## 2023-11-17 DIAGNOSIS — J441 Chronic obstructive pulmonary disease with (acute) exacerbation: Secondary | ICD-10-CM | POA: Diagnosis not present

## 2023-11-17 DIAGNOSIS — I10 Essential (primary) hypertension: Secondary | ICD-10-CM | POA: Diagnosis not present

## 2023-11-17 DIAGNOSIS — I272 Pulmonary hypertension, unspecified: Secondary | ICD-10-CM

## 2023-11-17 DIAGNOSIS — K219 Gastro-esophageal reflux disease without esophagitis: Secondary | ICD-10-CM | POA: Insufficient documentation

## 2023-11-17 LAB — RESPIRATORY PANEL BY PCR

## 2023-11-17 LAB — BASIC METABOLIC PANEL
Anion gap: 7 (ref 5–15)
BUN: 13 mg/dL (ref 8–23)
CO2: 25 mmol/L (ref 22–32)
Calcium: 8.3 mg/dL — ABNORMAL LOW (ref 8.9–10.3)
Chloride: 109 mmol/L (ref 98–111)
Creatinine, Ser: 0.75 mg/dL (ref 0.61–1.24)
GFR, Estimated: >60 mL/min (ref >60)
Glucose, Bld: 162 mg/dL — ABNORMAL HIGH (ref 70–99)
Potassium: 3.8 mmol/L (ref 3.5–5.1)
Sodium: 141 mmol/L (ref 135–145)

## 2023-11-17 LAB — HIV ANTIBODY (ROUTINE TESTING W REFLEX): HIV Screen 4th Generation wRfx: NONREACTIVE

## 2023-11-17 LAB — CBC
HCT: 39.3 % (ref 39.0–52.0)
Hemoglobin: 13.2 g/dL (ref 13.0–17.0)
MCH: 34.8 pg — ABNORMAL HIGH (ref 26.0–34.0)
MCHC: 33.6 g/dL (ref 30.0–36.0)
MCV: 103.7 fL — ABNORMAL HIGH (ref 80.0–100.0)
Platelets: 122 10*3/uL — ABNORMAL LOW (ref 150–400)
RBC: 3.79 MIL/uL — ABNORMAL LOW (ref 4.22–5.81)
RDW: 14.2 % (ref 11.5–15.5)
WBC: 5.1 10*3/uL (ref 4.0–10.5)
nRBC: 0 % (ref 0.0–0.2)

## 2023-11-17 LAB — TROPONIN I (HIGH SENSITIVITY): Troponin I (High Sensitivity): 23 ng/L — ABNORMAL HIGH (ref <18)

## 2023-11-17 LAB — PROCALCITONIN: Procalcitonin: <0.1 ng/mL

## 2023-11-17 MED ORDER — METHYLPREDNISOLONE SODIUM SUCC 125 MG IJ SOLR
80.0000 mg | INTRAMUSCULAR | Status: DC
  Administered 2023-11-17 – 2023-11-18 (×2): 80 mg via INTRAVENOUS
  Filled 2023-11-17 (×2): qty 2

## 2023-11-17 MED ORDER — METHYLPREDNISOLONE SODIUM SUCC 40 MG IJ SOLR: 40.0000 mg | Freq: Two times a day (BID) | INTRAMUSCULAR | Status: DC

## 2023-11-17 MED ORDER — PIRFENIDONE 267 MG PO CAPS
3.0000 | ORAL_CAPSULE | Freq: Three times a day (TID) | ORAL | Status: DC
  Administered 2023-11-18 – 2023-11-20 (×7): 3 via ORAL
  Filled 2023-11-17 (×12): qty 3

## 2023-11-17 MED ORDER — IPRATROPIUM-ALBUTEROL 0.5-2.5 (3) MG/3ML IN SOLN
3.0000 mL | RESPIRATORY_TRACT | Status: DC
  Administered 2023-11-17 – 2023-11-19 (×8): 3 mL via RESPIRATORY_TRACT
  Filled 2023-11-17 (×8): qty 3

## 2023-11-17 MED ORDER — SODIUM CHLORIDE 0.9 % IV SOLN
500.0000 mg | INTRAVENOUS | Status: DC
  Administered 2023-11-17: 500 mg via INTRAVENOUS
  Filled 2023-11-17: qty 5

## 2023-11-17 NOTE — ED Notes (Signed)
Verified cardiac monitoring with CCMD

## 2023-11-17 NOTE — Assessment & Plan Note (Addendum)
Patient with history of advanced COPD, pulmonary hypertension and pulmonary fibrosis.  Continue to have significant wheeze. Procalcitonin negative. -Added Solu-Medrol and Zithromax. -Continue with bronchodilators and supportive care -Pulmonary was also consulted

## 2023-11-17 NOTE — ED Notes (Signed)
Resumed care from kat rn.  Pt alert, watching tv.  Oxygen in place.  Pt waiting on admission.  Nsr on monitor.  Pt denies any pain.

## 2023-11-17 NOTE — ED Notes (Signed)
Pt is eating breakfast

## 2023-11-17 NOTE — ED Notes (Signed)
Informed pharmacy that family has returned with both home meds.

## 2023-11-17 NOTE — ED Notes (Addendum)
First encounter with pt. Did resp assmt. Pt has coarse crackles lower lobes. Pt family member has home medication which he takes TID: Pirfenidone. Called pharmacy, med will need to be counted and dispensed by them. Asked for pharmacy tech to come help count and pick up med. No other complaints.

## 2023-11-17 NOTE — Assessment & Plan Note (Addendum)
-   We will continue Roflumilast  and Tyvaso.

## 2023-11-17 NOTE — Assessment & Plan Note (Signed)
-   We will continue statin therapy, beta-blocker therapy and as needed sublingual nitroglycerin as well as and has Plavix

## 2023-11-17 NOTE — Progress Notes (Signed)
PHARMACIST - PHYSICIAN COMMUNICATION   CONCERNING: Methylprednisolone IV    Current order: Methylprednisolone IV 40mg  IV every 12 hours     DESCRIPTION: Per [facility] Protocol:   IV methylprednisolone will be converted to either a q12h or q24h frequency with the same total daily dose (TDD).  Ordered Dose: 1 to 125 mg TDD; convert to: TDD q24h.  Ordered Dose: 126 to 250 mg TDD; convert to: TDD div q12h.  Ordered Dose: >250 mg TDD; DAW.  Order has been adjusted to: Methylprednisolone IV 80mg  every 24 hours   Gardner Candle , PharmD, BCPS Clinical Pharmacist  11/17/2023 8:46 AM

## 2023-11-17 NOTE — Consult Note (Signed)
PULMONOLOGY         Date: 11/17/2023,   MRN# [id number] Dillon [name] [facility] Oct 07, 1956     Admission                  Current   Referring provider: Dr Nelson Chimes   CHIEF COMPLAINT:    Acute on chronic hypoxemic respiratory failure   HISTORY OF PRESENT ILLNESS   68 yo M with advanced COPD chronic hypoxemia pulmonary fibrosis chronic cough GERD came in with dyspnea , worsening cough and respiratory distress.  He was placed on BIPAP and required 60% fio2 to reach normal oxygen saturation.  He was given rocephin and zithromax empirically for CAP. His covid panel is negative. 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 For pulmonary fibrosis he takes pirfenidone TID.  He has ILD associated PH and takes tyvasso for this.    PAST MEDICAL HISTORY   Past Medical History:  Diagnosis Date   Chronic cough    COPD (chronic obstructive pulmonary disease) (HCC)    Coronary artery disease    Edentulous    Elevated cholesterol    GERD (gastroesophageal reflux disease)    Myocardial infarction (HCC)    Pulmonary fibrosis (HCC)    Restless leg syndrome    Supplemental oxygen dependent      SURGICAL HISTORY   Past Surgical History:  Procedure Laterality Date   ANGIOPLASTY  2010   2 stents placed   BACK SURGERY     cardiac stents     x2   CATARACT EXTRACTION W/PHACO Left 08/20/2023   Procedure: CATARACT EXTRACTION PHACO AND INTRAOCULAR LENS PLACEMENT (IOC) LEFT 16.87 01:21.8;  Surgeon: Estanislado Pandy, MD;  Location: [facility];  Service: Ophthalmology;  Laterality: Left;   CATARACT EXTRACTION W/PHACO Right 09/03/2023   Procedure: CATARACT EXTRACTION PHACO AND INTRAOCULAR LENS PLACEMENT (IOC) RIGHT 19.41 01:30.0;  Surgeon: Estanislado Pandy, MD;  Location: [facility];  Service: Ophthalmology;  Laterality: Right;   COLONOSCOPY     COLONOSCOPY WITH PROPOFOL N/A 03/05/2018   Procedure: COLONOSCOPY WITH PROPOFOL;  Surgeon: Pasty Spillers, MD;  Location: [facility] ENDOSCOPY;   Service: Endoscopy;  Laterality: N/A;   COLONOSCOPY WITH PROPOFOL N/A 01/24/2022   Procedure: COLONOSCOPY WITH PROPOFOL;  Surgeon: Wyline Mood, MD;  Location: [facility] ENDOSCOPY;  Service: Gastroenterology;  Laterality: N/A;   CORONARY ANGIOPLASTY     CORONARY/GRAFT ACUTE MI REVASCULARIZATION N/A 09/30/2019   Procedure: Coronary/Graft Acute MI Revascularization;  Surgeon: Iran Ouch, MD;  Location: [facility] INVASIVE CV LAB;  Service: Cardiovascular;  Laterality: N/A;   LEFT HEART CATH AND CORONARY ANGIOGRAPHY N/A 09/30/2019   Procedure: LEFT HEART CATH AND CORONARY ANGIOGRAPHY;  Surgeon: Iran Ouch, MD;  Location: [facility] INVASIVE CV LAB;  Service: Cardiovascular;  Laterality: N/A;   LEFT HEART CATH AND CORS/GRAFTS ANGIOGRAPHY N/A 10/01/2021   Procedure: LEFT HEART CATH AND CORS/GRAFTS ANGIOGRAPHY;  Surgeon: Yvonne Kendall, MD;  Location: [facility] INVASIVE CV LAB;  Service: Cardiovascular;  Laterality: N/A;     FAMILY HISTORY   Family History  Problem Relation Age of Onset   Heart disease Mother    Heart disease Father      SOCIAL HISTORY   Social History   Tobacco Use   Smoking status: Former    Current packs/day: 0.00    Average packs/day: 2.0 packs/day for 40.0 years (80.0 ttl pk-yrs)    Types: Cigarettes    Start date: 30    Quit date: 2013    Years  since quitting: 12.0   Smokeless tobacco: Never  Vaping Use   Vaping status: Never Used  Substance Use Topics   Alcohol use: Not Currently    Comment: occassionally - 1 beer/month or less   Drug use: Not Currently     MEDICATIONS    Home Medication:  Current Outpatient Rx   Order #: [id number] Class: Historical Med   Order #: [id number] Class: Historical Med   Order #: [id number] Class: Historical Med   Order #: [id number] Class: Normal   Order #: [id number] Class: Normal   Order #: [id number] Class: Normal   Order #: [id number] Class: Normal   Order #: [id number] Class: Normal   Order #: [id number] Class: Historical Med    Order #: [id number] Class: Historical Med   Order #: [id number] Class: Historical Med   Order #: [id number] Class: Historical Med   Order #: [id number] Class: Historical Med   Order #: [id number] Class: Historical Med   Order #: [id number] Class: Normal    Current Medication:  Current Facility-Administered Medications:    0.9 %  sodium chloride infusion, , Intravenous, Continuous, Mansy, Vernetta Honey, MD, Paused at 11/17/23 0810   acetaminophen (TYLENOL) tablet 650 mg, 650 mg, Oral, Q6H PRN, 650 mg at 11/17/23 0019 **OR** acetaminophen (TYLENOL) suppository 650 mg, 650 mg, Rectal, Q6H PRN, Mansy, Jan A, MD   atorvastatin (LIPITOR) tablet 40 mg, 40 mg, Oral, Daily, Mansy, Jan A, MD, 40 mg at 11/17/23 6269   azithromycin (ZITHROMAX) 500 mg in sodium chloride 0.9 % 250 mL IVPB, 500 mg, Intravenous, Q24H, Amin, Tilman Neat, MD   carvedilol (COREG) tablet 6.25 mg, 6.25 mg, Oral, BID WC, Mansy, Jan A, MD, 6.25 mg at 11/17/23 0753   chlorpheniramine-HYDROcodone (TUSSIONEX) 10-8 MG/5ML suspension 5 mL, 5 mL, Oral, Q12H PRN, Mansy, Jan A, MD   clopidogrel (PLAVIX) tablet 37.5 mg, 37.5 mg, Oral, QODAY, Mansy, Jan A, MD, 37.5 mg at 11/17/23 1123   enoxaparin (LOVENOX) injection 40 mg, 40 mg, Subcutaneous, Q24H, Mansy, Jan A, MD, 40 mg at 11/16/23 2342   guaiFENesin (MUCINEX) 12 hr tablet 600 mg, 600 mg, Oral, BID, Mansy, Jan A, MD, 600 mg at 11/17/23 4854   ipratropium-albuterol (DUONEB) 0.5-2.5 (3) MG/3ML nebulizer solution 3 mL, 3 mL, Nebulization, Q4H, Amin, Tilman Neat, MD, 3 mL at 11/17/23 1514   magnesium hydroxide (MILK OF MAGNESIA) suspension 30 mL, 30 mL, Oral, Daily PRN, Mansy, Jan A, MD   methylPREDNISolone sodium succinate (SOLU-MEDROL) 125 mg/2 mL injection 80 mg, 80 mg, Intravenous, Q24H, Hallaji, Sheema M, RPH, 80 mg at 11/17/23 6270   nitroGLYCERIN (NITROSTAT) SL tablet 0.4 mg, 0.4 mg, Sublingual, Q5 Min x 3 PRN, Mansy, Jan A, MD   omega-3 acid ethyl esters (LOVAZA) capsule 1,000 mg, 1,000 mg, Oral, Daily, Mansy,  Jan A, MD, 1,000 mg at 11/17/23 0937   ondansetron (ZOFRAN) tablet 4 mg, 4 mg, Oral, Q6H PRN **OR** ondansetron (ZOFRAN) injection 4 mg, 4 mg, Intravenous, Q6H PRN, Mansy, Jan A, MD   pantoprazole (PROTONIX) EC tablet 40 mg, 40 mg, Oral, Daily, Mansy, Jan A, MD, 40 mg at 11/17/23 3500   Pirfenidone CAPS 3 capsule, 3 capsule, Oral, TID, Mansy, Jan A, MD   roflumilast (DALIRESP) tablet 500 mcg, 500 mcg, Oral, Daily, Mansy, Jan A, MD, 500 mcg at 11/17/23 9381   traZODone (DESYREL) tablet 25 mg, 25 mg, Oral, QHS PRN, Mansy, Jan A, MD   Treprostinil (TYVASO) inhalation solution 18 mcg, 18 mcg, Inhalation, QID, Mansy, Jan A, MD, 18 mcg at 11/17/23 1514  Current Outpatient Medications:    acetaminophen (TYLENOL) 500  MG tablet, Take 1,000 mg by mouth every 8 (eight) hours as needed for moderate pain., Disp: , Rfl:    albuterol (PROVENTIL) (2.5 MG/3ML) 0.083% nebulizer solution, SMARTSIG:3 Milliliter(s) Via Nebulizer Every 6 Hours PRN, Disp: , Rfl:    ALPHA LIPOIC ACID PO, Take 500-1,000 mg by mouth daily., Disp: , Rfl:    atorvastatin (LIPITOR) 40 MG tablet, TAKE 1 TABLET BY MOUTH EVERY DAY, Disp: 90 tablet, Rfl: 1   carvedilol (COREG) 6.25 MG tablet, Take 1 tablet (6.25 mg total) by mouth 2 (two) times daily with a meal., Disp: 180 tablet, Rfl: 3   clopidogrel (PLAVIX) 75 MG tablet, TAKE 1/2 TABLET (37.5 MG TOTAL) BY MOUTH EVERY OTHER DAY., Disp: 22.5 tablet, Rfl: 0   Fluticasone-Umeclidin-Vilant (TRELEGY ELLIPTA) 100-62.5-25 MCG/ACT AEPB, Inhale 1 puff into the lungs daily., Disp: 60 each, Rfl: 1   nitroGLYCERIN (NITROSTAT) 0.4 MG SL tablet, Place 1 tablet (0.4 mg total) under the tongue every 5 (five) minutes x 3 doses as needed for chest pain., Disp: 25 tablet, Rfl: 3   pantoprazole (PROTONIX) 40 MG tablet, Take 40 mg by mouth daily., Disp: , Rfl:    Pirfenidone (ESBRIET) 267 MG CAPS, Take 3 capsules by mouth 3 (three) times daily., Disp: , Rfl:    predniSONE (DELTASONE) 20 MG tablet, Take 20 mg by  mouth daily., Disp: , Rfl:    roflumilast (DALIRESP) 500 MCG TABS tablet, Take 1 tablet by mouth daily. Every other day, Disp: , Rfl:    Treprostinil (TYVASO STARTER KIT) 0.6 MG/ML SOLN, Inhale 18 mcg into the lungs 4 (four) times daily., Disp: , Rfl:    OXYGEN, Inhale 4 L into the lungs continuous., Disp: , Rfl:    triamcinolone ointment (KENALOG) 0.1 %, APPLY TO ITCHY AREAS AT NECK AND BODY TWICE A DAY FOR UP TO 2 WEEKS. AVOID APPLYING TO FACE, GROIN, AND AXILLA. USE AS DIRECTED. LONG-TERM USE CAN CAUSE THINNING OF THE SKIN. (Patient not taking: Reported on 11/17/2023), Disp: 454 g, Rfl: 0    ALLERGIES   Ace inhibitors and Cinnamon     REVIEW OF SYSTEMS    Review of Systems:  Gen:  Denies  fever, sweats, chills weigh loss  HEENT: Denies blurred vision, double vision, ear pain, eye pain, hearing loss, nose bleeds, sore throat Cardiac:  No dizziness, chest pain or heaviness, chest tightness,edema Resp:   reports dyspnea chronically  Gi: Denies swallowing difficulty, stomach pain, nausea or vomiting, diarrhea, constipation, bowel incontinence Gu:  Denies bladder incontinence, burning urine Ext:   Denies Joint pain, stiffness or swelling Skin: Denies  skin rash, easy bruising or bleeding or hives Endoc:  Denies polyuria, polydipsia , polyphagia or weight change Psych:   Denies depression, insomnia or hallucinations   Other:  All other systems negative   VS: BP 137/82   Pulse 65   Temp 97.6 F (36.4 C) (Oral)   Resp 17   SpO2 100%      PHYSICAL EXAM    GENERAL:NAD, no fevers, chills, no weakness no fatigue HEAD: Normocephalic, atraumatic.  EYES: Pupils equal, round, reactive to light. Extraocular muscles intact. No scleral icterus.  MOUTH: Moist mucosal membrane. Dentition intact. No abscess noted.  EAR, NOSE, THROAT: Clear without exudates. No external lesions.  NECK: Supple. No thyromegaly. No nodules. No JVD.  PULMONARY: decreased breath sounds with mild rhonchi  worse at bases bilaterally.  CARDIOVASCULAR: S1 and S2. Regular rate and rhythm. No murmurs, rubs, or gallops. No edema. Pedal pulses 2+ bilaterally.  GASTROINTESTINAL: Soft, nontender, nondistended. No masses. Positive bowel sounds. No hepatosplenomegaly.  MUSCULOSKELETAL: No swelling, clubbing, or edema. Range of motion full in all extremities.  NEUROLOGIC: Cranial nerves II through XII are intact. No gross focal neurological deficits. Sensation intact. Reflexes intact.  SKIN: No ulceration, lesions, rashes, or cyanosis. Skin warm and dry. Turgor intact.  PSYCHIATRIC: Mood, affect within normal limits. The patient is awake, alert and oriented x 3. Insight, judgment intact.       IMAGING     ative & Impression  CLINICAL DATA:  Acute aortic syndrome suspected history of aneurysm   EXAM: CT ANGIOGRAPHY CHEST WITH CONTRAST   TECHNIQUE: Multidetector CT imaging of the chest was performed using the standard protocol during bolus administration of intravenous contrast. Multiplanar CT image reconstructions and MIPs were obtained to evaluate the vascular anatomy.   RADIATION DOSE REDUCTION: This exam was performed according to the departmental dose-optimization program which includes automated exposure control, adjustment of the mA and/or kV according to patient size and/or use of iterative reconstruction technique.   CONTRAST:  OMNIPAQUE IOHEXOL 350 MG/ML SOLN   COMPARISON:  CT 10/24/2022, 07/08/2021   FINDINGS: Cardiovascular: Non contrasted images of the chest demonstrate no acute intramural hematoma. Mild aortic atherosclerosis. No aneurysm. Coronary vascular calcification. Normal cardiac size. No pericardial effusion. No dissection is seen   Mediastinum/Nodes: Patent trachea. No suspicious thyroid mass. Borderline mediastinal nodes mildly decreased compared to prior. Esophagus within normal limits.   Lungs/Pleura: Emphysema and fibrotic lung disease. No acute  airspace disease, pleural effusion or pneumothorax. Scarring in the left upper lobe.   Upper Abdomen: No acute finding   Musculoskeletal: No acute osseous abnormality.   Review of the MIP images confirms the above findings.   IMPRESSION: 1. Negative for acute aortic dissection or aneurysm in the chest. Follow-up dedicated imaging of the abdomen and pelvis may be performed if indicated. 2. Emphysema and fibrotic lung disease. 3. Aortic atherosclerosis.   Aortic Atherosclerosis (ICD10-I70.0) and Emphysema (ICD10-J43.9).   These results will be called to the ordering clinician or representative by the Radiologist Assistant, and communication documented in the PACS or [employer].     Electronically Signed   By: Jasmine Pang M.D.   On: 10/02/2023 19:26    ASSESSMENT/PLAN   Acute exacerbation of ILD and COPD    - continue perfenidone TID    Agree with solumedrol 80mg  daily    - duoneb nebulizer therapy   - rocephin and zithromax empirically     -CRP trend daily   -continue BIPAP as current  Advanced COPD    COPD carepath as above   - continue Daliresp   - steroids and antimicrobials as current   - incentive spirometry   Chronic pulmonary hypertension Tyvasso QID         Thank you for allowing me to participate in the care of this patient.   Patient/Family are satisfied with care plan and all questions have been answered.    Provider disclosure: Patient with at least one acute or chronic illness or injury that poses a threat to life or bodily function and is being managed actively during this encounter.  All of the below services have been performed independently by signing provider:  review of prior documentation from internal and or external health records.  Review of previous and current lab results.  Interview and comprehensive assessment during patient visit today. Review of current and previous chest radiographs/CT scans. Discussion of  management and test  interpretation with health care team and patient/family.   This document was prepared using Dragon voice recognition software and may include unintentional dictation errors.     Vida Rigger, M.D.  Division of Pulmonary & Critical Care Medicine

## 2023-11-17 NOTE — Assessment & Plan Note (Addendum)
Initially required BiPAP, currently on 8 L of oxygen.  Uses 4 L at baseline.  Concern of COPD exacerbation, history of pulmonary hypertension and pulmonary fibrosis. Pulmonary was also consulted. -Continue supplemental oxygen-wean to baseline as tolerated

## 2023-11-17 NOTE — Progress Notes (Signed)
  Progress Note   Patient: Dillon [name] ZOX:[id number] DOB: 03/31/1956 DOA: 11/16/2023     1 DOS: the patient was seen and examined on 11/17/2023   Brief hospital course: Taken from H&P.  Dillon [name] is a 68 y.o. Caucasian male with medical history significant for COPD, GERD, pulmonary fibrosis, restless leg syndrome and coronary artery disease, who presented to the emergency room with acute onset of dyspnea, cough with inability to expectorate over the last 4 to 5 days with mild chills without reported fever.  Did not any chest pain or palpitations.   On presentation hemodynamically stable, labs with potassium of 3.  Respiratory panel negative for COVID, influenza and RSV. EKG with NSR and left axis deviation.  CXR with no acute cardiopulmonary disease. Patient was initially started on BiPAP.  Admitted for concern of COPD exacerbation.  1/28: Vital stable, currently on 8 L of oxygen.  Hypokalemia resolved.  Checking respiratory viral panel to find out the cause of current exacerbation. Procalcitonin came back negative so stopping ceftriaxone. Adding Solu-Medrol and Zithromax for concern of COPD exacerbation. Pulmonary was also consulted.    Assessment and Plan: * Acute respiratory failure with hypoxia (HCC) Initially required BiPAP, currently on 8 L of oxygen.  Uses 4 L at baseline.  Concern of COPD exacerbation, history of pulmonary hypertension and pulmonary fibrosis. Pulmonary was also consulted. -Continue supplemental oxygen-wean to baseline as tolerated  COPD with acute exacerbation (HCC) Patient with history of advanced COPD, pulmonary hypertension and pulmonary fibrosis.  Continue to have significant wheeze. Procalcitonin negative. -Added Solu-Medrol and Zithromax. -Continue with bronchodilators and supportive care -Pulmonary was also consulted   Essential hypertension -Continue home Coreg  Dyslipidemia - We will continue statin therapy.  Coronary artery disease -  We will continue statin therapy, beta-blocker therapy and as needed sublingual nitroglycerin as well as and has Plavix  GERD without esophagitis - We will continue PPI therapy.  Pulmonary hypertension (HCC) - We will continue Roflumilast  and Tyvaso.   Subjective: Patient was still feeling short of breath when seen today.  Worsening with minor exertion.  Physical Exam: Vitals:   11/17/23 0539 11/17/23 0730 11/17/23 0805 11/17/23 1130  BP:  132/85  137/82  Pulse:  67  65  Resp:  (!) 22  17  Temp: 98.7 F (37.1 C)  97.6 F (36.4 C)   TempSrc: Oral  Axillary   SpO2:  96%  100%   General.  Well-developed gentleman, in no acute distress. Pulmonary.  Harsh breath sounds bilaterally, mildly increased work of breathing CV.  Regular rate and rhythm, no JVD, rub or murmur. Abdomen.  Soft, nontender, nondistended, BS positive. CNS.  Alert and oriented .  No focal neurologic deficit. Extremities.  No edema, no cyanosis, pulses intact and symmetrical. Psychiatry.  Judgment and insight appears normal.   Data Reviewed: Prior data reviewed  Family Communication: Discussed with wife at bedside  Disposition: Status is: Inpatient Remains inpatient appropriate because: Severity of illness  Planned Discharge Destination: Home  DVT prophylaxis.  Lovenox Time spent: 50 minutes  This record has been created using Conservation officer, historic buildings. Errors have been sought and corrected,but may not always be located. Such creation errors do not reflect on the standard of care.   Author: Arnetha Courser, MD 11/17/2023 1:51 PM  For on call review [email or web address].

## 2023-11-17 NOTE — ED Notes (Signed)
CCMD called and pt added to monitor

## 2023-11-17 NOTE — Hospital Course (Addendum)
Taken from H&P.  Dillon [name] is a 68 y.o. Caucasian male with medical history significant for COPD, GERD, pulmonary fibrosis, restless leg syndrome and coronary artery disease, who presented to the emergency room with acute onset of dyspnea, cough with inability to expectorate over the last 4 to 5 days with mild chills without reported fever.  Did not any chest pain or palpitations.   On presentation hemodynamically stable, labs with potassium of 3.  Respiratory panel negative for COVID, influenza and RSV. EKG with NSR and left axis deviation.  CXR with no acute cardiopulmonary disease. Patient was initially started on BiPAP.  Admitted for concern of COPD exacerbation.  1/28: Vital stable, currently on 8 L of oxygen.  Hypokalemia resolved.  Checking respiratory viral panel to find out the cause of current exacerbation. Procalcitonin came back negative so stopping ceftriaxone. Adding Solu-Medrol and Zithromax for concern of COPD exacerbation. Pulmonary was also consulted.

## 2023-11-17 NOTE — Assessment & Plan Note (Addendum)
-  Continue home Coreg

## 2023-11-17 NOTE — Assessment & Plan Note (Signed)
-  We will continue statin therapy.

## 2023-11-17 NOTE — ED Notes (Signed)
Pharmacy tech at bedside to collect home med.

## 2023-11-17 NOTE — ED Notes (Signed)
Messaged provider re: pt has coarse crackles to lower lobes and has NS infusing at 135mL/hr to ask if wants to d/c this med.

## 2023-11-17 NOTE — Assessment & Plan Note (Signed)
-  We will continue PPI therapy

## 2023-11-18 ENCOUNTER — Encounter: Payer: Self-pay | Admitting: Family Medicine

## 2023-11-18 DIAGNOSIS — J9601 Acute respiratory failure with hypoxia: Secondary | ICD-10-CM | POA: Diagnosis not present

## 2023-11-18 LAB — C-REACTIVE PROTEIN: CRP: 0.8 mg/dL (ref <1.0)

## 2023-11-18 MED ORDER — ROFLUMILAST 500 MCG PO TABS
500.0000 ug | ORAL_TABLET | ORAL | Status: DC
  Administered 2023-11-20: 500 ug via ORAL
  Filled 2023-11-18: qty 1

## 2023-11-18 MED ORDER — AZITHROMYCIN 250 MG PO TABS
250.0000 mg | ORAL_TABLET | Freq: Every day | ORAL | Status: DC
  Administered 2023-11-18 – 2023-11-20 (×3): 250 mg via ORAL
  Filled 2023-11-18 (×4): qty 1

## 2023-11-18 MED ORDER — METHYLPREDNISOLONE SODIUM SUCC 40 MG IJ SOLR
40.0000 mg | INTRAMUSCULAR | Status: DC
  Administered 2023-11-19: 40 mg via INTRAVENOUS
  Filled 2023-11-18: qty 1

## 2023-11-18 MED ORDER — ROFLUMILAST 500 MCG PO TABS: 500.0000 ug | ORAL_TABLET | ORAL | Status: DC

## 2023-11-18 MED ORDER — FLUTICASONE FUROATE-VILANTEROL 100-25 MCG/ACT IN AEPB
1.0000 | INHALATION_SPRAY | Freq: Every day | RESPIRATORY_TRACT | Status: DC
  Administered 2023-11-18 – 2023-11-20 (×3): 1 via RESPIRATORY_TRACT
  Filled 2023-11-18: qty 28

## 2023-11-18 MED ORDER — UMECLIDINIUM BROMIDE 62.5 MCG/ACT IN AEPB: 1.0000 | INHALATION_SPRAY | Freq: Every day | RESPIRATORY_TRACT | Status: DC

## 2023-11-18 NOTE — ED Notes (Signed)
Pt watching tv

## 2023-11-18 NOTE — Evaluation (Signed)
Occupational Therapy Evaluation Patient Details Name: Dillon [name] MRN: [id number] DOB: 08/15/1956 Today's Date: 11/18/2023   History of Present Illness Pt is a 68 year old male admitted with Acute exacerbation of ILD and COPD    PMH significant for dvanced COPD chronic hypoxemia pulmonary fibrosis chronic cough GERD came in with dyspnea ,   Clinical Impression   Chart reviewed, pt greeted in room, agreeable to OT evaluation. Pt is alert and oriented x4. PTA pt is MOD I-I in ADL/IADL, drives, works PT as a Curator, amb no AD but does have a transport wheelchair at home for extremely long distances per his report. Pt presents with deficits in activity tolerance and endurance affecting safe and optimal ADL completion. Pt completes ADL/mobility CGA-supervision level. Education provided re: energy conservation techniques. OT will follow acutely to facilitate optimal ADL performance/return to PLOF.       If plan is discharge home, recommend the following: A little help with bathing/dressing/bathroom;Assistance with cooking/housework;Assist for transportation;Help with stairs or ramp for entrance    Functional Status Assessment  Patient has had a recent decline in their functional status and demonstrates the ability to make significant improvements in function in a reasonable and predictable amount of time.  Equipment Recommendations  None recommended by OT    Recommendations for Other Services       Precautions / Restrictions Precautions Precautions: Fall Restrictions Weight Bearing Restrictions Per Provider Order: No      Mobility Bed Mobility Overal bed mobility: Modified Independent                  Transfers Overall transfer level: Needs assistance   Transfers: Sit to/from Stand Sit to Stand: Modified independent (Device/Increase time), Supervision           General transfer comment: supervision, progress to MOD I off bed      Balance Overall balance  assessment: Needs assistance Sitting-balance support: Feet supported Sitting balance-Leahy Scale: Normal     Standing balance support: No upper extremity supported Standing balance-Leahy Scale: Good                             ADL either performed or assessed with clinical judgement   ADL Overall ADL's : Needs assistance/impaired     Grooming: Wash/dry hands;Sitting;Supervision/safety Grooming Details (indicate cue type and reason): vcs for energy conservation, pacing             Lower Body Dressing: Contact guard assist;Sit to/from stand Lower Body Dressing Details (indicate cue type and reason): pants Toilet Transfer: Supervision/safety;Contact guard assist Toilet Transfer Details (indicate cue type and reason): ED toilet with no AD Toileting- Clothing Manipulation and Hygiene: Supervision/safety;Sitting/lateral lean Toileting - Clothing Manipulation Details (indicate cue type and reason): continent BM on toilet     Functional mobility during ADLs: Supervision/safety;Contact guard assist (approx 15' in room)       Vision Patient Visual Report: No change from baseline       Perception         Praxis         Pertinent Vitals/Pain Pain Assessment Pain Assessment: No/denies pain     Extremity/Trunk Assessment Upper Extremity Assessment Upper Extremity Assessment: Overall WFL for tasks assessed   Lower Extremity Assessment Lower Extremity Assessment: Overall WFL for tasks assessed   Cervical / Trunk Assessment Cervical / Trunk Assessment: Normal   Communication Communication Communication: No apparent difficulties Cueing Techniques: Verbal cues   Cognition  Arousal: Alert Behavior During Therapy: WFL for tasks assessed/performed Overall Cognitive Status: Within Functional Limits for tasks assessed                                       General Comments  spo2 86% on 8L via HFNC when OT entered room, >90% on 10 L via HFNC  during mobility, back to 8L via HFNC in bed >90%; all other vitals monitored and appear [facility]    Exercises Other Exercises Other Exercises: edu re: role of OT, role of rehab, safe ADL completion, energy conservation techniques   Shoulder Instructions      Home Living Family/patient expects to be discharged to:: Private residence Living Arrangements: Spouse/significant other Available Help at Discharge: Family Type of Home: House Home Access: Stairs to enter [employer] of Steps: 6 in back, 2 in front but front is father away Entrance Stairs-Rails: Left Home Layout: One level     Bathroom Shower/Tub: Chief Strategy Officer: Standard Bathroom Accessibility: Yes   Home Equipment: Wheelchair - manual   Additional Comments: 4 L via [place] at baseline      Prior Functioning/Environment Prior Level of Function : Independent/Modified Independent;Working/employed;Driving             Mobility Comments: amb with no AD, intermittent use mwc for [place] distances ADLs Comments: MOD I-I in ADL/IADL; wife assists with cooking/cleaning, works part time as a Armed forces technical officer Problem List: Decreased activity tolerance;Cardiopulmonary status limiting activity;Decreased knowledge of use of DME or AE      OT Treatment/Interventions: Self-care/ADL training;Energy conservation;Patient/family education;Therapeutic activities;DME and/or AE instruction    OT Goals(Current goals can be found in the care plan section) Acute Rehab OT Goals Patient Stated Goal: go home OT Goal Formulation: With patient/family Time For Goal Achievement: 12/02/23 Potential to Achieve Goals: Good ADL Goals Pt Will Perform Grooming: with modified independence;standing Pt Will Perform Lower Body Dressing: with modified independence Pt Will Transfer to Toilet: with modified independence;ambulating Pt Will Perform Toileting - Clothing Manipulation and hygiene: with modified  independence;sit to/from stand Additional ADL Goal #1: pt will improve ADL performance as evidenced by verbalizing 3 energy conservation techniques  OT Frequency: Min 1X/week    Co-evaluation              AM-PAC OT "6 Clicks" Daily Activity     Outcome Measure Help from another person eating meals?: None Help from another person taking care of personal grooming?: None Help from another person toileting, which includes using toliet, bedpan, or urinal?: None Help from another person bathing (including washing, rinsing, drying)?: A Little Help from another person to put on and taking off regular upper body clothing?: None Help from another person to put on and taking off regular lower body clothing?: A Little 6 Click Score: 22   End of Session Equipment Utilized During Treatment: Oxygen Nurse Communication: Mobility status  Activity Tolerance: Patient tolerated treatment well Patient left: in bed;with call bell/phone within reach;with family/visitor present  OT Visit Diagnosis: Other abnormalities of gait and mobility (R26.89)                Time: 4098-1191 OT Time Calculation (min): 17 min Charges:  OT General Charges $OT Visit: 1 Visit OT Evaluation $OT Eval Low Complexity: 1 Low  Oleta Mouse, OTD OTR/L  11/18/23, 3:45 PM

## 2023-11-18 NOTE — Evaluation (Signed)
Physical Therapy Evaluation Patient Details Name: Dillon [name] MRN: [id number] DOB: 01/29/1956 Today's Date: 11/18/2023  History of Present Illness  Pt is a 67 year old male admitted with Acute exacerbation of ILD and COPD    PMH significant for dvanced COPD chronic hypoxemia pulmonary fibrosis chronic cough GERD came in with dyspnea ,  Clinical Impression  Pt pleasant and interactive t/o session, he moved with relative ease and showed good strength/mobility but relatively poor activity tolerance.  He was on 8L t/o the session with SpO2 in the mid 90s at rest, staying >88% much of the ambulation effort (~130 ft) but by the end O2 was dropping into the low 80s and he did have some DOE/increased WOB.  He did not have any overt balance/safety issues using the walker (hardly needed to use it) but his activity tolerance is far from his baseline even with 2X his normal O2 requirement.  Pt will benefit from continued PT to address functional and activity tolerance limitations.        If plan is discharge home, recommend the following: A little help with walking and/or transfers;Assistance with cooking/housework;Assist for transportation   Can travel by private vehicle        Equipment Recommendations  (has [phone])  Recommendations for Other Services       Functional Status Assessment Patient has had a recent decline in their functional status and demonstrates the ability to make significant improvements in function in a reasonable and predictable amount of time.     Precautions / Restrictions Precautions Precautions: Fall Restrictions Weight Bearing Restrictions Per Provider Order: No      Mobility  Bed Mobility Overal bed mobility: Modified Independent                  Transfers Overall transfer level: Modified independent Equipment used: Rolling walker (2 wheels) Transfers: Sit to/from Stand Sit to Stand: Modified independent (Device/Increase time), Supervision            General transfer comment: utilized RW for energy conservation but pt seemed to use only minimal WBing through it    Ambulation/Gait Ambulation/Gait assistance: Supervision Gait Distance (Feet): 130 Feet Assistive device: Rolling walker (2 wheels)         General Gait Details: Pt with relatively confident and consistent cadence.  Used walker (no AD at baseline but has 4WW) for energy conservation, on 8 L O2 during the effort.  He initially had a drop in SpO2 from mid 90s to high 80s but stayed in the 87-90% range for most of the effort. Did drop to low 80s (per accuracy of reading maybe high 70s??) by the end of the effort.  Pt was able to hold intermittent conversation with the effort, with more focus on breathing near the end - some DOE w/o it being excessive.  Stairs            Wheelchair Mobility     Tilt Bed    Modified Rankin (Stroke Patients Only)       Balance Overall balance assessment: Needs assistance Sitting-balance support: Feet supported Sitting balance-Leahy Scale: Normal     Standing balance support: No upper extremity supported (used walker more for energy and balance) Standing balance-Leahy Scale: Good                               Pertinent Vitals/Pain Pain Assessment Pain Assessment: No/denies pain    Home Living Family/patient  expects to be discharged to:: Private residence Living Arrangements: Spouse/significant other Available Help at Discharge: Family Type of Home: House Home Access: Stairs to enter Entrance Stairs-Rails: Left Entrance Stairs-Number of Steps: 6 in back, 2 in front but front is father away   Home Layout: One level Home Equipment: Scientist, research (medical) (4 wheels) Additional Comments: 4 L via [place] at baseline    Prior Function Prior Level of Function : Independent/Modified Independent;Working/employed;Driving             Mobility Comments: amb with no AD, intermittent use mwc for [place]  distances ADLs Comments: MOD I-I in ADL/IADL; wife assists with cooking/cleaning, works part time as a Optometrist Extremity Assessment Upper Extremity Assessment: Overall WFL for tasks assessed    Lower Extremity Assessment Lower Extremity Assessment: Overall WFL for tasks assessed    Cervical / Trunk Assessment Cervical / Trunk Assessment: Normal  Communication   Communication Communication: No apparent difficulties Cueing Techniques: Verbal cues  Cognition Arousal: Alert Behavior During Therapy: WFL for tasks assessed/performed Overall Cognitive Status: Within Functional Limits for tasks assessed                                          General Comments General comments (skin integrity, edema, etc.): on 8 L, on arrival SpO2 in the mid 90, did drop to low 80s with some DOE with >100 ft of ambulation.  slowly back to 90s in sitting after the effort    Exercises     Assessment/Plan    PT Assessment Patient needs continued PT services  PT Problem List Decreased activity tolerance;Decreased knowledge of use of DME;Decreased safety awareness;Cardiopulmonary status limiting activity       PT Treatment Interventions DME instruction;Gait training;Stair training;Functional mobility training;Therapeutic activities;Therapeutic exercise;Balance training;Patient/family education    PT Goals (Current goals can be found in the Care Plan section)  Acute Rehab PT Goals Patient Stated Goal: get breathing better, go home PT Goal Formulation: With patient Time For Goal Achievement: 12/01/23 Potential to Achieve Goals: Good    Frequency Min 1X/week     Co-evaluation               AM-PAC PT "6 Clicks" Mobility  Outcome Measure Help needed turning from your back to your side while in a flat bed without using bedrails?: None Help needed moving from lying on your back to sitting on the side of a flat bed without using  bedrails?: None Help needed moving to and from a bed to a chair (including a wheelchair)?: None Help needed standing up from a chair using your arms (e.g., wheelchair or bedside chair)?: None Help needed to walk in hospital room?: A Little Help needed climbing 3-5 steps with a railing? : A Little 6 Click Score: 22    End of Session Equipment Utilized During Treatment: Gait belt;Oxygen (8L) Activity Tolerance: Patient tolerated treatment well;Patient limited by fatigue Patient left: in bed;with call bell/phone within reach;with family/visitor present Nurse Communication: Mobility status (question about home meds) PT Visit Diagnosis: Muscle weakness (generalized) (M62.81);Difficulty in walking, not elsewhere classified (R26.2)    Time: 1610-9604 PT Time Calculation (min) (ACUTE ONLY): 20 min   Charges:   PT Evaluation $PT Eval Low Complexity: 1 Low   PT General Charges $$ ACUTE PT VISIT: 1 Visit  Malachi Pro, DPT 11/18/2023, 5:07 PM

## 2023-11-18 NOTE — Progress Notes (Signed)
Progress Note    Dillon [name]  UJW:[id number] DOB: 1956-06-11  DOA: 11/16/2023 PCP: Corky Downs, MD      Brief Narrative:    Medical records reviewed and are as summarized below:  Dillon [name] is a 68 y.o. male  with medical history significant for COPD, GERD, pulmonary fibrosis, restless leg syndrome and coronary artery disease, who presented to the emergency room with acute onset of dyspnea, cough with inability to expectorate mild chills of about 4 to 5 days duration.        Assessment/Plan:   Principal Problem:   Acute respiratory failure with hypoxia (HCC) Active Problems:   COPD with acute exacerbation (HCC)   Essential hypertension   Dyslipidemia   Pulmonary hypertension (HCC)   GERD without esophagitis   Coronary artery disease    Acute on chronic respiratory failure with hypoxia (HCC) Initially required BiPAP.  He is on 8 L/min oxygen via [place].  Uses 4 L at baseline.  Follow-up with pulmonologist.   Acute exacerbation of COPD and interstitial lung disease  Continue IV steroids, bronchodilators and azithromycin.    Pulmonary hypertension (HCC) - Continue pirfenidone and Tyvaso.    Comorbidities include hypertension, dyslipidemia, CAD on Plavix, GERD  Plan discussed with Tiffany, RN, at the bedside.  She will proceed with pharmacy to expedite the dispensation of pirfenidone and other chronic meds.   Diet Order             Diet Heart Room service appropriate? Yes; Fluid consistency: Thin  Diet effective now                            Consultants: Pulmonologist  Procedures: None    Medications:    atorvastatin  40 mg Oral Daily   carvedilol  6.25 mg Oral BID WC   clopidogrel  37.5 mg Oral QODAY   enoxaparin (LOVENOX) injection  40 mg Subcutaneous Q24H   guaiFENesin  600 mg Oral BID   ipratropium-albuterol  3 mL Nebulization Q4H   methylPREDNISolone (SOLU-MEDROL) injection  80 mg Intravenous Q24H   omega-3 acid  ethyl esters  1,000 mg Oral Daily   pantoprazole  40 mg Oral Daily   Pirfenidone  3 capsule Oral TID AC   roflumilast  500 mcg Oral Daily   Treprostinil  18 mcg Inhalation QID   Continuous Infusions:  azithromycin Stopped (11/17/23 2106)     Anti-infectives (From admission, onward)    Start     Dose/Rate Route Frequency Ordered Stop   11/17/23 2130  cefTRIAXone (ROCEPHIN) 1 g in sodium chloride 0.9 % 100 mL IVPB  Status:  Discontinued        1 g 200 mL/hr over 30 Minutes Intravenous Every 24 hours 11/16/23 2330 11/17/23 1346   11/17/23 2000  azithromycin (ZITHROMAX) 500 mg in sodium chloride 0.9 % 250 mL IVPB        500 mg 250 mL/hr over 60 Minutes Intravenous Every 24 hours 11/17/23 0844 11/20/23 1959   11/16/23 2100  cefTRIAXone (ROCEPHIN) 2 g in sodium chloride 0.9 % 100 mL IVPB        2 g 200 mL/hr over 30 Minutes Intravenous  Once 11/16/23 2055 11/16/23 2149   11/16/23 2100  azithromycin (ZITHROMAX) 500 mg in sodium chloride 0.9 % 250 mL IVPB        500 mg 250 mL/hr over 60 Minutes Intravenous  Once 11/16/23 2055 11/16/23 2337  Family Communication/Anticipated D/C date and plan/Code Status   DVT prophylaxis: enoxaparin (LOVENOX) injection 40 mg Start: 11/16/23 2345     Code Status: Full Code  Family Communication: Plan discussed with his wife at the bedside  Disposition Plan: Plan to discharge home   Status is: Inpatient Remains inpatient appropriate because: Acute respiratory failure       Subjective:   Interval events noted.  He complains of cough and shortness of breath.  He feels a little better compared to yesterday.  He was worried that he had not gotten his pirfenidone this morning.  His wife was at the bedside.  Objective:    Vitals:   11/18/23 0200 11/18/23 0213 11/18/23 0300 11/18/23 0700  BP: 135/75  (!) 155/76 (!) 145/77  Pulse: (!) 58  (!) 56 69  Resp: (!) 23  15 17   Temp:  98 F (36.7 C)    TempSrc:  Oral    SpO2:  100%  95% 92%   No data found.   Intake/Output Summary (Last 24 hours) at 11/18/2023 0903 Last data filed at 11/17/2023 0943 Gross per 24 hour  Intake 800 ml  Output --  Net 800 ml   There were no vitals filed for this visit.  Exam:  GEN: NAD SKIN: Warm and dry EYES: No pallor or icterus ENT: MMM CV: RRR PULM: Bilateral fine rales, bilateral rhonchi ABD: soft, ND, NT, +BS CNS: AAO x 3, non focal EXT: No edema or tenderness        Data Reviewed:   I have personally reviewed following labs and imaging studies:  Labs: Labs show the following:   Basic Metabolic Panel: Recent Labs  Lab 11/16/23 2103 11/17/23 0519  NA 143 141  K 3.0* 3.8  CL 108 109  CO2 25 25  GLUCOSE 109* 162*  BUN 12 13  CREATININE 0.78 0.75  CALCIUM 8.4* 8.3*   GFR Estimated Creatinine Clearance: 102.5 mL/min (by C-G formula based on SCr of 0.75 mg/dL). Liver Function Tests: Recent Labs  Lab 11/16/23 2103  AST 20  ALT 31  ALKPHOS 60  BILITOT 0.9  PROT 6.6  ALBUMIN 3.4*   No results for input(s): "LIPASE", "AMYLASE" in the last 168 hours. No results for input(s): "AMMONIA" in the last 168 hours. Coagulation profile No results for input(s): "INR", "PROTIME" in the last 168 hours.  CBC: Recent Labs  Lab 11/16/23 2103 11/17/23 0519  WBC 8.0 5.1  NEUTROABS 5.1  --   HGB 14.2 13.2  HCT 41.7 39.3  MCV 101.7* 103.7*  PLT 135* 122*   Cardiac Enzymes: No results for input(s): "CKTOTAL", "CKMB", "CKMBINDEX", "TROPONINI" in the last 168 hours. BNP (last 3 results) No results for input(s): "PROBNP" in the last 8760 hours. CBG: No results for input(s): "GLUCAP" in the last 168 hours. D-Dimer: No results for input(s): "DDIMER" in the last 72 hours. Hgb A1c: No results for input(s): "HGBA1C" in the last 72 hours. Lipid Profile: No results for input(s): "CHOL", "HDL", "LDLCALC", "TRIG", "CHOLHDL", "LDLDIRECT" in the last 72 hours. Thyroid function studies: No results for  input(s): "TSH", "T4TOTAL", "T3FREE", "THYROIDAB" in the last 72 hours.  Invalid input(s): "FREET3" Anemia work up: No results for input(s): "VITAMINB12", "FOLATE", "FERRITIN", "TIBC", "IRON", "RETICCTPCT" in the last 72 hours. Sepsis Labs: Recent Labs  Lab 11/16/23 2103 11/17/23 0519  PROCALCITON  --  <0.10  WBC 8.0 5.1    Microbiology Recent Results (from the past 240 hours)  Resp panel by RT-PCR (RSV, Flu  A&B, Covid) Anterior Nasal Swab     Status: None   Collection Time: 11/16/23  9:03 PM   Specimen: Anterior Nasal Swab  Result Value Ref Range Status   SARS Coronavirus 2 by RT PCR NEGATIVE NEGATIVE Final    Comment: (NOTE) SARS-CoV-2 target nucleic acids are NOT DETECTED.  The SARS-CoV-2 RNA is generally detectable in upper respiratory specimens during the acute phase of infection. The lowest concentration of SARS-CoV-2 viral copies this assay can detect is 138 copies/mL. A negative result does not preclude SARS-Cov-2 infection and should not be used as the sole basis for treatment or other patient management decisions. A negative result may occur with  improper specimen collection/handling, submission of specimen other than nasopharyngeal swab, presence of viral mutation(s) within the areas targeted by this assay, and inadequate number of viral copies(<138 copies/mL). A negative result must be combined with clinical observations, patient history, and epidemiological information. The expected result is Negative.  Fact Sheet for Patients:  BloggerCourse.com  Fact Sheet for Healthcare Providers:  SeriousBroker.it  This test is no t yet approved or cleared by the [place] FDA and  has been authorized for detection and/or diagnosis of SARS-CoV-2 by FDA under an Emergency Use Authorization (EUA). This EUA will remain  in effect (meaning this test can be used) for the duration of the COVID-19 declaration under Section  564(b)(1) of the Act, 21 U.S.C.section 360bbb-3(b)(1), unless the authorization is terminated  or revoked sooner.       Influenza A by PCR NEGATIVE NEGATIVE Final   Influenza B by PCR NEGATIVE NEGATIVE Final    Comment: (NOTE) The Xpert Xpress SARS-CoV-2/FLU/RSV plus assay is intended as an aid in the diagnosis of influenza from Nasopharyngeal swab specimens and should not be used as a sole basis for treatment. Nasal washings and aspirates are unacceptable for Xpert Xpress SARS-CoV-2/FLU/RSV testing.  Fact Sheet for Patients: BloggerCourse.com  Fact Sheet for Healthcare Providers: SeriousBroker.it  This test is not yet approved or cleared by the [place] FDA and has been authorized for detection and/or diagnosis of SARS-CoV-2 by FDA under an Emergency Use Authorization (EUA). This EUA will remain in effect (meaning this test can be used) for the duration of the COVID-19 declaration under Section 564(b)(1) of the Act, 21 U.S.C. section 360bbb-3(b)(1), unless the authorization is terminated or revoked.     Resp Syncytial Virus by PCR NEGATIVE NEGATIVE Final    Comment: (NOTE) Fact Sheet for Patients: BloggerCourse.com  Fact Sheet for Healthcare Providers: SeriousBroker.it  This test is not yet approved or cleared by the [place] FDA and has been authorized for detection and/or diagnosis of SARS-CoV-2 by FDA under an Emergency Use Authorization (EUA). This EUA will remain in effect (meaning this test can be used) for the duration of the COVID-19 declaration under Section 564(b)(1) of the Act, 21 U.S.C. section 360bbb-3(b)(1), unless the authorization is terminated or revoked.  Performed at [facility], [address] [place], [place] [address]   Respiratory (~20 pathogens) panel by PCR     Status: None   Collection Time: 11/17/23  9:43 AM   Specimen:  Nasopharyngeal Swab; Respiratory  Result Value Ref Range Status   Adenovirus NOT DETECTED NOT DETECTED Final   Coronavirus 229E NOT DETECTED NOT DETECTED Final    Comment: (NOTE) The Coronavirus on the Respiratory Panel, DOES NOT test for the novel  Coronavirus (2019 nCoV)    Coronavirus HKU1 NOT DETECTED NOT DETECTED Final   Coronavirus NL63 NOT DETECTED  NOT DETECTED Final   Coronavirus OC43 NOT DETECTED NOT DETECTED Final   Metapneumovirus NOT DETECTED NOT DETECTED Final   Rhinovirus / Enterovirus NOT DETECTED NOT DETECTED Final   Influenza A NOT DETECTED NOT DETECTED Final   Influenza B NOT DETECTED NOT DETECTED Final   Parainfluenza Virus 1 NOT DETECTED NOT DETECTED Final   Parainfluenza Virus 2 NOT DETECTED NOT DETECTED Final   Parainfluenza Virus 3 NOT DETECTED NOT DETECTED Final   Parainfluenza Virus 4 NOT DETECTED NOT DETECTED Final   Respiratory Syncytial Virus NOT DETECTED NOT DETECTED Final   Bordetella pertussis NOT DETECTED NOT DETECTED Final   Bordetella Parapertussis NOT DETECTED NOT DETECTED Final   Chlamydophila pneumoniae NOT DETECTED NOT DETECTED Final   Mycoplasma pneumoniae NOT DETECTED NOT DETECTED Final    Comment: Performed at [facility], [address], [place], [place] [address]    Procedures and diagnostic studies:  DG Chest Portable 1 View Result Date: 11/16/2023 CLINICAL DATA:  Shortness of breath, COPD exacerbation, evaluate for pneumonia with new hypoxia from baseline EXAM: PORTABLE CHEST 1 VIEW COMPARISON:  Chest x-ray 10/24/2022 FINDINGS: The heart and mediastinal contours are unchanged. No focal consolidation. Chronic coarsened interstitial markings with no overt pulmonary edema. No pleural effusion. No pneumothorax. No acute osseous abnormality. IMPRESSION: No active disease in a patient with chronic interstitial lung disease. Electronically Signed   By: Tish Frederickson M.D.   On: 11/16/2023 21:54               LOS: 2 days    Kayda Allers  [facility]   Pager on [email or web address]. If 7PM-7AM, please contact night-coverage at [email or web address]     11/18/2023, 9:03 AM

## 2023-11-18 NOTE — Progress Notes (Signed)
PULMONOLOGY         Date: 11/18/2023,   MRN# [id number] Ridwan Bondy [facility] 11-10-1955     Admission                  Current   Referring provider: Dr Nelson Chimes   CHIEF COMPLAINT:    Acute on chronic hypoxemic respiratory failure   HISTORY OF PRESENT ILLNESS   68 yo M with advanced COPD chronic hypoxemia pulmonary fibrosis chronic cough GERD came in with dyspnea , worsening cough and respiratory distress.  He was placed on BIPAP and required 60% fio2 to reach normal oxygen saturation.  He was given rocephin and zithromax empirically for CAP. His covid panel is negative. 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 For pulmonary fibrosis he takes pirfenidone TID.  He has ILD associated PH and takes tyvasso for this.   11/18/23- patient reports "puffy feet", he does have trace pedal edema.  His TTE was done 07/2023 without concerning findings.  He does have chronic aortic aneurysmal dilation which is something he has followed up on outpatient.  Ill order BNP today and repeat Echo since family is concerned regarding cardiac dysfunction.  His Troponin was 24 with normal renal function on this admission. He is on 8L/min and was able to ambulate to bathroom.  I reviewed case with daughter Clydie Braun and wife at bedside.    PAST MEDICAL HISTORY   Past Medical History:  Diagnosis Date   Chronic cough    COPD (chronic obstructive pulmonary disease) (HCC)    Coronary artery disease    Edentulous    Elevated cholesterol    GERD (gastroesophageal reflux disease)    Myocardial infarction (HCC)    Pulmonary fibrosis (HCC)    Restless leg syndrome    Supplemental oxygen dependent      SURGICAL HISTORY   Past Surgical History:  Procedure Laterality Date   ANGIOPLASTY  2010   2 stents placed   BACK SURGERY     cardiac stents     x2   CATARACT EXTRACTION W/PHACO Left 08/20/2023   Procedure: CATARACT EXTRACTION PHACO AND INTRAOCULAR LENS PLACEMENT (IOC) LEFT 16.87 01:21.8;  Surgeon: Estanislado Pandy, MD;   Location: [facility];  Service: Ophthalmology;  Laterality: Left;   CATARACT EXTRACTION W/PHACO Right 09/03/2023   Procedure: CATARACT EXTRACTION PHACO AND INTRAOCULAR LENS PLACEMENT (IOC) RIGHT 19.41 01:30.0;  Surgeon: Estanislado Pandy, MD;  Location: [facility];  Service: Ophthalmology;  Laterality: Right;   COLONOSCOPY     COLONOSCOPY WITH PROPOFOL N/A 03/05/2018   Procedure: COLONOSCOPY WITH PROPOFOL;  Surgeon: Pasty Spillers, MD;  Location: [facility] ENDOSCOPY;  Service: Endoscopy;  Laterality: N/A;   COLONOSCOPY WITH PROPOFOL N/A 01/24/2022   Procedure: COLONOSCOPY WITH PROPOFOL;  Surgeon: Wyline Mood, MD;  Location: [facility] ENDOSCOPY;  Service: Gastroenterology;  Laterality: N/A;   CORONARY ANGIOPLASTY     CORONARY/GRAFT ACUTE MI REVASCULARIZATION N/A 09/30/2019   Procedure: Coronary/Graft Acute MI Revascularization;  Surgeon: Iran Ouch, MD;  Location: [facility] INVASIVE CV LAB;  Service: Cardiovascular;  Laterality: N/A;   LEFT HEART CATH AND CORONARY ANGIOGRAPHY N/A 09/30/2019   Procedure: LEFT HEART CATH AND CORONARY ANGIOGRAPHY;  Surgeon: Iran Ouch, MD;  Location: [facility] INV[facility] CV [facility];  Service: Cardiovascular;  Laterality: N/A;   LEFT HEART CATH AND CORS/GRAFTS ANGIOGRAPHY N/A 10/01/2021   Procedure: LEFT HEART CATH AND CORS/GRAFTS ANGIOGRAPHY;  Surgeon: Yvonne Kendall, MD;  Location: [facility] IN[facility] CV LAB;  Service: Cardiovascular;  Laterality:  N/A;     FAMILY HISTORY   Family History  Problem Relation Age of Onset   Heart disease Mother    Heart disease Father      SOCIAL HISTORY   Social History   Tobacco Use   Smoking status: Former    Current packs/day: 0.00    Average packs/day: 2.0 packs/day for 40.0 years (80.0 ttl pk-yrs)    Types: Cigarettes    Start date: 30    Quit date: 2013    Years since quitting: 12.0   Smokeless tobacco: Never  Vaping Use   Vaping status: Never Used  Substance Use Topics   Alcohol use: Not  Currently    Comment: occassionally - 1 beer/month or less   Drug use: Not Currently     MEDICATIONS    Home Medication:  Current Outpatient Rx   Order #: [id number] Class: Historical Med   Order #: [id number] Class: Historical Med   Order #: [id number] Class: Historical Med   Order #: [id number] Class: Normal   Order #: [id number] Class: Normal   Order #: [id number] Class: Normal   Order #: [id number] Class: Normal   Order #: [id number] Class: Normal   Order #: [id number] Class: Historical Med   Order #: [id number] Class: Historical Med   Order #: [id number] Class: Historical Med   Order #: [id number] Class: Historical Med   Order #: [id number] Class: Historical Med   Order #: [id number] Class: Historical Med   Order #: [id number] Class: Normal    Current Medication:  Current Facility-Administered Medications:    acetaminophen (TYLENOL) tablet 650 mg, 650 mg, Oral, Q6H PRN, 650 mg at 11/17/23 0019 **OR** acetaminophen (TYLENOL) suppository 650 mg, 650 mg, Rectal, Q6H PRN, Mansy, Jan A, MD   atorvastatin (LIPITOR) tablet 40 mg, 40 mg, Oral, Daily, Mansy, Jan A, MD, 40 mg at 11/17/23 9381   azithromycin (ZITHROMAX) 500 mg in sodium chloride 0.9 % 250 mL IVPB, 500 mg, Intravenous, Q24H, Arnetha Courser, MD, Stopped at 11/17/23 2106   carvedilol (COREG) tablet 6.25 mg, 6.25 mg, Oral, BID WC, Mansy, Jan A, MD, 6.25 mg at 11/17/23 1627   chlorpheniramine-HYDROcodone (TUSSIONEX) 10-8 MG/5ML suspension 5 mL, 5 mL, Oral, Q12H PRN, Mansy, Jan A, MD   clopidogrel (PLAVIX) tablet 37.5 mg, 37.5 mg, Oral, QODAY, Mansy, Jan A, MD, 37.5 mg at 11/17/23 1123   enoxaparin (LOVENOX) injection 40 mg, 40 mg, Subcutaneous, Q24H, Mansy, Jan A, MD, 40 mg at 11/17/23 2322   guaiFENesin (MUCINEX) 12 hr tablet 600 mg, 600 mg, Oral, BID, Mansy, Jan A, MD, 600 mg at 11/17/23 2150   ipratropium-albuterol (DUONEB) 0.5-2.5 (3) MG/3ML nebulizer solution 3 mL, 3 mL, Nebulization, Q4H, Amin, Sumayya, MD, 3 mL at 11/17/23 2322   magnesium  hydroxide (MILK OF MAGNESIA) suspension 30 mL, 30 mL, Oral, Daily PRN, Mansy, Jan A, MD   methylPREDNISolone sodium succinate (SOLU-MEDROL) 125 mg/2 mL injection 80 mg, 80 mg, Intravenous, Q24H, Hallaji, Sheema M, RPH, 80 mg at 11/17/23 0937   nitroGLYCERIN (NITROSTAT) SL tablet 0.4 mg, 0.4 mg, Sublingual, Q5 Min x 3 PRN, Mansy, Jan A, MD   omega-3 acid ethyl esters (LOVAZA) capsule 1,000 mg, 1,000 mg, Oral, Daily, Mansy, Jan A, MD, 1,000 mg at 11/17/23 0937   ondansetron (ZOFRAN) tablet 4 mg, 4 mg, Oral, Q6H PRN **OR** ondansetron (ZOFRAN) injection 4 mg, 4 mg, Intravenous, Q6H PRN, Mansy, Jan A, MD   pantoprazole (PROTONIX) EC tablet 40 mg, 40 mg, Oral, Daily, Mansy, Jan A, MD, 40 mg at 11/17/23 0175   Pirfenidone CAPS 3 capsule, 3 capsule,  Oral, TID AC, Nazari, Walid A, RPH   roflumilast (DALIRESP) tablet 500 mcg, 500 mcg, Oral, Daily, Mansy, Jan A, MD, 500 mcg at 11/17/23 1610   traZODone (DESYREL) tablet 25 mg, 25 mg, Oral, QHS PRN, Mansy, Jan A, MD   Treprostinil (TYVASO) inhalation solution 18 mcg, 18 mcg, Inhalation, QID, Mansy, Jan A, MD, 18 mcg at 11/17/23 1916  Current Outpatient Medications:    acetaminophen (TYLENOL) 500 MG tablet, Take 1,000 mg by mouth every 8 (eight) hours as needed for moderate pain., Disp: , Rfl:    albuterol (PROVENTIL) (2.5 MG/3ML) 0.083% nebulizer solution, SMARTSIG:3 Milliliter(s) Via Nebulizer Every 6 Hours PRN, Disp: , Rfl:    ALPHA LIPOIC ACID PO, Take 500-1,000 mg by mouth daily., Disp: , Rfl:    atorvastatin (LIPITOR) 40 MG tablet, TAKE 1 TABLET BY MOUTH EVERY DAY, Disp: 90 tablet, Rfl: 1   carvedilol (COREG) 6.25 MG tablet, Take 1 tablet (6.25 mg total) by mouth 2 (two) times daily with a meal., Disp: 180 tablet, Rfl: 3   clopidogrel (PLAVIX) 75 MG tablet, TAKE 1/2 TABLET (37.5 MG TOTAL) BY MOUTH EVERY OTHER DAY., Disp: 22.5 tablet, Rfl: 0   Fluticasone-Umeclidin-Vilant (TRELEGY ELLIPTA) 100-62.5-25 MCG/ACT AEPB, Inhale 1 puff into the lungs daily.,  Disp: 60 each, Rfl: 1   nitroGLYCERIN (NITROSTAT) 0.4 MG SL tablet, Place 1 tablet (0.4 mg total) under the tongue every 5 (five) minutes x 3 doses as needed for chest pain., Disp: 25 tablet, Rfl: 3   pantoprazole (PROTONIX) 40 MG tablet, Take 40 mg by mouth daily., Disp: , Rfl:    Pirfenidone (ESBRIET) 267 MG CAPS, Take 3 capsules by mouth 3 (three) times daily., Disp: , Rfl:    predniSONE (DELTASONE) 20 MG tablet, Take 20 mg by mouth daily., Disp: , Rfl:    roflumilast (DALIRESP) 500 MCG TABS tablet, Take 1 tablet by mouth daily. Every other day, Disp: , Rfl:    Treprostinil (TYVASO STARTER KIT) 0.6 MG/ML SOLN, Inhale 18 mcg into the lungs 4 (four) times daily., Disp: , Rfl:    OXYGEN, Inhale 4 L into the lungs continuous., Disp: , Rfl:    triamcinolone ointment (KENALOG) 0.1 %, APPLY TO ITCHY AREAS AT NECK AND BODY TWICE A DAY FOR UP TO 2 WEEKS. AVOID APPLYING TO FACE, GROIN, AND AXILLA. USE AS DIRECTED. LONG-TERM USE CAN CAUSE THINNING OF THE SKIN. (Patient not taking: Reported on 11/17/2023), Disp: 454 g, Rfl: 0    ALLERGIES   Ace inhibitors and Cinnamon     REVIEW OF SYSTEMS    Review of Systems:  Gen:  Denies  fever, sweats, chills weigh loss  HEENT: Denies blurred vision, double vision, ear pain, eye pain, hearing loss, nose bleeds, sore throat Cardiac:  No dizziness, chest pain or heaviness, chest tightness,edema Resp:   reports dyspnea chronically  Gi: Denies swallowing difficulty, stomach pain, nausea or vomiting, diarrhea, constipation, bowel incontinence Gu:  Denies bladder incontinence, burning urine Ext:   Denies Joint pain, stiffness or swelling Skin: Denies  skin rash, easy bruising or bleeding or hives Endoc:  Denies polyuria, polydipsia , polyphagia or weight change Psych:   Denies depression, insomnia or hallucinations   Other:  All other systems negative   VS: BP (!) 145/77   Pulse 69   Temp 98 F (36.7 C) (Oral)   Resp 17   SpO2 92%      PHYSICAL  EXAM    GENERAL:NAD, no fevers, chills, no weakness no fatigue HEAD: Normocephalic, atraumatic.  EYES: Pupils equal, round, reactive to light. Extraocular muscles intact. No scleral icterus.  MOUTH: Moist mucosal membrane. Dentition intact. No abscess noted.  EAR, NOSE, THROAT: Clear without exudates. No external lesions.  NECK: Supple. No thyromegaly. No nodules. No JVD.  PULMONARY: decreased breath sounds with mild rhonchi worse at bases bilaterally.  CARDIOVASCULAR: S1 and S2. Regular rate and rhythm. No murmurs, rubs, or gallops. No edema. Pedal pulses 2+ bilaterally.  GASTROINTESTINAL: Soft, nontender, nondistended. No masses. Positive bowel sounds. No hepatosplenomegaly.  MUSCULOSKELETAL: No swelling, clubbing, or edema. Range of motion full in all extremities.  NEUROLOGIC: Cranial nerves II through XII are intact. No gross focal neurological deficits. Sensation intact. Reflexes intact.  SKIN: No ulceration, lesions, rashes, or cyanosis. Skin warm and dry. Turgor intact.  PSYCHIATRIC: Mood, affect within normal limits. The patient is awake, alert and oriented x 3. Insight, judgment intact.       IMAGING     ative & Impression  CLINICAL DATA:  Acute aortic syndrome suspected history of aneurysm   EXAM: CT ANGIOGRAPHY CHEST WITH CONTRAST   TECHNIQUE: Multidetector CT imaging of the chest was performed using the standard protocol during bolus administration of intravenous contrast. Multiplanar CT image reconstructions and MIPs were obtained to evaluate the vascular anatomy.   RADIATION DOSE REDUCTION: This exam was performed according to the departmental dose-optimization program which includes automated exposure control, adjustment of the mA and/or kV according to patient size and/or use of iterative reconstruction technique.   CONTRAST:  OMNIPAQUE IOHEXOL 350 MG/ML SOLN   COMPARISON:  CT 10/24/2022, 07/08/2021   FINDINGS: Cardiovascular: Non contrasted images  of the chest demonstrate no acute intramural hematoma. Mild aortic atherosclerosis. No aneurysm. Coronary vascular calcification. Normal cardiac size. No pericardial effusion. No dissection is seen   Mediastinum/Nodes: Patent trachea. No suspicious thyroid mass. Borderline mediastinal nodes mildly decreased compared to prior. Esophagus within normal limits.   Lungs/Pleura: Emphysema and fibrotic lung disease. No acute airspace disease, pleural effusion or pneumothorax. Scarring in the left upper lobe.   Upper Abdomen: No acute finding   Musculoskeletal: No acute osseous abnormality.   Review of the MIP images confirms the above findings.   IMPRESSION: 1. Negative for acute aortic dissection or aneurysm in the chest. Follow-up dedicated imaging of the abdomen and pelvis may be performed if indicated. 2. Emphysema and fibrotic lung disease. 3. Aortic atherosclerosis.   Aortic Atherosclerosis (ICD10-I70.0) and Emphysema (ICD10-J43.9).   These results will be called to the ordering clinician or representative by the Radiologist Assistant, and communication documented in the PACS or [employer].     Electronically Signed   By: Jasmine Pang M.D.   On: 10/02/2023 19:26    ASSESSMENT/PLAN   Acute exacerbation of ILD and COPD    - continue perfenidone TID    Agree with solumedrol 80mg  daily >>weaning to 40mg  today   - duoneb nebulizer therapy   - rocephin and zithromax empirically -narrowed to po zithromax 250dailly     -CRP trend daily   -continue O2 nasal canula with weaning to spO2 goal at >88%  Advanced COPD  COPD carepath as above   - continue Daliresp   - steroids and antimicrobials as current   - incentive spirometry   Chronic pulmonary hypertension Due to ILD with pulmonary fibrosis Tyvasso QID         Thank you for allowing me to participate in the care of this patient.   Patient/Family are satisfied with care plan and  all questions have  been answered.    Provider disclosure: Patient with at least one acute or chronic illness or injury that poses a threat to life or bodily function and is being managed actively during this encounter.  All of the below services have been performed independently by signing provider:  review of prior documentation from internal and or external health records.  Review of previous and current lab results.  Interview and comprehensive assessment during patient visit today. Review of current and previous chest radiographs/CT scans. Discussion of management and test interpretation with health care team and patient/family.   This document was prepared using Dragon voice recognition software and may include unintentional dictation errors.     Vida Rigger, M.D.  Division of Pulmonary & Critical Care Medicine

## 2023-11-18 NOTE — ED Notes (Signed)
This Registered Nurse (RN) assumed responsibility for the care of the assigned patient at 1900 on 11/18/23. All nursing tasks, documentation, and responsibilities prior to this time are not the responsibility of this RN. Any assessments, interventions, or documentation completed before this time are not within the scope of this RN's duties.  Effective 1900 on 11/18/23, this RN is now accountable for all aspects of patient care, including but not limited to assessments, interventions, documentation, medication administration, and care coordination. All future tasks and documentation will be managed and completed by this RN from this time until care handoff at 0700 on 11/19/23.

## 2023-11-19 ENCOUNTER — Inpatient Hospital Stay (HOSPITAL_COMMUNITY)
Admit: 2023-11-19 | Discharge: 2023-11-19 | Disposition: A | Discharge status: Home / Self Care | Payer: Medicare Other | Attending: Pulmonary Disease | Admitting: Pulmonary Disease

## 2023-11-19 DIAGNOSIS — J811 Chronic pulmonary edema: Secondary | ICD-10-CM

## 2023-11-19 DIAGNOSIS — J9601 Acute respiratory failure with hypoxia: Secondary | ICD-10-CM

## 2023-11-19 LAB — BRAIN NATRIURETIC PEPTIDE: B Natriuretic Peptide: 64.1 pg/mL (ref 0.0–100.0)

## 2023-11-19 LAB — ECHOCARDIOGRAM COMPLETE BUBBLE STUDY
AR max vel: 2.54 cm2
AV Area VTI: 2.43 cm2
AV Area mean vel: 2.31 cm2
AV Mean grad: 6 mm[Hg]
AV Peak grad: 10 mm[Hg]
Ao pk vel: 1.58 m/s
Area-P 1/2: 4.71 cm2
Calc EF: 61.5 %
MV VTI: 2.72 cm2
S' Lateral: 3.8 cm
Single Plane A2C EF: 52.8 %
Single Plane A4C EF: 67.4 %

## 2023-11-19 LAB — C-REACTIVE PROTEIN: CRP: 0.7 mg/dL (ref <1.0)

## 2023-11-19 MED ORDER — IPRATROPIUM-ALBUTEROL 0.5-2.5 (3) MG/3ML IN SOLN
3.0000 mL | Freq: Four times a day (QID) | RESPIRATORY_TRACT | Status: DC
  Administered 2023-11-19: 3 mL via RESPIRATORY_TRACT
  Filled 2023-11-19: qty 3

## 2023-11-19 MED ORDER — IPRATROPIUM-ALBUTEROL 0.5-2.5 (3) MG/3ML IN SOLN
3.0000 mL | Freq: Two times a day (BID) | RESPIRATORY_TRACT | Status: DC
  Administered 2023-11-19 – 2023-11-20 (×2): 3 mL via RESPIRATORY_TRACT
  Filled 2023-11-19 (×2): qty 3

## 2023-11-19 NOTE — Progress Notes (Signed)
Progress Note    Dillon [name]  NWG:[id number] DOB: Sep 09, 1956  DOA: 11/16/2023 PCP: Corky Downs, MD      Brief Narrative:    Medical records reviewed and are as summarized below:  Dillon [name] is a 68 y.o. male  with medical history significant for COPD, GERD, pulmonary fibrosis, restless leg syndrome and coronary artery disease, who presented to the emergency room with acute onset of dyspnea, cough with inability to expectorate mild chills of about 4 to 5 days duration.        Assessment/Plan:   Principal Problem:   Acute respiratory failure with hypoxia (HCC) Active Problems:   COPD with acute exacerbation (HCC)   Essential hypertension   Dyslipidemia   Pulmonary hypertension (HCC)   GERD without esophagitis   Coronary artery disease    Acute on chronic respiratory failure with hypoxia (HCC) Initially required BiPAP.  Oxygen therapy has been weaned down from 8 L to 6 L/min.  Wean down to 4 L oxygen as able.  Uses 4 L at baseline.  Follow-up with pulmonologist.   Acute exacerbation of COPD and interstitial lung disease  Continue azithromycin, bronchodilators IV Solu-Medrol.    Pulmonary hypertension (HCC) - Continue pirfenidone and Tyvaso.    Comorbidities include hypertension, dyslipidemia, CAD on Plavix, GERD      Diet Order             Diet Heart Room service appropriate? Yes; Fluid consistency: Thin  Diet effective now                            Consultants: Pulmonologist  Procedures: None    Medications:    atorvastatin  40 mg Oral Daily   azithromycin  250 mg Oral Daily   carvedilol  6.25 mg Oral BID WC   clopidogrel  37.5 mg Oral QODAY   enoxaparin (LOVENOX) injection  40 mg Subcutaneous Q24H   fluticasone furoate-vilanterol  1 puff Inhalation Daily   guaiFENesin  600 mg Oral BID   ipratropium-albuterol  3 mL Nebulization QID   methylPREDNISolone (SOLU-MEDROL) injection  40 mg Intravenous Q24H   omega-3  acid ethyl esters  1,000 mg Oral Daily   pantoprazole  40 mg Oral Daily   Pirfenidone  3 capsule Oral TID AC   [START ON 11/20/2023] roflumilast  500 mcg Oral QODAY   Treprostinil  18 mcg Inhalation QID   Continuous Infusions:     Anti-infectives (From admission, onward)    Start     Dose/Rate Route Frequency Ordered Stop   11/18/23 1000  azithromycin (ZITHROMAX) tablet 250 mg        250 mg Oral Daily 11/18/23 0949     11/17/23 2130  cefTRIAXone (ROCEPHIN) 1 g in sodium chloride 0.9 % 100 mL IVPB  Status:  Discontinued        1 g 200 mL/hr over 30 Minutes Intravenous Every 24 hours 11/16/23 2330 11/17/23 1346   11/17/23 2000  azithromycin (ZITHROMAX) 500 mg in sodium chloride 0.9 % 250 mL IVPB  Status:  Discontinued        500 mg 250 mL/hr over 60 Minutes Intravenous Every 24 hours 11/17/23 0844 11/18/23 0949   11/16/23 2100  cefTRIAXone (ROCEPHIN) 2 g in sodium chloride 0.9 % 100 mL IVPB        2 g 200 mL/hr over 30 Minutes Intravenous  Once 11/16/23 2055 11/16/23 2149   11/16/23 2100  azithromycin (ZITHROMAX) 500 mg in sodium chloride 0.9 % 250 mL IVPB        500 mg 250 mL/hr over 60 Minutes Intravenous  Once 11/16/23 2055 11/16/23 2337              Family Communication/Anticipated D/C date and plan/Code Status   DVT prophylaxis: enoxaparin (LOVENOX) injection 40 mg Start: 11/16/23 2345     Code Status: Full Code  Family Communication: None Disposition Plan: Plan to discharge home   Status is: Inpatient Remains inpatient appropriate because: Acute respiratory failure       Subjective:   Interval events noted.  No new complaints.  He feels better.  Breathing is better.  He still has a cough.  He had to use a walker for ambulation this morning although he does not use a walker at home.  Objective:    Vitals:   11/19/23 0831 11/19/23 0908 11/19/23 1107 11/19/23 1245  BP: 112/69  132/82   Pulse: 73 74 73   Resp: 15 18 16    Temp: 98 F (36.7 C)  98.6  F (37 C)   TempSrc:      SpO2: 96% 93% 96% 94%  Weight:      Height:       No data found.   Intake/Output Summary (Last 24 hours) at 11/19/2023 1310 Last data filed at 11/19/2023 0900 Gross per 24 hour  Intake 100 ml  Output 400 ml  Net -300 ml   Filed Weights   11/18/23 1914 11/19/23 0037  Weight: 80.7 kg 83.4 kg    Exam:  GEN: NAD SKIN: Warm and dry EYES: No pallor or icterus ENT: MMM CV: RRR PULM: Bilateral rales, bilateral rhonchi ABD: soft, ND, NT, +BS CNS: AAO x 3, non focal EXT: No edema or tenderness        Data Reviewed:   I have personally reviewed following labs and imaging studies:  Labs: Labs show the following:   Basic Metabolic Panel: Recent Labs  Lab 11/16/23 2103 11/17/23 0519  NA 143 141  K 3.0* 3.8  CL 108 109  CO2 25 25  GLUCOSE 109* 162*  BUN 12 13  CREATININE 0.78 0.75  CALCIUM 8.4* 8.3*   GFR Estimated Creatinine Clearance: 104.2 mL/min (by C-G formula based on SCr of 0.75 mg/dL). Liver Function Tests: Recent Labs  Lab 11/16/23 2103  AST 20  ALT 31  ALKPHOS 60  BILITOT 0.9  PROT 6.6  ALBUMIN 3.4*   No results for input(s): "LIPASE", "AMYLASE" in the last 168 hours. No results for input(s): "AMMONIA" in the last 168 hours. Coagulation profile No results for input(s): "INR", "PROTIME" in the last 168 hours.  CBC: Recent Labs  Lab 11/16/23 2103 11/17/23 0519  WBC 8.0 5.1  NEUTROABS 5.1  --   HGB 14.2 13.2  HCT 41.7 39.3  MCV 101.7* 103.7*  PLT 135* 122*   Cardiac Enzymes: No results for input(s): "CKTOTAL", "CKMB", "CKMBINDEX", "TROPONINI" in the last 168 hours. BNP (last 3 results) No results for input(s): "PROBNP" in the last 8760 hours. CBG: No results for input(s): "GLUCAP" in the last 168 hours. D-Dimer: No results for input(s): "DDIMER" in the last 72 hours. Hgb A1c: No results for input(s): "HGBA1C" in the last 72 hours. Lipid Profile: No results for input(s): "CHOL", "HDL", "LDLCALC",  "TRIG", "CHOLHDL", "LDLDIRECT" in the last 72 hours. Thyroid function studies: No results for input(s): "TSH", "T4TOTAL", "T3FREE", "THYROIDAB" in the last 72 hours.  Invalid input(s): "FREET3"  Anemia work up: No results for input(s): "VITAMINB12", "FOLATE", "FERRITIN", "TIBC", "IRON", "RETICCTPCT" in the last 72 hours. Sepsis Labs: Recent Labs  Lab 11/16/23 2103 11/17/23 0519  PROCALCITON  --  <0.10  WBC 8.0 5.1    Microbiology Recent Results (from the past 240 hours)  Resp panel by RT-PCR (RSV, Flu A&B, Covid) Anterior Nasal Swab     Status: None   Collection Time: 11/16/23  9:03 PM   Specimen: Anterior Nasal Swab  Result Value Ref Range Status   SARS Coronavirus 2 by RT PCR NEGATIVE NEGATIVE Final    Comment: (NOTE) SARS-CoV-2 target nucleic acids are NOT DETECTED.  The SARS-CoV-2 RNA is generally detectable in upper respiratory specimens during the acute phase of infection. The lowest concentration of SARS-CoV-2 viral copies this assay can detect is 138 copies/mL. A negative result does not preclude SARS-Cov-2 infection and should not be used as the sole basis for treatment or other patient management decisions. A negative result may occur with  improper specimen collection/handling, submission of specimen other than nasopharyngeal swab, presence of viral mutation(s) within the areas targeted by this assay, and inadequate number of viral copies(<138 copies/mL). A negative result must be combined with clinical observations, patient history, and epidemiological information. The expected result is Negative.  Fact Sheet for Patients:  BloggerCourse.com  Fact Sheet for Healthcare Providers:  SeriousBroker.it  This test is no t yet approved or cleared by the [place] FDA and  has been authorized for detection and/or diagnosis of SARS-CoV-2 by FDA under an Emergency Use Authorization (EUA). This EUA will remain  in  effect (meaning this test can be used) for the duration of the COVID-19 declaration under Section 564(b)(1) of the Act, 21 U.S.C.section 360bbb-3(b)(1), unless the authorization is terminated  or revoked sooner.       Influenza A by PCR NEGATIVE NEGATIVE Final   Influenza B by PCR NEGATIVE NEGATIVE Final    Comment: (NOTE) The Xpert Xpress SARS-CoV-2/FLU/RSV plus assay is intended as an aid in the diagnosis of influenza from Nasopharyngeal swab specimens and should not be used as a sole basis for treatment. Nasal washings and aspirates are unacceptable for Xpert Xpress SARS-CoV-2/FLU/RSV testing.  Fact Sheet for Patients: BloggerCourse.com  Fact Sheet for Healthcare Providers: SeriousBroker.it  This test is not yet approved or cleared by the [place] FDA and has been authorized for detection and/or diagnosis of SARS-CoV-2 by FDA under an Emergency Use Authorization (EUA). This EUA will remain in effect (meaning this test can be used) for the duration of the COVID-19 declaration under Section 564(b)(1) of the Act, 21 U.S.C. section 360bbb-3(b)(1), unless the authorization is terminated or revoked.     Resp Syncytial Virus by PCR NEGATIVE NEGATIVE Final    Comment: (NOTE) Fact Sheet for Patients: BloggerCourse.com  Fact Sheet for Healthcare Providers: SeriousBroker.it  This test is not yet approved or cleared by the [place] FDA and has been authorized for detection and/or diagnosis of SARS-CoV-2 by FDA under an Emergency Use Authorization (EUA). This EUA will remain in effect (meaning this test can be used) for the duration of the COVID-19 declaration under Section 564(b)(1) of the Act, 21 U.S.C. section 360bbb-3(b)(1), unless the authorization is terminated or revoked.  Performed at [facility], [address] [place], [place] [address]    Respiratory (~20 pathogens) panel by PCR     Status: None   Collection Time: 11/17/23  9:43 AM   Specimen: Nasopharyngeal Swab; Respiratory  Result Value Ref  Range Status   Adenovirus NOT DETECTED NOT DETECTED Final   Coronavirus 229E NOT DETECTED NOT DETECTED Final    Comment: (NOTE) The Coronavirus on the Respiratory Panel, DOES NOT test for the novel  Coronavirus (2019 nCoV)    Coronavirus HKU1 NOT DETECTED NOT DETECTED Final   Coronavirus NL63 NOT DETECTED NOT DETECTED Final   Coronavirus OC43 NOT DETECTED NOT DETECTED Final   Metapneumovirus NOT DETECTED NOT DETECTED Final   Rhinovirus / Enterovirus NOT DETECTED NOT DETECTED Final   Influenza A NOT DETECTED NOT DETECTED Final   Influenza B NOT DETECTED NOT DETECTED Final   Parainfluenza Virus 1 NOT DETECTED NOT DETECTED Final   Parainfluenza Virus 2 NOT DETECTED NOT DETECTED Final   Parainfluenza Virus 3 NOT DETECTED NOT DETECTED Final   Parainfluenza Virus 4 NOT DETECTED NOT DETECTED Final   Respiratory Syncytial Virus NOT DETECTED NOT DETECTED Final   Bordetella pertussis NOT DETECTED NOT DETECTED Final   Bordetella Parapertussis NOT DETECTED NOT DETECTED Final   Chlamydophila pneumoniae NOT DETECTED NOT DETECTED Final   Mycoplasma pneumoniae NOT DETECTED NOT DETECTED Final    Comment: Performed at [facility], [address] [place], [place] [address]    Procedures and diagnostic studies:  ECHOCARDIOGRAM COMPLETE BUBBLE STUDY Result Date: 11/19/2023    ECHOCARDIOGRAM REPORT   Patient Name:   Ante RAY Moya Date of Exam: 11/19/2023 Medical Rec #:  [id number]     Height:       74.0 in Accession #:    [id number]    Weight:       183.9 lb Date of Birth:  1956-01-30    BSA:          2.097 m Patient Age:    67 years      BP:           112/69 mmHg Patient Gender: M             HR:           70 bpm. Exam Location:  [facility] Procedure: 2D Echo, Cardiac Doppler and Color Doppler Indications:     Interstitial edema  History:          Patient has prior history of Echocardiogram examinations, most                  recent 07/30/2023. Previous Myocardial Infarction and CAD,                  Pulmonary HTN and COPD, Signs/Symptoms:Chest Pain and Shortness                  of Breath; Risk Factors:Hypertension and Dyslipidemia.  Sonographer:     Mikki Harbor Referring Phys:  [id number] GEXB ALESKEROV Diagnosing Phys: Julien Nordmann MD IMPRESSIONS  1. Left ventricular ejection fraction, by estimation, is 60 to 65%. The left ventricle has normal function. The left ventricle has no regional wall motion abnormalities. Left ventricular diastolic parameters are consistent with Grade I diastolic dysfunction (impaired relaxation).  2. Right ventricular systolic function is normal. The right ventricular size is normal. There is mildly elevated pulmonary artery systolic pressure. The estimated right ventricular systolic pressure is 39.0 mmHg.  3. Left atrial size was mildly dilated.  4. The mitral valve is normal in structure. No evidence of mitral valve regurgitation. No evidence of mitral stenosis.  5. The aortic valve is normal in structure. Aortic valve regurgitation is not visualized. No aortic stenosis is present.  6.  The inferior vena cava is normal in size with greater than 50% respiratory variability, suggesting right atrial pressure of 3 mmHg. FINDINGS  Left Ventricle: Left ventricular ejection fraction, by estimation, is 60 to 65%. The left ventricle has normal function. The left ventricle has no regional wall motion abnormalities. The left ventricular internal cavity size was normal in size. There is  no left ventricular hypertrophy. Left ventricular diastolic parameters are consistent with Grade I diastolic dysfunction (impaired relaxation). Right Ventricle: The right ventricular size is normal. No increase in right ventricular wall thickness. Right ventricular systolic function is normal. There is mildly elevated pulmonary artery systolic pressure.  The tricuspid regurgitant velocity is 3.00  m/s, and with an assumed right atrial pressure of 3 mmHg, the estimated right ventricular systolic pressure is 39.0 mmHg. Left Atrium: Left atrial size was mildly dilated. Right Atrium: Right atrial size was normal in size. Pericardium: There is no evidence of pericardial effusion. Mitral Valve: The mitral valve is normal in structure. No evidence of mitral valve regurgitation. No evidence of mitral valve stenosis. MV peak gradient, 4.0 mmHg. The mean mitral valve gradient is 1.0 mmHg. Tricuspid Valve: The tricuspid valve is normal in structure. Tricuspid valve regurgitation is mild . No evidence of tricuspid stenosis. Aortic Valve: The aortic valve is normal in structure. Aortic valve regurgitation is not visualized. No aortic stenosis is present. Aortic valve mean gradient measures 6.0 mmHg. Aortic valve peak gradient measures 10.0 mmHg. Aortic valve area, by VTI measures 2.43 cm. Pulmonic Valve: The pulmonic valve was normal in structure. Pulmonic valve regurgitation is not visualized. No evidence of pulmonic stenosis. Aorta: The aortic root is normal in size and structure. Venous: The inferior vena cava is normal in size with greater than 50% respiratory variability, suggesting right atrial pressure of 3 mmHg. IAS/Shunts: No atrial level shunt detected by color flow Doppler.  LEFT VENTRICLE PLAX 2D LVIDd:         5.20 cm     Diastology LVIDs:         3.80 cm     LV e' medial:    5.87 cm/s LV PW:         1.10 cm     LV E/e' medial:  11.2 LV IVS:        1.10 cm     LV e' lateral:   9.46 cm/s LVOT diam:     2.10 cm     LV E/e' lateral: 6.9 LV SV:         94 LV SV Index:   45 LVOT Area:     3.46 cm  LV Volumes (MOD) LV vol d, MOD A2C: 63.3 ml LV vol d, MOD A4C: 65.3 ml LV vol s, MOD A2C: 29.9 ml LV vol s, MOD A4C: 21.3 ml LV SV MOD A2C:     33.4 ml LV SV MOD A4C:     65.3 ml LV SV MOD BP:      41.0 ml RIGHT VENTRICLE RV Basal diam:  3.40 cm RV Mid diam:    3.80 cm RV S  prime:     10.20 cm/s TAPSE (M-mode): 2.5 cm LEFT ATRIUM             Index        RIGHT ATRIUM           Index LA diam:        4.40 cm 2.10 cm/m   RA Area:     19.20 cm LA  Vol [facility]):   71.8 ml 34.24 ml/m  RA Volume:   56.40 ml  26.89 ml/m LA Vol (A4C):   66.6 ml 31.76 ml/m LA Biplane Vol: 72.4 ml 34.52 ml/m  AORTIC VALVE                     PULMONIC VALVE AV Area (Vmax):    2.54 cm      PV Vmax:       1.10 m/s AV Area (Vmean):   2.31 cm      PV Peak grad:  4.8 mmHg AV Area (VTI):     2.43 cm AV Vmax:           158.00 cm/s AV Vmean:          113.000 cm/s AV VTI:            0.385 m AV Peak Grad:      10.0 mmHg AV Mean Grad:      6.0 mmHg LVOT Vmax:         116.00 cm/s LVOT Vmean:        75.500 cm/s LVOT VTI:          0.270 m LVOT/AV VTI ratio: 0.70  AORTA Ao Root diam: 3.40 cm MITRAL VALVE                TRICUSPID VALVE MV Area (PHT): 4.71 cm     TR Peak grad:   36.0 mmHg MV Area VTI:   2.72 cm     TR Vmax:        300.00 cm/s MV Peak grad:  4.0 mmHg MV Mean grad:  1.0 mmHg     SHUNTS MV Vmax:       1.00 m/s     Systemic VTI:  0.27 m MV Vmean:      56.9 cm/s    Systemic Diam: 2.10 cm MV Decel Time: 161 msec MV E velocity: 65.60 cm/s MV A velocity: 109.00 cm/s MV E/A ratio:  0.60 Julien Nordmann MD Electronically signed by Julien Nordmann MD Signature Date/Time: 11/19/2023/12:03:19 PM    Final                LOS: 3 days   Daneli Butkiewicz  [facility]   Pager on [email or web address]. If 7PM-7AM, please contact night-coverage at [email or web address]     11/19/2023, 1:10 PM

## 2023-11-19 NOTE — TOC Initial Note (Signed)
Transition of Care [facility]) - Initial/Assessment Note    Patient Details  Name: Dillon [name] MRN: [id number] Date of Birth: 09/24/1956  Transition of Care [facility]) CM/SW Contact:    Truddie Hidden, RN Phone Number: 11/19/2023, 9:50 AM  Clinical Narrative:                 Spoke with patient regarding therapy's recommendation and discharge plan. He is not agreeable to [facility] PT. He was advised if he feels he needs therapy post discharge to follow up with his PCP. He stated he hasa 4 WW at home already. His wife will transport him at discharge.          Patient Goals and CMS Choice            Expected Discharge Plan and Services                                              Prior Living Arrangements/Services                       Activities of Daily Living   ADL Screening (condition at time of admission) Independently performs ADLs?: Yes (appropriate for developmental age) Is the patient deaf or have difficulty hearing?: No Does the patient have difficulty seeing, even when wearing glasses/contacts?: No Does the patient have difficulty concentrating, remembering, or making decisions?: No  Permission Sought/Granted                  Emotional Assessment              Admission diagnosis:  COPD exacerbation (HCC) [J44.1] Chronic lung disease [J98.4] Acute respiratory failure with hypoxia (HCC) [J96.01] Acute hypoxic respiratory failure (HCC) [J96.01] Patient Active Problem List   Diagnosis Date Noted   COPD with acute exacerbation (HCC) 11/17/2023   Pulmonary hypertension (HCC) 11/17/2023   GERD without esophagitis 11/17/2023   Coronary artery disease 11/17/2023   Acute respiratory failure with hypoxia (HCC) 11/16/2023   Acute on chronic hypoxic respiratory failure (HCC) 10/24/2022   Difficult or painful urination 12/23/2021   Impaired fasting glucose    Non-ST elevation (NSTEMI) myocardial infarction (HCC) 09/30/2021   Chest pain 09/30/2021    Elevated glucose level 09/30/2021   Weight loss 06/12/2021   Abrasion of left arm 12/07/2020   Chronic obstructive pulmonary disease (HCC) 12/07/2020   Essential hypertension 12/07/2020   Fever in adult 09/07/2020   Dyslipidemia 09/07/2020   Cellulitis of left buttock 06/11/2020   Pulmonary fibrosis (HCC) 05/11/2020   Coronary artery disease due to lipid rich plaque 05/11/2020   Dermatitis 05/11/2020   Disorder of left rotator cuff 03/09/2020   ED (erectile dysfunction) of non-organic origin 03/09/2020   ST elevation myocardial infarction (STEMI) (HCC)    COVID-19 virus infection    Acute ST elevation myocardial infarction (STEMI) of anterior wall (HCC) 09/30/2019   Annual physical exam    Rectal polyp    Benign neoplasm of ascending colon    Internal hemorrhoids    Proctitis    Diverticulosis of large intestine without diverticulitis    PCP:  Corky Downs, MD Pharmacy:   [facility] [address] [place] - [address] [place] [address] Phone: [phone] Fax: [phone]     Social Drivers of Health (SDOH) Social History: SDOH Screenings  Food Insecurity: No Food Insecurity (11/19/2023)  Housing: Low Risk  (11/19/2023)  Transportation Needs: No Transportation Needs (11/19/2023)  Utilities: Not At Risk (11/19/2023)  Depression (PHQ2-9): Low Risk  (12/23/2021)  Social Connections: Moderately Integrated (11/19/2023)  Tobacco Use: Medium Risk (11/18/2023)   SDOH Interventions:     Readmission Risk Interventions     No data to display

## 2023-11-19 NOTE — Progress Notes (Signed)
Patient arrieved with ER Nurse Dorathy Daft RN at [phone]. Writer inquired why patient ws on droplet precautions. ER Nurse Dorathy Daft not aware patient is on droplet precautions. Patient is A&O x 4 and able to make his needs known. Patient denies any pain or discomfort at this time. Patient states he was ambulatory at home prior to ER visit. Patient transferred from high back chair to bed with SBA, steady on his feet. Patient called wife to notify her of transfer to 29.

## 2023-11-19 NOTE — Plan of Care (Signed)
  Problem: Clinical Measurements: Goal: Diagnostic test results will improve Outcome: Progressing   Problem: Activity: Goal: Risk for activity intolerance will decrease Outcome: Progressing   Problem: Coping: Goal: Level of anxiety will decrease Outcome: Progressing   Problem: Respiratory: Goal: Ability to maintain a clear airway will improve Outcome: Progressing   Problem: Respiratory: Goal: Ability to maintain a clear airway will improve Outcome: Progressing   Problem: Respiratory: Goal: Ability to maintain a clear airway will improve Outcome: Progressing   Problem: Respiratory: Goal: Levels of oxygenation will improve Outcome: Progressing

## 2023-11-19 NOTE — Progress Notes (Signed)
*  PRELIMINARY RESULTS* Echocardiogram 2D Echocardiogram has been performed.  Dillon [name] 11/19/2023, 11:02 AM

## 2023-11-19 NOTE — Progress Notes (Signed)
PULMONOLOGY         Date: 11/19/2023,   MRN# [id number] Dillon [name] [facility] Apr 13, 1956     AdmissionWeight: 80.7 kg                 CurrentWeight: 83.4 kg  Referring provider: Dr Nelson Chimes   CHIEF COMPLAINT:    Acute on chronic hypoxemic respiratory failure   HISTORY OF PRESENT ILLNESS   68 yo M with advanced COPD chronic hypoxemia pulmonary fibrosis chronic cough GERD came in with dyspnea , worsening cough and respiratory distress.  He was placed on BIPAP and required 60% fio2 to reach normal oxygen saturation.  He was given rocephin and zithromax empirically for CAP. His covid panel is negative. 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 For pulmonary fibrosis he takes pirfenidone TID.  He has ILD associated PH and takes tyvasso for this.   11/18/23- patient reports "puffy feet", he does have trace pedal edema.  His TTE was done 07/2023 without concerning findings.  He does have chronic aortic aneurysmal dilation which is something he has followed up on outpatient.  Ill order BNP today and repeat Echo since family is concerned regarding cardiac dysfunction.  His Troponin was 24 with normal renal function on this admission. He is on 8L/min and was able to ambulate to bathroom.  I reviewed case with daughter Clydie Braun and wife at bedside.   11/19/23- patient is improved to 6L/min.  Wife and daughter at bedside.  He is ambulatory around room and is less labored breathing.  His lung ausucltaion has improved.    PAST MEDICAL HISTORY   Past Medical History:  Diagnosis Date   Chronic cough    COPD (chronic obstructive pulmonary disease) (HCC)    Coronary artery disease    Edentulous    Elevated cholesterol    GERD (gastroesophageal reflux disease)    Myocardial infarction (HCC)    Pulmonary fibrosis (HCC)    Restless leg syndrome    Supplemental oxygen dependent      SURGICAL HISTORY   Past Surgical History:  Procedure Laterality Date   ANGIOPLASTY  2010   2 stents placed   BACK SURGERY      cardiac stents     x2   CATARACT EXTRACTION W/PHACO Left 08/20/2023   Procedure: CATARACT EXTRACTION PHACO AND INTRAOCULAR LENS PLACEMENT (IOC) LEFT 16.87 01:21.8;  Surgeon: Estanislado Pandy, MD;  Location: [facility];  Service: Ophthalmology;  Laterality: Left;   CATARACT EXTRACTION W/PHACO Right 09/03/2023   Procedure: CATARACT EXTRACTION PHACO AND INTRAOCULAR LENS PLACEMENT (IOC) RIGHT 19.41 01:30.0;  Surgeon: Estanislado Pandy, MD;  Location: [facility];  Service: Ophthalmology;  Laterality: Right;   COLONOSCOPY     COLONOSCOPY WITH PROPOFOL N/A 03/05/2018   Procedure: COLONOSCOPY WITH PROPOFOL;  Surgeon: Pasty Spillers, MD;  Location: [facility];  Service: Endoscopy;  Laterality: N/A;   COLONOSCOPY WITH PROPOFOL N/A 01/24/2022   Procedure: COLONOSCOPY WITH PROPOFOL;  Surgeon: Wyline Mood, MD;  Location: [facility];  Service: Gastroenterology;  Laterality: N/A;   CORONARY ANGIOPLASTY     CORONARY/GRAFT ACUTE MI REVASCULARIZATION N/A 09/30/2019   Procedure: Coronary/Graft Acute MI Revascularization;  Surgeon: Iran Ouch, MD;  Location: [facility];  Service: Cardiovascular;  Laterality: N/A;   LEFT HEART CATH AND CORONARY ANGIOGRAPHY N/A 09/30/2019   Procedure: LEFT HEART CATH AND CORONARY ANGIOGRAPHY;  Surgeon: Iran Ouch, MD;  Location: [facility];  Service: Cardiovascular;  Laterality: N/A;  LEFT HEART CATH AND CORS/GRAFTS ANGIOGRAPHY N/A 10/01/2021   Procedure: LEFT HEART CATH AND CORS/GRAFTS ANGIOGRAPHY;  Surgeon: Yvonne Kendall, MD;  Location: [facility];  Service: Cardiovascular;  Laterality: N/A;     FAMILY HISTORY   Family History  Problem Relation Age of Onset   Heart disease Mother    Heart disease Father      SOCIAL HISTORY   Social History   Tobacco Use   Smoking status: Former    Current packs/day: 0.00    Average packs/day: 2.0 packs/day for 40.0 years (80.0 ttl pk-yrs)     Types: Cigarettes    Start date: 89    Quit date: 2013    Years since quitting: 12.0   Smokeless tobacco: Never  Vaping Use   Vaping status: Never Used  Substance Use Topics   Alcohol use: Not Currently    Comment: occassionally - 1 beer/month or less   Drug use: Not Currently     MEDICATIONS    Home Medication:     Current Medication:  Current Facility-Administered Medications:    acetaminophen (TYLENOL) tablet 650 mg, 650 mg, Oral, Q6H PRN, 650 mg at 11/18/23 2135 **OR** acetaminophen (TYLENOL) suppository 650 mg, 650 mg, Rectal, Q6H PRN, Mansy, Jan A, MD   atorvastatin (LIPITOR) tablet 40 mg, 40 mg, Oral, Daily, Mansy, Jan A, MD, 40 mg at 11/19/23 0858   azithromycin (ZITHROMAX) tablet 250 mg, 250 mg, Oral, Daily, Karna Christmas, Caetano Oberhaus, MD, 250 mg at 11/19/23 0858   carvedilol (COREG) tablet 6.25 mg, 6.25 mg, Oral, BID WC, Mansy, Jan A, MD, 6.25 mg at 11/19/23 1610   chlorpheniramine-HYDROcodone (TUSSIONEX) 10-8 MG/5ML suspension 5 mL, 5 mL, Oral, Q12H PRN, Mansy, Jan A, MD   clopidogrel (PLAVIX) tablet 37.5 mg, 37.5 mg, Oral, QODAY, Mansy, Jan A, MD, 37.5 mg at 11/19/23 1244   enoxaparin (LOVENOX) injection 40 mg, 40 mg, Subcutaneous, Q24H, Mansy, Jan A, MD, 40 mg at 11/18/23 2133   fluticasone furoate-vilanterol (BREO ELLIPTA) 100-25 MCG/ACT 1 puff, 1 puff, Inhalation, Daily, 1 puff at 11/19/23 0838 **AND** [DISCONTINUED] umeclidinium bromide (INCRUSE ELLIPTA) 62.5 MCG/ACT 1 puff, 1 puff, Inhalation, Daily, Lurene Shadow, MD   guaiFENesin (MUCINEX) 12 hr tablet 600 mg, 600 mg, Oral, BID, Mansy, Jan A, MD, 600 mg at 11/19/23 9604   ipratropium-albuterol (DUONEB) 0.5-2.5 (3) MG/3ML nebulizer solution 3 mL, 3 mL, Nebulization, BID, Lurene Shadow, MD   magnesium hydroxide (MILK OF MAGNESIA) suspension 30 mL, 30 mL, Oral, Daily PRN, Mansy, Jan A, MD   methylPREDNISolone sodium succinate (SOLU-MEDROL) 40 mg/mL injection 40 mg, 40 mg, Intravenous, Q24H, Daryl Quiros, MD, 40 mg at  11/19/23 0857   nitroGLYCERIN (NITROSTAT) SL tablet 0.4 mg, 0.4 mg, Sublingual, Q5 Min x 3 PRN, Mansy, Jan A, MD   omega-3 acid ethyl esters (LOVAZA) capsule 1,000 mg, 1,000 mg, Oral, Daily, Mansy, Jan A, MD, 1,000 mg at 11/19/23 0857   ondansetron (ZOFRAN) tablet 4 mg, 4 mg, Oral, Q6H PRN **OR** ondansetron (ZOFRAN) injection 4 mg, 4 mg, Intravenous, Q6H PRN, Mansy, Jan A, MD   pantoprazole (PROTONIX) EC tablet 40 mg, 40 mg, Oral, Daily, Mansy, Jan A, MD, 40 mg at 11/19/23 5409   Pirfenidone CAPS 3 capsule, 3 capsule, Oral, TID AC, Nazari, Walid A, RPH, 3 capsule at 11/19/23 1245   [START ON 11/20/2023] roflumilast (DALIRESP) tablet 500 mcg, 500 mcg, Oral, Philipp Deputy, MD   traZODone (DESYREL) tablet 25 mg, 25 mg, Oral, QHS PRN, Mansy, Vernetta Honey, MD  Treprostinil (TYVASO) inhalation solution 18 mcg, 18 mcg, Inhalation, QID, Mansy, Jan A, MD, 18 mcg at 11/19/23 1200    ALLERGIES   Ace inhibitors and Cinnamon     REVIEW OF SYSTEMS    Review of Systems:  Gen:  Denies  fever, sweats, chills weigh loss  HEENT: Denies blurred vision, double vision, ear pain, eye pain, hearing loss, nose bleeds, sore throat Cardiac:  No dizziness, chest pain or heaviness, chest tightness,edema Resp:   reports dyspnea chronically  Gi: Denies swallowing difficulty, stomach pain, nausea or vomiting, diarrhea, constipation, bowel incontinence Gu:  Denies bladder incontinence, burning urine Ext:   Denies Joint pain, stiffness or swelling Skin: Denies  skin rash, easy bruising or bleeding or hives Endoc:  Denies polyuria, polydipsia , polyphagia or weight change Psych:   Denies depression, insomnia or hallucinations   Other:  All other systems negative   VS: BP 132/82 (BP Location: Right Arm)   Pulse 73   Temp 98.6 F (37 C)   Resp 16   Ht 6\' 2"  (1.88 m)   Wt 83.4 kg   SpO2 94%   BMI 23.61 kg/m      PHYSICAL EXAM    GENERAL:NAD, no fevers, chills, no weakness no fatigue HEAD:  Normocephalic, atraumatic.  EYES: Pupils equal, round, reactive to light. Extraocular muscles intact. No scleral icterus.  MOUTH: Moist mucosal membrane. Dentition intact. No abscess noted.  EAR, NOSE, THROAT: Clear without exudates. No external lesions.  NECK: Supple. No thyromegaly. No nodules. No JVD.  PULMONARY: decreased breath sounds with mild rhonchi worse at bases bilaterally.  CARDIOVASCULAR: S1 and S2. Regular rate and rhythm. No murmurs, rubs, or gallops. No edema. Pedal pulses 2+ bilaterally.  GASTROINTESTINAL: Soft, nontender, nondistended. No masses. Positive bowel sounds. No hepatosplenomegaly.  MUSCULOSKELETAL: No swelling, clubbing, or edema. Range of motion full in all extremities.  NEUROLOGIC: Cranial nerves II through XII are intact. No gross focal neurological deficits. Sensation intact. Reflexes intact.  SKIN: No ulceration, lesions, rashes, or cyanosis. Skin warm and dry. Turgor intact.  PSYCHIATRIC: Mood, affect within normal limits. The patient is awake, alert and oriented x 3. Insight, judgment intact.       IMAGING     ative & Impression  CLINICAL DATA:  Acute aortic syndrome suspected history of aneurysm   EXAM: CT ANGIOGRAPHY CHEST WITH CONTRAST   TECHNIQUE: Multidetector CT imaging of the chest was performed using the standard protocol during bolus administration of intravenous contrast. Multiplanar CT image reconstructions and MIPs were obtained to evaluate the vascular anatomy.   RADIATION DOSE REDUCTION: This exam was performed according to the departmental dose-optimization program which includes automated exposure control, adjustment of the mA and/or kV according to patient size and/or use of iterative reconstruction technique.   CONTRAST:  OMNIPAQUE IOHEXOL 350 MG/ML SOLN   COMPARISON:  CT 10/24/2022, 07/08/2021   FINDINGS: Cardiovascular: Non contrasted images of the chest demonstrate no acute intramural hematoma. Mild aortic  atherosclerosis. No aneurysm. Coronary vascular calcification. Normal cardiac size. No pericardial effusion. No dissection is seen   Mediastinum/Nodes: Patent trachea. No suspicious thyroid mass. Borderline mediastinal nodes mildly decreased compared to prior. Esophagus within normal limits.   Lungs/Pleura: Emphysema and fibrotic lung disease. No acute airspace disease, pleural effusion or pneumothorax. Scarring in the left upper lobe.   Upper Abdomen: No acute finding   Musculoskeletal: No acute osseous abnormality.   Review of the MIP images confirms the above findings.   IMPRESSION: 1. Negative for acute  aortic dissection or aneurysm in the chest. Follow-up dedicated imaging of the abdomen and pelvis may be performed if indicated. 2. Emphysema and fibrotic lung disease. 3. Aortic atherosclerosis.   Aortic Atherosclerosis (ICD10-I70.0) and Emphysema (ICD10-J43.9).   These results will be called to the ordering clinician or representative by the Radiologist Assistant, and communication documented in the PACS or [employer].     Electronically Signed   By: Jasmine Pang M.D.   On: 10/02/2023 19:26    ASSESSMENT/PLAN   Acute exacerbation of ILD and COPD    - continue perfenidone TID    Agree with solumedrol 80mg  daily >>weaning to 40mg  today   - duoneb nebulizer therapy   - rocephin and zithromax empirically -narrowed to po zithromax 250dailly     -CRP trend daily   -continue O2 nasal canula with weaning to spO2 goal at >88%  Advanced COPD  COPD carepath as above   - continue Daliresp   - steroids and antimicrobials as current   - incentive spirometry   Chronic pulmonary hypertension Due to ILD with pulmonary fibrosis Tyvasso QID         Thank you for allowing me to participate in the care of this patient.   Patient/Family are satisfied with care plan and all questions have been answered.    Provider disclosure: Patient with at least one  acute or chronic illness or injury that poses a threat to life or bodily function and is being managed actively during this encounter.  All of the below services have been performed independently by signing provider:  review of prior documentation from internal and or external health records.  Review of previous and current lab results.  Interview and comprehensive assessment during patient visit today. Review of current and previous chest radiographs/CT scans. Discussion of management and test interpretation with health care team and patient/family.   This document was prepared using Dragon voice recognition software and may include unintentional dictation errors.     Vida Rigger, M.D.  Division of Pulmonary & Critical Care Medicine

## 2023-11-19 NOTE — Care Management Important Message (Signed)
Important Message  Patient Details  Name: Dillon [name] MRN: [id number] Date of Birth: [birth year]   Important Message Given:  Yes - Medicare IM     Sherilyn Banker 11/19/2023, 12:35 PM

## 2023-11-20 ENCOUNTER — Other Ambulatory Visit: Payer: Self-pay | Admitting: Cardiovascular Disease

## 2023-11-20 DIAGNOSIS — J9601 Acute respiratory failure with hypoxia: Secondary | ICD-10-CM | POA: Diagnosis not present

## 2023-11-20 LAB — C-REACTIVE PROTEIN: CRP: 0.6 mg/dL (ref <1.0)

## 2023-11-20 MED ORDER — PREDNISONE 20 MG PO TABS
40.0000 mg | ORAL_TABLET | Freq: Every day | ORAL | Status: DC
  Administered 2023-11-20: 40 mg via ORAL
  Filled 2023-11-20: qty 2

## 2023-11-20 NOTE — Plan of Care (Signed)
  Problem: Health Behavior/Discharge Planning: Goal: Ability to manage health-related needs will improve Outcome: Progressing   Problem: Clinical Measurements: Goal: Ability to maintain clinical measurements within normal limits will improve Outcome: Progressing   Problem: Clinical Measurements: Goal: Will remain free from infection Outcome: Progressing   Problem: Clinical Measurements: Goal: Diagnostic test results will improve Outcome: Progressing   Problem: Clinical Measurements: Goal: Diagnostic test results will improve Outcome: Progressing   Problem: Respiratory: Goal: Ability to maintain a clear airway will improve Outcome: Progressing   Problem: Respiratory: Goal: Levels of oxygenation will improve Outcome: Progressing

## 2023-11-20 NOTE — Discharge Summary (Incomplete)
Physician Discharge Summary   Patient: Dillon [name] MRN: [id number] DOB: 05-10-1956  Admit date:     11/16/2023  Discharge date: 11/20/23  Discharge Physician: Lurene Shadow   PCP: Corky Downs, MD   Recommendations at discharge:  {Tip this will not be part of the note when signed- Example include specific recommendations for outpatient follow-up, pending tests to follow-up on. (Optional):26781}  ***  Discharge Diagnoses: Principal Problem:   Acute respiratory failure with hypoxia (HCC) Active Problems:   COPD with acute exacerbation (HCC)   Essential hypertension   Dyslipidemia   Pulmonary hypertension (HCC)   GERD without esophagitis   Coronary artery disease  Resolved Problems:   * No resolved hospital problems. [facility] Course: Taken from H&P.  Dillon [name] is a 68 y.o. Caucasian male with medical history significant for COPD, GERD, pulmonary fibrosis, restless leg syndrome and coronary artery disease, who presented to the emergency room with acute onset of dyspnea, cough with inability to expectorate over the last 4 to 5 days with mild chills without reported fever.  Did not any chest pain or palpitations.   On presentation hemodynamically stable, labs with potassium of 3.  Respiratory panel negative for COVID, influenza and RSV. EKG with NSR and left axis deviation.  CXR with no acute cardiopulmonary disease. Patient was initially started on BiPAP.  Admitted for concern of COPD exacerbation.  1/28: Vital stable, currently on 8 L of oxygen.  Hypokalemia resolved.  Checking respiratory viral panel to find out the cause of current exacerbation. Procalcitonin came back negative so stopping ceftriaxone. Adding Solu-Medrol and Zithromax for concern of COPD exacerbation. Pulmonary was also consulted.    Assessment and Plan: * Acute respiratory failure with hypoxia (HCC) Initially required BiPAP, currently on 8 L of oxygen.  Uses 4 L at baseline.  Concern of COPD  exacerbation, history of pulmonary hypertension and pulmonary fibrosis. Pulmonary was also consulted. -Continue supplemental oxygen-wean to baseline as tolerated  COPD with acute exacerbation (HCC) Patient with history of advanced COPD, pulmonary hypertension and pulmonary fibrosis.  Continue to have significant wheeze. Procalcitonin negative. -Added Solu-Medrol and Zithromax. -Continue with bronchodilators and supportive care -Pulmonary was also consulted   Essential hypertension -Continue home Coreg  Dyslipidemia - We will continue statin therapy.  Coronary artery disease - We will continue statin therapy, beta-blocker therapy and as needed sublingual nitroglycerin as well as and has Plavix  GERD without esophagitis - We will continue PPI therapy.  Pulmonary hypertension (HCC) - We will continue Roflumilast  and Tyvaso.      {Tip this will not be part of the note when signed Body mass index is 23.61 kg/m. , ,  (Optional):26781}  {(NOTE) Pain control PDMP Statment (Optional):26782} Consultants: *** Procedures performed: ***  Disposition: {Plan; Disposition:26390} Diet recommendation:  Discharge Diet Orders (From admission, onward)     Start     Ordered   11/20/23 0000  Diet - low sodium heart healthy        11/20/23 1008           {Diet_Plan:26776} DISCHARGE MEDICATION: Allergies as of 11/20/2023       Reactions   Ace Inhibitors Swelling   Causes cough and swelling   Cinnamon Rash   Mouth rash        Medication List     STOP taking these medications    triamcinolone ointment 0.1 % Commonly known as: KENALOG       TAKE these medications  acetaminophen 500 MG tablet Commonly known as: TYLENOL Take 1,000 mg by mouth every 8 (eight) hours as needed for moderate pain.   albuterol (2.5 MG/3ML) 0.083% nebulizer solution Commonly known as: PROVENTIL SMARTSIG:3 Milliliter(s) Via Nebulizer Every 6 Hours PRN   ALPHA LIPOIC ACID PO Take  500-1,000 mg by mouth daily.   atorvastatin 40 MG tablet Commonly known as: LIPITOR TAKE 1 TABLET BY MOUTH EVERY DAY   carvedilol 6.25 MG tablet Commonly known as: COREG Take 1 tablet (6.25 mg total) by mouth 2 (two) times daily with a meal.   clopidogrel 75 MG tablet Commonly known as: PLAVIX TAKE 1/2 TABLET (37.5 MG TOTAL) BY MOUTH EVERY OTHER DAY.   Esbriet 267 MG Caps Generic drug: Pirfenidone Take 3 capsules by mouth 3 (three) times daily.   Fluticasone-Umeclidin-Vilant 100-62.5-25 MCG/ACT Aepb Commonly known as: Trelegy Ellipta Inhale 1 puff into the lungs daily.   nitroGLYCERIN 0.4 MG SL tablet Commonly known as: NITROSTAT Place 1 tablet (0.4 mg total) under the tongue every 5 (five) minutes x 3 doses as needed for chest pain.   OXYGEN Inhale 4 L into the lungs continuous.   pantoprazole 40 MG tablet Commonly known as: PROTONIX Take 40 mg by mouth daily.   predniSONE 20 MG tablet Commonly known as: DELTASONE Take 20 mg by mouth daily.   roflumilast 500 MCG Tabs tablet Commonly known as: DALIRESP Take 1 tablet by mouth daily. Every other day   Tyvaso Starter Kit 0.6 MG/ML Soln Generic drug: Treprostinil Inhale 18 mcg into the lungs 4 (four) times daily.        Discharge Exam: Ceasar Mons Weights   11/18/23 1914 11/19/23 0037  Weight: 80.7 kg 83.4 kg   ***  Condition at discharge: {DC Condition:26389}  The results of significant diagnostics from this hospitalization (including imaging, microbiology, ancillary and laboratory) are listed below for reference.   Imaging Studies: ECHOCARDIOGRAM COMPLETE BUBBLE STUDY Result Date: 11/19/2023    ECHOCARDIOGRAM REPORT   Patient Name:   Dillon [name] Date of Exam: 11/19/2023 Medical Rec #:  [id number]     Height:       74.0 in Accession #:    [id number]    Weight:       183.9 lb Date of Birth:  07-01-1956    BSA:          2.097 m Patient Age:    68 years      BP:           112/69 mmHg Patient Gender: M              HR:           70 bpm. Exam Location:  [facility] Procedure: 2D Echo, Cardiac Doppler and Color Doppler Indications:     Interstitial edema  History:         Patient has prior history of Echocardiogram examinations, most                  recent 07/30/2023. Previous Myocardial Infarction and CAD,                  Pulmonary HTN and COPD, Signs/Symptoms:Chest Pain and Shortness                  of Breath; Risk Factors:Hypertension and Dyslipidemia.  Sonographer:     Mikki Harbor Referring Phys:  [id number] HYQM ALESKEROV Diagnosing Phys: Julien Nordmann MD IMPRESSIONS  1. Left ventricular ejection fraction, by estimation, is 60 to  65%. The left ventricle has normal function. The left ventricle has no regional wall motion abnormalities. Left ventricular diastolic parameters are consistent with Grade I diastolic dysfunction (impaired relaxation).  2. Right ventricular systolic function is normal. The right ventricular size is normal. There is mildly elevated pulmonary artery systolic pressure. The estimated right ventricular systolic pressure is 39.0 mmHg.  3. Left atrial size was mildly dilated.  4. The mitral valve is normal in structure. No evidence of mitral valve regurgitation. No evidence of mitral stenosis.  5. The aortic valve is normal in structure. Aortic valve regurgitation is not visualized. No aortic stenosis is present.  6. The inferior vena cava is normal in size with greater than 50% respiratory variability, suggesting right atrial pressure of 3 mmHg. FINDINGS  Left Ventricle: Left ventricular ejection fraction, by estimation, is 60 to 65%. The left ventricle has normal function. The left ventricle has no regional wall motion abnormalities. The left ventricular internal cavity size was normal in size. There is  no left ventricular hypertrophy. Left ventricular diastolic parameters are consistent with Grade I diastolic dysfunction (impaired relaxation). Right Ventricle: The right ventricular size is normal. No  increase in right ventricular wall thickness. Right ventricular systolic function is normal. There is mildly elevated pulmonary artery systolic pressure. The tricuspid regurgitant velocity is 3.00  m/s, and with an assumed right atrial pressure of 3 mmHg, the estimated right ventricular systolic pressure is 39.0 mmHg. Left Atrium: Left atrial size was mildly dilated. Right Atrium: Right atrial size was normal in size. Pericardium: There is no evidence of pericardial effusion. Mitral Valve: The mitral valve is normal in structure. No evidence of mitral valve regurgitation. No evidence of mitral valve stenosis. MV peak gradient, 4.0 mmHg. The mean mitral valve gradient is 1.0 mmHg. Tricuspid Valve: The tricuspid valve is normal in structure. Tricuspid valve regurgitation is mild . No evidence of tricuspid stenosis. Aortic Valve: The aortic valve is normal in structure. Aortic valve regurgitation is not visualized. No aortic stenosis is present. Aortic valve mean gradient measures 6.0 mmHg. Aortic valve peak gradient measures 10.0 mmHg. Aortic valve area, by VTI measures 2.43 cm. Pulmonic Valve: The pulmonic valve was normal in structure. Pulmonic valve regurgitation is not visualized. No evidence of pulmonic stenosis. Aorta: The aortic root is normal in size and structure. Venous: The inferior vena cava is normal in size with greater than 50% respiratory variability, suggesting right atrial pressure of 3 mmHg. IAS/Shunts: No atrial level shunt detected by color flow Doppler.  LEFT VENTRICLE PLAX 2D LVIDd:         5.20 cm     Diastology LVIDs:         3.80 cm     LV e' medial:    5.87 cm/s LV PW:         1.10 cm     LV E/e' medial:  11.2 LV IVS:        1.10 cm     LV e' lateral:   9.46 cm/s LVOT diam:     2.10 cm     LV E/e' lateral: 6.9 LV SV:         94 LV SV Index:   45 LVOT Area:     3.46 cm  LV Volumes (MOD) LV vol d, MOD A2C: 63.3 ml LV vol d, MOD A4C: 65.3 ml LV vol s, MOD A2C: 29.9 ml LV vol s, MOD A4C: 21.3  ml LV SV MOD A2C:     33.4  ml LV SV MOD A4C:     65.3 ml LV SV MOD BP:      41.0 ml RIGHT VENTRICLE RV Basal diam:  3.40 cm RV Mid diam:    3.80 cm RV S prime:     10.20 cm/s TAPSE (M-mode): 2.5 cm LEFT ATRIUM             Index        RIGHT ATRIUM           Index LA diam:        4.40 cm 2.10 cm/m   RA Area:     19.20 cm LA Vol (A2C):   71.8 ml 34.24 ml/m  RA Volume:   56.40 ml  26.89 ml/m LA Vol (A4C):   66.6 ml 31.76 ml/m LA Biplane Vol: 72.4 ml 34.52 ml/m  AORTIC VALVE                     PULMONIC VALVE AV Area (Vmax):    2.54 cm      PV Vmax:       1.10 m/s AV Area (Vmean):   2.31 cm      PV Peak grad:  4.8 mmHg AV Area (VTI):     2.43 cm AV Vmax:           158.00 cm/s AV Vmean:          113.000 cm/s AV VTI:            0.385 m AV Peak Grad:      10.0 mmHg AV Mean Grad:      6.0 mmHg LVOT Vmax:         116.00 cm/s LVOT Vmean:        75.500 cm/s LVOT VTI:          0.270 m LVOT/AV VTI ratio: 0.70  AORTA Ao Root diam: 3.40 cm MITRAL VALVE                TRICUSPID VALVE MV Area (PHT): 4.71 cm     TR Peak grad:   36.0 mmHg MV Area VTI:   2.72 cm     TR Vmax:        300.00 cm/s MV Peak grad:  4.0 mmHg MV Mean grad:  1.0 mmHg     SHUNTS MV Vmax:       1.00 m/s     Systemic VTI:  0.27 m MV Vmean:      56.9 cm/s    Systemic Diam: 2.10 cm MV Decel Time: 161 msec MV E velocity: 65.60 cm/s MV A velocity: 109.00 cm/s MV E/A ratio:  0.60 Julien Nordmann MD Electronically signed by Julien Nordmann MD Signature Date/Time: 11/19/2023/12:03:19 PM    Final    DG Chest Portable 1 View Result Date: 11/16/2023 CLINICAL DATA:  Shortness of breath, COPD exacerbation, evaluate for pneumonia with new hypoxia from baseline EXAM: PORTABLE CHEST 1 VIEW COMPARISON:  Chest x-ray 10/24/2022 FINDINGS: The heart and mediastinal contours are unchanged. No focal consolidation. Chronic coarsened interstitial markings with no overt pulmonary edema. No pleural effusion. No pneumothorax. No acute osseous abnormality. IMPRESSION: No active  disease in a patient with chronic interstitial lung disease. Electronically Signed   By: Tish Frederickson M.D.   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 Other Factors: Intemittent epigastric pain.  Comparison Study: No previous aorta duplex exam on record Performing Technologist: Quentin Ore RDMS, RVT, RDCS  Examination Guidelines: A complete evaluation includes B-mode imaging, spectral Doppler, color Doppler, and power Doppler as needed of all accessible portions of each vessel. Bilateral testing is considered an integral part of a complete examination. Limited examinations for reoccurring indications may be performed as noted.  Abdominal Aorta Findings: +-------------+-------+----------+----------+---------+--------+--------+ Location     AP (cm)Trans (cm)PSV (cm/s)Waveform ThrombusComments +-------------+-------+----------+----------+---------+--------+--------+ Mid          3.00   3.20      95                 Present fusiform +-------------+-------+----------+----------+---------+--------+--------+ Distal       1.50   1.60      94                                  +-------------+-------+----------+----------+---------+--------+--------+ RT CIA Prox  1.2    1.0       137       triphasic                 +-------------+-------+----------+----------+---------+--------+--------+ RT CIA Mid                    146        triphasic                 +-------------+-------+----------+----------+---------+--------+--------+ RT CIA Distal                 74        triphasic                 +-------------+-------+----------+----------+---------+--------+--------+ RT EIA Prox                   100       triphasic                 +-------------+-------+----------+----------+---------+--------+--------+ RT EIA Mid   0.9    0.9                                           +-------------+-------+----------+----------+---------+--------+--------+ RT EIA Distal                 93        triphasic                 +-------------+-------+----------+----------+---------+--------+--------+ LT CIA Prox  1.4    1.3       75        triphasic                 +-------------+-------+----------+----------+---------+--------+--------+ LT CIA Mid                    79  triphasic                 +-------------+-------+----------+----------+---------+--------+--------+ LT CIA Distal                 87        triphasic                 +-------------+-------+----------+----------+---------+--------+--------+ LT EIA Prox                   77        triphasic                 +-------------+-------+----------+----------+---------+--------+--------+ LT EIA Mid   1.4    1.6                                           +-------------+-------+----------+----------+---------+--------+--------+ LT EIA Distal                 96        biphasic                  +-------------+-------+----------+----------+---------+--------+--------+ Visualization of the Proximal Abdominal Aorta and Superceliac artery was limited. The entire proximal segment of the aorta was obscured by bowel gas. IVC/Iliac Findings: +-------+------+--------+--------+   IVC  PatentThrombusComments +-------+------+--------+--------+ IVC Midpatent                 +-------+------+--------+--------+    Summary:  Abdominal Aorta: There is evidence of abnormal dilatation of the mid Abdominal aorta. The largest aortic measurement is 3.2 cm. No previous exam available for comparison.  *See table(s) above for measurements and observations. Suggest follow up study in 12 months.  Electronically signed by Lorine Bears MD on 11/06/2023 at 1:36:26 PM.    Final     Microbiology: Results for orders placed or performed during the hospital encounter of 11/16/23  Resp panel by RT-PCR (RSV, Flu A&B, Covid) Anterior Nasal Swab     Status: None   Collection Time: 11/16/23  9:03 PM   Specimen: Anterior Nasal Swab  Result Value Ref Range Status   SARS Coronavirus 2 by RT PCR NEGATIVE NEGATIVE Final    Comment: (NOTE) SARS-CoV-2 target nucleic acids are NOT DETECTED.  The SARS-CoV-2 RNA is generally detectable in upper respiratory specimens during the acute phase of infection. The lowest concentration of SARS-CoV-2 viral copies this assay can detect is 138 copies/mL. A negative result does not preclude SARS-Cov-2 infection and should not be used as the sole basis for treatment or other patient management decisions. A negative result may occur with  improper specimen collection/handling, submission of specimen other than nasopharyngeal swab, presence of viral mutation(s) within the areas targeted by this assay, and inadequate number of viral copies(<138 copies/mL). A negative result must be combined with clinical observations, patient history, and epidemiological information. The expected result is Negative.  Fact Sheet for Patients:  BloggerCourse.com  Fact Sheet for Healthcare Providers:  SeriousBroker.it  This test is no t yet approved or cleared by the [place] FDA and  has been authorized for detection and/or diagnosis of SARS-CoV-2 by FDA under an Emergency Use Authorization (EUA). This EUA will remain  in effect (meaning this test can be used) for  the duration of the COVID-19 declaration under Section 564(b)(1) of the Act, 21 U.S.C.section 360bbb-3(b)(1), unless the authorization is terminated  or revoked sooner.  Influenza A by PCR NEGATIVE NEGATIVE Final   Influenza B by PCR NEGATIVE NEGATIVE Final    Comment: (NOTE) The Xpert Xpress SARS-CoV-2/FLU/RSV plus assay is intended as an aid in the diagnosis of influenza from Nasopharyngeal swab specimens and should not be used as a sole basis for treatment. Nasal washings and aspirates are unacceptable for Xpert Xpress SARS-CoV-2/FLU/RSV testing.  Fact Sheet for Patients: BloggerCourse.com  Fact Sheet for Healthcare Providers: SeriousBroker.it  This test is not yet approved or cleared by the [place] FDA and has been authorized for detection and/or diagnosis of SARS-CoV-2 by FDA under an Emergency Use Authorization (EUA). This EUA will remain in effect (meaning this test can be used) for the duration of the COVID-19 declaration under Section 564(b)(1) of the Act, 21 U.S.C. section 360bbb-3(b)(1), unless the authorization is terminated or revoked.     Resp Syncytial Virus by PCR NEGATIVE NEGATIVE Final    Comment: (NOTE) Fact Sheet for Patients: BloggerCourse.com  Fact Sheet for Healthcare Providers: SeriousBroker.it  This test is not yet approved or cleared by the [place] FDA and has been authorized for detection and/or diagnosis of SARS-CoV-2 by FDA under an Emergency Use Authorization (EUA). This EUA will remain in effect (meaning this test can be used) for the duration of the COVID-19 declaration under Section 564(b)(1) of the Act, 21 U.S.C. section 360bbb-3(b)(1), unless the authorization is terminated or revoked.  Performed at [facility], [address] [place], [place] [address]   Respiratory (~20 pathogens) panel by PCR     Status:  None   Collection Time: 11/17/23  9:43 AM   Specimen: Nasopharyngeal Swab; Respiratory  Result Value Ref Range Status   Adenovirus NOT DETECTED NOT DETECTED Final   Coronavirus 229E NOT DETECTED NOT DETECTED Final    Comment: (NOTE) The Coronavirus on the Respiratory Panel, DOES NOT test for the novel  Coronavirus (2019 nCoV)    Coronavirus HKU1 NOT DETECTED NOT DETECTED Final   Coronavirus NL63 NOT DETECTED NOT DETECTED Final   Coronavirus OC43 NOT DETECTED NOT DETECTED Final   Metapneumovirus NOT DETECTED NOT DETECTED Final   Rhinovirus / Enterovirus NOT DETECTED NOT DETECTED Final   Influenza A NOT DETECTED NOT DETECTED Final   Influenza B NOT DETECTED NOT DETECTED Final   Parainfluenza Virus 1 NOT DETECTED NOT DETECTED Final   Parainfluenza Virus 2 NOT DETECTED NOT DETECTED Final   Parainfluenza Virus 3 NOT DETECTED NOT DETECTED Final   Parainfluenza Virus 4 NOT DETECTED NOT DETECTED Final   Respiratory Syncytial Virus NOT DETECTED NOT DETECTED Final   Bordetella pertussis NOT DETECTED NOT DETECTED Final   Bordetella Parapertussis NOT DETECTED NOT DETECTED Final   Chlamydophila pneumoniae NOT DETECTED NOT DETECTED Final   Mycoplasma pneumoniae NOT DETECTED NOT DETECTED Final    Comment: Performed at [facility], [address], [place], [place] [address]    Labs: CBC: Recent Labs  Lab 11/16/23 2103 11/17/23 0519  WBC 8.0 5.1  NEUTROABS 5.1  --   HGB 14.2 13.2  HCT 41.7 39.3  MCV 101.7* 103.7*  PLT 135* 122*   Basic Metabolic Panel: Recent Labs  Lab 11/16/23 2103 11/17/23 0519  NA 143 141  K 3.0* 3.8  CL 108 109  CO2 25 25  GLUCOSE 109* 162*  BUN 12 13  CREATININE 0.78 0.75  CALCIUM 8.4* 8.3*   Liver Function Tests: Recent Labs  Lab 11/16/23 2103  AST 20  ALT 31  ALKPHOS 60  BILITOT  0.9  PROT 6.6  ALBUMIN 3.4*   CBG: No results for input(s): "GLUCAP" in the last 168 hours.  Discharge time spent: {LESS THAN/GREATER ZOXW:96045} 30  minutes.  Signed: Lurene Shadow, MD [facility] 11/20/2023

## 2023-11-20 NOTE — Progress Notes (Signed)
   11/20/23 0940  Spiritual Encounters  Type of Visit Initial  Care provided to: Pt and family  Referral source Chaplain assessment  Reason for visit Routine spiritual support  OnCall Visit No  Spiritual Framework  Presenting Themes Meaning/purpose/sources of inspiration  Interventions  Spiritual Care Interventions Made Compassionate presence  Intervention Outcomes  Outcomes Connection to spiritual care

## 2023-11-20 NOTE — Progress Notes (Signed)
PULMONOLOGY         Date: 11/20/2023,   MRN# [id number] Dillon [name] [facility] 09/07/1956     AdmissionWeight: 80.7 kg                 CurrentWeight: 83.4 kg  Referring provider: Dr Nelson Chimes   CHIEF COMPLAINT:    Acute on chronic hypoxemic respiratory failure   HISTORY OF PRESENT ILLNESS   68 yo M with advanced COPD chronic hypoxemia pulmonary fibrosis chronic cough GERD came in with dyspnea , worsening cough and respiratory distress.  He was placed on BIPAP and required 60% fio2 to reach normal oxygen saturation.  He was given rocephin and zithromax empirically for CAP. His covid panel is negative. 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 For pulmonary fibrosis he takes pirfenidone TID.  He has ILD associated PH and takes tyvasso for this.   11/18/23- patient reports "puffy feet", he does have trace pedal edema.  His TTE was done 07/2023 without concerning findings.  He does have chronic aortic aneurysmal dilation which is something he has followed up on outpatient.  Ill order BNP today and repeat Echo since family is concerned regarding cardiac dysfunction.  His Troponin was 24 with normal renal function on this admission. He is on 8L/min and was able to ambulate to bathroom.  I reviewed case with daughter Clydie Braun and wife at bedside.   11/19/23- patient is improved to 6L/min.  Wife and daughter at bedside.  He is ambulatory around room and is less labored breathing.  His lung ausucltaion has improved.  11/20/23- patient is cleared for dc home with follow up on outpatient with [facility] pulm  PAST MEDICAL HISTORY   Past Medical History:  Diagnosis Date   Chronic cough    COPD (chronic obstructive pulmonary disease) (HCC)    Coronary artery disease    Edentulous    Elevated cholesterol    GERD (gastroesophageal reflux disease)    Myocardial infarction (HCC)    Pulmonary fibrosis (HCC)    Restless leg syndrome    Supplemental oxygen dependent      SURGICAL HISTORY   Past Surgical History:  Procedure  Laterality Date   ANGIOPLASTY  2010   2 stents placed   BACK SURGERY     cardiac stents     x2   CATARACT EXTRACTION W/PHACO Left 08/20/2023   Procedure: CATARACT EXTRACTION PHACO AND INTRAOCULAR LENS PLACEMENT (IOC) LEFT 16.87 01:21.8;  Surgeon: Estanislado Pandy, MD;  Location: [facility];  Service: Ophthalmology;  Laterality: Left;   CATARACT EXTRACTION W/PHACO Right 09/03/2023   Procedure: CATARACT EXTRACTION PHACO AND INTRAOCULAR LENS PLACEMENT (IOC) RIGHT 19.41 01:30.0;  Surgeon: Estanislado Pandy, MD;  Location: [facility];  Service: Ophthalmology;  Laterality: Right;   COLONOSCOPY     COLONOSCOPY WITH PROPOFOL N/A 03/05/2018   Procedure: COLONOSCOPY WITH PROPOFOL;  Surgeon: Pasty Spillers, MD;  Location: [facility] ENDOSCOPY;  Service: Endoscopy;  Laterality: N/A;   COLONOSCOPY WITH PROPOFOL N/A 01/24/2022   Procedure: COLONOSCOPY WITH PROPOFOL;  Surgeon: Wyline Mood, MD;  Location: [facility] ENDOSCOPY;  Service: Gastroenterology;  Laterality: N/A;   CORONARY ANGIOPLASTY     CORONARY/GRAFT ACUTE MI REVASCULARIZATION N/A 09/30/2019   Procedure: Coronary/Graft Acute MI Revascularization;  Surgeon: Iran Ouch, MD;  Location: [facility] INVASIVE CV LAB;  Service: Cardiovascular;  Laterality: N/A;   LEFT HEART CATH AND CORONARY ANGIOGRAPHY N/A 09/30/2019   Procedure: LEFT HEART CATH AND CORONARY ANGIOGRAPHY;  Surgeon: Iran Ouch, MD;  Location: [facility] INVAS[facility] CV LAB;  Service: Cardiovascular;  Laterality: N/A;   LEFT HEART CATH AND CORS/GRAFTS ANGIOGRAPHY N/A 10/01/2021   Procedure: LEFT HEART CATH AND CORS/GRAFTS ANGIOGRAPHY;  Surgeon: Yvonne Kendall, MD;  Location: [facility] INVASIVE CV LAB;  Service: Cardiovascular;  Laterality: N/A;     FAMILY HISTORY   Family History  Problem Relation Age of Onset   Heart disease Mother    Heart disease Father      SOCIAL HISTORY   Social History   Tobacco Use   Smoking status: Former    Current packs/day: 0.00     Average packs/day: 2.0 packs/day for 40.0 years (80.0 ttl pk-yrs)    Types: Cigarettes    Start date: 70    Quit date: 2013    Years since quitting: 12.0   Smokeless tobacco: Never  Vaping Use   Vaping status: Never Used  Substance Use Topics   Alcohol use: Not Currently    Comment: occassionally - 1 beer/month or less   Drug use: Not Currently     MEDICATIONS    Home Medication:     Current Medication:  Current Facility-Administered Medications:    acetaminophen (TYLENOL) tablet 650 mg, 650 mg, Oral, Q6H PRN, 650 mg at 11/19/23 2133 **OR** acetaminophen (TYLENOL) suppository 650 mg, 650 mg, Rectal, Q6H PRN, Mansy, Jan A, MD   atorvastatin (LIPITOR) tablet 40 mg, 40 mg, Oral, Daily, Mansy, Jan A, MD, 40 mg at 11/19/23 0858   azithromycin (ZITHROMAX) tablet 250 mg, 250 mg, Oral, Daily, Karna Christmas, Sarkis Rhines, MD, 250 mg at 11/19/23 0858   carvedilol (COREG) tablet 6.25 mg, 6.25 mg, Oral, BID WC, Mansy, Jan A, MD, 6.25 mg at 11/19/23 1725   chlorpheniramine-HYDROcodone (TUSSIONEX) 10-8 MG/5ML suspension 5 mL, 5 mL, Oral, Q12H PRN, Mansy, Jan A, MD   clopidogrel (PLAVIX) tablet 37.5 mg, 37.5 mg, Oral, QODAY, Mansy, Jan A, MD, 37.5 mg at 11/19/23 1244   enoxaparin (LOVENOX) injection 40 mg, 40 mg, Subcutaneous, Q24H, Mansy, Jan A, MD, 40 mg at 11/19/23 2340   fluticasone furoate-vilanterol (BREO ELLIPTA) 100-25 MCG/ACT 1 puff, 1 puff, Inhalation, Daily, 1 puff at 11/19/23 0838 **AND** [DISCONTINUED] umeclidinium bromide (INCRUSE ELLIPTA) 62.5 MCG/ACT 1 puff, 1 puff, Inhalation, Daily, Lurene Shadow, MD   guaiFENesin (MUCINEX) 12 hr tablet 600 mg, 600 mg, Oral, BID, Mansy, Jan A, MD, 600 mg at 11/19/23 2133   ipratropium-albuterol (DUONEB) 0.5-2.5 (3) MG/3ML nebulizer solution 3 mL, 3 mL, Nebulization, BID, Lurene Shadow, MD, 3 mL at 11/20/23 0840   magnesium hydroxide (MILK OF MAGNESIA) suspension 30 mL, 30 mL, Oral, Daily PRN, Mansy, Jan A, MD   nitroGLYCERIN (NITROSTAT) SL tablet  0.4 mg, 0.4 mg, Sublingual, Q5 Min x 3 PRN, Mansy, Jan A, MD   omega-3 acid ethyl esters (LOVAZA) capsule 1,000 mg, 1,000 mg, Oral, Daily, Mansy, Jan A, MD, 1,000 mg at 11/19/23 0857   ondansetron (ZOFRAN) tablet 4 mg, 4 mg, Oral, Q6H PRN **OR** ondansetron (ZOFRAN) injection 4 mg, 4 mg, Intravenous, Q6H PRN, Mansy, Jan A, MD   pantoprazole (PROTONIX) EC tablet 40 mg, 40 mg, Oral, Daily, Mansy, Jan A, MD, 40 mg at 11/19/23 9811   Pirfenidone CAPS 3 capsule, 3 capsule, Oral, TID AC, Nazari, Walid A, RPH, 3 capsule at 11/19/23 1725   predniSONE (DELTASONE) tablet 40 mg, 40 mg, Oral, Q breakfast, Lurene Shadow, MD   roflumilast (DALIRESP) tablet 500 mcg, 500 mcg, Oral, Philipp Deputy, MD   traZODone (DESYREL) tablet 25 mg, 25 mg, Oral,  QHS PRN, Mansy, Jan A, MD   Treprostinil (TYVASO) inhalation solution 18 mcg, 18 mcg, Inhalation, QID, Mansy, Jan A, MD, 18 mcg at 11/20/23 1610    ALLERGIES   Ace inhibitors and Cinnamon     REVIEW OF SYSTEMS    Review of Systems:  Gen:  Denies  fever, sweats, chills weigh loss  HEENT: Denies blurred vision, double vision, ear pain, eye pain, hearing loss, nose bleeds, sore throat Cardiac:  No dizziness, chest pain or heaviness, chest tightness,edema Resp:   reports dyspnea chronically  Gi: Denies swallowing difficulty, stomach pain, nausea or vomiting, diarrhea, constipation, bowel incontinence Gu:  Denies bladder incontinence, burning urine Ext:   Denies Joint pain, stiffness or swelling Skin: Denies  skin rash, easy bruising or bleeding or hives Endoc:  Denies polyuria, polydipsia , polyphagia or weight change Psych:   Denies depression, insomnia or hallucinations   Other:  All other systems negative   VS: BP (!) 143/77 (BP Location: Right Arm)   Pulse 66   Temp 97.7 F (36.5 C) (Oral)   Resp 18   Ht 6\' 2"  (1.88 m)   Wt 83.4 kg   SpO2 90%   BMI 23.61 kg/m      PHYSICAL EXAM    GENERAL:NAD, no fevers, chills, no weakness no  fatigue HEAD: Normocephalic, atraumatic.  EYES: Pupils equal, round, reactive to light. Extraocular muscles intact. No scleral icterus.  MOUTH: Moist mucosal membrane. Dentition intact. No abscess noted.  EAR, NOSE, THROAT: Clear without exudates. No external lesions.  NECK: Supple. No thyromegaly. No nodules. No JVD.  PULMONARY: decreased breath sounds with mild rhonchi worse at bases bilaterally.  CARDIOVASCULAR: S1 and S2. Regular rate and rhythm. No murmurs, rubs, or gallops. No edema. Pedal pulses 2+ bilaterally.  GASTROINTESTINAL: Soft, nontender, nondistended. No masses. Positive bowel sounds. No hepatosplenomegaly.  MUSCULOSKELETAL: No swelling, clubbing, or edema. Range of motion full in all extremities.  NEUROLOGIC: Cranial nerves II through XII are intact. No gross focal neurological deficits. Sensation intact. Reflexes intact.  SKIN: No ulceration, lesions, rashes, or cyanosis. Skin warm and dry. Turgor intact.  PSYCHIATRIC: Mood, affect within normal limits. The patient is awake, alert and oriented x 3. Insight, judgment intact.       IMAGING     ative & Impression  CLINICAL DATA:  Acute aortic syndrome suspected history of aneurysm   EXAM: CT ANGIOGRAPHY CHEST WITH CONTRAST   TECHNIQUE: Multidetector CT imaging of the chest was performed using the standard protocol during bolus administration of intravenous contrast. Multiplanar CT image reconstructions and MIPs were obtained to evaluate the vascular anatomy.   RADIATION DOSE REDUCTION: This exam was performed according to the departmental dose-optimization program which includes automated exposure control, adjustment of the mA and/or kV according to patient size and/or use of iterative reconstruction technique.   CONTRAST:  OMNIPAQUE IOHEXOL 350 MG/ML SOLN   COMPARISON:  CT 10/24/2022, 07/08/2021   FINDINGS: Cardiovascular: Non contrasted images of the chest demonstrate no acute intramural hematoma.  Mild aortic atherosclerosis. No aneurysm. Coronary vascular calcification. Normal cardiac size. No pericardial effusion. No dissection is seen   Mediastinum/Nodes: Patent trachea. No suspicious thyroid mass. Borderline mediastinal nodes mildly decreased compared to prior. Esophagus within normal limits.   Lungs/Pleura: Emphysema and fibrotic lung disease. No acute airspace disease, pleural effusion or pneumothorax. Scarring in the left upper lobe.   Upper Abdomen: No acute finding   Musculoskeletal: No acute osseous abnormality.   Review of the MIP images confirms  the above findings.   IMPRESSION: 1. Negative for acute aortic dissection or aneurysm in the chest. Follow-up dedicated imaging of the abdomen and pelvis may be performed if indicated. 2. Emphysema and fibrotic lung disease. 3. Aortic atherosclerosis.   Aortic Atherosclerosis (ICD10-I70.0) and Emphysema (ICD10-J43.9).   These results will be called to the ordering clinician or representative by the Radiologist Assistant, and communication documented in the PACS or [employer].     Electronically Signed   By: Jasmine Pang M.D.   On: 10/02/2023 19:26    ASSESSMENT/PLAN   Acute exacerbation of ILD and COPD    - continue perfenidone TID    Agree with solumedrol 80mg  daily >>weaning to 40mg  today   - duoneb nebulizer therapy   - rocephin and zithromax empirically -narrowed to po zithromax 250dailly     -CRP trend daily   -continue O2 nasal canula with weaning to spO2 goal at >88%  Advanced COPD  COPD carepath as above   - continue Daliresp   - steroids and antimicrobials as current   - incentive spirometry   Chronic pulmonary hypertension Due to ILD with pulmonary fibrosis Tyvasso QID         Thank you for allowing me to participate in the care of this patient.   Patient/Family are satisfied with care plan and all questions have been answered.    Provider disclosure: Patient with  at least one acute or chronic illness or injury that poses a threat to life or bodily function and is being managed actively during this encounter.  All of the below services have been performed independently by signing provider:  review of prior documentation from internal and or external health records.  Review of previous and current lab results.  Interview and comprehensive assessment during patient visit today. Review of current and previous chest radiographs/CT scans. Discussion of management and test interpretation with health care team and patient/family.   This document was prepared using Dragon voice recognition software and may include unintentional dictation errors.     Vida Rigger, M.D.  Division of Pulmonary & Critical Care Medicine

## 2023-11-20 NOTE — TOC Transition Note (Signed)
Transition of Care [facility]) - Discharge Note   Patient Details  Name: Jeffory Snelgrove MRN: [id number] Date of Birth: 06/03/1956  Transition of Care [facility]) CM/SW Contact:  Margarito Liner, LCSW Phone Number: 11/20/2023, 11:02 AM   Clinical Narrative:  Patient has orders to discharge home today. RN confirmed he is back to his baseline 4 L of oxygen. No further concerns. CSW signing off.   Final next level of care: Home/Self Care Barriers to Discharge: Barriers Resolved   Patient Goals and CMS Choice            Discharge Placement                Patient to be transferred to facility by: Wife   Patient and family notified of of transfer: 11/20/23  Discharge Plan and Services Additional resources added to the After Visit Summary for                                       Social Drivers of Health (SDOH) Interventions SDOH Screenings   Food Insecurity: No Food Insecurity (11/19/2023)  Housing: Low Risk  (11/19/2023)  Transportation Needs: No Transportation Needs (11/19/2023)  Utilities: Not At Risk (11/19/2023)  Depression (PHQ2-9): Low Risk  (12/23/2021)  Social Connections: Moderately Integrated (11/19/2023)  Tobacco Use: Medium Risk (11/18/2023)     Readmission Risk Interventions     No data to display

## 2023-11-25 ENCOUNTER — Other Ambulatory Visit: Payer: Self-pay | Admitting: Specialist

## 2023-11-25 DIAGNOSIS — J849 Interstitial pulmonary disease, unspecified: Secondary | ICD-10-CM

## 2023-12-28 ENCOUNTER — Other Ambulatory Visit: Payer: Self-pay | Admitting: Cardiovascular Disease

## 2023-12-28 DIAGNOSIS — I2510 Atherosclerotic heart disease of native coronary artery without angina pectoris: Secondary | ICD-10-CM

## 2024-01-11 ENCOUNTER — Telehealth: Payer: Self-pay | Admitting: Cardiovascular Disease

## 2024-01-11 NOTE — Telephone Encounter (Signed)
*  STAT* If patient is at the pharmacy, call can be transferred to refill team.   1. Which medications need to be refilled? (please list name of each medication and dose if known)   atorvastatin (LIPITOR) 40 MG tablet  2. Which pharmacy/location (including street and city if local pharmacy) is medication to be sent to? [id number] - [place], [place] - [address]  3. Do they need a 30 day or 90 day supply?   90 day supply

## 2024-01-12 ENCOUNTER — Other Ambulatory Visit: Payer: Self-pay | Admitting: Emergency Medicine

## 2024-01-12 MED ORDER — ATORVASTATIN CALCIUM 40 MG PO TABS: 40.0000 mg | ORAL_TABLET | Freq: Every day | ORAL | 3 refills | Status: AC

## 2024-01-12 NOTE — Telephone Encounter (Signed)
 Wife calling saying pt is out of medication

## 2024-02-17 ENCOUNTER — Emergency Department

## 2024-02-17 ENCOUNTER — Emergency Department
Admission: EM | Admit: 2024-02-17 | Discharge: 2024-02-17 | Disposition: A | Discharge status: Home / Self Care | Attending: Emergency Medicine | Admitting: Emergency Medicine

## 2024-02-17 ENCOUNTER — Encounter: Payer: Self-pay | Admitting: Medical Oncology

## 2024-02-17 DIAGNOSIS — M549 Dorsalgia, unspecified: Secondary | ICD-10-CM

## 2024-02-17 DIAGNOSIS — Z8679 Personal history of other diseases of the circulatory system: Secondary | ICD-10-CM | POA: Insufficient documentation

## 2024-02-17 DIAGNOSIS — M546 Pain in thoracic spine: Secondary | ICD-10-CM | POA: Insufficient documentation

## 2024-02-17 DIAGNOSIS — J449 Chronic obstructive pulmonary disease, unspecified: Secondary | ICD-10-CM | POA: Insufficient documentation

## 2024-02-17 DIAGNOSIS — I2510 Atherosclerotic heart disease of native coronary artery without angina pectoris: Secondary | ICD-10-CM | POA: Insufficient documentation

## 2024-02-17 DIAGNOSIS — R0789 Other chest pain: Secondary | ICD-10-CM | POA: Diagnosis not present

## 2024-02-17 DIAGNOSIS — R079 Chest pain, unspecified: Secondary | ICD-10-CM

## 2024-02-17 LAB — COMPREHENSIVE METABOLIC PANEL WITH GFR
ALT: 35 U/L (ref 0–44)
AST: 21 U/L (ref 15–41)
Albumin: 3.3 g/dL — ABNORMAL LOW (ref 3.5–5.0)
Alkaline Phosphatase: 46 U/L (ref 38–126)
Anion gap: 3 — ABNORMAL LOW (ref 5–15)
BUN: 15 mg/dL (ref 8–23)
CO2: 25 mmol/L (ref 22–32)
Calcium: 8.4 mg/dL — ABNORMAL LOW (ref 8.9–10.3)
Chloride: 108 mmol/L (ref 98–111)
Creatinine, Ser: 0.7 mg/dL (ref 0.61–1.24)
GFR, Estimated: >60 mL/min (ref >60)
Glucose, Bld: 99 mg/dL (ref 70–99)
Potassium: 3.6 mmol/L (ref 3.5–5.1)
Sodium: 136 mmol/L (ref 135–145)
Total Bilirubin: 1.2 mg/dL (ref 0.0–1.2)
Total Protein: 6.6 g/dL (ref 6.5–8.1)

## 2024-02-17 LAB — CBC WITH DIFFERENTIAL/PLATELET
Abs Immature Granulocytes: 0.15 10*3/uL — ABNORMAL HIGH (ref 0.00–0.07)
Basophils Absolute: 0 10*3/uL (ref 0.0–0.1)
Basophils Relative: 1 %
Eosinophils Absolute: 0.1 10*3/uL (ref 0.0–0.5)
Eosinophils Relative: 1 %
HCT: 40.1 % (ref 39.0–52.0)
Hemoglobin: 14.4 g/dL (ref 13.0–17.0)
Immature Granulocytes: 2 %
Lymphocytes Relative: 12 %
Lymphs Abs: 0.9 10*3/uL (ref 0.7–4.0)
MCH: 36.3 pg — ABNORMAL HIGH (ref 26.0–34.0)
MCHC: 35.9 g/dL (ref 30.0–36.0)
MCV: 101 fL — ABNORMAL HIGH (ref 80.0–100.0)
Monocytes Absolute: 0.7 10*3/uL (ref 0.1–1.0)
Monocytes Relative: 9 %
Neutro Abs: 5.8 10*3/uL (ref 1.7–7.7)
Neutrophils Relative %: 75 %
Platelets: 133 10*3/uL — ABNORMAL LOW (ref 150–400)
RBC: 3.97 MIL/uL — ABNORMAL LOW (ref 4.22–5.81)
RDW: 13.8 % (ref 11.5–15.5)
WBC: 7.7 10*3/uL (ref 4.0–10.5)
nRBC: 0 % (ref 0.0–0.2)

## 2024-02-17 LAB — TROPONIN I (HIGH SENSITIVITY)
Troponin I (High Sensitivity): 7 ng/L (ref <18)
Troponin I (High Sensitivity): 7 ng/L (ref <18)

## 2024-02-17 LAB — LIPASE, BLOOD: Lipase: 22 U/L (ref 11–51)

## 2024-02-17 MED ORDER — MORPHINE SULFATE (PF) 4 MG/ML IV SOLN
4.0000 mg | Freq: Once | INTRAVENOUS | Status: AC
  Administered 2024-02-17: 4 mg via INTRAVENOUS
  Filled 2024-02-17: qty 1

## 2024-02-17 MED ORDER — IOHEXOL 350 MG/ML SOLN
100.0000 mL | Freq: Once | INTRAVENOUS | Status: AC | PRN
  Administered 2024-02-17: 100 mL via INTRAVENOUS

## 2024-02-17 MED ORDER — HYDROCODONE-ACETAMINOPHEN 5-325 MG PO TABS
1.0000 | ORAL_TABLET | Freq: Once | ORAL | Status: AC
  Administered 2024-02-17: 1 via ORAL
  Filled 2024-02-17: qty 1

## 2024-02-17 NOTE — ED Provider Notes (Signed)
 [facility] Provider Note    Event Date/Time   First MD Initiated Contact with Patient 02/17/24 1057     (approximate)   History   Back Pain and Shortness of Breath   HPI  Dillon [name] is a 68 year old male with history of CAD with prior MI, COPD on 5 L home O2, AAA presenting to the emergency department for evaluation of back pain.  Yesterday morning patient was trying to get his shirt off when he felt a pain in his back radiating through to his stomach.  He was giving it time to see if it improved, but was not getting better so his wife directed him to the walk-in clinic.  There, patient was directed to the ER given his clinical history.  Reports pain is in his upper back radiating into his epigastric area.  Denies history of similar.  No bowel or bladder symptoms.  Reviewed cardiology visit from 11/10/2023.  At that time, patient had follow-up ultrasound in the setting of his AAA demonstrating a largest aortic measurement of 3.2 cm.     Physical Exam   Triage Vital Signs: ED Triage Vitals  Encounter Vitals Group     BP 02/17/24 1102 (!) 158/98     Systolic BP Percentile --      Diastolic BP Percentile --      Pulse Rate 02/17/24 1102 60     Resp 02/17/24 1102 20     Temp 02/17/24 1121 98.5 F (36.9 C)     Temp Source 02/17/24 1121 Oral     SpO2 02/17/24 1102 95 %     Weight 02/17/24 1102 182 lb 15.7 oz (83 kg)     Height 02/17/24 1102 6\' 2"  (1.88 m)     Head Circumference --      Peak Flow --      Pain Score 02/17/24 1102 8     Pain Loc --      Pain Education --      Exclude from Growth Chart --     Most recent vital signs: Vitals:   02/17/24 1500 02/17/24 1507  BP: (!) 163/87   Pulse: 60   Resp: 13   Temp:  98.5 F (36.9 C)  SpO2: 99%      General: Awake, interactive  CV:  Regular rate, good peripheral perfusion.  Resp:  Unlabored respirations, lungs with auscultation Abd:  Nondistended, soft, mild tenderness in the epigastric  area, remainder of abdomen nontender Neuro:  Symmetric facial movement, fluid speech Back:  Tenderness of the mid thoracic back without focal area of point tenderness   ED Results / Procedures / Treatments   Labs (all labs ordered are listed, but only abnormal results are displayed) Labs Reviewed  CBC WITH DIFFERENTIAL/PLATELET - Abnormal; Notable for the following components:      Result Value   RBC 3.97 (*)    MCV 101.0 (*)    MCH 36.3 (*)    Platelets 133 (*)    Abs Immature Granulocytes 0.15 (*)    All other components within normal limits  COMPREHENSIVE METABOLIC PANEL WITH GFR - Abnormal; Notable for the following components:   Calcium  8.4 (*)    Albumin 3.3 (*)    Anion gap 3 (*)    All other components within normal limits  LIPASE, BLOOD  TROPONIN I (HIGH SENSITIVITY)  TROPONIN I (HIGH SENSITIVITY)     EKG EKG independently reviewed interpreted by myself (ER attending) demonstrates:  EKG  demonstrates sinus rhythm at a rate of 63, PR 149, QRS 104, QTc 419, baseline wander limits evaluation, no appreciable acute ST changes, no STEMI  RADIOLOGY Imaging independently reviewed and interpreted by myself demonstrates:  CT dissection protocol without dissection or other acute finding  Formal Radiology Read:  CT Angio Chest/Abd/Pel for Dissection W and/or Wo Contrast Result Date: 02/17/2024 CLINICAL DATA:  Central chest pain radiating to the back since yesterday with worsening shortness of breath. Hypoxia. EXAM: CT ANGIOGRAPHY CHEST, ABDOMEN AND PELVIS TECHNIQUE: Non-contrast CT of the chest was initially obtained. Multidetector CT imaging through the chest, abdomen and pelvis was performed using the standard protocol during bolus administration of intravenous contrast. Multiplanar reconstructed images and MIPs were obtained and reviewed to evaluate the vascular anatomy. RADIATION DOSE REDUCTION: This exam was performed according to the departmental dose-optimization program  which includes automated exposure control, adjustment of the mA and/or kV according to patient size and/or use of iterative reconstruction technique. CONTRAST:  OMNIPAQUE  IOHEXOL  350 MG/ML SOLN COMPARISON:  Chest CTA dated 10/02/2023. PET-CT dated 11/12/2022. Chest, abdomen and pelvis CT dated 07/08/2021. FINDINGS: CTA CHEST FINDINGS Cardiovascular: Atheromatous calcifications, including the coronary arteries and aorta. Borderline enlarged heart. Normally opacified thoracic aorta without aneurysm or dissection. Enlarged right and left main pulmonary arteries with minimal enlargement of the main pulmonary artery segment with a diameter of 3.0 cm. No pulmonary arterial filling defects seen. Mediastinum/Nodes: No enlarged mediastinal, hilar, or axillary lymph nodes. Thyroid gland, trachea, and esophagus demonstrate no significant findings. Lungs/Pleura: Stable bilateral interstitial fibrosis and centrilobular and paraseptal bullous changes. No airspace consolidation or pleural fluid. Musculoskeletal: Interval approximately 20% T9 vertebral superior endplate compression deformity with Schmorl's node formation. No visible acute fracture lines and no bony retropulsion. Mild thoracic and moderate lower cervical spine degenerative changes. Review of the MIP images confirms the above findings. CTA ABDOMEN AND PELVIS FINDINGS VASCULAR Aorta: Distal aortic atheromatous calcifications. 3.2 cm infrarenal abdominal aortic aneurysm with mild mural thrombus formation. The aorta previously measured 2.6 cm in maximum diameter on 07/08/2021. No dissection. Celiac: Tortuosity of the proximal celiac axis causing approximately 60% luminal narrowing without associated plaque formation at that location. There is minimal calcified plaque more distally without stenosis. SMA: Patent without evidence of aneurysm, dissection, vasculitis or significant stenosis. Renals: Both renal arteries are patent without evidence of aneurysm,  dissection, vasculitis, fibromuscular dysplasia or significant stenosis. IMA: Patent without evidence of aneurysm, dissection, vasculitis or significant stenosis. Inflow: Atheromatous changes without significant stenosis. Veins: No obvious venous abnormality within the limitations of this arterial phase study. Review of the MIP images confirms the above findings. NON-VASCULAR Hepatobiliary: No focal liver abnormality is seen. No gallstones, gallbladder wall thickening, or biliary dilatation. Pancreas: Unremarkable. No pancreatic ductal dilatation or surrounding inflammatory changes. Spleen: Normal in size without focal abnormality. Adrenals/Urinary Tract: Adrenal glands are unremarkable. Kidneys are normal, without renal calculi, focal lesion, or hydronephrosis. Bladder is unremarkable. Stomach/Bowel: Sigmoid and descending colon diverticulosis without evidence of diverticulitis. Normal-appearing appendix. Unremarkable small bowel and stomach. Lymphatic: No enlarged lymph nodes. Reproductive: Prostate is unremarkable. Other: No abdominal wall hernia or abnormality. No abdominopelvic ascites. Musculoskeletal: Lumbar spine degenerative changes. Review of the MIP images confirms the above findings. IMPRESSION: 1. No aortic dissection or thoracic aneurysm. 2. 3.2 cm infrarenal abdominal aortic aneurysm, increased in size since 07/08/2021. Recommend follow-up ultrasound every 3 years. 3. Approximately 60% luminal narrowing of the proximal celiac axis due to tortuosity. 4. Stable bilateral interstitial fibrosis and centrilobular and paraseptal bullous  emphysema. 5.  Calcific coronary artery and aortic atherosclerosis. 6. Sigmoid and descending colon diverticulosis. Aortic Atherosclerosis (ICD10-I70.0) and Emphysema (ICD10-J43.9). Electronically Signed   By: Catherin Closs M.D.   On: 02/17/2024 12:18    PROCEDURES:  Critical Care performed: No  Procedures   MEDICATIONS ORDERED IN ED: Medications  iohexol   (OMNIPAQUE ) 350 MG/ML injection 100 mL (100 mLs Intravenous Contrast Given 02/17/24 1138)  morphine  (PF) 4 MG/ML injection 4 mg (4 mg Intravenous Given 02/17/24 1131)  HYDROcodone -acetaminophen  (NORCO/VICODIN) 5-325 MG per tablet 1 tablet (1 tablet Oral Given 02/17/24 1520)     IMPRESSION / MDM / ASSESSMENT AND PLAN / ED COURSE  I reviewed the triage vital signs and the nursing notes.  Differential diagnosis includes, but is not limited to, aortic dissection, AAA rupture, ACS, pneumonia, pneumothorax, pancreatitis  Patient's presentation is most consistent with acute presentation with potential threat to life or bodily function.  68 year old male with history of MI and AAA presenting to the emergency department for acute onset back pain with radiation to his epigastric area.  Fortunately with stable vitals, but with clinical history will obtain CT dissection protocol.  Radiology notified to bypass labs.   4:13 PM Dissection study fortunately without evidence of dissection or AAA rupture.  Labs overall reassuring including negative troponin x 2.  EKG without acute ischemic findings.  Patient reassessed.  Report significant improvement in pain.  He is eager to be discharged home.  With clinical history, suspect more musculoskeletal pain, do think discharge and outpatient follow-up is reasonable.  Strict return precautions provided.  Patient discharged stable condition.    FINAL CLINICAL IMPRESSION(S) / ED DIAGNOSES   Final diagnoses:  Upper back pain  History of abdominal aortic aneurysm (AAA)  Nonspecific chest pain     Rx / DC Orders   ED Discharge Orders     None        Note:  This document was prepared using Dragon voice recognition software and may include unintentional dictation errors.   Claria Crofts, MD 02/17/24 [phone]

## 2024-02-17 NOTE — Discharge Instructions (Addendum)
 You were seen in the emergency department today for evaluation of your back and chest pain. Your testing fortunately did not show an emergency cause for this.  Follow-up with cardiology for further evaluation.  Return to the ER for new or worsening symptoms.

## 2024-02-17 NOTE — ED Triage Notes (Signed)
 Pt from [facility] with reports that pt began having central chest pain with pain that radiates into his back yesterday with worsening sob. Pt wears 5LNC at all times, came over on 6L from [facility].

## 2024-02-24 ENCOUNTER — Emergency Department

## 2024-02-24 ENCOUNTER — Other Ambulatory Visit: Payer: Self-pay

## 2024-02-24 ENCOUNTER — Inpatient Hospital Stay
Admission: EM | Admit: 2024-02-24 | Discharge: 2024-03-08 | DRG: 199 | Disposition: A | Discharge status: Other Acute Inpatient Hospital | Attending: Internal Medicine | Admitting: Internal Medicine

## 2024-02-24 DIAGNOSIS — D696 Thrombocytopenia, unspecified: Secondary | ICD-10-CM | POA: Diagnosis present

## 2024-02-24 DIAGNOSIS — J9383 Other pneumothorax: Secondary | ICD-10-CM | POA: Diagnosis not present

## 2024-02-24 DIAGNOSIS — Z87891 Personal history of nicotine dependence: Secondary | ICD-10-CM

## 2024-02-24 DIAGNOSIS — J860 Pyothorax with fistula: Secondary | ICD-10-CM | POA: Diagnosis not present

## 2024-02-24 DIAGNOSIS — J9621 Acute and chronic respiratory failure with hypoxia: Secondary | ICD-10-CM | POA: Diagnosis present

## 2024-02-24 DIAGNOSIS — I252 Old myocardial infarction: Secondary | ICD-10-CM

## 2024-02-24 DIAGNOSIS — E7800 Pure hypercholesterolemia, unspecified: Secondary | ICD-10-CM | POA: Diagnosis present

## 2024-02-24 DIAGNOSIS — J9611 Chronic respiratory failure with hypoxia: Secondary | ICD-10-CM | POA: Diagnosis not present

## 2024-02-24 DIAGNOSIS — Z888 Allergy status to other drugs, medicaments and biological substances status: Secondary | ICD-10-CM | POA: Diagnosis not present

## 2024-02-24 DIAGNOSIS — J8410 Pulmonary fibrosis, unspecified: Secondary | ICD-10-CM | POA: Diagnosis present

## 2024-02-24 DIAGNOSIS — I5032 Chronic diastolic (congestive) heart failure: Secondary | ICD-10-CM | POA: Diagnosis present

## 2024-02-24 DIAGNOSIS — J449 Chronic obstructive pulmonary disease, unspecified: Secondary | ICD-10-CM | POA: Diagnosis not present

## 2024-02-24 DIAGNOSIS — I10 Essential (primary) hypertension: Secondary | ICD-10-CM | POA: Diagnosis present

## 2024-02-24 DIAGNOSIS — I2489 Other forms of acute ischemic heart disease: Secondary | ICD-10-CM | POA: Diagnosis not present

## 2024-02-24 DIAGNOSIS — I2723 Pulmonary hypertension due to lung diseases and hypoxia: Secondary | ICD-10-CM | POA: Diagnosis present

## 2024-02-24 DIAGNOSIS — J84112 Idiopathic pulmonary fibrosis: Secondary | ICD-10-CM | POA: Diagnosis not present

## 2024-02-24 DIAGNOSIS — Z7951 Long term (current) use of inhaled steroids: Secondary | ICD-10-CM

## 2024-02-24 DIAGNOSIS — J432 Centrilobular emphysema: Secondary | ICD-10-CM | POA: Diagnosis present

## 2024-02-24 DIAGNOSIS — Z7952 Long term (current) use of systemic steroids: Secondary | ICD-10-CM

## 2024-02-24 DIAGNOSIS — K219 Gastro-esophageal reflux disease without esophagitis: Secondary | ICD-10-CM | POA: Diagnosis present

## 2024-02-24 DIAGNOSIS — I5030 Unspecified diastolic (congestive) heart failure: Secondary | ICD-10-CM | POA: Diagnosis not present

## 2024-02-24 DIAGNOSIS — G2581 Restless legs syndrome: Secondary | ICD-10-CM | POA: Diagnosis present

## 2024-02-24 DIAGNOSIS — Z7902 Long term (current) use of antithrombotics/antiplatelets: Secondary | ICD-10-CM

## 2024-02-24 DIAGNOSIS — J9601 Acute respiratory failure with hypoxia: Secondary | ICD-10-CM

## 2024-02-24 DIAGNOSIS — I2583 Coronary atherosclerosis due to lipid rich plaque: Secondary | ICD-10-CM | POA: Diagnosis present

## 2024-02-24 DIAGNOSIS — Z9981 Dependence on supplemental oxygen: Secondary | ICD-10-CM

## 2024-02-24 DIAGNOSIS — I110 Hypertensive heart disease with heart failure: Secondary | ICD-10-CM | POA: Diagnosis present

## 2024-02-24 DIAGNOSIS — J9382 Other air leak: Secondary | ICD-10-CM | POA: Diagnosis not present

## 2024-02-24 DIAGNOSIS — J939 Pneumothorax, unspecified: Secondary | ICD-10-CM | POA: Diagnosis not present

## 2024-02-24 DIAGNOSIS — E8729 Other acidosis: Secondary | ICD-10-CM | POA: Diagnosis present

## 2024-02-24 DIAGNOSIS — Z955 Presence of coronary angioplasty implant and graft: Secondary | ICD-10-CM | POA: Diagnosis not present

## 2024-02-24 DIAGNOSIS — R0602 Shortness of breath: Secondary | ICD-10-CM | POA: Diagnosis present

## 2024-02-24 DIAGNOSIS — J9811 Atelectasis: Secondary | ICD-10-CM | POA: Diagnosis present

## 2024-02-24 DIAGNOSIS — J9311 Primary spontaneous pneumothorax: Secondary | ICD-10-CM | POA: Diagnosis not present

## 2024-02-24 DIAGNOSIS — I2510 Atherosclerotic heart disease of native coronary artery without angina pectoris: Secondary | ICD-10-CM | POA: Diagnosis present

## 2024-02-24 DIAGNOSIS — Z8249 Family history of ischemic heart disease and other diseases of the circulatory system: Secondary | ICD-10-CM

## 2024-02-24 DIAGNOSIS — Z91018 Allergy to other foods: Secondary | ICD-10-CM

## 2024-02-24 DIAGNOSIS — Z79899 Other long term (current) drug therapy: Secondary | ICD-10-CM

## 2024-02-24 LAB — COMPREHENSIVE METABOLIC PANEL WITH GFR
ALT: 40 U/L (ref 0–44)
AST: 30 U/L (ref 15–41)
Albumin: 3.7 g/dL (ref 3.5–5.0)
Alkaline Phosphatase: 59 U/L (ref 38–126)
Anion gap: 12 (ref 5–15)
BUN: 17 mg/dL (ref 8–23)
CO2: 23 mmol/L (ref 22–32)
Calcium: 9 mg/dL (ref 8.9–10.3)
Chloride: 101 mmol/L (ref 98–111)
Creatinine, Ser: 0.75 mg/dL (ref 0.61–1.24)
GFR, Estimated: >60 mL/min (ref >60)
Glucose, Bld: 185 mg/dL — ABNORMAL HIGH (ref 70–99)
Potassium: 4 mmol/L (ref 3.5–5.1)
Sodium: 136 mmol/L (ref 135–145)
Total Bilirubin: 1 mg/dL (ref 0.0–1.2)
Total Protein: 7.5 g/dL (ref 6.5–8.1)

## 2024-02-24 LAB — BRAIN NATRIURETIC PEPTIDE: B Natriuretic Peptide: 36.7 pg/mL (ref 0.0–100.0)

## 2024-02-24 LAB — CBC WITH DIFFERENTIAL/PLATELET
Abs Immature Granulocytes: 0.53 10*3/uL — ABNORMAL HIGH (ref 0.00–0.07)
Basophils Absolute: 0.1 10*3/uL (ref 0.0–0.1)
Basophils Relative: 1 %
Eosinophils Absolute: 0.1 10*3/uL (ref 0.0–0.5)
Eosinophils Relative: 1 %
HCT: 46.3 % (ref 39.0–52.0)
Hemoglobin: 15.7 g/dL (ref 13.0–17.0)
Immature Granulocytes: 4 %
Lymphocytes Relative: 35 %
Lymphs Abs: 4.6 10*3/uL — ABNORMAL HIGH (ref 0.7–4.0)
MCH: 35.4 pg — ABNORMAL HIGH (ref 26.0–34.0)
MCHC: 33.9 g/dL (ref 30.0–36.0)
MCV: 104.5 fL — ABNORMAL HIGH (ref 80.0–100.0)
Monocytes Absolute: 1.3 10*3/uL — ABNORMAL HIGH (ref 0.1–1.0)
Monocytes Relative: 10 %
Neutro Abs: 6.6 10*3/uL (ref 1.7–7.7)
Neutrophils Relative %: 49 %
Platelets: 192 10*3/uL (ref 150–400)
RBC: 4.43 MIL/uL (ref 4.22–5.81)
RDW: 13.9 % (ref 11.5–15.5)
WBC: 13.2 10*3/uL — ABNORMAL HIGH (ref 4.0–10.5)
nRBC: 0 % (ref 0.0–0.2)

## 2024-02-24 LAB — BLOOD GAS, VENOUS
Acid-base deficit: 4.5 mmol/L — ABNORMAL HIGH (ref 0.0–2.0)
Bicarbonate: 24.4 mmol/L (ref 20.0–28.0)
O2 Saturation: 91 %
Patient temperature: 37
pCO2, Ven: 61 mmHg — ABNORMAL HIGH (ref 44–60)
pH, Ven: 7.21 — ABNORMAL LOW (ref 7.25–7.43)
pO2, Ven: 67 mmHg — ABNORMAL HIGH (ref 32–45)

## 2024-02-24 LAB — TROPONIN I (HIGH SENSITIVITY)
Troponin I (High Sensitivity): 13 ng/L (ref <18)
Troponin I (High Sensitivity): 140 ng/L (ref <18)

## 2024-02-24 MED ORDER — MORPHINE SULFATE (PF) 4 MG/ML IV SOLN
INTRAVENOUS | Status: AC
  Filled 2024-02-24: qty 1

## 2024-02-24 MED ORDER — HYDROMORPHONE HCL 1 MG/ML IJ SOLN
1.0000 mg | INTRAMUSCULAR | Status: DC | PRN
  Administered 2024-02-24 – 2024-02-28 (×6): 1 mg via INTRAVENOUS
  Filled 2024-02-24 (×7): qty 1

## 2024-02-24 MED ORDER — MORPHINE SULFATE (PF) 4 MG/ML IV SOLN
4.0000 mg | Freq: Once | INTRAVENOUS | Status: AC
  Administered 2024-02-24: 4 mg via INTRAVENOUS

## 2024-02-24 MED ORDER — ONDANSETRON HCL 4 MG PO TABS: 4.0000 mg | ORAL_TABLET | Freq: Four times a day (QID) | ORAL | Status: DC | PRN

## 2024-02-24 MED ORDER — ENOXAPARIN SODIUM 40 MG/0.4ML IJ SOSY
40.0000 mg | PREFILLED_SYRINGE | INTRAMUSCULAR | Status: DC
  Administered 2024-02-24 – 2024-03-07 (×13): 40 mg via SUBCUTANEOUS
  Filled 2024-02-24 (×12): qty 0.4

## 2024-02-24 MED ORDER — MORPHINE SULFATE (PF) 4 MG/ML IV SOLN
4.0000 mg | Freq: Once | INTRAVENOUS | Status: AC
  Administered 2024-02-24: 4 mg via INTRAVENOUS
  Filled 2024-02-24: qty 1

## 2024-02-24 MED ORDER — PIRFENIDONE 267 MG PO CAPS
801.0000 mg | ORAL_CAPSULE | Freq: Three times a day (TID) | ORAL | Status: DC
  Administered 2024-02-25: 801 mg via ORAL
  Filled 2024-02-24 (×2): qty 90

## 2024-02-24 MED ORDER — ONDANSETRON HCL 4 MG/2ML IJ SOLN
4.0000 mg | Freq: Four times a day (QID) | INTRAMUSCULAR | Status: DC | PRN
  Administered 2024-02-24: 4 mg via INTRAVENOUS
  Filled 2024-02-24: qty 2

## 2024-02-24 MED ORDER — LIDOCAINE-EPINEPHRINE 2 %-1:100000 IJ SOLN
20.0000 mL | Freq: Once | INTRAMUSCULAR | Status: AC
  Administered 2024-02-24: 20 mL

## 2024-02-24 NOTE — Assessment & Plan Note (Signed)
 BP stable Titrate home regimen

## 2024-02-24 NOTE — ED Notes (Signed)
 RN to bedside. Pt on NRB and 2 Rns and medic student at bedsdie to prep for chest tube insertion. Family at bedside

## 2024-02-24 NOTE — Assessment & Plan Note (Signed)
 PPI ?

## 2024-02-24 NOTE — ED Provider Notes (Signed)
 Emergency Medicine Provider Triage Evaluation Note  Dillon [name] , a 68 y.o. male  was evaluated in triage.  Pt complains of 1 week of right sided back pain, acutely worse this morning with shortness of breath while showering.  Pain now extends across the right side of his chest and patient noted to be hypoxic with EMS, subsequently placed on CPAP.  He denies any fevers or cough, was given IV Solu-Medrol  and DuoNeb with EMS.  Review of Systems  Positive: Chest pain, shortness of breath, back pain. Negative: Fever, cough, leg swelling.  Physical Exam  Pulse (!) 106   Temp 97.6 F (36.4 C) (Axillary)   Resp (!) 37   SpO2 96%  Gen:   Awake, moderate respiratory distress noted. Resp:  Tachypneic with increased work of breathing, diminished breath sounds noted on right. MSK:   Moves extremities without difficulty  Medical Decision Making  Medically screening exam initiated at 7:02 AM.  Appropriate orders placed.  [name] was informed that the remainder of the evaluation will be completed by another provider, this initial triage assessment does not replace that evaluation, and the importance of remaining in the ED until their evaluation is complete.  Patient arrived just before shift change and workup initiated, EKG with sinus tachycardia but no ischemic changes noted.  Chest x-ray by my read with significant right-sided pneumothorax, patient taken off of BiPAP and placed on nonrebreather.  Dr. Vicenta Graft to assume care and is currently preparing for chest tube placement.   Twilla Galea, MD 02/24/24 [phone]

## 2024-02-24 NOTE — Evaluation (Signed)
 Occupational Therapy Evaluation Patient Details Name: Dillon [name] MRN: [id number] DOB: Oct 07, 1956 Today's Date: 02/24/2024   History of Present Illness   68 y.o. male with medical history significant of chronic respiratory failure on 4-5 L, COPD, GERD, pulmonary fibrosis, restless leg syndrome and coronary artery disease presenting with acute on chronic respiratory failure with hypoxia and pneumothorax.  Patient reports acute onset of severe right-sided chest pain and shortness of breath earlier this morning.  Patient denies any recent trauma.  Patient states that he did have some similar pain on the posterior chest and right back last week associated with pulling shirt off with some excessive exertion per the patient.  Baseline pulmonary fibrosis and COPD.  No reported wheezing and cough.  No focal hemiparesis or confusion.  No abdominal pain.  Positive mild right-sided chest pain with deep breathing and movement.  Has been compliant otherwise with home respiratory regimen.  Follows Dr. Jamal Mays outpatient pulmonology.  Presented to the ER afebrile, heart rate into the 100s, initially requiring BiPAP.  Noted moderate to large right-sided pneumothorax on chest x-ray.  Chest tube with pigtail catheter placed by ER physician.  With improvement in respiratory status back to 4 to 5 L nasal cannula with O2 sats in the mid to upper 90s.  White count 13.2, hemoglobin 15.7, platelets 192, positive decompensated respiratory acidosis initially on VBG.  Creatinine 0.75.  Glucose 185.  Troponin 13.  BNP within normal limits.  Follow-up chest x-ray with small to moderate pneumothorax.     Clinical Impressions Patient presenting with decreased Ind in self care,balance,functional mobility/transfers, endurance, and safety awareness. Patient reports living at home with wife, Dillon [name], and working part time as a Public house manager. Pt drives and is very independent but is on 5Ls via [place] at baseline. Pt on 7Ls during  this evaluation. Patient currently functioning at min A for bed mobility and to stand with hand over hand assistance. Pt stands with min A and stands for 1 minute and takes several  steps to the R but has increasing pain in R side near chest tube. O2 desaturation into the low 80's but recovers with cues for pursed lip breathing once returning back to bed. Patient will benefit from acute OT to increase overall independence in the areas of ADLs, functional mobility, and safety awareness in order to safely discharge.     If plan is discharge home, recommend the following:   A little help with walking and/or transfers;A little help with bathing/dressing/bathroom;Assistance with cooking/housework;Assist for transportation;Help with stairs or ramp for entrance     Functional Status Assessment   Patient has had a recent decline in their functional status and demonstrates the ability to make significant improvements in function in a reasonable and predictable amount of time.     Equipment Recommendations   None recommended by OT     Recommendations for Other Services         Precautions/Restrictions   Precautions Precautions: Fall Precaution/Restrictions Comments: chest tube     Mobility Bed Mobility Overal bed mobility: Needs Assistance Bed Mobility: Supine to Sit, Sit to Supine     Supine to sit: Contact guard Sit to supine: Contact guard assist        Transfers Overall transfer level: Needs assistance Equipment used: 1 person hand held assist Transfers: Sit to/from Stand Sit to Stand: Contact guard assist                  Balance Overall balance assessment: Needs  assistance Sitting-balance support: Feet supported Sitting balance-Leahy Scale: Good     Standing balance support: Reliant on assistive device for balance, During functional activity Standing balance-Leahy Scale: Fair                             ADL either performed or assessed  with clinical judgement   ADL Overall ADL's : Needs assistance/impaired                                       General ADL Comments: CGA- min hand held assistance for simulated toilet transfer. Pt would likely need min A for LB dressing secondary to pain.     Vision Baseline Vision/History: 1 Wears glasses Patient Visual Report: No change from baseline              Pertinent Vitals/Pain Pain Assessment Pain Assessment: Faces Faces Pain Scale: Hurts even more Pain Location: R chest Pain Descriptors / Indicators: Discomfort, Guarding, Grimacing, Sore Pain Intervention(s): Limited activity within patient's tolerance, Monitored during session, Repositioned, Premedicated before session     Extremity/Trunk Assessment Upper Extremity Assessment Upper Extremity Assessment: Overall WFL for tasks assessed;Generalized weakness (limited R side strain/testing 2/2 pain)   Lower Extremity Assessment Lower Extremity Assessment: Overall WFL for tasks assessed       Communication Communication Communication: No apparent difficulties   Cognition Arousal: Alert Behavior During Therapy: WFL for tasks assessed/performed Cognition: No apparent impairments                               Following commands: Intact       Cueing  General Comments   Cueing Techniques: Verbal cues              Home Living Family/patient expects to be discharged to:: Private residence Living Arrangements: Spouse/significant other Available Help at Discharge: Family;Available 24 hours/day Type of Home: House Home Access: Stairs to enter [employer] of Steps: 6 in back, 2 in front but front is father away Entrance Stairs-Rails: Left Home Layout: One level     Bathroom Shower/Tub: Chief Strategy Officer: Standard Bathroom Accessibility: Yes   Home Equipment: Scientist, research (medical) (4 wheels)   Additional Comments: 5Ls via [place] at baseline       Prior Functioning/Environment Prior Level of Function : Independent/Modified Independent;Working/employed;Driving             Mobility Comments: amb with no AD, intermittent use mwc for [place] distances ADLs Comments: Pt works part time as a Public house manager. Wife assists with cooking and cleaning. He is mod I - I with ADLs.    OT Problem List: Decreased strength;Pain;Decreased activity tolerance;Decreased safety awareness;Impaired balance (sitting and/or standing);Decreased knowledge of use of DME or AE   OT Treatment/Interventions: Self-care/ADL training;Therapeutic exercise;Therapeutic activities;Energy conservation;DME and/or AE instruction;Patient/family education;Balance training      OT Goals(Current goals can be found in the care plan section)   Acute Rehab OT Goals Patient Stated Goal: to go home OT Goal Formulation: With patient/family Time For Goal Achievement: 03/09/24 Potential to Achieve Goals: Fair ADL Goals Pt Will Perform Grooming: with modified independence;standing Pt Will Perform Lower Body Dressing: with modified independence;sit to/from stand Pt Will Transfer to Toilet: with modified independence;ambulating Pt Will Perform Toileting - Clothing Manipulation and hygiene:  with modified independence;sit to/from stand   OT Frequency:  Min 3X/week       AM-PAC OT "6 Clicks" Daily Activity     Outcome Measure Help from another person eating meals?: None Help from another person taking care of personal grooming?: None Help from another person toileting, which includes using toliet, bedpan, or urinal?: A Little Help from another person bathing (including washing, rinsing, drying)?: A Little Help from another person to put on and taking off regular upper body clothing?: A Little Help from another person to put on and taking off regular lower body clothing?: A Little 6 Click Score: 20   End of Session Nurse Communication: Mobility  status  Activity Tolerance: Patient tolerated treatment well Patient left: in bed;with call bell/phone within reach;with bed alarm set  OT Visit Diagnosis: Unsteadiness on feet (R26.81);Repeated falls (R29.6);Muscle weakness (generalized) (M62.81);Pain Pain - part of body:  (R chest)                Time: 1610-9604 OT Time Calculation (min): 24 min Charges:  OT General Charges $OT Visit: 1 Visit OT Evaluation $OT Eval Low Complexity: 1 Low OT Treatments $Therapeutic Activity: 8-22 mins  George Kinder, MS, OTR/L , CBIS ascom [phone]  02/24/24, 12:51 PM

## 2024-02-24 NOTE — Assessment & Plan Note (Addendum)
 Pneumothorax Acute decompensated respiratory status initially requiring BiPAP  in the setting of spontaneous large pneumothorax on chest x-ray with chronic pulmonary fibrosis and COPD Status post chest tube and pigtail catheter in the ER Pulmonary consulting for further management Does not appear to be in acute flare of COPD or pulmonary fibrosis at present Defer IV steroids for now-continue chronic prednisone  Continue supplemental oxygen  Follow-up pulmonary recommendations

## 2024-02-24 NOTE — Assessment & Plan Note (Signed)
 2D echo January 2025 with EF of 55 to 60% and grade 1 diastolic dysfunction Appears euvolemic at present Monitor volume status closely in the setting of recent pneumothorax Follow

## 2024-02-24 NOTE — NC FL2 (Signed)
   MEDICAID FL2 LEVEL OF CARE FORM     IDENTIFICATION  Patient Name: Dillon [name] Birthdate: 1956/04/25 Sex: male Admission Date (Current Location): 02/24/2024  [facility] and [place] Number:  Chiropodist and Address:  [facility], [address], [place], [place] [address]      Provider Number: [id number]  Attending Physician Name and Address:  Corrinne Din, MD  Relative Name and Phone Number:  Taite Malin, wife, [phone]    Current Level of Care: Hospital Recommended Level of Care: Skilled Nursing Facility Prior Approval Number:    Date Approved/Denied:   PASRR Number: Will get prior to discharge  Discharge Plan: SNF    Current Diagnoses: Patient Active Problem List   Diagnosis Date Noted   Pneumothorax 02/24/2024   Chronic heart failure with preserved ejection fraction (HFpEF) (HCC) 02/24/2024   COPD with acute exacerbation (HCC) 11/17/2023   Pulmonary hypertension (HCC) 11/17/2023   GERD without esophagitis 11/17/2023   Coronary artery disease 11/17/2023   Acute respiratory failure with hypoxia (HCC) 11/16/2023   Acute on chronic hypoxic respiratory failure (HCC) 10/24/2022   Difficult or painful urination 12/23/2021   Impaired fasting glucose    Non-ST elevation (NSTEMI) myocardial infarction (HCC) 09/30/2021   Chest pain 09/30/2021   Elevated glucose level 09/30/2021   Weight loss 06/12/2021   Abrasion of left arm 12/07/2020   Chronic obstructive pulmonary disease (HCC) 12/07/2020   Essential hypertension 12/07/2020   Fever in adult 09/07/2020   Dyslipidemia 09/07/2020   Cellulitis of left buttock 06/11/2020   Pulmonary fibrosis (HCC) 05/11/2020   Coronary artery disease due to lipid rich plaque 05/11/2020   Dermatitis 05/11/2020   Disorder of left rotator cuff 03/09/2020   ED (erectile dysfunction) of non-organic origin 03/09/2020   ST elevation myocardial infarction (STEMI) (HCC)    COVID-19 virus  infection    Acute ST elevation myocardial infarction (STEMI) of anterior wall (HCC) 09/30/2019   Annual physical exam    Rectal polyp    Benign neoplasm of ascending colon    Internal hemorrhoids    Proctitis    Diverticulosis of large intestine without diverticulitis     Orientation RESPIRATION BLADDER Height & Weight     Self, Time, Situation, Place  O2 (6 HFNC) Continent Weight:   Height:     BEHAVIORAL SYMPTOMS/MOOD NEUROLOGICAL BOWEL NUTRITION STATUS      Continent Diet (Diet heart healthy/carb modified Room service appropriate? Yes; Fluid consistency: Thin)  AMBULATORY STATUS COMMUNICATION OF NEEDS Skin     Verbally Normal, Other (Comment) (chest tube 1 lateral; right pleural 10 fr)                       Personal Care Assistance Level of Assistance  Bathing, Feeding, Dressing Bathing Assistance: Limited assistance Feeding assistance: Limited assistance Dressing Assistance: Limited assistance     Functional Limitations Info  Sight, Hearing, Speech Sight Info: Adequate Hearing Info: Adequate Speech Info: Adequate    SPECIAL CARE FACTORS FREQUENCY  PT (By licensed PT), OT (By licensed OT)     PT Frequency: 5 times per week OT Frequency: 5 times per week            Contractures Contractures Info: Not present    Additional Factors Info  Code Status, Allergies Code Status Info: full Allergies Info: Cinnamon; Ace Inhibitors           Current Medications (02/24/2024):  This is the current hospital active  medication list Current Facility-Administered Medications  Medication Dose Route Frequency Provider Last Rate Last Admin   enoxaparin  (LOVENOX ) injection 40 mg  40 mg Subcutaneous Q24H Newton, Steven J, MD       HYDROmorphone (DILAUDID) injection 1 mg  1 mg Intravenous Q2H PRN Newton, Steven J, MD   1 mg at 02/24/24 1602   ondansetron  (ZOFRAN ) tablet 4 mg  4 mg Oral Q6H PRN Corrinne Din, MD       Or   ondansetron  (ZOFRAN ) injection 4 mg  4 mg  Intravenous Q6H PRN Newton, Steven J, MD       Current Outpatient Medications  Medication Sig Dispense Refill   acetaminophen  (TYLENOL ) 500 MG tablet Take 1,000 mg by mouth every 8 (eight) hours as needed for moderate pain.     albuterol  (PROVENTIL ) (2.5 MG/3ML) 0.083% nebulizer solution SMARTSIG:3 Milliliter(s) Via Nebulizer Every 6 Hours PRN     ALPHA LIPOIC ACID PO Take 500-1,000 mg by mouth daily.     atorvastatin  (LIPITOR ) 40 MG tablet Take 1 tablet (40 mg total) by mouth daily. 90 tablet 3   carvedilol  (COREG ) 6.25 MG tablet Take 1 tablet (6.25 mg total) by mouth 2 (two) times daily with a meal. 180 tablet 3   clopidogrel  (PLAVIX ) 75 MG tablet TAKE 1/2 TABLET (37.5 MG TOTAL) BY MOUTH EVERY OTHER DAY. 22 tablet 6   Fluticasone -Umeclidin-Vilant (TRELEGY ELLIPTA) 100-62.5-25 MCG/ACT AEPB Inhale 1 puff into the lungs daily. 60 each 1   nitroGLYCERIN  (NITROSTAT ) 0.4 MG SL tablet Place 1 tablet (0.4 mg total) under the tongue every 5 (five) minutes x 3 doses as needed for chest pain. 25 tablet 3   pantoprazole  (PROTONIX ) 40 MG tablet Take 40 mg by mouth daily.     Pirfenidone  (ESBRIET ) 267 MG CAPS Take 3 capsules by mouth 3 (three) times daily.     predniSONE  (DELTASONE ) 20 MG tablet Take 20 mg by mouth daily.     roflumilast  (DALIRESP ) 500 MCG TABS tablet Take 1 tablet by mouth daily. Every other day     Treprostinil  (TYVASO  STARTER KIT) 0.6 MG/ML SOLN Inhale 18 mcg into the lungs 4 (four) times daily.     OXYGEN  Inhale 4 L into the lungs continuous.       Discharge Medications: Please see discharge summary for a list of discharge medications.  Relevant Imaging Results:  Relevant Lab Results:   Additional Information SS#: [id number]  Cari Char Ariely Riddell, LCSW

## 2024-02-24 NOTE — Progress Notes (Signed)
 Patient has a critical lab value for troponin (140). Provider notified. Patient on telemetry.

## 2024-02-24 NOTE — TOC Initial Note (Signed)
 Transition of Care [facility]) - Initial/Assessment Note    Patient Details  Name: Dillon [name] MRN: [id number] Date of Birth: 1955/11/30  [facility] CM/SW Contact:    Elmira Haddock, LCSW Phone Number: 02/24/2024, 4:08 PM  Clinical Narrative:              CSW met with patient at bedside with his wife and daughter present.  CSW introduced self and reason for visit.  Patient's PCP not listed, however, he states that his PCP is Dr. Jacquiline Maul, MD.  [facility]  He doesn't have any concerns about getting his medications when needed.  Patient lives in the home with wife, [name] [phone]).  Patient states that he has a WC at home that he doesn't use and he uses 5L [place] at home.    CSW discussed recommendations from Physical Therapy, which is rehab.  Patient declined, but said that he'd think about it.  CSW stated that, if he doesn't want rehab, that he could go home with Home Health.  Patient unsure of what he wants to do at this time.  CSW will follow-up tomorrow for discharge disposition.  Expected Discharge Plan:  (Unknown at this time.  SNF/HH  recommended to patient.) Barriers to Discharge: Continued Medical Work up   Patient Goals and CMS Choice   CMS Medicare.gov Compare Post Acute Care list provided to:: Patient Choice offered to / list presented to : Patient      Expected Discharge Plan and Services       Living arrangements for the past 2 months: Single Family Home                                      Prior Living Arrangements/Services Living arrangements for the past 2 months: Single Family Home Lives with:: Spouse Patient language and need for interpreter reviewed:: Yes Do you feel safe going back to the place where you live?: Yes      Need for Family Participation in Patient Care: No (Comment) Care giver support system in place?: Yes (comment)   Criminal Activity/Legal Involvement Pertinent to Current Situation/Hospitalization: No -  Comment as needed  Activities of Daily Living   ADL Screening (condition at time of admission) Independently performs ADLs?: Yes (appropriate for developmental age) Is the patient deaf or have difficulty hearing?: No Does the patient have difficulty seeing, even when wearing glasses/contacts?: No Does the patient have difficulty concentrating, remembering, or making decisions?: No  Permission Sought/Granted                  Emotional Assessment Appearance:: Appears stated age Attitude/Demeanor/Rapport: Apprehensive Affect (typically observed): Appropriate Orientation: : Oriented to Self, Oriented to Place, Oriented to  Time, Oriented to Situation Alcohol  / Substance Use: Not Applicable Psych Involvement: No (comment)  Admission diagnosis:  Pneumothorax [J93.9] Patient Active Problem List   Diagnosis Date Noted   Pneumothorax 02/24/2024   Chronic heart failure with preserved ejection fraction (HFpEF) (HCC) 02/24/2024   COPD with acute exacerbation (HCC) 11/17/2023   Pulmonary hypertension (HCC) 11/17/2023   GERD without esophagitis 11/17/2023   Coronary artery disease 11/17/2023   Acute respiratory failure with hypoxia (HCC) 11/16/2023   Acute on chronic hypoxic respiratory failure (HCC) 10/24/2022   Difficult or painful urination 12/23/2021   Impaired fasting glucose    Non-ST elevation (NSTEMI) myocardial infarction (HCC) 09/30/2021   Chest pain 09/30/2021  Elevated glucose level 09/30/2021   Weight loss 06/12/2021   Abrasion of left arm 12/07/2020   Chronic obstructive pulmonary disease (HCC) 12/07/2020   Essential hypertension 12/07/2020   Fever in adult 09/07/2020   Dyslipidemia 09/07/2020   Cellulitis of left buttock 06/11/2020   Pulmonary fibrosis (HCC) 05/11/2020   Coronary artery disease due to lipid rich plaque 05/11/2020   Dermatitis 05/11/2020   Disorder of left rotator cuff 03/09/2020   ED (erectile dysfunction) of non-organic origin 03/09/2020   ST  elevation myocardial infarction (STEMI) (HCC)    COVID-19 virus infection    Acute ST elevation myocardial infarction (STEMI) of anterior wall (HCC) 09/30/2019   Annual physical exam    Rectal polyp    Benign neoplasm of ascending colon    Internal hemorrhoids    Proctitis    Diverticulosis of large intestine without diverticulitis    PCP:  Pcp, No [facility]acy[facility]   [facility] [address] [place] [facility] [address] [place] [address] Phone: [phone] Fax: [phone]     Social Drivers of Health (SDOH) Social History: SDOH Screenings   Food Insecurity: No Food Insecurity (02/24/2024)  Housing: Low Risk  (02/24/2024)  Transportation Needs: No Transportation Needs (02/24/2024)  Utilities: Not At Risk (02/24/2024)  Depression (PHQ2-9): Low Risk  (12/23/2021)  Financial Resource Strain: Low Risk  (01/21/2024)   Received from [facility]  Social Connections: Moderately Integrated (02/24/2024)  Tobacco Use: Medium Risk (02/24/2024)   SDOH Interventions:     Readmission Risk Interventions     No data to display

## 2024-02-24 NOTE — H&P (Signed)
 History and Physical    Patient: Dillon [name] ZOX:[id number] DOB: 14-Apr-1956 DOA: 02/24/2024 DOS: the patient was seen and examined on 02/24/2024 PCP: Pcp, No  Patient coming from: Home  Chief Complaint:  Chief Complaint  Patient presents with   Shortness of Breath   Chest Pain   HPI: Dillon [name] is a 68 y.o. male with medical history significant of chronic respiratory failure on 4-5 L, COPD, GERD, pulmonary fibrosis, restless leg syndrome and coronary artery disease presenting with acute on chronic respiratory failure with hypoxia and pneumothorax.  Patient reports acute onset of severe right-sided chest pain and shortness of breath earlier this morning.  Patient denies any recent trauma.  Patient states that he did have some similar pain on the posterior chest and right back last week associated with pulling shirt off with some excessive exertion per the patient.  Baseline pulmonary fibrosis and COPD.  No reported wheezing and cough.  No focal hemiparesis or confusion.  No abdominal pain.  Positive mild right-sided chest pain with deep breathing and movement.  Has been compliant otherwise with home respiratory regimen.  Follows Dr. Jamal Mays outpatient pulmonology. Presented to the ER afebrile, heart rate into the 100s, initially requiring BiPAP.  Noted moderate to large right-sided pneumothorax on chest x-ray.  Chest tube with pigtail catheter placed by ER physician.  With improvement in respiratory status back to 4 to 5 L nasal cannula with O2 sats in the mid to upper 90s.  White count 13.2, hemoglobin 15.7, platelets 192, positive decompensated respiratory acidosis initially on VBG.  Creatinine 0.75.  Glucose 185.  Troponin 13.  BNP within normal limits.  Follow-up chest x-ray with small to moderate pneumothorax. Review of Systems: As mentioned in the history of present illness. All other systems reviewed and are negative. Past Medical History:  Diagnosis Date   Chronic cough    COPD (chronic  obstructive pulmonary disease) (HCC)    Coronary artery disease    Edentulous    Elevated cholesterol    GERD (gastroesophageal reflux disease)    Myocardial infarction (HCC)    Pulmonary fibrosis (HCC)    Restless leg syndrome    Supplemental oxygen  dependent    Past Surgical History:  Procedure Laterality Date   ANGIOPLASTY  2010   2 stents placed   BACK SURGERY     cardiac stents     x2   CATARACT EXTRACTION W/PHACO Left 08/20/2023   Procedure: CATARACT EXTRACTION PHACO AND INTRAOCULAR LENS PLACEMENT (IOC) LEFT 16.87 01:21.8;  Surgeon: Trudi Fus, MD;  Location: [facility];  Service: Ophthalmology;  Laterality: Left;   CATARACT EXTRACTION W/PHACO Right 09/03/2023   Procedure: CATARACT EXTRACTION PHACO AND INTRAOCULAR LENS PLACEMENT (IOC) RIGHT 19.41 01:30.0;  Surgeon: Trudi Fus, MD;  Location: [facility];  Service: Ophthalmology;  Laterality: Right;   COLONOSCOPY     COLONOSCOPY WITH PROPOFOL  N/A 03/05/2018   Procedure: COLONOSCOPY WITH PROPOFOL ;  Surgeon: Irby Mannan, MD;  Location: [facility] ENDOSCOPY;  Service: Endoscopy;  Laterality: N/A;   COLONOSCOPY WITH PROPOFOL  N/A 01/24/2022   Procedure: COLONOSCOPY WITH PROPOFOL ;  Surgeon: Luke Salaam, MD;  Location: [facility] ENDOSCOPY;  Service: Gastroenterology;  Laterality: N/A;   CORONARY ANGIOPLASTY     CORONARY/GRAFT ACUTE MI REVASCULARIZATION N/A 09/30/2019   Procedure: Coronary/Graft Acute MI Revascularization;  Surgeon: Wenona Hamilton, MD;  Location: [facility] INVASIVE CV LAB;  Service: Cardiovascular;  Laterality: N/A;   LEFT HEART CATH AND CORONARY ANGIOGRAPHY N/A 09/30/2019   Procedure:  LEFT HEART CATH AND CORONARY ANGIOGRAPHY;  Surgeon: Wenona Hamilton, MD;  Location: [facility] INVASIVE CV LAB;  Service: Cardiovascular;  Laterality: N/A;   LEFT HEART CATH AND CORS/GRAFTS ANGIOGRAPHY N/A 10/01/2021   Procedure: LEFT HEART CATH AND CORS/GRAFTS ANGIOGRAPHY;  Surgeon: Sammy Crisp, MD;   Location: [facility] INVASIVE CV LAB;  Service: Cardiovascular;  Laterality: N/A;   Social History:  reports that he quit smoking about 12 years ago. His smoking use included cigarettes. He started smoking about 52 years ago. He has a 80 pack-year smoking history. He has never used smokeless tobacco. He reports that he does not currently use alcohol . He reports that he does not currently use drugs.  Allergies  Allergen Reactions   Ace Inhibitors Swelling    Causes cough and swelling   Cinnamon Rash    Mouth rash    Family History  Problem Relation Age of Onset   Heart disease Mother    Heart disease Father     Prior to Admission medications   Medication Sig Start Date End Date Taking? Authorizing Provider  acetaminophen  (TYLENOL ) 500 MG tablet Take 1,000 mg by mouth every 8 (eight) hours as needed for moderate pain.    [provider]  albuterol  (PROVENTIL ) (2.5 MG/3ML) 0.083% nebulizer solution SMARTSIG:3 Milliliter(s) Via Nebulizer Every 6 Hours PRN 11/02/23   [provider]  ALPHA LIPOIC ACID PO Take 500-1,000 mg by mouth daily.    [provider]  atorvastatin  (LIPITOR ) 40 MG tablet Take 1 tablet (40 mg total) by mouth daily. 01/12/24   Wenona Hamilton, MD  carvedilol  (COREG ) 6.25 MG tablet Take 1 tablet (6.25 mg total) by mouth 2 (two) times daily with a meal. 11/10/23   Dunn, Elvia Hammans, PA-C  clopidogrel  (PLAVIX ) 75 MG tablet TAKE 1/2 TABLET (37.5 MG TOTAL) BY MOUTH EVERY OTHER DAY. 11/20/23   Wenona Hamilton, MD  Fluticasone -Umeclidin-Vilant (TRELEGY ELLIPTA) 100-62.5-25 MCG/ACT AEPB Inhale 1 puff into the lungs daily. 10/22/22   Theron Flavin, MD  nitroGLYCERIN  (NITROSTAT ) 0.4 MG SL tablet Place 1 tablet (0.4 mg total) under the tongue every 5 (five) minutes x 3 doses as needed for chest pain. 11/10/23   Roark Chick, PA-C  OXYGEN  Inhale 4 L into the lungs continuous.    [provider]  pantoprazole  (PROTONIX ) 40 MG tablet Take 40 mg by mouth daily.  05/16/23   [provider]  Pirfenidone  (ESBRIET ) 267 MG CAPS Take 3 capsules by mouth 3 (three) times daily.    [provider]  predniSONE  (DELTASONE ) 20 MG tablet Take 20 mg by mouth daily. 11/02/23   [provider]  roflumilast  (DALIRESP ) 500 MCG TABS tablet Take 1 tablet by mouth daily. Every other day 06/10/23   [provider]  Treprostinil  (TYVASO  STARTER KIT) 0.6 MG/ML SOLN Inhale 18 mcg into the lungs 4 (four) times daily.    [provider]    Physical Exam: Vitals:   02/24/24 0715 02/24/24 0730 02/24/24 0745 02/24/24 0800  BP: (!) 151/93 136/84 124/80 122/74  Pulse: 95 87 89 87  Resp: (!) 30 (!) 23  (!) 28  Temp:      TempSrc:      SpO2: 91% 96% 97% 95%   Physical Exam Constitutional:      Appearance: He is normal weight.  HENT:     Head: Normocephalic and atraumatic.     Nose: Nose normal.     Mouth/Throat:     Mouth: Mucous membranes are moist.  Eyes:     Pupils: Pupils are equal, round, and reactive to light.  Cardiovascular:     Rate and Rhythm: Normal rate and regular rhythm.  Pulmonary:     Effort: Pulmonary effort is normal.     Comments: + decreased breath sounds on the R   Abdominal:     General: Bowel sounds are normal.  Musculoskeletal:        General: Normal range of motion.  Skin:    General: Skin is warm.  Neurological:     General: No focal deficit present.  Psychiatric:        Mood and Affect: Mood normal.     Data Reviewed:  There are no new results to review at this time.  DG Chest Portable 1 View CLINICAL DATA:  chest tube insertion.  EXAM: PORTABLE CHEST 1 VIEW  COMPARISON:  Radiograph from earlier the same day, 6:52 a.m.  FINDINGS: Since the prior study, there is interval placement of right-sided pleural drainage catheter. There is interval decrease in the pneumothorax however, small to moderate pneumothorax persist, with underlying collapsed lung, suggesting component of trapped  lung. Bilateral lungs appear hyperlucent with coarse bronchovascular markings, in keeping with COPD. Bilateral lungs otherwise appear clear. No dense consolidation or lung collapse. Bilateral costophrenic angles are clear.  Stable cardio-mediastinal silhouette.  No acute osseous abnormalities.  The soft tissues are within normal limits.  IMPRESSION: *Interval placement of right-sided pleural drainage catheter with interval decrease in the pneumothorax however, small to moderate pneumothorax persist, with underlying collapsed lung, suggesting component of trapped lung.  Electronically Signed   By: Beula Brunswick M.D.   On: 02/24/2024 08:10 DG Chest Portable 1 View CLINICAL DATA:  Shortness of breath, back and chest pain. History of COPD and pulmonary fibrosis.  EXAM: PORTABLE CHEST 1 VIEW  COMPARISON:  CT angio chest, abdomen and pelvis from 02/17/2024.  FINDINGS: There is a large right-sided pneumothorax greater than 50%. Stable cardiomediastinal contours. Diffuse reticular interstitial opacities, scarring and architectural distortion identified. No pleural fluid. No consolidative change. Visualized osseous structures appear grossly intact.  IMPRESSION: 1. Large right-sided pneumothorax greater than 50%. 2. Chronic interstitial lung disease.  Critical Value/emergent results were called by telephone at the time of interpretation on 02/24/2024 at 7:11 am to provider [facility] , who verbally acknowledged these results.  Electronically Signed   By: Kimberley Penman M.D.   On: 02/24/2024 07:11  Lab Results  Component Value Date   WBC 13.2 (H) 02/24/2024   HGB 15.7 02/24/2024   HCT 46.3 02/24/2024   MCV 104.5 (H) 02/24/2024   PLT 192 02/24/2024   Last metabolic panel Lab Results  Component Value Date   GLUCOSE 185 (H) 02/24/2024   NA 136 02/24/2024   K 4.0 02/24/2024   CL 101 02/24/2024   CO2 23 02/24/2024   BUN 17 02/24/2024   CREATININE 0.75 02/24/2024    GFRNONAA >60 02/24/2024   CALCIUM  9.0 02/24/2024   PHOS 3.2 10/28/2022   PROT 7.5 02/24/2024   ALBUMIN 3.7 02/24/2024   BILITOT 1.0 02/24/2024   ALKPHOS 59 02/24/2024   AST 30 02/24/2024   ALT 40 02/24/2024   ANIONGAP 12 02/24/2024    Assessment and Plan: Acute on chronic hypoxic respiratory failure (HCC) Pneumothorax Acute decompensated respiratory status initially requiring BiPAP  in the setting of spontaneous large pneumothorax on chest x-ray with chronic pulmonary fibrosis and COPD Status post chest tube and pigtail catheter in the ER Pulmonary consulting for further management  Does not appear to be in acute flare of COPD or pulmonary fibrosis at present Defer IV steroids for now-continue chronic prednisone  Continue supplemental oxygen  Follow-up pulmonary recommendations  Coronary artery disease due to lipid rich plaque Noted baseline history of CAD No active chest pain at present Troponin negative x 1 EKG sinus tach Continue home regimen including statin  Essential hypertension BP stable Titrate home regimen  Chronic heart failure with preserved ejection fraction (HFpEF) (HCC) 2D echo January 2025 with EF of 55 to 60% and grade 1 diastolic dysfunction Appears euvolemic at present Monitor volume status closely in the setting of recent pneumothorax Follow  GERD without esophagitis PPI  Pulmonary fibrosis (HCC) Noted baseline pulmonary fibrosis with COPD with overlapping acute pneumothorax today Appears fairly stable from a fibrosis and COPD standpoint at present Minimal wheezing Continue home regimen including prednisone  20 mg daily Consider stress dose steroids as appropriate in the setting of recent pneumothorax Follow-up pulmonology recommendations      Advance Care Planning:   Code Status: Prior   Consults: Pulmonology   Family Communication: Family at the bedside   Severity of Illness: The appropriate patient status for this patient is  OBSERVATION. Observation status is judged to be reasonable and necessary in order to provide the required intensity of service to ensure the patient's safety. The patient's presenting symptoms, physical exam findings, and initial radiographic and laboratory data in the context of their medical condition is felt to place them at decreased risk for further clinical deterioration. Furthermore, it is anticipated that the patient will be medically stable for discharge from the hospital within 2 midnights of admission.   Author: Corrinne Din, MD 02/24/2024 8:13 AM  For on call review [email or web address].

## 2024-02-24 NOTE — ED Provider Notes (Signed)
 [facility] Provider Note    Event Date/Time   First MD Initiated Contact with Patient 02/24/24 [phone]     (approximate)   History   Chief Complaint: Shortness of Breath and Chest Pain   HPI  Dillon [name] is a 68 y.o. male with a history of COPD on 5 L nasal cannula at all times who comes the ED due to sudden right-sided chest pain and shortness of breath that started this morning.  Severe.  EMS put the patient on CPAP, patient states this made him feel worse.Aaron Aas         Past Medical History:  Diagnosis Date   Chronic cough    COPD (chronic obstructive pulmonary disease) (HCC)    Coronary artery disease    Edentulous    Elevated cholesterol    GERD (gastroesophageal reflux disease)    Myocardial infarction (HCC)    Pulmonary fibrosis (HCC)    Restless leg syndrome    Supplemental oxygen  dependent     Current Outpatient Rx   Order #: [id number] Class: Historical Med   Order #: [id number] Class: Historical Med   Order #: [id number] Class: Historical Med   Order #: [id number] Class: Normal   Order #: [id number] Class: Normal   Order #: [id number] Class: Normal   Order #: [id number] Class: Normal   Order #: [id number] Class: Normal   Order #: [id number] Class: Historical Med   Order #: [id number] Class: Historical Med   Order #: [id number] Class: Historical Med   Order #: [id number] Class: Historical Med   Order #: [id number] Class: Historical Med   Order #: [id number] Class: Historical Med    Past Surgical History:  Procedure Laterality Date   ANGIOPLASTY  2010   2 stents placed   BACK SURGERY     cardiac stents     x2   CATARACT EXTRACTION W/PHACO Left 08/20/2023   Procedure: CATARACT EXTRACTION PHACO AND INTRAOCULAR LENS PLACEMENT (IOC) LEFT 16.87 01:21.8;  Surgeon: Trudi Fus, MD;  Location: [facility];  Service: Ophthalmology;  Laterality: Left;   CATARACT EXTRACTION W/PHACO Right 09/03/2023   Procedure: CATARACT EXTRACTION PHACO AND  INTRAOCULAR LENS PLACEMENT (IOC) RIGHT 19.41 01:30.0;  Surgeon: Trudi Fus, MD;  Location: [facility];  Service: Ophthalmology;  Laterality: Right;   COLONOSCOPY     COLONOSCOPY WITH PROPOFOL  N/A 03/05/2018   Procedure: COLONOSCOPY WITH PROPOFOL ;  Surgeon: Irby Mannan, MD;  Location: [facility] ENDOSCOPY;  Service: Endoscopy;  Laterality: N/A;   COLONOSCOPY WITH PROPOFOL  N/A 01/24/2022   Procedure: COLONOSCOPY WITH PROPOFOL ;  Surgeon: Luke Salaam, MD;  Location: [facility] ENDOSCOPY;  Service: Gastroenterology;  Laterality: N/A;   CORONARY ANGIOPLASTY     CORONARY/GRAFT ACUTE MI REVASCULARIZATION N/A 09/30/2019   Procedure: Coronary/Graft Acute MI Revascularization;  Surgeon: Wenona Hamilton, MD;  Location: [facility] INV[facility];  Service: Cardiovascular;  Laterality: N/A;   LEFT HEART CATH AND CORONARY ANGIOGRAPHY N/A 09/30/2019   Procedure: LEFT HEART CATH AND CORONARY ANGIOGRAPHY;  Surgeon: Wenona Hamilton, MD;  Location: [facility] IN[facility] CV [facility];  Service: Cardiovascular;  Laterality: N/A;   LEFT HEART CATH AND CORS/GRAFTS ANGIOGRAPHY N/A 10/01/2021   Procedure: LEFT HEART CATH AND CORS/GRAFTS ANGIOGRAPHY;  Surgeon: Sammy Crisp, MD;  Location: [facility] INV[facility] CV [facility];  Service: Cardiovascular;  Laterality: N/A;    Physical Exam   Triage Vital Signs: ED Triage Vitals  Encounter Vitals Group     BP 02/24/24 0702 (!) 165/128     Systolic BP Percentile --      Diastolic BP Percentile --  Pulse Rate 02/24/24 0644 (!) 109     Resp 02/24/24 0645 (!) 37     Temp 02/24/24 0651 97.6 F (36.4 C)     Temp Source 02/24/24 0651 Axillary     SpO2 02/24/24 0644 95 %     Weight --      Height --      Head Circumference --      Peak Flow --      Pain Score 02/24/24 0710 10     Pain Loc --      Pain Education --      Exclude from Growth Chart --     Most recent vital signs: Vitals:   02/24/24 0707 02/24/24 0715  BP:  (!) 151/93  Pulse: 97 95  Resp: (!) 37 (!) 30   Temp:    SpO2: (!) 74% 91%    General: Awake, moderate respiratory distress CV:  Good peripheral perfusion.  Regular rate and rhythm Resp:  Tachypnea.  Decreased breath sounds on the right.  No wheezing Abd:  No distention.  Soft nontender Other:  No lower extremity edema   ED Results / Procedures / Treatments   Labs (all labs ordered are listed, but only abnormal results are displayed) Labs Reviewed  CBC WITH DIFFERENTIAL/PLATELET - Abnormal; Notable for the following components:      Result Value   WBC 13.2 (*)    MCV 104.5 (*)    MCH 35.4 (*)    Lymphs Abs 4.6 (*)    Monocytes Absolute 1.3 (*)    Abs Immature Granulocytes 0.53 (*)    All other components within normal limits  COMPREHENSIVE METABOLIC PANEL WITH GFR - Abnormal; Notable for the following components:   Glucose, Bld 185 (*)    All other components within normal limits  BLOOD GAS, VENOUS - Abnormal; Notable for the following components:   pH, Ven 7.21 (*)    pCO2, Ven 61 (*)    pO2, Ven 67 (*)    Acid-base deficit 4.5 (*)    All other components within normal limits  BRAIN NATRIURETIC PEPTIDE  TROPONIN I (HIGH SENSITIVITY)     EKG Interpreted by me Sinus tachycardia rate 105.  Normal axis, normal intervals.  Normal QRS ST segments and T waves   RADIOLOGY Chest x-ray interpreted by me, shows large right-sided pneumothorax without signs of tension.  No subcutaneous emphysema.  Radiology report reviewed   PROCEDURES:  CHEST TUBE INSERTION  Date/Time: 02/24/2024 7:34 AM  Performed by: Jacquie Maudlin, MD Authorized by: Jacquie Maudlin, MD   Consent:    Consent obtained:  Emergent situation and verbal   Consent given by:  Patient   Risks discussed:  Bleeding, damage to surrounding structures, infection, nerve damage and pain   Alternatives discussed:  No treatment Universal protocol:    Patient identity confirmed:  Verbally with patient and arm band Pre-procedure details:    Skin  preparation:  Chlorhexidine  Sedation:    Sedation type:  None Anesthesia:    Anesthesia method:  Local infiltration   Local anesthetic:  Lidocaine  2% WITH epi Procedure details:    Placement location:  R lateral   Scalpel size:  11   Tube size (Fr):  16   Tube connected to:  Water  seal   Drainage characteristics:  Air only   Suture material:  0 silk   Dressing:  Xeroform gauze and 4x4 sterile gauze Post-procedure details:    Post-insertion x-ray findings: tube in good position  Procedure completion:  Tolerated well, no immediate complications Comments:        .Critical Care  Performed by: Jacquie Maudlin, MD Authorized by: Jacquie Maudlin, MD   Critical care provider statement:    Critical care time (minutes):  35   Critical care time was exclusive of:  Separately billable procedures and treating other patients   Critical care was necessary to treat or prevent imminent or life-threatening deterioration of the following conditions:  Circulatory failure and respiratory failure   Critical care was time spent personally by me on the following activities:  Development of treatment plan with patient or surrogate, discussions with consultants, evaluation of patient's response to treatment, examination of patient, obtaining history from patient or surrogate, ordering and performing treatments and interventions, ordering and review of laboratory studies, ordering and review of radiographic studies, pulse oximetry, re-evaluation of patient's condition and review of old charts   Care discussed with: admitting provider      MEDICATIONS ORDERED IN ED: Medications  lidocaine -EPINEPHrine  (XYLOCAINE  W/EPI) 2 %-1:100000 (with pres) injection 20 mL (has no administration in time range)  morphine  (PF) 4 MG/ML injection (has no administration in time range)  morphine  (PF) 4 MG/ML injection 4 mg (4 mg Intravenous Given 02/24/24 0710)     IMPRESSION / MDM / ASSESSMENT AND PLAN / ED COURSE   I reviewed the triage vital signs and the nursing notes.  DDx: Pneumothorax, pneumonia, NSTEMI, pleural effusion  Patient's presentation is most consistent with acute presentation with potential threat to life or bodily function.  Patient presents with sudden onset of chest pain and shortness of breath this morning, asymmetric lung exam.  Chest x-ray shows a large pneumothorax.  Pigtail chest tube catheter was placed emergently with immediate improvement in symptoms.  Initially allowed air to drain to waterseal only.  Discussed with pulmonology who agrees with hospitalist admission with pulmonology consult.  ----------------------------------------- 8:10 AM on 02/24/2024 ----------------------------------------- Chest tube placed to low intermittent suction.  Patient transitioned off nonrebreather to nasal cannula at 6 L, maintaining O2 sat of 88%.  Case discussed with hospitalist       FINAL CLINICAL IMPRESSION(S) / ED DIAGNOSES   Final diagnoses:  Primary spontaneous pneumothorax  Acute respiratory failure with hypoxia (HCC)     Rx / DC Orders   ED Discharge Orders     None        Note:  This document was prepared using Dragon voice recognition software and may include unintentional dictation errors.   Jacquie Maudlin, MD 02/24/24 [phone]

## 2024-02-24 NOTE — TOC CM/SW Note (Signed)
 TOC acknowledges consult.  PT/OT eval pending.

## 2024-02-24 NOTE — ED Triage Notes (Signed)
 Patient C/O SOB, back and chest pain that began about an hour ago. Patient has history of COPD and Pulmonary Fibrosis. Patient on 2L [place] at baseline. Brought in by EMS on CPAP and could only get sats up to 72%.

## 2024-02-24 NOTE — Evaluation (Signed)
 Physical Therapy Evaluation Patient Details Name: Dillon [name] MRN: [id number] DOB: 01/30/1956 Today's Date: 02/24/2024  History of Present Illness  68 y.o. male with medical history significant of chronic respiratory failure on 4-5 L, COPD, GERD, pulmonary fibrosis, restless leg syndrome and coronary artery disease presenting with acute on chronic respiratory failure with hypoxia and pneumothorax.  Patient reports acute onset of severe right-sided chest pain and shortness of breath earlier this morning.  Patient denies any recent trauma.  Patient states that he did have some similar pain on the posterior chest and right back last week associated with pulling shirt off with some excessive exertion per the patient.  Baseline pulmonary fibrosis and COPD.  No reported wheezing and cough.  No focal hemiparesis or confusion.  No abdominal pain.  Positive mild right-sided chest pain with deep breathing and movement.  Has been compliant otherwise with home respiratory regimen.  Follows Dr. Jamal Mays outpatient pulmonology.  Presented to the ER afebrile, heart rate into the 100s, initially requiring BiPAP.  Noted moderate to large right-sided pneumothorax on chest x-ray.  Chest tube with pigtail catheter placed by ER physician.  With improvement in respiratory status back to 4 to 5 L nasal cannula with O2 sats in the mid to upper 90s.  White count 13.2, hemoglobin 15.7, platelets 192, positive decompensated respiratory acidosis initially on VBG.  Creatinine 0.75.  Glucose 185.  Troponin 13.  BNP within normal limits.  Follow-up chest x-ray with small to moderate pneumothorax.  Clinical Impression  Pt clearly hurting in R chest/ribs but was willing to work with PT despite the pain.  He was able to maintain O2 in the mid 90s on 7L at rest but even with minimal bed mobility/sitting was low 90s. Pt did mobility/transfers w/o direct assist but clearly guarded and hesitant 2/2 pain as well as associated breathing  difficulty.  He did 20-30 ft with SpO2 remaining >88% but started to have precipitous drop in SpO2 down to 79% and needed ~3 minutes with 10 L to get back up to 90%; returned to 7L - discussed with RN/aware. Pt moved relatively well but is far from his baseline in multiple aspects, will benefit from continued PT to address functional and activity tolerance limitations.        If plan is discharge home, recommend the following: A little help with walking and/or transfers;A little help with bathing/dressing/bathroom;Assistance with cooking/housework;Assist for transportation;Help with stairs or ramp for entrance   Can travel by private vehicle   Yes    Equipment Recommendations  (TBD - has FFW vs 4WW?)  Recommendations for Other Services       Functional Status Assessment Patient has had a recent decline in their functional status and demonstrates the ability to make significant improvements in function in a reasonable and predictable amount of time.     Precautions / Restrictions Precautions Precautions: Fall Recall of Precautions/Restrictions: Intact Precaution/Restrictions Comments: chest tube Restrictions Weight Bearing Restrictions Per Provider Order: No      Mobility  Bed Mobility Overal bed mobility: Needs Assistance Bed Mobility: Supine to Sit, Sit to Supine     Supine to sit: Contact guard Sit to supine: Contact guard assist        Transfers Overall transfer level: Needs assistance Equipment used: 1 person hand held assist Transfers: Sit to/from Stand Sit to Stand: Contact guard assist           General transfer comment: Pt guarded 2/2 R rib/chest pain but able to rise  w/o assist or safety issues    Ambulation/Gait Ambulation/Gait assistance: Supervision Gait Distance (Feet): 50 Feet Assistive device: Rolling walker (2 wheels)         General Gait Details: Pt c/o pain and unable to take deep breaths on 7L O2.  SpO2 did remain in the low 90s for the  first 25 ft, but he clearly became more fatigued with increased effort and SpO2 dropped to ~80% with clear fatigue and discomfort.  Does not normally use RW, but clearly reliant on it today.  Stairs            Wheelchair Mobility     Tilt Bed    Modified Rankin (Stroke Patients Only)       Balance Overall balance assessment: Needs assistance Sitting-balance support: Feet supported Sitting balance-Leahy Scale: Good     Standing balance support: Reliant on assistive device for balance, During functional activity Standing balance-Leahy Scale: Fair                               Pertinent Vitals/Pain Pain Assessment Pain Assessment: 0-10 Pain Score: 7  Pain Location: R chest, increases with ambulation/deeper breathing    Home Living Family/patient expects to be discharged to:: Private residence Living Arrangements: Spouse/significant other Available Help at Discharge: Family;Available 24 hours/day Type of Home: House Home Access: Stairs to enter Entrance Stairs-Rails: Left Entrance Stairs-Number of Steps: 6 in back, 2 in front but front is father away   Home Layout: One level Home Equipment: Scientist, research (medical) (4 wheels) Additional Comments: 5Ls via [place] at baseline    Prior Function Prior Level of Function : Independent/Modified Independent;Working/employed;Driving             Mobility Comments: amb with no AD, intermittent use mwc for [place] distances ADLs Comments: Pt works part time as a Public house manager. Wife assists with cooking and cleaning. He is mod I - I with ADLs.     Extremity/Trunk Assessment   Upper Extremity Assessment Upper Extremity Assessment: Overall WFL for tasks assessed;Generalized weakness (limited R side strain/testing 2/2 pain)    Lower Extremity Assessment Lower Extremity Assessment: Overall WFL for tasks assessed       Communication   Communication Communication: No apparent  difficulties    Cognition Arousal: Alert Behavior During Therapy: WFL for tasks assessed/performed   PT - Cognitive impairments: No apparent impairments                         Following commands: Intact       Cueing Cueing Techniques: Verbal cues     General Comments General comments (skin integrity, edema, etc.): pleasant, good effort, limited activity tolerance    Exercises     Assessment/Plan    PT Assessment Patient needs continued PT services  PT Problem List Decreased activity tolerance;Decreased mobility;Decreased knowledge of use of DME;Decreased safety awareness;Cardiopulmonary status limiting activity;Pain       PT Treatment Interventions DME instruction;Gait training;Stair training;Functional mobility training;Therapeutic activities;Therapeutic exercise;Balance training;Patient/family education    PT Goals (Current goals can be found in the Care Plan section)  Acute Rehab PT Goals Patient Stated Goal: go home PT Goal Formulation: With patient/family Time For Goal Achievement: 03/08/24 Potential to Achieve Goals: Good    Frequency Min 2X/week     Co-evaluation               AM-PAC PT "6 Clicks"  Mobility  Outcome Measure Help needed turning from your back to your side while in a flat bed without using bedrails?: None Help needed moving from lying on your back to sitting on the side of a flat bed without using bedrails?: None Help needed moving to and from a bed to a chair (including a wheelchair)?: None Help needed standing up from a chair using your arms (e.g., wheelchair or bedside chair)?: None Help needed to walk in hospital room?: None Help needed climbing 3-5 steps with a railing? : A Little 6 Click Score: 23    End of Session Equipment Utilized During Treatment: Gait belt;Oxygen  (7L, bumped to 10 temorarily while recovering from ambulation effort) Activity Tolerance: Patient limited by fatigue;Patient limited by pain Patient  left: in bed;with call bell/phone within reach;with family/visitor present Nurse Communication: Mobility status (O2 with activity) PT Visit Diagnosis: Muscle weakness (generalized) (M62.81);Difficulty in walking, not elsewhere classified (R26.2)    Time: 1202-1231 PT Time Calculation (min) (ACUTE ONLY): 29 min   Charges:   PT Evaluation $PT Eval Low Complexity: 1 Low PT Treatments $Therapeutic Activity: 8-22 mins PT General Charges $$ ACUTE PT VISIT: 1 Visit         Darice Edelman, DPT 02/24/2024, 1:03 PM

## 2024-02-24 NOTE — Assessment & Plan Note (Signed)
 Noted baseline history of CAD No active chest pain at present Troponin negative x 1 EKG sinus tach Continue home regimen including statin

## 2024-02-24 NOTE — ED Notes (Signed)
 Post chest tube insertion, pt reporting relief and oxygen  increased to 96% on NRB.

## 2024-02-24 NOTE — Assessment & Plan Note (Signed)
 Noted baseline pulmonary fibrosis with COPD with overlapping acute pneumothorax today Appears fairly stable from a fibrosis and COPD standpoint at present Minimal wheezing Continue home regimen including prednisone  20 mg daily Consider stress dose steroids as appropriate in the setting of recent pneumothorax Follow-up pulmonology recommendations

## 2024-02-25 DIAGNOSIS — J939 Pneumothorax, unspecified: Secondary | ICD-10-CM | POA: Diagnosis not present

## 2024-02-25 LAB — COMPREHENSIVE METABOLIC PANEL WITH GFR
ALT: 37 U/L (ref 0–44)
AST: 29 U/L (ref 15–41)
Albumin: 3.4 g/dL — ABNORMAL LOW (ref 3.5–5.0)
Alkaline Phosphatase: 50 U/L (ref 38–126)
Anion gap: 7 (ref 5–15)
BUN: 17 mg/dL (ref 8–23)
CO2: 27 mmol/L (ref 22–32)
Calcium: 9 mg/dL (ref 8.9–10.3)
Chloride: 101 mmol/L (ref 98–111)
Creatinine, Ser: 0.62 mg/dL (ref 0.61–1.24)
GFR, Estimated: >60 mL/min (ref >60)
Glucose, Bld: 143 mg/dL — ABNORMAL HIGH (ref 70–99)
Potassium: 4.7 mmol/L (ref 3.5–5.1)
Sodium: 135 mmol/L (ref 135–145)
Total Bilirubin: 0.8 mg/dL (ref 0.0–1.2)
Total Protein: 6.5 g/dL (ref 6.5–8.1)

## 2024-02-25 LAB — TROPONIN I (HIGH SENSITIVITY)
Troponin I (High Sensitivity): 118 ng/L (ref <18)
Troponin I (High Sensitivity): 127 ng/L (ref <18)

## 2024-02-25 LAB — CBC
HCT: 41.2 % (ref 39.0–52.0)
Hemoglobin: 14.4 g/dL (ref 13.0–17.0)
MCH: 35.6 pg — ABNORMAL HIGH (ref 26.0–34.0)
MCHC: 35 g/dL (ref 30.0–36.0)
MCV: 101.7 fL — ABNORMAL HIGH (ref 80.0–100.0)
Platelets: 143 10*3/uL — ABNORMAL LOW (ref 150–400)
RBC: 4.05 MIL/uL — ABNORMAL LOW (ref 4.22–5.81)
RDW: 13.7 % (ref 11.5–15.5)
WBC: 12.7 10*3/uL — ABNORMAL HIGH (ref 4.0–10.5)
nRBC: 0 % (ref 0.0–0.2)

## 2024-02-25 LAB — GLUCOSE, CAPILLARY: Glucose-Capillary: 117 mg/dL — ABNORMAL HIGH (ref 70–99)

## 2024-02-25 MED ORDER — ROFLUMILAST 500 MCG PO TABS
500.0000 ug | ORAL_TABLET | Freq: Every day | ORAL | Status: DC
  Administered 2024-02-25 – 2024-03-08 (×13): 500 ug via ORAL
  Filled 2024-02-25 (×13): qty 1

## 2024-02-25 MED ORDER — PANTOPRAZOLE SODIUM 40 MG PO TBEC
40.0000 mg | DELAYED_RELEASE_TABLET | Freq: Every day | ORAL | Status: DC
  Administered 2024-02-25 – 2024-03-08 (×13): 40 mg via ORAL
  Filled 2024-02-25 (×13): qty 1

## 2024-02-25 MED ORDER — PIRFENIDONE 267 MG PO TABS
801.0000 mg | ORAL_TABLET | Freq: Three times a day (TID) | ORAL | Status: DC
  Administered 2024-02-26 – 2024-03-08 (×34): 801 mg via ORAL
  Filled 2024-02-25 (×35): qty 3

## 2024-02-25 MED ORDER — ATORVASTATIN CALCIUM 20 MG PO TABS
40.0000 mg | ORAL_TABLET | Freq: Every day | ORAL | Status: DC
  Administered 2024-02-25 – 2024-03-08 (×13): 40 mg via ORAL
  Filled 2024-02-25 (×13): qty 2

## 2024-02-25 MED ORDER — PREDNISONE 20 MG PO TABS
20.0000 mg | ORAL_TABLET | Freq: Every day | ORAL | Status: DC
  Administered 2024-02-25 – 2024-03-08 (×13): 20 mg via ORAL
  Filled 2024-02-25 (×13): qty 1

## 2024-02-25 MED ORDER — ACETAMINOPHEN 325 MG PO TABS
650.0000 mg | ORAL_TABLET | Freq: Four times a day (QID) | ORAL | Status: DC | PRN
  Administered 2024-02-25 – 2024-03-08 (×35): 650 mg via ORAL
  Filled 2024-02-25 (×36): qty 2

## 2024-02-25 MED ORDER — ALBUTEROL SULFATE (2.5 MG/3ML) 0.083% IN NEBU
2.5000 mg | INHALATION_SOLUTION | Freq: Two times a day (BID) | RESPIRATORY_TRACT | Status: DC
  Administered 2024-02-25 – 2024-03-08 (×25): 2.5 mg via RESPIRATORY_TRACT
  Filled 2024-02-25 (×26): qty 3

## 2024-02-25 MED ORDER — TREPROSTINIL 0.6 MG/ML IN SOLN
18.0000 ug | Freq: Four times a day (QID) | RESPIRATORY_TRACT | Status: DC
  Administered 2024-02-25 – 2024-03-05 (×23): 18 ug via RESPIRATORY_TRACT
  Filled 2024-02-25: qty 2.9

## 2024-02-25 MED ORDER — CARVEDILOL 6.25 MG PO TABS
6.2500 mg | ORAL_TABLET | Freq: Two times a day (BID) | ORAL | Status: DC
  Administered 2024-02-25 – 2024-03-08 (×24): 6.25 mg via ORAL
  Filled 2024-02-25 (×24): qty 1

## 2024-02-25 MED ORDER — BUDESON-GLYCOPYRROL-FORMOTEROL 160-9-4.8 MCG/ACT IN AERO
2.0000 | INHALATION_SPRAY | Freq: Two times a day (BID) | RESPIRATORY_TRACT | Status: DC
  Administered 2024-02-25 – 2024-03-08 (×23): 2 via RESPIRATORY_TRACT
  Filled 2024-02-25: qty 5.9

## 2024-02-25 MED ORDER — PIRFENIDONE 267 MG PO CAPS
801.0000 mg | ORAL_CAPSULE | Freq: Three times a day (TID) | ORAL | Status: DC
  Administered 2024-02-25 (×2): 801 mg via ORAL
  Filled 2024-02-25: qty 801

## 2024-02-25 NOTE — Significant Event (Signed)
       CROSS COVER NOTE  NAME: Dillon [name] MRN: [id number] DOB : [birth year] ATTENDING PHYSICIAN: Corrinne Din, MD    Date of Service   02/25/2024   HPI/Events of Note   Critical troponin of 140 reported from RN   Interventions   Assessment/Plan: Previous troponin 13 Patient admitted with acute on chronic resp failure with hypoxia and right sided pneumothorax s/p chest tube placement. Has relevant significant history of pulmonary fibrosis, COPD and baseline oxygen  need of 4-5 L Chest pain likely demand from pneumo EKG reviewed by me NSR  No ischemic changed Trend troponin Monitor on tele       Kip Peon NP [facility] Cross Cover 7pm-7am - check amion for availability Pager [phone]

## 2024-02-25 NOTE — Plan of Care (Signed)

## 2024-02-25 NOTE — Consult Note (Signed)
 PULMONOLOGY         Date: 02/25/2024,   MRN# [id number] Dillon [name] [facility] July 07, 1956     Admission                  Current   Referring provider: Dr Daisey Dryer   CHIEF COMPLAINT:   Pneumothorax   HISTORY OF PRESENT ILLNESS   Dillon [name] is a 68 y.o. male with medical history significant of chronic respiratory failure on 4-5 L, COPD, GERD, pulmonary fibrosis, restless leg syndrome and coronary artery disease presenting with acute on chronic respiratory failure with hypoxia and pneumothorax.  Patient reports acute onset of severe right-sided chest pain and shortness of breath earlier this morning.  He reports pain started when he was getting prepared for work on Tuesday and as he was taking shirt off to get into shower he felt severe pain suddenly. No focal hemiparesis or confusion.  No abdominal pain.  Positive mild right-sided chest pain with deep breathing and movement.  Has been compliant otherwise with home respiratory regimen.   Presented to the ER afebrile, heart rate into the 100s, initially requiring BiPAP.  Noted moderate to large right-sided pneumothorax on chest x-ray.  Chest tube with pigtail catheter placed by ER physician. Patient is now improved but still has some residual pain on right hemithorax. PCCM consultation for further management of secondary spontaneous pneumothorax.    PAST MEDICAL HISTORY   Past Medical History:  Diagnosis Date   Chronic cough    COPD (chronic obstructive pulmonary disease) (HCC)    Coronary artery disease    Edentulous    Elevated cholesterol    GERD (gastroesophageal reflux disease)    Myocardial infarction (HCC)    Pulmonary fibrosis (HCC)    Restless leg syndrome    Supplemental oxygen  dependent      SURGICAL HISTORY   Past Surgical History:  Procedure Laterality Date   ANGIOPLASTY  2010   2 stents placed   BACK SURGERY     cardiac stents     x2   CATARACT EXTRACTION W/PHACO Left 08/20/2023   Procedure: CATARACT  EXTRACTION PHACO AND INTRAOCULAR LENS PLACEMENT (IOC) LEFT 16.87 01:21.8;  Surgeon: Trudi Fus, MD;  Location: [facility];  Service: Ophthalmology;  Laterality: Left;   CATARACT EXTRACTION W/PHACO Right 09/03/2023   Procedure: CATARACT EXTRACTION PHACO AND INTRAOCULAR LENS PLACEMENT (IOC) RIGHT 19.41 01:30.0;  Surgeon: Trudi Fus, MD;  Location: [facility];  Service: Ophthalmology;  Laterality: Right;   COLONOSCOPY     COLONOSCOPY WITH PROPOFOL  N/A 03/05/2018   Procedure: COLONOSCOPY WITH PROPOFOL ;  Surgeon: Irby Mannan, MD;  Location: [facility] ENDOSCOPY;  Service: Endoscopy;  Laterality: N/A;   COLONOSCOPY WITH PROPOFOL  N/A 01/24/2022   Procedure: COLONOSCOPY WITH PROPOFOL ;  Surgeon: Luke Salaam, MD;  Location[facility] ENDOSCOPY;  Service: Gastroenterology;  Laterality: N/A;   CORONARY ANGIOPLASTY     CORONARY/GRAFT ACUTE MI REVASCULARIZATION N/A 09/30/2019   Procedure: Coronary/Graft Acute MI Revascularization;  Surgeon: Wenona Hamilton, MD;  Location: [facility] INVASIVE CV LAB;  Service: Cardiovascular;  Laterality: N/A;   LEFT HEART CATH AND CORONARY ANGIOGRAPHY N/A 09/30/2019   Procedure: LEFT HEART CATH AND CORONARY ANGIOGRAPHY;  Surgeon: Wenona Hamilton, MD;  Location: [facility] INVASIVE CV LAB;  Service: Cardiovascular;  Laterality: N/A;   LEFT HEART CATH AND CORS/GRAFTS ANGIOGRAPHY N/A 10/01/2021   Procedure: LEFT HEART CATH AND CORS/GRAFTS ANGIOGRAPHY;  Surgeon: Sammy Crisp, MD;  Location: [facility] INVASIVE CV LAB;  Service: Cardiovascular;  Laterality: N/A;     FAMILY HISTORY   Family History  Problem Relation Age of Onset   Heart disease Mother    Heart disease Father      SOCIAL HISTORY   Social History   Tobacco Use   Smoking status: Former    Current packs/day: 0.00    Average packs/day: 2.0 packs/day for 40.0 years (80.0 ttl pk-yrs)    Types: Cigarettes    Start date: 63    Quit date: 2013    Years since quitting: 12.3    Smokeless tobacco: Never  Vaping Use   Vaping status: Never Used  Substance Use Topics   Alcohol  use: Not Currently    Comment: occassionally - 1 beer/month or less   Drug use: Not Currently     MEDICATIONS    Home Medication:    Current Medication:  Current Facility-Administered Medications:    enoxaparin  (LOVENOX ) injection 40 mg, 40 mg, Subcutaneous, Q24H, Corrinne Din, MD, 40 mg at 02/24/24 2257   HYDROmorphone  (DILAUDID ) injection 1 mg, 1 mg, Intravenous, Q2H PRN, Corrinne Din, MD, 1 mg at 02/24/24 2258   ondansetron  (ZOFRAN ) tablet 4 mg, 4 mg, Oral, Q6H PRN **OR** ondansetron  (ZOFRAN ) injection 4 mg, 4 mg, Intravenous, Q6H PRN, Corrinne Din, MD, 4 mg at 02/24/24 2257   Pirfenidone  CAPS 801 mg, 801 mg, Oral, TID, Elisabeth Guild, NP    ALLERGIES   Ace inhibitors and Cinnamon     REVIEW OF SYSTEMS    Review of Systems:  Gen:  Denies  fever, sweats, chills weigh loss  HEENT: Denies blurred vision, double vision, ear pain, eye pain, hearing loss, nose bleeds, sore throat Cardiac:  No dizziness, chest pain or heaviness, chest tightness,edema Resp:   reports dyspnea chronically  Gi: Denies swallowing difficulty, stomach pain, nausea or vomiting, diarrhea, constipation, bowel incontinence Gu:  Denies bladder incontinence, burning urine Ext:   Denies Joint pain, stiffness or swelling Skin: Denies  skin rash, easy bruising or bleeding or hives Endoc:  Denies polyuria, polydipsia , polyphagia or weight change Psych:   Denies depression, insomnia or hallucinations   Other:  All other systems negative   VS: BP 118/80 (BP Location: Left Arm)   Pulse 69   Temp 98 F (36.7 C)   Resp 17   SpO2 94%      PHYSICAL EXAM    GENERAL:NAD, no fevers, chills, no weakness no fatigue HEAD: Normocephalic, atraumatic.  EYES: Pupils equal, round, reactive to light. Extraocular muscles intact. No scleral icterus.  MOUTH: Moist mucosal membrane. Dentition intact.  No abscess noted.  EAR, NOSE, THROAT: Clear without exudates. No external lesions.  NECK: Supple. No thyromegaly. No nodules. No JVD.  PULMONARY: decreased breath sounds with mild rhonchi worse at bases bilaterally.  CARDIOVASCULAR: S1 and S2. Regular rate and rhythm. No murmurs, rubs, or gallops. No edema. Pedal pulses 2+ bilaterally.  GASTROINTESTINAL: Soft, nontender, nondistended. No masses. Positive bowel sounds. No hepatosplenomegaly.  MUSCULOSKELETAL: No swelling, clubbing, or edema. Range of motion full in all extremities.  NEUROLOGIC: Cranial nerves II through XII are intact. No gross focal neurological deficits. Sensation intact. Reflexes intact.  SKIN: No ulceration, lesions, rashes, or cyanosis. Skin warm and dry. Turgor intact.  PSYCHIATRIC: Mood, affect within normal limits. The patient is awake, alert and oriented x 3. Insight, judgment intact.       IMAGING  Reviewed CXR and CT with patient and family at bedside  ASSESSMENT/PLAN   Secondary  spontaneous pneumothorax                - chest tube on right - mild to moderate pain and tenderness             - cxr with residual moderate pneumothorax remaining    - continue chest tube to negative 20 cm H20    -repeat CXR - chest tube management - +air leak   2. Advanced COPD           Continue nebulizer therapy          Continue abx  3. Bibasilar atelectasis             IS at bedside   4. Physical deconditioning       PT/OT when able             Thank you for allowing me to participate in the care of this patient.   Patient/Family are satisfied with care plan and all questions have been answered.    Provider disclosure: Patient with at least one acute or chronic illness or injury that poses a threat to life or bodily function and is being managed actively during this encounter.  All of the below services have been performed independently by signing provider:  review of prior documentation from internal and or  external health records.  Review of previous and current lab results.  Interview and comprehensive assessment during patient visit today. Review of current and previous chest radiographs/CT scans. Discussion of management and test interpretation with health care team and patient/family.   This document was prepared using Dragon voice recognition software and may include unintentional dictation errors.     Barclay Lennox, M.D.  Division of Pulmonary & Critical Care Medicine

## 2024-02-25 NOTE — Progress Notes (Signed)
 OT Cancellation Note  Patient Details Name: Dillon [name] MRN: [id number] DOB: [birth year]   Cancelled Treatment:    Reason Eval/Treat Not Completed: Other (comment). Discussed with RN - pt currently on 9L O2, awaiting respiratory treatment. Given high O2 requirements and breathing difficulties noted in last therapy session notes, OT will check back as able after breathing tx.    [facility] L. Evanne Matsunaga, OTR/L  02/25/24, 1:23 PM

## 2024-02-25 NOTE — Progress Notes (Signed)
 Progress Note    Dillon [name]  ZOX:[id number] DOB: Feb 15, 1956  DOA: 02/24/2024 PCP: Pcp, No      Brief Narrative:    Medical records reviewed and are as summarized below:  Dillon [name] is a 68 y.o. male with medical history significant for COPD, pulmonary fibrosis, chronic hypoxic respiratory failure on 5 L/min oxygen , restless leg syndrome, GERD, hypertension, CAD, who presented to the hospital with acute onset of right-sided chest pain and shortness of breath.  He was found to have large right sided pneumothorax complicated by acute on chronic hypoxic respiratory failure.  He initially required BiPAP in the ED for acute respiratory failure. Chest tube was inserted in the right pleural space in the emergency room on 02/24/2024.    Assessment/Plan:   Principal Problem:   Pneumothorax Active Problems:   Acute on chronic hypoxic respiratory failure (HCC)   Coronary artery disease due to lipid rich plaque   Essential hypertension   Pulmonary fibrosis (HCC)   GERD without esophagitis   Chronic heart failure with preserved ejection fraction (HFpEF) (HCC)    Right-sided pneumothorax: S/p right-sided chest tube placement on 02/24/2024.  Analgesics as needed for pain.  Follow-up with pulmonologist.   Acute on chronic hypoxic respiratory failure: He is requiring 9 L/min oxygen  via HFNC.  He uses 5 L/min oxygen  at baseline. Initially required BiPAP in the ED.   COPD, pulmonary fibrosis: Continue prednisone , treprostinil , pirfenidone , Roflumilast  and bronchodilators   Chronic HFpEF: Compensated   Troponins: Troponins peaked at 140.  This is likely from demand ischemia from respiratory failure.     Diet Order             Diet heart healthy/carb modified Room service appropriate? Yes; Fluid consistency: Thin  Diet effective now                            Consultants: Pulmonologist  Procedures: Right-sided chest tube placement in the ER on  02/24/2024    Medications:    albuterol   2.5 mg Nebulization BID   atorvastatin   40 mg Oral Daily   budeson-glycopyrrolate-formoterol   2 puff Inhalation BID   carvedilol   6.25 mg Oral BID WC   enoxaparin  (LOVENOX ) injection  40 mg Subcutaneous Q24H   pantoprazole   40 mg Oral Daily   Pirfenidone   801 mg Oral TID   predniSONE   20 mg Oral Daily   roflumilast   500 mcg Oral Daily   Treprostinil   18 mcg Inhalation QID   Continuous Infusions:   Anti-infectives (From admission, onward)    None              Family Communication/Anticipated D/C date and plan/Code Status   DVT prophylaxis: enoxaparin  (LOVENOX ) injection 40 mg Start: 02/24/24 2200     Code Status: Full Code  Family Communication: Plan discussed with his wife at the bedside Disposition Plan: Plan to discharge home   Status is: Inpatient Remains inpatient appropriate because: Right-sided pneumothorax       Subjective:   Interval events noted.  He complains of cough and right-sided pleuritic chest pain.  His wife is at the bedside.  Objective:    Vitals:   02/25/24 0304 02/25/24 0740 02/25/24 1149 02/25/24 1526  BP: 118/80 126/73 127/75 124/80  Pulse: 69 81 73 86  Resp: 17 18 20  (!) 22  Temp: 98 F (36.7 C) 98.1 F (36.7 C) 98.3 F (36.8 C) 99.3 F (37.4 C)  TempSrc:      SpO2: 94% 93% 97% 95%   No data found.   Intake/Output Summary (Last 24 hours) at 02/25/2024 1630 Last data filed at 02/25/2024 1300 Gross per 24 hour  Intake 478 ml  Output 250 ml  Net 228 ml   There were no vitals filed for this visit.  Exam:  GEN: NAD SKIN: Warm and dry EYES: No pallor or icterus ENT: MMM CV: RRR PULM: Basilar rales.  Chest tube in right lateral chest wall ABD: soft, ND, NT, +BS CNS: AAO x 3, non focal EXT: No edema or tenderness        Data Reviewed:   I have personally reviewed following labs and imaging studies:  Labs: Labs show the following:   Basic Metabolic  Panel: Recent Labs  Lab 02/24/24 0650 02/25/24 0217  NA 136 135  K 4.0 4.7  CL 101 101  CO2 23 27  GLUCOSE 185* 143*  BUN 17 17  CREATININE 0.75 0.62  CALCIUM  9.0 9.0   GFR Estimated Creatinine Clearance: 104.2 mL/min (by C-G formula based on SCr of 0.62 mg/dL). Liver Function Tests: Recent Labs  Lab 02/24/24 0650 02/25/24 0217  AST 30 29  ALT 40 37  ALKPHOS 59 50  BILITOT 1.0 0.8  PROT 7.5 6.5  ALBUMIN 3.7 3.4*   No results for input(s): "LIPASE", "AMYLASE" in the last 168 hours. No results for input(s): "AMMONIA" in the last 168 hours. Coagulation profile No results for input(s): "INR", "PROTIME" in the last 168 hours.  CBC: Recent Labs  Lab 02/24/24 0650 02/25/24 0217  WBC 13.2* 12.7*  NEUTROABS 6.6  --   HGB 15.7 14.4  HCT 46.3 41.2  MCV 104.5* 101.7*  PLT 192 143*   Cardiac Enzymes: No results for input(s): "CKTOTAL", "CKMB", "CKMBINDEX", "TROPONINI" in the last 168 hours. BNP (last 3 results) No results for input(s): "PROBNP" in the last 8760 hours. CBG: No results for input(s): "GLUCAP" in the last 168 hours. D-Dimer: No results for input(s): "DDIMER" in the last 72 hours. Hgb A1c: No results for input(s): "HGBA1C" in the last 72 hours. Lipid Profile: No results for input(s): "CHOL", "HDL", "LDLCALC", "TRIG", "CHOLHDL", "LDLDIRECT" in the last 72 hours. Thyroid function studies: No results for input(s): "TSH", "T4TOTAL", "T3FREE", "THYROIDAB" in the last 72 hours.  Invalid input(s): "FREET3" Anemia work up: No results for input(s): "VITAMINB12", "FOLATE", "FERRITIN", "TIBC", "IRON", "RETICCTPCT" in the last 72 hours. Sepsis Labs: Recent Labs  Lab 02/24/24 0650 02/25/24 0217  WBC 13.2* 12.7*    Microbiology No results found for this or any previous visit (from the past 240 hours).  Procedures and diagnostic studies:  DG Chest Portable 1 View Result Date: 02/24/2024 CLINICAL DATA:  chest tube insertion. EXAM: PORTABLE CHEST 1 VIEW  COMPARISON:  Radiograph from earlier the same day, 6:52 a.m. FINDINGS: Since the prior study, there is interval placement of right-sided pleural drainage catheter. There is interval decrease in the pneumothorax however, small to moderate pneumothorax persist, with underlying collapsed lung, suggesting component of trapped lung. Bilateral lungs appear hyperlucent with coarse bronchovascular markings, in keeping with COPD. Bilateral lungs otherwise appear clear. No dense consolidation or lung collapse. Bilateral costophrenic angles are clear. Stable cardio-mediastinal silhouette. No acute osseous abnormalities. The soft tissues are within normal limits. IMPRESSION: *Interval placement of right-sided pleural drainage catheter with interval decrease in the pneumothorax however, small to moderate pneumothorax persist, with underlying collapsed lung, suggesting component of trapped lung. Electronically Signed   By: Etta Heritage  Jackquelyn Mass M.D.   On: 02/24/2024 08:10   DG Chest Portable 1 View Result Date: 02/24/2024 CLINICAL DATA:  Shortness of breath, back and chest pain. History of COPD and pulmonary fibrosis. EXAM: PORTABLE CHEST 1 VIEW COMPARISON:  CT angio chest, abdomen and pelvis from 02/17/2024. FINDINGS: There is a large right-sided pneumothorax greater than 50%. Stable cardiomediastinal contours. Diffuse reticular interstitial opacities, scarring and architectural distortion identified. No pleural fluid. No consolidative change. Visualized osseous structures appear grossly intact. IMPRESSION: 1. Large right-sided pneumothorax greater than 50%. 2. Chronic interstitial lung disease. Critical Value/emergent results were called by telephone at the time of interpretation on 02/24/2024 at 7:11 am to provider [facility] , who verbally acknowledged these results. Electronically Signed   By: Kimberley Penman M.D.   On: 02/24/2024 07:11               LOS: 1 day   Dillon [name]  [facility]   Pager on  [email or web address]. If 7PM-7AM, please contact night-coverage at [email or web address]     02/25/2024, 4:30 PM

## 2024-02-26 ENCOUNTER — Inpatient Hospital Stay

## 2024-02-26 DIAGNOSIS — J939 Pneumothorax, unspecified: Secondary | ICD-10-CM | POA: Diagnosis not present

## 2024-02-26 NOTE — Progress Notes (Signed)
 Occupational Therapy Treatment Patient Details Name: Dillon [name] MRN: [id number] DOB: 02-04-1956 Today's Date: 02/26/2024   History of present illness 68 y.o. male with medical history significant of chronic respiratory failure on 4-5 L, COPD, GERD, pulmonary fibrosis, restless leg syndrome and coronary artery disease presenting with acute on chronic respiratory failure with hypoxia and pneumothorax.  Patient reports acute onset of severe right-sided chest pain and shortness of breath earlier this morning.  Patient denies any recent trauma.  Patient states that he did have some similar pain on the posterior chest and right back last week associated with pulling shirt off with some excessive exertion per the patient.  Baseline pulmonary fibrosis and COPD.  No reported wheezing and cough.  No focal hemiparesis or confusion.  No abdominal pain.  Positive mild right-sided chest pain with deep breathing and movement.  Has been compliant otherwise with home respiratory regimen.  Follows Dr. Jamal Mays outpatient pulmonology.  Presented to the ER afebrile, heart rate into the 100s, initially requiring BiPAP.  Noted moderate to large right-sided pneumothorax on chest x-ray.  Chest tube with pigtail catheter placed by ER physician.  With improvement in respiratory status back to 4 to 5 L nasal cannula with O2 sats in the mid to upper 90s.  White count 13.2, hemoglobin 15.7, platelets 192, positive decompensated respiratory acidosis initially on VBG.  Creatinine 0.75.  Glucose 185.  Troponin 13.  BNP within normal limits.  Follow-up chest x-ray with small to moderate pneumothorax.   OT comments  Pt seen for OT tx. Pt just transferred to the recliner with nursing and bumped from 9L to 5L just prior to OT's arrival. RN cleared to work with OT and requested to maintain SpO2 at 89% or better. Pt agreeable to session. Pt/family instructed in ECS including home/routines modifications, AE/DME, activity pacing, work  simplification, and risk of over exertion. OT facilitated problem solving for showering to improve safety/indep while supporting opportunity for prioritizing meaningful occupational engagement. Pt/family verbalized understanding. Pt demo'd independence with incentive spirometer with SpO2 89-90% while performing on 5L improving to 90-92% when at rest. RN notified. PTA in at end of session. Pt progressing well, continues to benefit.      If plan is discharge home, recommend the following:  A little help with walking and/or transfers;A little help with bathing/dressing/bathroom;Assistance with cooking/housework;Assist for transportation;Help with stairs or ramp for entrance   Equipment Recommendations  Other (comment) (consider a shower chair)    Recommendations for Other Services      Precautions / Restrictions Precautions Precautions: Fall Recall of Precautions/Restrictions: Intact Precaution/Restrictions Comments: chest tube Restrictions Weight Bearing Restrictions Per Provider Order: No       Mobility Bed Mobility               General bed mobility comments: NT, in recliner    Transfers                   General transfer comment: deferred, PTA in to work with pt     Balance Overall balance assessment: Needs assistance Sitting-balance support: Feet supported Sitting balance-Leahy Scale: Normal                                     ADL either performed or assessed with clinical judgement   ADL  Extremity/Trunk Assessment              Vision       Restaurant manager, fast food Communication: No apparent difficulties   Cognition Arousal: Alert Behavior During Therapy: WFL for tasks assessed/performed Cognition: No apparent impairments                               Following commands: Intact        Cueing   Cueing Techniques:  Verbal cues  Exercises Other Exercises Other Exercises: Pt/family instructed in ECS including home/routines modifications, AE/DME, activity pacing, work simplification, and risk of over exertion. OT facilitated problem solving for showering to improve safety/indep while supporting opportunity for prioritizing meaningful occupational engagement.    Shoulder Instructions       General Comments      Pertinent Vitals/ Pain       Pain Assessment Pain Assessment: No/denies pain  Home Living                                          Prior Functioning/Environment              Frequency  Min 3X/week        Progress Toward Goals  OT Goals(current goals can now be found in the care plan section)  Progress towards OT goals: Progressing toward goals  Acute Rehab OT Goals Patient Stated Goal: go home OT Goal Formulation: With patient/family Time For Goal Achievement: 03/09/24 Potential to Achieve Goals: Good  Plan      Co-evaluation                 AM-PAC OT "6 Clicks" Daily Activity     Outcome Measure   Help from another person eating meals?: None Help from another person taking care of personal grooming?: None Help from another person toileting, which includes using toliet, bedpan, or urinal?: A Little Help from another person bathing (including washing, rinsing, drying)?: A Little Help from another person to put on and taking off regular upper body clothing?: A Little Help from another person to put on and taking off regular lower body clothing?: A Little 6 Click Score: 20    End of Session Equipment Utilized During Treatment: Oxygen   OT Visit Diagnosis: Unsteadiness on feet (R26.81);Repeated falls (R29.6);Muscle weakness (generalized) (M62.81)   Activity Tolerance Patient tolerated treatment well   Patient Left in chair;with call bell/phone within reach;with family/visitor present;Other (comment) (PTA)   Nurse Communication Other  (comment) (O2)        Time: 9604-5409 OT Time Calculation (min): 15 min  Charges: OT General Charges $OT Visit: 1 Visit OT Treatments $Self Care/Home Management : 8-22 mins  Berenda Breaker., MPH, MS, OTR/L ascom [phone] 02/26/24, 4:43 PM

## 2024-02-26 NOTE — Plan of Care (Signed)

## 2024-02-26 NOTE — Progress Notes (Signed)
 Progress Note    Dillon [name]  ZOX:[id number] DOB: Mar 06, 1956  DOA: 02/24/2024 PCP: Pcp, No      Brief Narrative:    Medical records reviewed and are as summarized below:  Dillon [name] is a 68 y.o. male with medical history significant for COPD, pulmonary fibrosis, chronic hypoxic respiratory failure on 5 L/min oxygen , restless leg syndrome, GERD, hypertension, CAD, who presented to the hospital with acute onset of right-sided chest pain and shortness of breath.  He was found to have large right sided pneumothorax complicated by acute on chronic hypoxic respiratory failure.  He initially required BiPAP in the ED for acute respiratory failure. Chest tube was inserted in the right pleural space in the emergency room on 02/24/2024.    Assessment/Plan:   Principal Problem:   Pneumothorax Active Problems:   Acute on chronic hypoxic respiratory failure (HCC)   Coronary artery disease due to lipid rich plaque   Essential hypertension   Pulmonary fibrosis (HCC)   GERD without esophagitis   Chronic heart failure with preserved ejection fraction (HFpEF) (HCC)    Right-sided pneumothorax: Repeat chest x-ray on 03/08/2023 shows resolution of right pneumothorax.  S/p right-sided chest tube placement on 02/24/2024.  Analgesics as needed for pain.  Follow-up with pulmonologist.   Acute on chronic hypoxic respiratory failure: He is still on 9 L/min oxygen  via HFNC.  Wean down oxygen  to 5 L/min.  He uses 5 L/min oxygen  at baseline. Initially required BiPAP in the ED.   COPD, pulmonary fibrosis: Continue prednisone , treprostinil , pirfenidone , Roflumilast  and bronchodilators   Chronic HFpEF: Compensated   Troponins: Troponins peaked at 140.  This is likely from demand ischemia from respiratory failure.     Diet Order             Diet heart healthy/carb modified Room service appropriate? Yes; Fluid consistency: Thin  Diet effective now                             Consultants: Pulmonologist  Procedures: Right-sided chest tube placement in the ER on 02/24/2024    Medications:    albuterol   2.5 mg Nebulization BID   atorvastatin   40 mg Oral Daily   budeson-glycopyrrolate -formoterol   2 puff Inhalation BID   carvedilol   6.25 mg Oral BID WC   enoxaparin  (LOVENOX ) injection  40 mg Subcutaneous Q24H   pantoprazole   40 mg Oral Daily   Pirfenidone   801 mg Oral TID AC   predniSONE   20 mg Oral Daily   roflumilast   500 mcg Oral Daily   Treprostinil   18 mcg Inhalation QID   Continuous Infusions:   Anti-infectives (From admission, onward)    None              Family Communication/Anticipated D/C date and plan/Code Status   DVT prophylaxis: enoxaparin  (LOVENOX ) injection 40 mg Start: 02/24/24 2200     Code Status: Full Code  Family Communication: Plan discussed with his wife at the bedside Disposition Plan: Plan to discharge home   Status is: Inpatient Remains inpatient appropriate because: Right-sided pneumothorax       Subjective:   Interval events noted.  She complains of right-sided chest pain.  No shortness of breath.  Wife at the bedside.  Objective:    Vitals:   02/25/24 2354 02/26/24 0501 02/26/24 0754 02/26/24 1143  BP: 125/74 123/87 116/71 129/81  Pulse: 76 66 73 74  Resp: 20 19 20  12  Temp: 98.3 F (36.8 C) 98.2 F (36.8 C) 97.7 F (36.5 C) 98.6 F (37 C)  TempSrc:    Oral  SpO2: 96% 97% 93% 95%   No data found.   Intake/Output Summary (Last 24 hours) at 02/26/2024 1519 Last data filed at 02/26/2024 0857 Gross per 24 hour  Intake 240 ml  Output 750 ml  Net -510 ml   There were no vitals filed for this visit.  Exam:  GEN: NAD SKIN: Warm and dry EYES: No pallor or icterus ENT: MMM CV: RRR PULM: Bibasilar rales.  No wheezing.  Chest tube in right lateral chest wall ABD: soft, ND, NT, +BS CNS: AAO x 3, non focal EXT: No edema or tenderness           Data  Reviewed:   I have personally reviewed following labs and imaging studies:  Labs: Labs show the following:   Basic Metabolic Panel: Recent Labs  Lab 02/24/24 0650 02/25/24 0217  NA 136 135  K 4.0 4.7  CL 101 101  CO2 23 27  GLUCOSE 185* 143*  BUN 17 17  CREATININE 0.75 0.62  CALCIUM  9.0 9.0   GFR Estimated Creatinine Clearance: 104.2 mL/min (by C-G formula based on SCr of 0.62 mg/dL). Liver Function Tests: Recent Labs  Lab 02/24/24 0650 02/25/24 0217  AST 30 29  ALT 40 37  ALKPHOS 59 50  BILITOT 1.0 0.8  PROT 7.5 6.5  ALBUMIN 3.7 3.4*   No results for input(s): "LIPASE", "AMYLASE" in the last 168 hours. No results for input(s): "AMMONIA" in the last 168 hours. Coagulation profile No results for input(s): "INR", "PROTIME" in the last 168 hours.  CBC: Recent Labs  Lab 02/24/24 0650 02/25/24 0217  WBC 13.2* 12.7*  NEUTROABS 6.6  --   HGB 15.7 14.4  HCT 46.3 41.2  MCV 104.5* 101.7*  PLT 192 143*   Cardiac Enzymes: No results for input(s): "CKTOTAL", "CKMB", "CKMBINDEX", "TROPONINI" in the last 168 hours. BNP (last 3 results) No results for input(s): "PROBNP" in the last 8760 hours. CBG: Recent Labs  Lab 02/25/24 2154  GLUCAP 117*   D-Dimer: No results for input(s): "DDIMER" in the last 72 hours. Hgb A1c: No results for input(s): "HGBA1C" in the last 72 hours. Lipid Profile: No results for input(s): "CHOL", "HDL", "LDLCALC", "TRIG", "CHOLHDL", "LDLDIRECT" in the last 72 hours. Thyroid function studies: No results for input(s): "TSH", "T4TOTAL", "T3FREE", "THYROIDAB" in the last 72 hours.  Invalid input(s): "FREET3" Anemia work up: No results for input(s): "VITAMINB12", "FOLATE", "FERRITIN", "TIBC", "IRON", "RETICCTPCT" in the last 72 hours. Sepsis Labs: Recent Labs  Lab 02/24/24 0650 02/25/24 0217  WBC 13.2* 12.7*    Microbiology No results found for this or any previous visit (from the past 240 hours).  Procedures and diagnostic  studies:  DG Chest Port 1 View Result Date: 02/26/2024 CLINICAL DATA:  Pneumothorax on right. EXAM: PORTABLE CHEST 1 VIEW COMPARISON:  02/24/2024 FINDINGS: Again noted is a pigtail right chest tube along the lateral aspect of the right hemithorax. Right pneumothorax has resolved. Coarse interstitial lung markings are again noted and compatible with chronic changes. Trachea is midline. Heart size is normal. IMPRESSION: Resolved right pneumothorax. Right chest tube remains in place. Electronically Signed   By: Elene Griffes M.D.   On: 02/26/2024 10:53               LOS: 2 days   Zylie Mumaw  [facility]   Pager on [email or web address].  If 7PM-7AM, please contact night-coverage at [email or web address]     02/26/2024, 3:19 PM

## 2024-02-26 NOTE — Progress Notes (Signed)
 Pt continues to progress well. Currently weaned down to 5L O2 (baseline) 96% at rest, however desats quickly upon minimal exertion to 84%, ie. Standing activity, seated LE strengthening exercises. Pt recovers to 96% after 1 minute rest break and PLB technique. Will focus on assessing balance and equipment needs once chest tube removed or cleared to disconnect from wall suction.   02/26/24 1700  PT Visit Information  Assistance Needed +1  History of Present Illness 68 y.o. male with medical history significant of chronic respiratory failure on 4-5 L, COPD, GERD, pulmonary fibrosis, restless leg syndrome and coronary artery disease presenting with acute on chronic respiratory failure with hypoxia and pneumothorax.  Patient reports acute onset of severe right-sided chest pain and shortness of breath earlier this morning.  Patient denies any recent trauma.  Patient states that he did have some similar pain on the posterior chest and right back last week associated with pulling shirt off with some excessive exertion per the patient.  Baseline pulmonary fibrosis and COPD.  No reported wheezing and cough.  No focal hemiparesis or confusion.  No abdominal pain.  Positive mild right-sided chest pain with deep breathing and movement.  Has been compliant otherwise with home respiratory regimen.  Follows Dr. Jamal Mays outpatient pulmonology.  Presented to the ER afebrile, heart rate into the 100s, initially requiring BiPAP.  Noted moderate to large right-sided pneumothorax on chest x-ray.  Chest tube with pigtail catheter placed by ER physician.  With improvement in respiratory status back to 4 to 5 L nasal cannula with O2 sats in the mid to upper 90s.  White count 13.2, hemoglobin 15.7, platelets 192, positive decompensated respiratory acidosis initially on VBG.  Creatinine 0.75.  Glucose 185.  Troponin 13.  BNP within normal limits.  Follow-up chest x-ray with small to moderate pneumothorax.  Subjective Data  Patient  Stated Goal go home  Precautions  Precautions Fall  Recall of Precautions/Restrictions Intact  Precaution/Restrictions Comments Right chest tube to suction  Restrictions  Weight Bearing Restrictions Per Provider Order No  Pain Assessment  Pain Assessment Faces  Faces Pain Scale 2  Pain Location R chest, increases with ambulation/deeper breathing  Pain Descriptors / Indicators Discomfort;Guarding;Grimacing;Sore  Pain Intervention(s) Monitored during session  Cognition  Arousal Alert  Behavior During Therapy WFL for tasks assessed/performed  PT - Cognitive impairments No apparent impairments  Cueing  Cueing Techniques Verbal cues  Communication  Communication No apparent difficulties  Bed Mobility  General bed mobility comments NT, in recliner  Transfers  Overall transfer level Needs assistance  Equipment used 1 person hand held assist  Transfers Sit to/from Stand  Sit to Stand Contact guard assist  General transfer comment Pt able to stand from recliner on first attempt  Ambulation/Gait  Ambulation/Gait assistance Contact guard assist  Gait Distance (Feet) 5 Feet  Assistive device None  Gait Pattern/deviations Step-through pattern  General Gait Details Short distance gait forward and back due to chest tube remaining on wall suction. Slight unsteadiness, but no LOB. ? SPC for longer distances  Stairs Yes (Pt will need to practice stairs prior to d/c primarily for O2 monitoring)  Balance  Overall balance assessment Needs assistance  Sitting-balance support Feet supported  Sitting balance-Leahy Scale Normal  Standing balance support No upper extremity supported  Standing balance-Leahy Scale Fair  Standing balance comment Static standing and transfers without AD and no LOB, slightly unsteady with dynamic mobility  General Comments  General comments (skin integrity, edema, etc.) chest tube in place to  sx, pt on 5L O2 (baseline) quickly drops with minimal activity to 84%   Exercises  Exercises General Lower Extremity  General Exercises - Lower Extremity  Ankle Circles/Pumps AROM;Both;15 reps;Seated  Long Arc Quad AROM;Both;15 reps;Seated  Hip Flexion/Marching AROM;Both;10 reps;Seated  PT - End of Session  Equipment Utilized During Treatment Gait belt;Oxygen   Activity Tolerance Patient limited by fatigue  Patient left in chair;with call bell/phone within reach;with family/visitor present  Nurse Communication Mobility status   PT - Assessment/Plan  PT Visit Diagnosis Muscle weakness (generalized) (M62.81);Difficulty in walking, not elsewhere classified (R26.2)  PT Frequency (ACUTE ONLY) Min 2X/week  Follow Up Recommendations Home health PT  Can patient physically be transported by private vehicle Yes  Patient can return home with the following A little help with walking and/or transfers;A little help with bathing/dressing/bathroom;Assistance with cooking/housework;Assist for transportation;Help with stairs or ramp for entrance  PT equipment Other (comment) (? SPC, will continue to assess balance)  AM-PAC PT "6 Clicks" Mobility Outcome Measure (Version 2)  Help needed turning from your back to your side while in a flat bed without using bedrails? 4  Help needed moving from lying on your back to sitting on the side of a flat bed without using bedrails? 4  Help needed moving to and from a bed to a chair (including a wheelchair)? 4  Help needed standing up from a chair using your arms (e.g., wheelchair or bedside chair)? 4  Help needed to walk in hospital room? 3  Help needed climbing 3-5 steps with a railing?  3  6 Click Score 22  Consider Recommendation of Discharge To: Home with no services  Progressive Mobility  What is the highest level of mobility based on the progressive mobility assessment? Level 5 (Walks with assist in room/hall) - Balance while stepping forward/back and can walk in room with assist - Complete  Activity Ambulated with assistance in  room  PT Goal Progression  Progress towards PT goals Progressing toward goals  PT Time Calculation  PT Start Time (ACUTE ONLY) 1621  PT Stop Time (ACUTE ONLY) 1640  PT Time Calculation (min) (ACUTE ONLY) 19 min  PT General Charges  $$ ACUTE PT VISIT 1 Visit  PT Treatments  $Therapeutic Activity 8-22 mins  Melvyn Stagers, PTA 02/26/2024

## 2024-02-26 NOTE — Progress Notes (Signed)
 PULMONOLOGY         Date: 02/26/2024,   MRN# [id number] Dillon [name], Dillon [name]     Admission                  Current   Referring provider: Dr Daisey Dryer   CHIEF COMPLAINT:   Pneumothorax   HISTORY OF PRESENT ILLNESS   Dillon [name] is Dillon 68 y.o. male with medical history significant of chronic respiratory failure on 4-5 L, COPD, GERD, pulmonary fibrosis, restless leg syndrome and coronary artery disease presenting with acute on chronic respiratory failure with hypoxia and pneumothorax.  Patient reports acute onset of severe right-sided chest pain and shortness of breath earlier this morning.  He reports pain started when he was getting prepared for work on Tuesday and as he was taking shirt off to get into shower he felt severe pain suddenly. No focal hemiparesis or confusion.  No abdominal pain.  Positive mild right-sided chest pain with deep breathing and movement.  Has been compliant otherwise with home respiratory regimen.   Presented to the ER afebrile, heart rate into the 100s, initially requiring BiPAP.  Noted moderate to large right-sided pneumothorax on chest x-ray.  Chest tube with pigtail catheter placed by ER physician. Patient is now improved but still has some residual pain on right hemithorax. PCCM consultation for further management of secondary spontaneous pneumothorax.   02/26/24- patient with resolution of pneumothorax on cxr.  Will clamp tube overnight and repeat cxr in am for preparation to remove chest tube.    PAST MEDICAL HISTORY   Past Medical History:  Diagnosis Date   Chronic cough    COPD (chronic obstructive pulmonary disease) (HCC)    Coronary artery disease    Edentulous    Elevated cholesterol    GERD (gastroesophageal reflux disease)    Myocardial infarction (HCC)    Pulmonary fibrosis (HCC)    Restless leg syndrome    Supplemental oxygen  dependent      SURGICAL HISTORY   Past Surgical History:  Procedure Laterality Date    ANGIOPLASTY  2010   2 stents placed   BACK SURGERY     cardiac stents     x2   CATARACT EXTRACTION W/PHACO Left 08/20/2023   Procedure: CATARACT EXTRACTION PHACO AND INTRAOCULAR LENS PLACEMENT (IOC) LEFT 16.87 01:21.8;  Surgeon: Trudi Fus, MD;  Location: [name];  Service: Ophthalmology;  Laterality: Left;   CATARACT EXTRACTION W/PHACO Right 09/03/2023   Procedure: CATARACT EXTRACTION PHACO AND INTRAOCULAR LENS PLACEMENT (IOC) RIGHT 19.41 01:30.0;  Surgeon: Trudi Fus, MD;  Location: [facility];  Service: Ophthalmology;  Laterality: Right;   COLONOSCOPY     COLONOSCOPY WITH PROPOFOL  N/Dillon 03/05/2018   Procedure: COLONOSCOPY WITH PROPOFOL ;  Surgeon: Irby Mannan, MD;  Location: [facility];  Service: Endoscopy;  Laterality: N/Dillon;   COLONOSCOPY WITH PROPOFOL  N/Dillon 01/24/2022   Procedure: COLONOSCOPY WITH PROPOFOL ;  Surgeon: Luke Salaam, MD;  Location: [name];  Service: Gastroenterology;  Laterality: N/Dillon;   CORONARY ANGIOPLASTY     CORONARY/GRAFT ACUTE MI REVASCULARIZATION N/Dillon 09/30/2019   Procedure: Coronary/Graft Acute MI Revascularization;  Surgeon: Wenona Hamilton, MD;  Location: [facility];  Service: Cardiovascular;  Laterality: N/Dillon;   LEFT HEART CATH AND CORONARY ANGIOGRAPHY N/Dillon 09/30/2019   Procedure: LEFT HEART CATH AND CORONARY ANGIOGRAPHY;  Surgeon: Wenona Hamilton, MD;  Location: [facility];  Service: Cardiovascular;  Laterality: N/Dillon;   LEFT HEART CATH  AND CORS/GRAFTS ANGIOGRAPHY N/Dillon 10/01/2021   Procedure: LEFT HEART CATH AND CORS/GRAFTS ANGIOGRAPHY;  Surgeon: Sammy Crisp, MD;  Location: [facility];  Service: Cardiovascular;  Laterality: N/Dillon;     FAMILY HISTORY   Family History  Problem Relation Age of Onset   Heart disease Mother    Heart disease Father      SOCIAL HISTORY   Social History   Tobacco Use   Smoking status: Former    Current packs/day: 0.00    Average  packs/day: 2.0 packs/day for 40.0 years (80.0 ttl pk-yrs)    Types: Cigarettes    Start date: 40    Quit date: 2013    Years since quitting: 12.3   Smokeless tobacco: Never  Vaping Use   Vaping status: Never Used  Substance Use Topics   Alcohol  use: Not Currently    Comment: occassionally - 1 beer/month or less   Drug use: Not Currently     MEDICATIONS    Home Medication:    Current Medication:  Current Facility-Administered Medications:    acetaminophen  (TYLENOL ) tablet 650 mg, 650 mg, Oral, Q6H PRN, Sheril Dines, MD, 650 mg at 02/25/24 2007   albuterol  (PROVENTIL ) (2.5 MG/3ML) 0.083% nebulizer solution 2.5 mg, 2.5 mg, Nebulization, BID, Sheril Dines, MD, 2.5 mg at 02/25/24 1925   atorvastatin  (LIPITOR ) tablet 40 mg, 40 mg, Oral, Daily, Sheril Dines, MD, 40 mg at 02/25/24 0932   budeson-glycopyrrolate -formoterol  (BREZTRI ) 160-9-4.8 MCG/ACT inhaler 2 puff, 2 puff, Inhalation, BID, Sheril Dines, MD, 2 puff at 02/25/24 0954   carvedilol  (COREG ) tablet 6.25 mg, 6.25 mg, Oral, BID WC, Sheril Dines, MD, 6.25 mg at 02/25/24 1712   enoxaparin  (LOVENOX ) injection 40 mg, 40 mg, Subcutaneous, Q24H, Corrinne Din, MD, 40 mg at 02/25/24 2125   HYDROmorphone  (DILAUDID ) injection 1 mg, 1 mg, Intravenous, Q2H PRN, Corrinne Din, MD, 1 mg at 02/24/24 2258   ondansetron  (ZOFRAN ) tablet 4 mg, 4 mg, Oral, Q6H PRN **OR** ondansetron  (ZOFRAN ) injection 4 mg, 4 mg, Intravenous, Q6H PRN, Corrinne Din, MD, 4 mg at 02/24/24 2257   pantoprazole  (PROTONIX ) EC tablet 40 mg, 40 mg, Oral, Daily, Sheril Dines, MD, 40 mg at 02/25/24 0932   Pirfenidone  TABS 801 mg, 801 mg, Oral, TID AC, Ayiku, Bernard, MD   predniSONE  (DELTASONE ) tablet 20 mg, 20 mg, Oral, Daily, Sheril Dines, MD, 20 mg at 02/25/24 0932   roflumilast  (DALIRESP ) tablet 500 mcg, 500 mcg, Oral, Daily, Sheril Dines, MD, 500 mcg at 02/25/24 1914   Treprostinil  (TYVASO ) inhalation solution 18 mcg, 18 mcg, Inhalation, QID,  Sheril Dines, MD, 18 mcg at 02/25/24 2008    ALLERGIES   Ace inhibitors and Cinnamon     REVIEW OF SYSTEMS    Review of Systems:  Gen:  Denies  fever, sweats, chills weigh loss  HEENT: Denies blurred vision, double vision, ear pain, eye pain, hearing loss, nose bleeds, sore throat Cardiac:  No dizziness, chest pain or heaviness, chest tightness,edema Resp:   reports dyspnea chronically  Gi: Denies swallowing difficulty, stomach pain, nausea or vomiting, diarrhea, constipation, bowel incontinence Gu:  Denies bladder incontinence, burning urine Ext:   Denies Joint pain, stiffness or swelling Skin: Denies  skin rash, easy bruising or bleeding or hives Endoc:  Denies polyuria, polydipsia , polyphagia or weight change Psych:   Denies depression, insomnia or hallucinations   Other:  All other systems negative   VS: BP 123/87 (BP Location: Left Arm)   Pulse 66   Temp  98.2 F (36.8 C)   Resp 19   SpO2 97%      PHYSICAL EXAM    GENERAL:NAD, no fevers, chills, no weakness no fatigue HEAD: Normocephalic, atraumatic.  EYES: Pupils equal, round, reactive to light. Extraocular muscles intact. No scleral icterus.  MOUTH: Moist mucosal membrane. Dentition intact. No abscess noted.  EAR, NOSE, THROAT: Clear without exudates. No external lesions.  NECK: Supple. No thyromegaly. No nodules. No JVD.  PULMONARY: decreased breath sounds with mild rhonchi worse at bases bilaterally.  CARDIOVASCULAR: S1 and S2. Regular rate and rhythm. No murmurs, rubs, or gallops. No edema. Pedal pulses 2+ bilaterally.  GASTROINTESTINAL: Soft, nontender, nondistended. No masses. Positive bowel sounds. No hepatosplenomegaly.  MUSCULOSKELETAL: No swelling, clubbing, or edema. Range of motion full in all extremities.  NEUROLOGIC: Cranial nerves II through XII are intact. No gross focal neurological deficits. Sensation intact. Reflexes intact.  SKIN: No ulceration, lesions, rashes, or cyanosis. Skin warm  and dry. Turgor intact.  PSYCHIATRIC: Mood, affect within normal limits. The patient is awake, alert and oriented x 3. Insight, judgment intact.       IMAGING  Reviewed CXR and CT with patient and family at bedside  ASSESSMENT/PLAN   Secondary spontaneous pneumothorax                - chest tube on right - mild to moderate pain and tenderness             - cxr with residual moderate pneumothorax remaining    - continue chest tube to negative 20 cm H20    -repeat CXR - chest tube management - +air leak   2. Advanced COPD           Continue nebulizer therapy          Continue abx  3. Bibasilar atelectasis             IS at bedside   4. Physical deconditioning       PT/OT when able             Thank you for allowing me to participate in the care of this patient.   Patient/Family are satisfied with care plan and all questions have been answered.    Provider disclosure: Patient with at least one acute or chronic illness or injury that poses Dillon threat to life or bodily function and is being managed actively during this encounter.  All of the below services have been performed independently by signing provider:  review of prior documentation from internal and or external health records.  Review of previous and current lab results.  Interview and comprehensive assessment during patient visit today. Review of current and previous chest radiographs/CT scans. Discussion of management and test interpretation with health care team and patient/family.   This document was prepared using Dragon voice recognition software and may include unintentional dictation errors.     Dushawn Pusey, M.D.  Division of Pulmonary & Critical Care Medicine

## 2024-02-26 NOTE — Plan of Care (Signed)
   Problem: Education: Goal: Knowledge of General Education information will improve Description Including pain rating scale, medication(s)/side effects and non-pharmacologic comfort measures Outcome: Progressing   Problem: Health Behavior/Discharge Planning: Goal: Ability to manage health-related needs will improve Outcome: Progressing

## 2024-02-27 DIAGNOSIS — J939 Pneumothorax, unspecified: Secondary | ICD-10-CM | POA: Diagnosis not present

## 2024-02-27 NOTE — Progress Notes (Signed)
 PULMONOLOGY         Date: 02/27/2024,   MRN# [id number] Dillon [name] [facility] May 22, 1956     Admission                  Current   Referring provider: Dr Daisey Dryer   CHIEF COMPLAINT:   Pneumothorax   HISTORY OF PRESENT ILLNESS   [name] is a 68 y.o. male with medical history significant of chronic respiratory failure on 4-5 L, COPD, GERD, pulmonary fibrosis, restless leg syndrome and coronary artery disease presenting with acute on chronic respiratory failure with hypoxia and pneumothorax.  Patient reports acute onset of severe right-sided chest pain and shortness of breath earlier this morning.  He reports pain started when he was getting prepared for work on Tuesday and as he was taking shirt off to get into shower he felt severe pain suddenly. No focal hemiparesis or confusion.  No abdominal pain.  Positive mild right-sided chest pain with deep breathing and movement.  Has been compliant otherwise with home respiratory regimen.   Presented to the ER afebrile, heart rate into the 100s, initially requiring BiPAP.  Noted moderate to large right-sided pneumothorax on chest x-ray.  Chest tube with pigtail catheter placed by ER physician. Patient is now improved but still has some residual pain on right hemithorax. PCCM consultation for further management of secondary spontaneous pneumothorax.   02/26/24- patient with resolution of pneumothorax on cxr.  Will clamp tube overnight and repeat cxr in am for preparation to remove chest tube.     02/27/24- patient with no overnight events.  I have clamped chest tube and we will repeat cxr in am in preparation for chest tube removal.   PAST MEDICAL HISTORY   Past Medical History:  Diagnosis Date   Chronic cough    COPD (chronic obstructive pulmonary disease) (HCC)    Coronary artery disease    Edentulous    Elevated cholesterol    GERD (gastroesophageal reflux disease)    Myocardial infarction (HCC)    Pulmonary fibrosis (HCC)     Restless leg syndrome    Supplemental oxygen  dependent      SURGICAL HISTORY   Past Surgical History:  Procedure Laterality Date   ANGIOPLASTY  2010   2 stents placed   BACK SURGERY     cardiac stents     x2   CATARACT EXTRACTION W/PHACO Left 08/20/2023   Procedure: CATARACT EXTRACTION PHACO AND INTRAOCULAR LENS PLACEMENT (IOC) LEFT 16.87 01:21.8;  Surgeon: Trudi Fus, MD;  Location: [facility];  Service: Ophthalmology;  Laterality: Left;   CATARACT EXTRACTION W/PHACO Right 09/03/2023   Procedure: CATARACT EXTRACTION PHACO AND INTRAOCULAR LENS PLACEMENT (IOC) RIGHT 19.41 01:30.0;  Surgeon: Trudi Fus, MD;  Location: [facility];  Service: Ophthalmology;  Laterality: Right;   COLONOSCOPY     COLONOSCOPY WITH PROPOFOL  N/A 03/05/2018   Procedure: COLONOSCOPY WITH PROPOFOL ;  Surgeon: Irby Mannan, MD;  Location: [facility] ENDOSCOPY;  Service: Endoscopy;  Laterality: N/A;   COLONOSCOPY WITH PROPOFOL  N/A 01/24/2022   Procedure: COLONOSCOPY WITH PROPOFOL ;  Surgeon: Luke Salaam, MD;  Location: [facility] ENDOSCOPY;  Service: Gastroenterology;  Laterality: N/A;   CORONARY ANGIOPLASTY     CORONARY/GRAFT ACUTE MI REVASCULARIZATION N/A 09/30/2019   Procedure: Coronary/Graft Acute MI Revascularization;  Surgeon: Wenona Hamilton, MD;  Location: [facility] INVASIVE CV LAB;  Service: Cardiovascular;  Laterality: N/A;   LEFT HEART CATH AND CORONARY ANGIOGRAPHY N/A 09/30/2019   Procedure: LEFT  HEART CATH AND CORONARY ANGIOGRAPHY;  Surgeon: Wenona Hamilton, MD;  Location: [facility] INVASIVE CV LAB;  Service: Cardiovascular;  Laterality: N/A;   LEFT HEART CATH AND CORS/GRAFTS ANGIOGRAPHY N/A 10/01/2021   Procedure: LEFT HEART CATH AND CORS/GRAFTS ANGIOGRAPHY;  Surgeon: Sammy Crisp, MD;  Location: [facility] INVAS[facility] CV LAB;  Service: Cardiovascular;  Laterality: N/A;     FAMILY HISTORY   Family History  Problem Relation Age of Onset   Heart disease Mother    Heart  disease Father      SOCIAL HISTORY   Social History   Tobacco Use   Smoking status: Former    Current packs/day: 0.00    Average packs/day: 2.0 packs/day for 40.0 years (80.0 ttl pk-yrs)    Types: Cigarettes    Start date: 13    Quit date: 2013    Years since quitting: 12.3   Smokeless tobacco: Never  Vaping Use   Vaping status: Never Used  Substance Use Topics   Alcohol  use: Not Currently    Comment: occassionally - 1 beer/month or less   Drug use: Not Currently     MEDICATIONS    Home Medication:    Current Medication:  Current Facility-Administered Medications:    acetaminophen  (TYLENOL ) tablet 650 mg, 650 mg, Oral, Q6H PRN, Sheril Dines, MD, 650 mg at 02/27/24 1733   albuterol  (PROVENTIL ) (2.5 MG/3ML) 0.083% nebulizer solution 2.5 mg, 2.5 mg, Nebulization, BID, Sheril Dines, MD, 2.5 mg at 02/27/24 1726   atorvastatin  (LIPITOR ) tablet 40 mg, 40 mg, Oral, Daily, Sheril Dines, MD, 40 mg at 02/27/24 1191   budeson-glycopyrrolate -formoterol  (BREZTRI ) 160-9-4.8 MCG/ACT inhaler 2 puff, 2 puff, Inhalation, BID, Sheril Dines, MD, 2 puff at 02/27/24 4782   carvedilol  (COREG ) tablet 6.25 mg, 6.25 mg, Oral, BID WC, Sheril Dines, MD, 6.25 mg at 02/27/24 1742   enoxaparin  (LOVENOX ) injection 40 mg, 40 mg, Subcutaneous, Q24H, Corrinne Din, MD, 40 mg at 02/26/24 2131   HYDROmorphone  (DILAUDID ) injection 1 mg, 1 mg, Intravenous, Q2H PRN, Corrinne Din, MD, 1 mg at 02/24/24 2258   ondansetron  (ZOFRAN ) tablet 4 mg, 4 mg, Oral, Q6H PRN **OR** ondansetron  (ZOFRAN ) injection 4 mg, 4 mg, Intravenous, Q6H PRN, Corrinne Din, MD, 4 mg at 02/24/24 2257   pantoprazole  (PROTONIX ) EC tablet 40 mg, 40 mg, Oral, Daily, Sheril Dines, MD, 40 mg at 02/27/24 9562   Pirfenidone  TABS 801 mg, 801 mg, Oral, TID AC, Ayiku, Bernard, MD, 801 mg at 02/27/24 1742   predniSONE  (DELTASONE ) tablet 20 mg, 20 mg, Oral, Daily, Sheril Dines, MD, 20 mg at 02/27/24 1308   roflumilast  (DALIRESP )  tablet 500 mcg, 500 mcg, Oral, Daily, Sheril Dines, MD, 500 mcg at 02/27/24 6578   Treprostinil  (TYVASO ) inhalation solution 18 mcg, 18 mcg, Inhalation, QID, Sheril Dines, MD, 18 mcg at 02/27/24 1730    ALLERGIES   Ace inhibitors and Cinnamon     REVIEW OF SYSTEMS    Review of Systems:  Gen:  Denies  fever, sweats, chills weigh loss  HEENT: Denies blurred vision, double vision, ear pain, eye pain, hearing loss, nose bleeds, sore throat Cardiac:  No dizziness, chest pain or heaviness, chest tightness,edema Resp:   reports dyspnea chronically  Gi: Denies swallowing difficulty, stomach pain, nausea or vomiting, diarrhea, constipation, bowel incontinence Gu:  Denies bladder incontinence, burning urine Ext:   Denies Joint pain, stiffness or swelling Skin: Denies  skin rash, easy bruising or bleeding or hives Endoc:  Denies polyuria, polydipsia , polyphagia or weight  change Psych:   Denies depression, insomnia or hallucinations   Other:  All other systems negative   VS: BP 137/83 (BP Location: Left Arm)   Pulse 77   Temp 98.2 F (36.8 C) (Oral)   Resp 16   SpO2 92%      PHYSICAL EXAM    GENERAL:NAD, no fevers, chills, no weakness no fatigue HEAD: Normocephalic, atraumatic.  EYES: Pupils equal, round, reactive to light. Extraocular muscles intact. No scleral icterus.  MOUTH: Moist mucosal membrane. Dentition intact. No abscess noted.  EAR, NOSE, THROAT: Clear without exudates. No external lesions.  NECK: Supple. No thyromegaly. No nodules. No JVD.  PULMONARY: decreased breath sounds with mild rhonchi worse at bases bilaterally.  CARDIOVASCULAR: S1 and S2. Regular rate and rhythm. No murmurs, rubs, or gallops. No edema. Pedal pulses 2+ bilaterally.  GASTROINTESTINAL: Soft, nontender, nondistended. No masses. Positive bowel sounds. No hepatosplenomegaly.  MUSCULOSKELETAL: No swelling, clubbing, or edema. Range of motion full in all extremities.  NEUROLOGIC: Cranial  nerves II through XII are intact. No gross focal neurological deficits. Sensation intact. Reflexes intact.  SKIN: No ulceration, lesions, rashes, or cyanosis. Skin warm and dry. Turgor intact.  PSYCHIATRIC: Mood, affect within normal limits. The patient is awake, alert and oriented x 3. Insight, judgment intact.       IMAGING  Reviewed CXR and CT with patient and family at bedside  ASSESSMENT/PLAN   Secondary spontaneous pneumothorax                - chest tube on right - mild to moderate pain and tenderness             - cxr with residual moderate pneumothorax remaining    - continue chest tube to negative 20 cm H20    -repeat CXR - chest tube management - +air leak   2. Advanced COPD           Continue nebulizer therapy          Continue abx  3. Bibasilar atelectasis             IS at bedside   4. Physical deconditioning       PT/OT when able             Thank you for allowing me to participate in the care of this patient.   Patient/Family are satisfied with care plan and all questions have been answered.    Provider disclosure: Patient with at least one acute or chronic illness or injury that poses a threat to life or bodily function and is being managed actively during this encounter.  All of the below services have been performed independently by signing provider:  review of prior documentation from internal and or external health records.  Review of previous and current lab results.  Interview and comprehensive assessment during patient visit today. Review of current and previous chest radiographs/CT scans. Discussion of management and test interpretation with health care team and patient/family.   This document was prepared using Dragon voice recognition software and may include unintentional dictation errors.     Kristopher Attwood, M.D.  Division of Pulmonary & Critical Care Medicine

## 2024-02-27 NOTE — TOC Progression Note (Signed)
 Transition of Care [facility]) - Progression Note    Patient Details  Name: Dillon [name] MRN: [id number] Date of Birth: 07-10-1956  Transition of [facility]) CM/SW Contact  Holland Lundborg, RN Phone Number: 02/27/2024, 3:51 PM  Clinical Narrative:     Spoke with patient regarding recommendations for home health PT/OT, he declines, but state he will notify [facility] team if he change his mind.  Expected Discharge Plan:  (Unknown at this time.  SNF/HH  recommended to patient.) Barriers to Discharge: Continued Medical Work up  Expected Discharge Plan and Services       Living arrangements for the past 2 months: Single Family Home                                       Social Determinants of Health (SDOH) Interventions SDOH Screenings   Food Insecurity: No Food Insecurity (02/24/2024)  Housing: Low Risk  (02/24/2024)  Transportation Needs: No Transportation Needs (02/24/2024)  Utilities: Not At Risk (02/24/2024)  Depression (PHQ2-9): Low Risk  (12/23/2021)  Financial Resource Strain: Low Risk  (01/21/2024)   Received from [facility]  Social Connections: Socially Integrated (02/24/2024)  Tobacco Use: Medium Risk (02/24/2024)    Readmission Risk Interventions     No data to display

## 2024-02-27 NOTE — Progress Notes (Signed)
 Mobility Specialist - Progress Note   02/27/24 1054  Mobility  Activity Transferred to/from [facility]  Level of Assistance Standby assist, set-up cues, supervision of patient - no hands on  Assistive Device None  Distance Ambulated (ft) 2 ft  Activity Response Tolerated well  Mobility Referral Yes  Mobility visit 1 Mobility  Mobility Specialist Start Time (ACUTE ONLY) 1041  Mobility Specialist Stop Time (ACUTE ONLY) 1052  Mobility Specialist Time Calculation (min) (ACUTE ONLY) 11 min   Pt semi-supine in bed on RA, requesting assistance with transfer to/from [facility]. Pt able to transfer to/from [facility] S[facility] with no physical assistance. Pt left in bed with needs in reach and visitor present.   Wash Hack  Mobility Specialist  02/27/24 10:56 AM

## 2024-02-27 NOTE — Progress Notes (Addendum)
 Progress Note    Dillon [name]  ZOX:[id number] DOB: 1956/07/21  DOA: 02/24/2024 PCP: Pcp, No      Brief Narrative:    Medical records reviewed and are as summarized below:  [name] is a 68 y.o. male with medical history significant for COPD, pulmonary fibrosis, chronic hypoxic respiratory failure on 5 L/min oxygen , restless leg syndrome, GERD, hypertension, CAD, who presented to the hospital with acute onset of right-sided chest pain and shortness of breath.  He was found to have large right sided pneumothorax complicated by acute on chronic hypoxic respiratory failure.  He initially required BiPAP in the ED for acute respiratory failure. Chest tube was inserted in the right pleural space in the emergency room on 02/24/2024.    Assessment/Plan:   Principal Problem:   Pneumothorax Active Problems:   Acute on chronic hypoxic respiratory failure (HCC)   Coronary artery disease due to lipid rich plaque   Essential hypertension   Pulmonary fibrosis (HCC)   GERD without esophagitis   Chronic heart failure with preserved ejection fraction (HFpEF) (HCC)    Right-sided pneumothorax: Repeat chest x-ray on 03/08/2023 shows resolution of right pneumothorax.  S/p right-sided chest tube placement on 02/24/2024.  Analgesics as needed for pain.  Awaiting further recommendations from pulmonologist.     Acute on chronic hypoxic respiratory failure: Oxygen  has been weaned down from 9 L to 5 L.  He uses 5 L/min oxygen  at baseline. Initially required BiPAP in the ED.   COPD, pulmonary fibrosis: Continue prednisone , treprostinil , pirfenidone , Roflumilast  and bronchodilators   Chronic HFpEF: Compensated   Troponins: Troponins peaked at 140.  This is likely from demand ischemia from respiratory failure.     Diet Order             Diet heart healthy/carb modified Room service appropriate? Yes; Fluid consistency: Thin  Diet effective now                             Consultants: Pulmonologist  Procedures: Right-sided chest tube placement in the ER on 02/24/2024    Medications:    albuterol   2.5 mg Nebulization BID   atorvastatin   40 mg Oral Daily   budeson-glycopyrrolate -formoterol   2 puff Inhalation BID   carvedilol   6.25 mg Oral BID WC   enoxaparin  (LOVENOX ) injection  40 mg Subcutaneous Q24H   pantoprazole   40 mg Oral Daily   Pirfenidone   801 mg Oral TID AC   predniSONE   20 mg Oral Daily   roflumilast   500 mcg Oral Daily   Treprostinil   18 mcg Inhalation QID   Continuous Infusions:   Anti-infectives (From admission, onward)    None              Family Communication/Anticipated D/C date and plan/Code Status   DVT prophylaxis: enoxaparin  (LOVENOX ) injection 40 mg Start: 02/24/24 2200     Code Status: Full Code  Family Communication: Plan discussed with his wife at the bedside Disposition Plan: Plan to discharge home   Status is: Inpatient Remains inpatient appropriate because: Right-sided pneumothorax       Subjective:   Interval events noted.  He feels better.  He still has some right-sided chest pain, mainly at chest tube site, but overall it is better.  No shortness of breath.  His wife was at the bedside.  Objective:    Vitals:   02/27/24 8119 02/27/24 1478 02/27/24 2956 02/27/24 2130  BP: 118/81 127/84 117/72   Pulse: 70 79 81   Resp: 20 20 16    Temp: 98 F (36.7 C) 98.1 F (36.7 C) 98 F (36.7 C)   TempSrc: Oral     SpO2: 91% 97% (!) 89% 93%   No data found.   Intake/Output Summary (Last 24 hours) at 02/27/2024 1457 Last data filed at 02/27/2024 1428 Gross per 24 hour  Intake 600 ml  Output 550 ml  Net 50 ml   There were no vitals filed for this visit.  Exam:  GEN: NAD SKIN: Warm and dry EYES: Anicteric ENT: MMM CV: RRR PULM: Bibasilar rales.  Chest tube in right chest wall ABD: soft, ND, NT, +BS CNS: AAO x 3, non focal EXT: No edema or tenderness           Data Reviewed:   I have personally reviewed following labs and imaging studies:  Labs: Labs show the following:   Basic Metabolic Panel: Recent Labs  Lab 02/24/24 0650 02/25/24 0217  NA 136 135  K 4.0 4.7  CL 101 101  CO2 23 27  GLUCOSE 185* 143*  BUN 17 17  CREATININE 0.75 0.62  CALCIUM  9.0 9.0   GFR Estimated Creatinine Clearance: 104.2 mL/min (by C-G formula based on SCr of 0.62 mg/dL). Liver Function Tests: Recent Labs  Lab 02/24/24 0650 02/25/24 0217  AST 30 29  ALT 40 37  ALKPHOS 59 50  BILITOT 1.0 0.8  PROT 7.5 6.5  ALBUMIN 3.7 3.4*   No results for input(s): "LIPASE", "AMYLASE" in the last 168 hours. No results for input(s): "AMMONIA" in the last 168 hours. Coagulation profile No results for input(s): "INR", "PROTIME" in the last 168 hours.  CBC: Recent Labs  Lab 02/24/24 0650 02/25/24 0217  WBC 13.2* 12.7*  NEUTROABS 6.6  --   HGB 15.7 14.4  HCT 46.3 41.2  MCV 104.5* 101.7*  PLT 192 143*   Cardiac Enzymes: No results for input(s): "CKTOTAL", "CKMB", "CKMBINDEX", "TROPONINI" in the last 168 hours. BNP (last 3 results) No results for input(s): "PROBNP" in the last 8760 hours. CBG: Recent Labs  Lab 02/25/24 2154  GLUCAP 117*   D-Dimer: No results for input(s): "DDIMER" in the last 72 hours. Hgb A1c: No results for input(s): "HGBA1C" in the last 72 hours. Lipid Profile: No results for input(s): "CHOL", "HDL", "LDLCALC", "TRIG", "CHOLHDL", "LDLDIRECT" in the last 72 hours. Thyroid function studies: No results for input(s): "TSH", "T4TOTAL", "T3FREE", "THYROIDAB" in the last 72 hours.  Invalid input(s): "FREET3" Anemia work up: No results for input(s): "VITAMINB12", "FOLATE", "FERRITIN", "TIBC", "IRON", "RETICCTPCT" in the last 72 hours. Sepsis Labs: Recent Labs  Lab 02/24/24 0650 02/25/24 0217  WBC 13.2* 12.7*    Microbiology No results found for this or any previous visit (from the past 240 hours).  Procedures  and diagnostic studies:  DG Chest Port 1 View Result Date: 02/26/2024 CLINICAL DATA:  Pneumothorax on right. EXAM: PORTABLE CHEST 1 VIEW COMPARISON:  02/24/2024 FINDINGS: Again noted is a pigtail right chest tube along the lateral aspect of the right hemithorax. Right pneumothorax has resolved. Coarse interstitial lung markings are again noted and compatible with chronic changes. Trachea is midline. Heart size is normal. IMPRESSION: Resolved right pneumothorax. Right chest tube remains in place. Electronically Signed   By: Elene Griffes M.D.   On: 02/26/2024 10:53               LOS: 3 days   Lajean Boese  [facility]   Pager on [email or web address]. If 7PM-7AM, please contact night-coverage at [email or web address]     02/27/2024, 2:57 PM

## 2024-02-27 NOTE — Progress Notes (Signed)
 Mobility Specialist - Progress Note   02/27/24 1403  Mobility  Activity Stood at bedside;Transferred from bed to chair  Level of Assistance Standby assist, set-up cues, supervision of patient - no hands on  Assistive Device Front wheel walker  Distance Ambulated (ft) 3 ft  Range of Motion/Exercises Active  Activity Response Tolerated well  Mobility Referral Yes  Mobility visit 1 Mobility  Mobility Specialist Start Time (ACUTE ONLY) 1350  Mobility Specialist Stop Time (ACUTE ONLY) 1403  Mobility Specialist Time Calculation (min) (ACUTE ONLY) 13 min   Pt resting in bed on RA upon entry. Pt STS and and transfers to recliner with no AD. MinA due to lines and leads during transfer. Pt left in recliner with needs in reach. Daughter present at bedside.   Jerri Morale Mobility Specialist 02/27/24, 2:06 PM

## 2024-02-27 NOTE — Care Management Important Message (Signed)
 Important Message  Patient Details  Name: Dillon [name] MRN: [id number] Date of Birth: April 18, 1956   Important Message Given:  Yes - Medicare IM     [name], CMA 02/27/2024, 10:50 AM

## 2024-02-27 NOTE — Plan of Care (Signed)

## 2024-02-28 ENCOUNTER — Inpatient Hospital Stay

## 2024-02-28 DIAGNOSIS — J939 Pneumothorax, unspecified: Secondary | ICD-10-CM | POA: Diagnosis not present

## 2024-02-28 NOTE — Plan of Care (Signed)
  Problem: Health Behavior/Discharge Planning: Goal: Ability to manage health-related needs will improve Outcome: Progressing   Problem: Clinical Measurements: Goal: Ability to maintain clinical measurements within normal limits will improve Outcome: Progressing   Problem: Activity: Goal: Risk for activity intolerance will decrease Outcome: Progressing   Problem: Coping: Goal: Level of anxiety will decrease Outcome: Progressing   

## 2024-02-28 NOTE — Progress Notes (Signed)
 PULMONOLOGY         Date: 02/28/2024,   MRN# [id number] Dillon [name] [facility] [birth year]     Admission                  Current   Referring provider: Dr Daisey Dryer   CHIEF COMPLAINT:   Pneumothorax   HISTORY OF PRESENT ILLNESS   Rodrickus Steig is a 68 y.o. male with medical history significant of chronic respiratory failure on 4-5 L, COPD, GERD, pulmonary fibrosis, restless leg syndrome and coronary artery disease presenting with acute on chronic respiratory failure with hypoxia and pneumothorax.  Patient reports acute onset of severe right-sided chest pain and shortness of breath earlier this morning.  He reports pain started when he was getting prepared for work on Tuesday and as he was taking shirt off to get into shower he felt severe pain suddenly. No focal hemiparesis or confusion.  No abdominal pain.  Positive mild right-sided chest pain with deep breathing and movement.  Has been compliant otherwise with home respiratory regimen.   Presented to the ER afebrile, heart rate into the 100s, initially requiring BiPAP.  Noted moderate to large right-sided pneumothorax on chest x-ray.  Chest tube with pigtail catheter placed by ER physician. Patient is now improved but still has some residual pain on right hemithorax. PCCM consultation for further management of secondary spontaneous pneumothorax.   02/26/24- patient with resolution of pneumothorax on cxr.  Will clamp tube overnight and repeat cxr in am for preparation to remove chest tube.     02/27/24- patient with no overnight events.  I have clamped chest tube and we will repeat cxr in am in preparation for chest tube removal.    02/28/24- patient with recurrence of pneumothorax overnight after clamping the chest tube overnight. We have repeated cxr this am and then re-opened chest tube to suction.  Ive discussed with patient plan to keep chest tube open to suction.  There is no air leak noted on chest tube management.   PAST MEDICAL  HISTORY   Past Medical History:  Diagnosis Date   Chronic cough    COPD (chronic obstructive pulmonary disease) (HCC)    Coronary artery disease    Edentulous    Elevated cholesterol    GERD (gastroesophageal reflux disease)    Myocardial infarction (HCC)    Pulmonary fibrosis (HCC)    Restless leg syndrome    Supplemental oxygen  dependent      SURGICAL HISTORY   Past Surgical History:  Procedure Laterality Date   ANGIOPLASTY  2010   2 stents placed   BACK SURGERY     cardiac stents     x2   CATARACT EXTRACTION W/PHACO Left 08/20/2023   Procedure: CATARACT EXTRACTION PHACO AND INTRAOCULAR LENS PLACEMENT (IOC) LEFT 16.87 01:21.8;  Surgeon: Trudi Fus, MD;  Location: [facility];  Service: Ophthalmology;  Laterality: Left;   CATARACT EXTRACTION W/PHACO Right 09/03/2023   Procedure: CATARACT EXTRACTION PHACO AND INTRAOCULAR LENS PLACEMENT (IOC) RIGHT 19.41 01:30.0;  Surgeon: Trudi Fus, MD;  Location: [facility];  Service: Ophthalmology;  Laterality: Right;   COLONOSCOPY     COLONOSCOPY WITH PROPOFOL  N/A 03/05/2018   Procedure: COLONOSCOPY WITH PROPOFOL ;  Surgeon: Irby Mannan, MD;  Location: [facility] ENDOSCOPY;  Service: Endoscopy;  Laterality: N/A;   COLONOSCOPY WITH PROPOFOL  N/A 01/24/2022   Procedure: COLONOSCOPY WITH PROPOFOL ;  Surgeon: Luke Salaam, MD;  Location: [facility] ENDOSCOPY;  Service: Gastroenterology;  Laterality: N/A;  CORONARY ANGIOPLASTY     CORONARY/GRAFT ACUTE MI REVASCULARIZATION N/A 09/30/2019   Procedure: Coronary/Graft Acute MI Revascularization;  Surgeon: Wenona Hamilton, MD;  Location: [facility] INVASIVE CV LAB;  Service: Cardiovascular;  Laterality: N/A;   LEFT HEART CATH AND CORONARY ANGIOGRAPHY N/A 09/30/2019   Procedure: LEFT HEART CATH AND CORONARY ANGIOGRAPHY;  Surgeon: Wenona Hamilton, MD;  Location: [facility] INVASIVE CV LAB;  Service: Cardiovascular;  Laterality: N/A;   LEFT HEART CATH AND CORS/GRAFTS  ANGIOGRAPHY N/A 10/01/2021   Procedure: LEFT HEART CATH AND CORS/GRAFTS ANGIOGRAPHY;  Surgeon: Sammy Crisp, MD;  Location: [facility] INVASIVE CV LAB;  Service: Cardiovascular;  Laterality: N/A;     FAMILY HISTORY   Family History  Problem Relation Age of Onset   Heart disease Mother    Heart disease Father      SOCIAL HISTORY   Social History   Tobacco Use   Smoking status: Former    Current packs/day: 0.00    Average packs/day: 2.0 packs/day for 40.0 years (80.0 ttl pk-yrs)    Types: Cigarettes    Start date: 66    Quit date: 2013    Years since quitting: 12.3   Smokeless tobacco: Never  Vaping Use   Vaping status: Never Used  Substance Use Topics   Alcohol  use: Not Currently    Comment: occassionally - 1 beer/month or less   Drug use: Not Currently     MEDICATIONS    Home Medication:    Current Medication:  Current Facility-Administered Medications:    acetaminophen  (TYLENOL ) tablet 650 mg, 650 mg, Oral, Q6H PRN, Sheril Dines, MD, 650 mg at 02/28/24 0004   albuterol  (PROVENTIL ) (2.5 MG/3ML) 0.083% nebulizer solution 2.5 mg, 2.5 mg, Nebulization, BID, Ayiku, Bernard, MD, 2.5 mg at 02/28/24 2841   atorvastatin  (LIPITOR ) tablet 40 mg, 40 mg, Oral, Daily, Sheril Dines, MD, 40 mg at 02/28/24 0846   budeson-glycopyrrolate -formoterol  (BREZTRI ) 160-9-4.8 MCG/ACT inhaler 2 puff, 2 puff, Inhalation, BID, Ayiku, Bernard, MD, 2 puff at 02/28/24 0750   carvedilol  (COREG ) tablet 6.25 mg, 6.25 mg, Oral, BID WC, Sheril Dines, MD, 6.25 mg at 02/28/24 0847   enoxaparin  (LOVENOX ) injection 40 mg, 40 mg, Subcutaneous, Q24H, Corrinne Din, MD, 40 mg at 02/27/24 2125   HYDROmorphone  (DILAUDID ) injection 1 mg, 1 mg, Intravenous, Q2H PRN, Corrinne Din, MD, 1 mg at 02/28/24 3244   ondansetron  (ZOFRAN ) tablet 4 mg, 4 mg, Oral, Q6H PRN **OR** ondansetron  (ZOFRAN ) injection 4 mg, 4 mg, Intravenous, Q6H PRN, Corrinne Din, MD, 4 mg at 02/24/24 2257   pantoprazole  (PROTONIX )  EC tablet 40 mg, 40 mg, Oral, Daily, Sheril Dines, MD, 40 mg at 02/28/24 0847   Pirfenidone  TABS 801 mg, 801 mg, Oral, TID AC, Ayiku, Bernard, MD, 801 mg at 02/28/24 1223   predniSONE  (DELTASONE ) tablet 20 mg, 20 mg, Oral, Daily, Sheril Dines, MD, 20 mg at 02/28/24 0102   roflumilast  (DALIRESP ) tablet 500 mcg, 500 mcg, Oral, Daily, Sheril Dines, MD, 500 mcg at 02/28/24 1154   Treprostinil  (TYVASO ) inhalation solution 18 mcg, 18 mcg, Inhalation, QID, Sheril Dines, MD, 18 mcg at 02/28/24 1227    ALLERGIES   Ace inhibitors and Cinnamon     REVIEW OF SYSTEMS    Review of Systems:  Gen:  Denies  fever, sweats, chills weigh loss  HEENT: Denies blurred vision, double vision, ear pain, eye pain, hearing loss, nose bleeds, sore throat Cardiac:  No dizziness, chest pain or heaviness, chest tightness,edema Resp:   reports dyspnea  chronically  Gi: Denies swallowing difficulty, stomach pain, nausea or vomiting, diarrhea, constipation, bowel incontinence Gu:  Denies bladder incontinence, burning urine Ext:   Denies Joint pain, stiffness or swelling Skin: Denies  skin rash, easy bruising or bleeding or hives Endoc:  Denies polyuria, polydipsia , polyphagia or weight change Psych:   Denies depression, insomnia or hallucinations   Other:  All other systems negative   VS: BP 118/73 (BP Location: Left Arm)   Pulse 74   Temp 97.7 F (36.5 C)   Resp 16   SpO2 99%      PHYSICAL EXAM    GENERAL:NAD, no fevers, chills, no weakness no fatigue HEAD: Normocephalic, atraumatic.  EYES: Pupils equal, round, reactive to light. Extraocular muscles intact. No scleral icterus.  MOUTH: Moist mucosal membrane. Dentition intact. No abscess noted.  EAR, NOSE, THROAT: Clear without exudates. No external lesions.  NECK: Supple. No thyromegaly. No nodules. No JVD.  PULMONARY: decreased breath sounds with mild rhonchi worse at bases bilaterally.  CARDIOVASCULAR: S1 and S2. Regular rate and rhythm.  No murmurs, rubs, or gallops. No edema. Pedal pulses 2+ bilaterally.  GASTROINTESTINAL: Soft, nontender, nondistended. No masses. Positive bowel sounds. No hepatosplenomegaly.  MUSCULOSKELETAL: No swelling, clubbing, or edema. Range of motion full in all extremities.  NEUROLOGIC: Cranial nerves II through XII are intact. No gross focal neurological deficits. Sensation intact. Reflexes intact.  SKIN: No ulceration, lesions, rashes, or cyanosis. Skin warm and dry. Turgor intact.  PSYCHIATRIC: Mood, affect within normal limits. The patient is awake, alert and oriented x 3. Insight, judgment intact.       IMAGING  Reviewed CXR and CT with patient and family at bedside  ASSESSMENT/PLAN   Secondary spontaneous pneumothorax                - chest tube on right - mild to moderate pain and tenderness             - cxr with residual moderate pneumothorax remaining    - continue chest tube to negative 20 cm H20    -repeat CXR - chest tube management - +air leak   2. Advanced COPD           Continue nebulizer therapy          Continue abx  3. Bibasilar atelectasis             IS at bedside   4. Physical deconditioning       PT/OT when able             Thank you for allowing me to participate in the care of this patient.   Patient/Family are satisfied with care plan and all questions have been answered.    Provider disclosure: Patient with at least one acute or chronic illness or injury that poses a threat to life or bodily function and is being managed actively during this encounter.  All of the below services have been performed independently by signing provider:  review of prior documentation from internal and or external health records.  Review of previous and current lab results.  Interview and comprehensive assessment during patient visit today. Review of current and previous chest radiographs/CT scans. Discussion of management and test interpretation with health care team and  patient/family.   This document was prepared using Dragon voice recognition software and may include unintentional dictation errors.     Claudette Wermuth, M.D.  Division of Pulmonary & Critical Care Medicine

## 2024-02-28 NOTE — Plan of Care (Signed)
   Problem: Education: Goal: Knowledge of General Education information will improve Description Including pain rating scale, medication(s)/side effects and non-pharmacologic comfort measures Outcome: Progressing   Problem: Health Behavior/Discharge Planning: Goal: Ability to manage health-related needs will improve Outcome: Progressing

## 2024-02-28 NOTE — Progress Notes (Addendum)
 Progress Note    Dillon [name]  ZOX:[id number] DOB: 1956/09/06  DOA: 02/24/2024 PCP: Pcp, No      Brief Narrative:    Medical records reviewed and are as summarized below:  Dillon [name] is a 68 y.o. male with medical history significant for COPD, pulmonary fibrosis, chronic hypoxic respiratory failure on 5 L/min oxygen , restless leg syndrome, GERD, hypertension, CAD, who presented to the hospital with acute onset of right-sided chest pain and shortness of breath.  He was found to have large right sided pneumothorax complicated by acute on chronic hypoxic respiratory failure.  He initially required BiPAP in the ED for acute respiratory failure. Chest tube was inserted in the right pleural space in the emergency room on 02/24/2024.    Assessment/Plan:   Principal Problem:   Pneumothorax Active Problems:   Acute on chronic hypoxic respiratory failure (HCC)   Coronary artery disease due to lipid rich plaque   Essential hypertension   Pulmonary fibrosis (HCC)   GERD without esophagitis   Chronic heart failure with preserved ejection fraction (HFpEF) (HCC)    Recurrent right-sided pneumothorax: Repeat chest x-ray on 02/28/2024 showed recurrent moderate to large right pneumothorax.  Chest tube has been reconnected to suction. Of note, chest x-ray on 02/26/2024 which showed resolution of pneumothorax. S/p right-sided chest tube placement on 02/24/2024.  Analgesics as needed for pain.  Follow-up with pulmonologist.   Acute on chronic hypoxic respiratory failure: Worsened.  Oxygen  therapy escalated from 5 to 9 L/min via [facility].   He uses 5 L/min oxygen  at baseline. Initially required BiPAP in the ED.   COPD, pulmonary fibrosis: Continue prednisone , treprostinil , pirfenidone , Roflumilast  and bronchodilators   Chronic HFpEF: Compensated   Troponins: Troponins peaked at 140.  This is likely from demand ischemia from respiratory failure.   Plan of care was discussed with Mariah Shines  (daughter) and patient's wife at the bedside.  They requested to see chest x-ray images.  Chest x-ray images were pulled up on the computer in patient's room and findings were explained to them.  Diet Order             Diet heart healthy/carb modified Room service appropriate? Yes; Fluid consistency: Thin  Diet effective now                            Consultants: Pulmonologist  Procedures: Right-sided chest tube placement in the ER on 02/24/2024    Medications:    albuterol   2.5 mg Nebulization BID   atorvastatin   40 mg Oral Daily   budeson-glycopyrrolate -formoterol   2 puff Inhalation BID   carvedilol   6.25 mg Oral BID WC   enoxaparin  (LOVENOX ) injection  40 mg Subcutaneous Q24H   pantoprazole   40 mg Oral Daily   Pirfenidone   801 mg Oral TID AC   predniSONE   20 mg Oral Daily   roflumilast   500 mcg Oral Daily   Treprostinil   18 mcg Inhalation QID   Continuous Infusions:   Anti-infectives (From admission, onward)    None              Family Communication/Anticipated D/C date and plan/Code Status   DVT prophylaxis: enoxaparin  (LOVENOX ) injection 40 mg Start: 02/24/24 2200     Code Status: Full Code  Family Communication: Plan discussed with his wife and Mariah Shines (daughter) at the bedside Disposition Plan: Plan to discharge home   Status is: Inpatient Remains inpatient appropriate because: Right-sided pneumothorax  Subjective:   Interval events noted.  Patient had complained of shortness of breath earlier this morning.  Repeat chest x-ray showed recurrent pneumothorax.  Chest tube was reconnected to suction.  Objective:    Vitals:   02/28/24 0653 02/28/24 0705 02/28/24 0806 02/28/24 1200  BP:   124/78 118/73  Pulse:   91 74  Resp:   16 16  Temp:   97.8 F (36.6 C) 97.7 F (36.5 C)  TempSrc:      SpO2: (!) 89% 91% 93% 99%   No data found.   Intake/Output Summary (Last 24 hours) at 02/28/2024 1455 Last data filed at  02/28/2024 0455 Gross per 24 hour  Intake --  Output 200 ml  Net -200 ml   There were no vitals filed for this visit.  Exam:   GEN: NAD SKIN: Warm and dry EYES: No pallor or icterus ENT: MMM CV: RRR PULM: Decreased air entry right hemithorax.  Bibasilar rales.  No wheezing.  Chest tube in right chest wall. ABD: soft, ND, NT, +BS CNS: AAO x 3, non focal EXT: No edema or tenderness        Data Reviewed:   I have personally reviewed following labs and imaging studies:  Labs: Labs show the following:   Basic Metabolic Panel: Recent Labs  Lab 02/24/24 0650 02/25/24 0217  NA 136 135  K 4.0 4.7  CL 101 101  CO2 23 27  GLUCOSE 185* 143*  BUN 17 17  CREATININE 0.75 0.62  CALCIUM  9.0 9.0   GFR Estimated Creatinine Clearance: 104.2 mL/min (by C-G formula based on SCr of 0.62 mg/dL). Liver Function Tests: Recent Labs  Lab 02/24/24 0650 02/25/24 0217  AST 30 29  ALT 40 37  ALKPHOS 59 50  BILITOT 1.0 0.8  PROT 7.5 6.5  ALBUMIN 3.7 3.4*   No results for input(s): "LIPASE", "AMYLASE" in the last 168 hours. No results for input(s): "AMMONIA" in the last 168 hours. Coagulation profile No results for input(s): "INR", "PROTIME" in the last 168 hours.  CBC: Recent Labs  Lab 02/24/24 0650 02/25/24 0217  WBC 13.2* 12.7*  NEUTROABS 6.6  --   HGB 15.7 14.4  HCT 46.3 41.2  MCV 104.5* 101.7*  PLT 192 143*   Cardiac Enzymes: No results for input(s): "CKTOTAL", "CKMB", "CKMBINDEX", "TROPONINI" in the last 168 hours. BNP (last 3 results) No results for input(s): "PROBNP" in the last 8760 hours. CBG: Recent Labs  Lab 02/25/24 2154  GLUCAP 117*   D-Dimer: No results for input(s): "DDIMER" in the last 72 hours. Hgb A1c: No results for input(s): "HGBA1C" in the last 72 hours. Lipid Profile: No results for input(s): "CHOL", "HDL", "LDLCALC", "TRIG", "CHOLHDL", "LDLDIRECT" in the last 72 hours. Thyroid function studies: No results for input(s): "TSH",  "T4TOTAL", "T3FREE", "THYROIDAB" in the last 72 hours.  Invalid input(s): "FREET3" Anemia work up: No results for input(s): "VITAMINB12", "FOLATE", "FERRITIN", "TIBC", "IRON", "RETICCTPCT" in the last 72 hours. Sepsis Labs: Recent Labs  Lab 02/24/24 0650 02/25/24 0217  WBC 13.2* 12.7*    Microbiology No results found for this or any previous visit (from the past 240 hours).  Procedures and diagnostic studies:  DG Chest Port 1 View Addendum Date: 02/28/2024 ADDENDUM REPORT: 02/28/2024 08:11 ADDENDUM: Critical Value/emergent results were called by telephone at the time of interpretation on 02/28/2024 at 8:11 am to provider Dr. Leory Rands, who verbally acknowledged these results. Electronically Signed   By: Kimberley Penman M.D.   On: 02/28/2024 08:11  Result Date: 02/28/2024 CLINICAL DATA:  Follow-up right pneumothorax. EXAM: PORTABLE CHEST 1 VIEW COMPARISON:  02/26/2024 FINDINGS: Right-sided pigtail thoracostomy tube is identified along the lateral aspect of the right lower lung. Recurrent moderate to large right pneumothorax is identified which measures 3.1 cm over the apex. Increased soft tissue gas within the right lateral chest wall. Stable cardiomediastinal contours. Signs of advanced chronic interstitial lung disease is again seen with diffuse reticular interstitial opacities throughout both lungs. IMPRESSION: 1. Recurrent moderate to large right pneumothorax. 2. Right-sided pigtail thoracostomy tube is identified along the lateral aspect of the right lower lung. 3. Signs of advanced chronic interstitial lung disease. Electronically Signed: By: Kimberley Penman M.D. On: 02/28/2024 07:57               LOS: 4 days   Shahan Starks  [facility]   Pager on [email or web address]. If 7PM-7AM, please contact night-coverage at [email or web address]     02/28/2024, 2:55 PM

## 2024-02-29 ENCOUNTER — Inpatient Hospital Stay

## 2024-02-29 DIAGNOSIS — J939 Pneumothorax, unspecified: Secondary | ICD-10-CM | POA: Diagnosis not present

## 2024-02-29 MED ORDER — KETOROLAC TROMETHAMINE 30 MG/ML IJ SOLN: 15.0000 mg | Freq: Once | INTRAMUSCULAR | Status: DC

## 2024-02-29 NOTE — Plan of Care (Signed)

## 2024-02-29 NOTE — Progress Notes (Signed)
 OT Cancellation Note  Patient Details Name: Dillon [name] MRN: [id number] DOB: 11-17-1955   Cancelled Treatment:    Reason Eval/Treat Not Completed: Medical issues which prohibited therapy. Pt currently on 9L/min O2 via [place]. RN cleared pt for participation in therapy, upon OT arrival to room, pt and spouse expressing concerns of chest tube location and dressing. RN notified - OT will hold tx at this time until RN can further assess and check back as able.   Jamilet Ambroise L. Kairos Panetta, OTR/L  02/29/24, 10:31 AM

## 2024-02-29 NOTE — Plan of Care (Signed)

## 2024-02-29 NOTE — Progress Notes (Signed)
 PULMONOLOGY         Date: 02/29/2024,   MRN# [id number] Dillon [name] [facility] [birth year]     Admission                  Current   Referring provider: Dr Daisey Dryer   CHIEF COMPLAINT:   Pneumothorax   HISTORY OF PRESENT ILLNESS   Dillon [name] is a 68 y.o. male with medical history significant of chronic respiratory failure on 4-5 L, COPD, GERD, pulmonary fibrosis, restless leg syndrome and coronary artery disease presenting with acute on chronic respiratory failure with hypoxia and pneumothorax.  Patient reports acute onset of severe right-sided chest pain and shortness of breath earlier this morning.  He reports pain started when he was getting prepared for work on Tuesday and as he was taking shirt off to get into shower he felt severe pain suddenly. No focal hemiparesis or confusion.  No abdominal pain.  Positive mild right-sided chest pain with deep breathing and movement.  Has been compliant otherwise with home respiratory regimen.   Presented to the ER afebrile, heart rate into the 100s, initially requiring BiPAP.  Noted moderate to large right-sided pneumothorax on chest x-ray.  Chest tube with pigtail catheter placed by ER physician. Patient is now improved but still has some residual pain on right hemithorax. PCCM consultation for further management of secondary spontaneous pneumothorax.   02/26/24- patient with resolution of pneumothorax on cxr.  Will clamp tube overnight and repeat cxr in am for preparation to remove chest tube.     02/27/24- patient with no overnight events.  I have clamped chest tube and we will repeat cxr in am in preparation for chest tube removal.    02/28/24- patient with recurrence of pneumothorax overnight after clamping the chest tube overnight. We have repeated cxr this am and then re-opened chest tube to suction.  Ive discussed with patient plan to keep chest tube open to suction.  There is no air leak noted on chest tube management.   PAST MEDICAL  HISTORY   Past Medical History:  Diagnosis Date   Chronic cough    COPD (chronic obstructive pulmonary disease) (HCC)    Coronary artery disease    Edentulous    Elevated cholesterol    GERD (gastroesophageal reflux disease)    Myocardial infarction (HCC)    Pulmonary fibrosis (HCC)    Restless leg syndrome    Supplemental oxygen  dependent      SURGICAL HISTORY   Past Surgical History:  Procedure Laterality Date   ANGIOPLASTY  2010   2 stents placed   BACK SURGERY     cardiac stents     x2   CATARACT EXTRACTION W/PHACO Left 08/20/2023   Procedure: CATARACT EXTRACTION PHACO AND INTRAOCULAR LENS PLACEMENT (IOC) LEFT 16.87 01:21.8;  Surgeon: Trudi Fus, MD;  Location: [facility];  Service: Ophthalmology;  Laterality: Left;   CATARACT EXTRACTION W/PHACO Right 09/03/2023   Procedure: CATARACT EXTRACTION PHACO AND INTRAOCULAR LENS PLACEMENT (IOC) RIGHT 19.41 01:30.0;  Surgeon: Trudi Fus, MD;  Location: [facility];  Service: Ophthalmology;  Laterality: Right;   COLONOSCOPY     COLONOSCOPY WITH PROPOFOL  N/A 03/05/2018   Procedure: COLONOSCOPY WITH PROPOFOL ;  Surgeon: Irby Mannan, MD;  Location: [facility] ENDOSCOPY;  Service: Endoscopy;  Laterality: N/A;   COLONOSCOPY WITH PROPOFOL  N/A 01/24/2022   Procedure: COLONOSCOPY WITH PROPOFOL ;  Surgeon: Luke Salaam, MD;  Location: [facility];  Service: Gastroenterology;  Laterality: N/A;  CORONARY ANGIOPLASTY     CORONARY/GRAFT ACUTE MI REVASCULARIZATION N/A 09/30/2019   Procedure: Coronary/Graft Acute MI Revascularization;  Surgeon: Wenona Hamilton, MD;  Location: [facility] INVASIVE CV LAB;  Service: Cardiovascular;  Laterality: N/A;   LEFT HEART CATH AND CORONARY ANGIOGRAPHY N/A 09/30/2019   Procedure: LEFT HEART CATH AND CORONARY ANGIOGRAPHY;  Surgeon: Wenona Hamilton, MD;  Location: [facility] INVAS[facility] CV [facility];  Service: Cardiovascular;  Laterality: N/A;   LEFT HEART CATH AND CORS/GRAFTS  ANGIOGRAPHY N/A 10/01/2021   Procedure: LEFT HEART CATH AND CORS/GRAFTS ANGIOGRAPHY;  Surgeon: Sammy Crisp, MD;  Location: [facility] INVAS[facility] CV [facility];  Service: Cardiovascular;  Laterality: N/A;     FAMILY HISTORY   Family History  Problem Relation Age of Onset   Heart disease Mother    Heart disease Father      SOCIAL HISTORY   Social History   Tobacco Use   Smoking status: Former    Current packs/day: 0.00    Average packs/day: 2.0 packs/day for 40.0 years (80.0 ttl pk-yrs)    Types: Cigarettes    Start date: 44    Quit date: 2013    Years since quitting: 12.3   Smokeless tobacco: Never  Vaping Use   Vaping status: Never Used  Substance Use Topics   Alcohol  use: Not Currently    Comment: occassionally - 1 beer/month or less   Drug use: Not Currently     MEDICATIONS    Home Medication:    Current Medication:  Current Facility-Administered Medications:    acetaminophen  (TYLENOL ) tablet 650 mg, 650 mg, Oral, Q6H PRN, Sheril Dines, MD, 650 mg at 02/29/24 1128   albuterol  (PROVENTIL ) (2.5 MG/3ML) 0.083% nebulizer solution 2.5 mg, 2.5 mg, Nebulization, BID, Ayiku, Bernard, MD, 2.5 mg at 02/29/24 0732   atorvastatin  (LIPITOR ) tablet 40 mg, 40 mg, Oral, Daily, Sheril Dines, MD, 40 mg at 02/29/24 0912   budeson-glycopyrrolate -formoterol  (BREZTRI ) 160-9-4.8 MCG/ACT inhaler 2 puff, 2 puff, Inhalation, BID, Ayiku, Bernard, MD, 2 puff at 02/29/24 0914   carvedilol  (COREG ) tablet 6.25 mg, 6.25 mg, Oral, BID WC, Sheril Dines, MD, 6.25 mg at 02/29/24 0912   enoxaparin  (LOVENOX ) injection 40 mg, 40 mg, Subcutaneous, Q24H, Corrinne Din, MD, 40 mg at 02/28/24 2156   HYDROmorphone  (DILAUDID ) injection 1 mg, 1 mg, Intravenous, Q2H PRN, Corrinne Din, MD, 1 mg at 02/28/24 5188   ondansetron  (ZOFRAN ) tablet 4 mg, 4 mg, Oral, Q6H PRN **OR** ondansetron  (ZOFRAN ) injection 4 mg, 4 mg, Intravenous, Q6H PRN, Corrinne Din, MD, 4 mg at 02/24/24 2257   pantoprazole  (PROTONIX )  EC tablet 40 mg, 40 mg, Oral, Daily, Sheril Dines, MD, 40 mg at 02/29/24 0913   Pirfenidone  TABS 801 mg, 801 mg, Oral, TID AC, Ayiku, Bernard, MD, 801 mg at 02/29/24 1213   predniSONE  (DELTASONE ) tablet 20 mg, 20 mg, Oral, Daily, Sheril Dines, MD, 20 mg at 02/29/24 0912   roflumilast  (DALIRESP ) tablet 500 mcg, 500 mcg, Oral, Daily, Sheril Dines, MD, 500 mcg at 02/29/24 0913   Treprostinil  (TYVASO ) inhalation solution 18 mcg, 18 mcg, Inhalation, QID, Sheril Dines, MD, 18 mcg at 02/29/24 1214    ALLERGIES   Ace inhibitors and Cinnamon     REVIEW OF SYSTEMS    Review of Systems:  Gen:  Denies  fever, sweats, chills weigh loss  HEENT: Denies blurred vision, double vision, ear pain, eye pain, hearing loss, nose bleeds, sore throat Cardiac:  No dizziness, chest pain or heaviness, chest tightness,edema Resp:   reports dyspnea  chronically  Gi: Denies swallowing difficulty, stomach pain, nausea or vomiting, diarrhea, constipation, bowel incontinence Gu:  Denies bladder incontinence, burning urine Ext:   Denies Joint pain, stiffness or swelling Skin: Denies  skin rash, easy bruising or bleeding or hives Endoc:  Denies polyuria, polydipsia , polyphagia or weight change Psych:   Denies depression, insomnia or hallucinations   Other:  All other systems negative   VS: BP 116/84 (BP Location: Left Arm)   Pulse 75   Temp 97.7 F (36.5 C)   Resp 18   SpO2 96%      PHYSICAL EXAM    GENERAL:NAD, no fevers, chills, no weakness no fatigue HEAD: Normocephalic, atraumatic.  EYES: Pupils equal, round, reactive to light. Extraocular muscles intact. No scleral icterus.  MOUTH: Moist mucosal membrane. Dentition intact. No abscess noted.  EAR, NOSE, THROAT: Clear without exudates. No external lesions.  NECK: Supple. No thyromegaly. No nodules. No JVD.  PULMONARY: decreased breath sounds with mild rhonchi worse at bases bilaterally.  CARDIOVASCULAR: S1 and S2. Regular rate and rhythm.  No murmurs, rubs, or gallops. No edema. Pedal pulses 2+ bilaterally.  GASTROINTESTINAL: Soft, nontender, nondistended. No masses. Positive bowel sounds. No hepatosplenomegaly.  MUSCULOSKELETAL: No swelling, clubbing, or edema. Range of motion full in all extremities.  NEUROLOGIC: Cranial nerves II through XII are intact. No gross focal neurological deficits. Sensation intact. Reflexes intact.  SKIN: No ulceration, lesions, rashes, or cyanosis. Skin warm and dry. Turgor intact.  PSYCHIATRIC: Mood, affect within normal limits. The patient is awake, alert and oriented x 3. Insight, judgment intact.       IMAGING  Reviewed CXR and CT with patient and family at bedside  ASSESSMENT/PLAN   Secondary spontaneous pneumothorax                - chest tube on right - mild to moderate pain and tenderness             - cxr with residual moderate pneumothorax remaining    - continue chest tube to negative 20 cm H20    -repeat CXR - chest tube management - +air leak   2. Advanced COPD           Continue nebulizer therapy          Continue abx  3. Bibasilar atelectasis             IS at bedside   4. Physical deconditioning       PT/OT when able             Thank you for allowing me to participate in the care of this patient.   Patient/Family are satisfied with care plan and all questions have been answered.    Provider disclosure: Patient with at least one acute or chronic illness or injury that poses a threat to life or bodily function and is being managed actively during this encounter.  All of the below services have been performed independently by signing provider:  review of prior documentation from internal and or external health records.  Review of previous and current lab results.  Interview and comprehensive assessment during patient visit today. Review of current and previous chest radiographs/CT scans. Discussion of management and test interpretation with health care team and  patient/family.   This document was prepared using Dragon voice recognition software and may include unintentional dictation errors.     Moss Berry, M.D.  Division of Pulmonary & Critical Care Medicine

## 2024-02-29 NOTE — Progress Notes (Addendum)
 Complaining of right sided chest pain 7/10. Offered PRN dilaudid , refused. States pain came on suddenly was dull now more sharp. He had PRN tylenol  right at 7pm. V/s stable as follow T 98, P 65, B/P 126/85, R 18, O2 sats 97% on 7L via . Right chest tube to suction. No leak. Enolia Hartmann NP made aware.   Ordered Toradol IV. Pt refused.

## 2024-02-29 NOTE — Progress Notes (Signed)
 Progress Note    Dillon [name]  WGN:[id number] DOB: 1955/11/10  DOA: 02/24/2024 PCP: Pcp, No      Brief Narrative:    Medical records reviewed and are as summarized below:  Dillon [name] is a 68 y.o. male with medical history significant for COPD, pulmonary fibrosis, chronic hypoxic respiratory failure on 5 L/min oxygen , restless leg syndrome, GERD, hypertension, CAD, who presented to the hospital with acute onset of right-sided chest pain and shortness of breath.  He was found to have large right sided pneumothorax complicated by acute on chronic hypoxic respiratory failure.  He initially required BiPAP in the ED for acute respiratory failure. Chest tube was inserted in the right pleural space in the emergency room on 02/24/2024.    Assessment/Plan:   Principal Problem:   Pneumothorax Active Problems:   Acute on chronic hypoxic respiratory failure (HCC)   Coronary artery disease due to lipid rich plaque   Essential hypertension   Pulmonary fibrosis (HCC)   GERD without esophagitis   Chronic heart failure with preserved ejection fraction (HFpEF) (HCC)    Recurrent right-sided pneumothorax: Repeat chest x-ray today shows improvement in right pneumothorax.  Continue chest tube to suction.   Of note, chest x-ray on 02/26/2024 which showed resolution of pneumothorax. S/p right-sided chest tube placement on 02/24/2024.  Analgesics as needed for pain.  Follow-up with pulmonologist.   Acute on chronic hypoxic respiratory failure: Oxygen  has been weaned down from 9 L to 7 L.  Continue to taper down oxygen  as able. He uses 5 L/min oxygen  at baseline. Initially required BiPAP in the ED.   COPD, pulmonary fibrosis: Continue prednisone , treprostinil , pirfenidone , Roflumilast  and bronchodilators   Chronic HFpEF: Compensated   Troponins: Troponins peaked at 140.  This is likely from demand ischemia from respiratory failure.   Plan discussed with his wife at the  bedside.   Diet Order             Diet heart healthy/carb modified Room service appropriate? Yes; Fluid consistency: Thin  Diet effective now                            Consultants: Pulmonologist  Procedures: Right-sided chest tube placement in the ER on 02/24/2024    Medications:    albuterol   2.5 mg Nebulization BID   atorvastatin   40 mg Oral Daily   budeson-glycopyrrolate -formoterol   2 puff Inhalation BID   carvedilol   6.25 mg Oral BID WC   enoxaparin  (LOVENOX ) injection  40 mg Subcutaneous Q24H   pantoprazole   40 mg Oral Daily   Pirfenidone   801 mg Oral TID AC   predniSONE   20 mg Oral Daily   roflumilast   500 mcg Oral Daily   Treprostinil   18 mcg Inhalation QID   Continuous Infusions:   Anti-infectives (From admission, onward)    None              Family Communication/Anticipated D/C date and plan/Code Status   DVT prophylaxis: enoxaparin  (LOVENOX ) injection 40 mg Start: 02/24/24 2200     Code Status: Full Code  Family Communication: Plan discussed with his wife at the bedside Disposition Plan: Plan to discharge home   Status is: Inpatient Remains inpatient appropriate because: Right-sided pneumothorax       Subjective:   Interval events noted.  He has pain at the chest tube site on the right chest wall.  He is concerned about going home too  soon because of recurrent pneumothorax.  No other complaints.  His wife was at the bedside.   Objective:    Vitals:   02/29/24 0700 02/29/24 0803 02/29/24 1214 02/29/24 1529  BP:  (!) 124/92 116/84 117/75  Pulse:  73 75 74  Resp:  18  15  Temp:  97.7 F (36.5 C) 97.7 F (36.5 C) 98.1 F (36.7 C)  TempSrc:      SpO2: 94% 96% 96% 95%   No data found.   Intake/Output Summary (Last 24 hours) at 02/29/2024 1556 Last data filed at 02/29/2024 0423 Gross per 24 hour  Intake 240 ml  Output 650 ml  Net -410 ml   There were no vitals filed for this visit.  Exam:   GEN:  NAD SKIN: Warm and dry EYES: No pallor or icterus ENT: MMM CV: RRR PULM: Fine bibasilar rales.  Chest tube right chest wall connected to suction ABD: soft, ND, NT, +BS CNS: AAO x 3, non focal EXT: No edema or tenderness         Data Reviewed:   I have personally reviewed following labs and imaging studies:  Labs: Labs show the following:   Basic Metabolic Panel: Recent Labs  Lab 02/24/24 0650 02/25/24 0217  NA 136 135  K 4.0 4.7  CL 101 101  CO2 23 27  GLUCOSE 185* 143*  BUN 17 17  CREATININE 0.75 0.62  CALCIUM  9.0 9.0   GFR Estimated Creatinine Clearance: 104.2 mL/min (by C-G formula based on SCr of 0.62 mg/dL). Liver Function Tests: Recent Labs  Lab 02/24/24 0650 02/25/24 0217  AST 30 29  ALT 40 37  ALKPHOS 59 50  BILITOT 1.0 0.8  PROT 7.5 6.5  ALBUMIN 3.7 3.4*   No results for input(s): "LIPASE", "AMYLASE" in the last 168 hours. No results for input(s): "AMMONIA" in the last 168 hours. Coagulation profile No results for input(s): "INR", "PROTIME" in the last 168 hours.  CBC: Recent Labs  Lab 02/24/24 0650 02/25/24 0217  WBC 13.2* 12.7*  NEUTROABS 6.6  --   HGB 15.7 14.4  HCT 46.3 41.2  MCV 104.5* 101.7*  PLT 192 143*   Cardiac Enzymes: No results for input(s): "CKTOTAL", "CKMB", "CKMBINDEX", "TROPONINI" in the last 168 hours. BNP (last 3 results) No results for input(s): "PROBNP" in the last 8760 hours. CBG: Recent Labs  Lab 02/25/24 2154  GLUCAP 117*   D-Dimer: No results for input(s): "DDIMER" in the last 72 hours. Hgb A1c: No results for input(s): "HGBA1C" in the last 72 hours. Lipid Profile: No results for input(s): "CHOL", "HDL", "LDLCALC", "TRIG", "CHOLHDL", "LDLDIRECT" in the last 72 hours. Thyroid function studies: No results for input(s): "TSH", "T4TOTAL", "T3FREE", "THYROIDAB" in the last 72 hours.  Invalid input(s): "FREET3" Anemia work up: No results for input(s): "VITAMINB12", "FOLATE", "FERRITIN", "TIBC",  "IRON", "RETICCTPCT" in the last 72 hours. Sepsis Labs: Recent Labs  Lab 02/24/24 0650 02/25/24 0217  WBC 13.2* 12.7*    Microbiology No results found for this or any previous visit (from the past 240 hours).  Procedures and diagnostic studies:  DG Chest Port 1 View Result Date: 02/28/2024 CLINICAL DATA:  Pneumothorax. EXAM: PORTABLE CHEST 1 VIEW COMPARISON:  Radiograph earlier today FINDINGS: Decreased right pneumothorax. Small residual persists at the apex. Pigtail catheter in the lateral chest wall coiled laterally. Underlying chronic lung disease with diffuse interstitial coarsening and subpleural reticulation. Stable heart size and mediastinal contours. No large pleural effusion. IMPRESSION: 1. Decreased right pneumothorax with small  residual at the apex. Pigtail catheter in the lateral chest wall coiled laterally. 2. Underlying chronic lung disease. Electronically Signed   By: Chadwick Colonel M.D.   On: 02/28/2024 14:57   DG Chest Port 1 View Addendum Date: 02/28/2024 ADDENDUM REPORT: 02/28/2024 08:11 ADDENDUM: Critical Value/emergent results were called by telephone at the time of interpretation on 02/28/2024 at 8:11 am to provider Dr. Leory Rands, who verbally acknowledged these results. Electronically Signed   By: Kimberley Penman M.D.   On: 02/28/2024 08:11   Result Date: 02/28/2024 CLINICAL DATA:  Follow-up right pneumothorax. EXAM: PORTABLE CHEST 1 VIEW COMPARISON:  02/26/2024 FINDINGS: Right-sided pigtail thoracostomy tube is identified along the lateral aspect of the right lower lung. Recurrent moderate to large right pneumothorax is identified which measures 3.1 cm over the apex. Increased soft tissue gas within the right lateral chest wall. Stable cardiomediastinal contours. Signs of advanced chronic interstitial lung disease is again seen with diffuse reticular interstitial opacities throughout both lungs. IMPRESSION: 1. Recurrent moderate to large right pneumothorax. 2. Right-sided  pigtail thoracostomy tube is identified along the lateral aspect of the right lower lung. 3. Signs of advanced chronic interstitial lung disease. Electronically Signed: By: Kimberley Penman M.D. On: 02/28/2024 07:57               LOS: 5 days   Yahia Bottger  [facility]   Pager on [email or web address]. If 7PM-7AM, please contact night-coverage at [email or web address]     02/29/2024, 3:56 PM

## 2024-02-29 NOTE — Progress Notes (Signed)
 Occupational Therapy Treatment Patient Details Name: Dillon [name] MRN: [id number] DOB: April 07, 1956 Today's Date: 02/29/2024   History of present illness 68 y.o. male with medical history significant of chronic respiratory failure on 4-5 L, COPD, GERD, pulmonary fibrosis, restless leg syndrome and coronary artery disease presenting with acute on chronic respiratory failure with hypoxia and pneumothorax s/p chest tube placement 5/7   OT comments  Pt received sitting in recliner with family present, motivated and agreeable to session. Session focused on PLB techniques and improving tolerance to activity with mobility. Performs STS transfers using UE support to push up from recliner, +1 HHA in standing for balance for marches. OT led recovery breaks in standing / seated with education on energy conservation. Further mobility limited by chest tube on wall suction. Pt on 9L/min via [place]. SpO2 drops to 90% with activity, improves to 95%> with PLB and rest breaks. Weaned O2 down to 7L/min end of session, SpO2 97%. RN notified. OT will continue to follow for functional gains, discharge recommendation appropriate.       If plan is discharge home, recommend the following:  A little help with walking and/or transfers;A little help with bathing/dressing/bathroom;Assistance with cooking/housework;Assist for transportation;Help with stairs or ramp for entrance   Equipment Recommendations  Other (comment)    Recommendations for Other Services      Precautions / Restrictions Precautions Precautions: Fall Precaution/Restrictions Comments: Right chest tube to suction Restrictions Weight Bearing Restrictions Per Provider Order: No       Mobility Bed Mobility Overal bed mobility: Needs Assistance             General bed mobility comments: NT, in recliner    Transfers Overall transfer level: Needs assistance Equipment used: None Transfers: Sit to/from Stand Sit to Stand: Supervision            General transfer comment: uses armrests on chairs for STS, CGA for safety. 1 instance of small LOB (able to control descent down in chair without assist from therapist)     Balance Overall balance assessment: Needs assistance Sitting-balance support: Feet supported Sitting balance-Leahy Scale: Good     Standing balance support: No upper extremity supported, Single extremity supported Standing balance-Leahy Scale: Fair Standing balance comment: 1UE support while marching in place                           ADL either performed or assessed with clinical judgement   ADL Overall ADL's : Needs assistance/impaired                                       General ADL Comments: Pt declines ADL performance as he requests to work on Management consultant Communication: No apparent difficulties Factors Affecting Communication:  (speaks at low volume)   Cognition Arousal: Alert Behavior During Therapy: WFL for tasks assessed/performed Cognition: No apparent impairments                               Following commands: Intact        Cueing   Cueing Techniques: Verbal cues  Exercises Exercises: Other exercises General Exercises - Lower Extremity Long Arc Quad: 20 reps, Seated, Strengthening, Both Hip Flexion/Marching: Standing, 20 reps, Strengthening, Both Other Exercises Other Exercises: 3x bouts of standing marches with OT  led breaks based on WOB    Shoulder Instructions       General Comments Pt on 9L/min via [place]. SpO2 drops to 90% with activity, improves to 95%> with PLB and rest breaks. Weaned O2 down to 7L/min end of session, SpO2 97%. RN notified.    Pertinent Vitals/ Pain       Pain Assessment Pain Assessment: No/denies pain         Frequency  Min 3X/week        Progress Toward Goals  OT Goals(current goals can now be found in the care plan section)  Progress towards OT goals: Progressing toward  goals  Acute Rehab OT Goals OT Goal Formulation: With patient/family Time For Goal Achievement: 03/09/24 Potential to Achieve Goals: Good ADL Goals Pt Will Perform Grooming: with modified independence;standing Pt Will Perform Lower Body Dressing: with modified independence;sit to/from stand Pt Will Transfer to Toilet: with modified independence;ambulating Pt Will Perform Toileting - Clothing Manipulation and hygiene: with modified independence;sit to/from stand  Plan         AM-PAC OT "6 Clicks" Daily Activity     Outcome Measure   Help from another person eating meals?: None Help from another person taking care of personal grooming?: None Help from another person toileting, which includes using toliet, bedpan, or urinal?: A Little Help from another person bathing (including washing, rinsing, drying)?: A Little Help from another person to put on and taking off regular upper body clothing?: A Little Help from another person to put on and taking off regular lower body clothing?: A Little 6 Click Score: 20    End of Session Equipment Utilized During Treatment: Oxygen   OT Visit Diagnosis: Unsteadiness on feet (R26.81);Repeated falls (R29.6);Muscle weakness (generalized) (M62.81)   Activity Tolerance Patient tolerated treatment well   Patient Left in bed;with call bell/phone within reach;with family/visitor present;with nursing/sitter in room (RN cleared for no chair alarm, XRAY staff present)   Nurse Communication Other (comment) (O2 levels, chair alarm)        Time: 5284-1324 OT Time Calculation (min): 25 min  Charges: OT General Charges $OT Visit: 1 Visit OT Treatments $Therapeutic Activity: 23-37 mins  Tudor Chandley L. Taite Baldassari, OTR/L  02/29/24, 3:37 PM

## 2024-02-29 NOTE — Progress Notes (Signed)
 Physical Therapy Treatment Patient Details Name: Dillon [name] MRN: [id number] DOB: 1956/01/18 Today's Date: 02/29/2024   History of Present Illness 68 y.o. male with medical history significant of chronic respiratory failure on 4-5 L, COPD, GERD, pulmonary fibrosis, restless leg syndrome and coronary artery disease presenting with acute on chronic respiratory failure with hypoxia and pneumothorax.  Patient reports acute onset of severe right-sided chest pain and shortness of breath earlier this morning.  Patient denies any recent trauma.  Patient states that he did have some similar pain on the posterior chest and right back last week associated with pulling shirt off with some excessive exertion per the patient.  Baseline pulmonary fibrosis and COPD.  No reported wheezing and cough.  No focal hemiparesis or confusion.  No abdominal pain.  Positive mild right-sided chest pain with deep breathing and movement.  Has been compliant otherwise with home respiratory regimen.  Follows Dr. Jamal Mays outpatient pulmonology.  Presented to the ER afebrile, heart rate into the 100s, initially requiring BiPAP.  Noted moderate to large right-sided pneumothorax on chest x-ray.  Chest tube with pigtail catheter placed by ER physician.  With improvement in respiratory status back to 4 to 5 L nasal cannula with O2 sats in the mid to upper 90s.  White count 13.2, hemoglobin 15.7, platelets 192, positive decompensated respiratory acidosis initially on VBG.  Creatinine 0.75.  Glucose 185.  Troponin 13.  BNP within normal limits.  Follow-up chest x-ray with small to moderate pneumothorax.    PT Comments  Pt seen for PT tx with wife & daughter present for session, pt pleasant & agreeable. Pt is able to complete bed mobility with mod I, sit<>stand from EOB & recliner with supervision. Pt engages in standing marching in place <30 seconds, ~1 minute, ~1 minute with 1UE support for balance. Pt requires seated rest breaks between each  trial to recover SpO2. PT reiterated pursed lip breathing, educated pt on importance of OOB mobility. Will continue to follow pt acutely to progress mobility as able.    If plan is discharge home, recommend the following: A little help with walking and/or transfers;A little help with bathing/dressing/bathroom;Assistance with cooking/housework;Assist for transportation;Help with stairs or ramp for entrance   Can travel by private vehicle     Yes  Equipment Recommendations  Other (comment) (?rollator)    Recommendations for Other Services       Precautions / Restrictions Precautions Precautions: Fall Precaution/Restrictions Comments: Right chest tube to suction Restrictions Weight Bearing Restrictions Per Provider Order: No     Mobility  Bed Mobility Overal bed mobility: Needs Assistance Bed Mobility: Supine to Sit     Supine to sit: Modified independent (Device/Increase time), HOB elevated, Used rails (exit R side of bed)          Transfers Overall transfer level: Needs assistance Equipment used: None Transfers: Sit to/from Stand, Bed to chair/wheelchair/BSC Sit to Stand: Supervision   Step pivot transfers: Contact guard assist (bed>recliner on R)       General transfer comment: sit<>stand from EOB, recliner, reaches back for armrests during stand>sit    Ambulation/Gait                   Stairs             Wheelchair Mobility     Tilt Bed    Modified Rankin (Stroke Patients Only)       Balance Overall balance assessment: Needs assistance Sitting-balance support: Feet supported Sitting balance-Leahy Scale: Good  Standing balance support: No upper extremity supported, Single extremity supported Standing balance-Leahy Scale: Fair Standing balance comment: 1UE support while marching in place                            Communication Communication Communication: No apparent difficulties Factors Affecting Communication:   (speaks at low volume)  Cognition Arousal: Alert Behavior During Therapy: WFL for tasks assessed/performed   PT - Cognitive impairments: No apparent impairments                       PT - Cognition Comments: labile during session when thinking about events/medical issues, PT providing encouragement, family present throughout session Following commands: Intact      Cueing Cueing Techniques: Verbal cues  Exercises      General Comments General comments (skin integrity, edema, etc.): Pt on 9L/min via nasal cannula, reiterated pursed lip breathing, SPO2 drops as low as 82% with activity, seated rest break & pursed lip breathing to recover to 90% or > within ~1 minute.      Pertinent Vitals/Pain Pain Assessment Pain Assessment: No/denies pain    Home Living                          Prior Function            PT Goals (current goals can now be found in the care plan section) Acute Rehab PT Goals Patient Stated Goal: go home PT Goal Formulation: With patient/family Time For Goal Achievement: 03/08/24 Potential to Achieve Goals: Good Progress towards PT goals: Progressing toward goals    Frequency    Min 2X/week      PT Plan      Co-evaluation              AM-PAC PT "6 Clicks" Mobility   Outcome Measure  Help needed turning from your back to your side while in a flat bed without using bedrails?: None Help needed moving from lying on your back to sitting on the side of a flat bed without using bedrails?: A Little Help needed moving to and from a bed to a chair (including a wheelchair)?: A Little Help needed standing up from a chair using your arms (e.g., wheelchair or bedside chair)?: A Little Help needed to walk in hospital room?: A Little Help needed climbing 3-5 steps with a railing? : A Little 6 Click Score: 19    End of Session Equipment Utilized During Treatment: Oxygen  Activity Tolerance: Patient tolerated treatment well Patient  left: in chair;with family/visitor present;with call bell/phone within reach Nurse Communication: Mobility status PT Visit Diagnosis: Muscle weakness (generalized) (M62.81);Unsteadiness on feet (R26.81)     Time: 1610-9604 PT Time Calculation (min) (ACUTE ONLY): 21 min  Charges:    $Therapeutic Activity: 8-22 mins PT General Charges $$ ACUTE PT VISIT: 1 Visit                     Emaline Handsome, PT, DPT 02/29/24, 2:59 PM   Venetta Gill 02/29/2024, 2:58 PM

## 2024-03-01 ENCOUNTER — Inpatient Hospital Stay

## 2024-03-01 DIAGNOSIS — J939 Pneumothorax, unspecified: Secondary | ICD-10-CM | POA: Diagnosis not present

## 2024-03-01 NOTE — Progress Notes (Signed)
 PULMONOLOGY         Date: 03/01/2024,   MRN# [id number] Dillon [name] [facility] 1956/10/15     Admission                  Current   Referring provider: Dr Daisey Dryer   CHIEF COMPLAINT:   Pneumothorax   HISTORY OF PRESENT ILLNESS   Dillon [name] is a 68 y.o. male with medical history significant of chronic respiratory failure on 4-5 L, COPD, GERD, pulmonary fibrosis, restless leg syndrome and coronary artery disease presenting with acute on chronic respiratory failure with hypoxia and pneumothorax.  [name] reports acute onset of severe right-sided chest pain and shortness of breath earlier this morning.  He reports pain started when he was getting prepared for work on Tuesday and as he was taking shirt off to get into shower he felt severe pain suddenly. No focal hemiparesis or confusion.  No abdominal pain.  Positive mild right-sided chest pain with deep breathing and movement.  Has been compliant otherwise with home respiratory regimen.   Presented to [facility] ER afebrile, heart rate into Dillon 100s, initially requiring BiPAP.  Noted moderate to large right-sided pneumothorax on chest x-ray.  Chest tube with pigtail catheter placed by ER physician. [name] is now improved but still has some residual pain on right hemithorax. PCCM consultation for further management of secondary spontaneous pneumothorax.   02/26/24- [name] with resolution of pneumothorax on cxr.  Will clamp tube overnight and repeat cxr in am for preparation to remove chest tube.     02/27/24- [name] with no overnight events.  I have clamped chest tube and we will repeat cxr in am in preparation for chest tube removal.    02/28/24- [name] with recurrence of pneumothorax overnight after clamping [facility] chest tube overnight. We have repeated cxr this am and then re-opened chest tube to suction.  Ive discussed with [name] plan to keep chest tube open to suction.  There is no air leak noted on chest tube management.   02/29/24-  [name] improved and is sitting in chair OOB.  Daughter and wife at bedside. He still has + air leak on chest tube evaluation and requires suction to atrium.   03/01/24- [name] seen at bedside.  Stable on 7L/min [place].  Plan to clamp chest tube today and re-evaluate in am  PAST MEDICAL HISTORY   Past Medical History:  Diagnosis Date   Chronic cough    COPD (chronic obstructive pulmonary disease) (HCC)    Coronary artery disease    Edentulous    Elevated cholesterol    GERD (gastroesophageal reflux disease)    Myocardial infarction (HCC)    Pulmonary fibrosis (HCC)    Restless leg syndrome    Supplemental oxygen  dependent      SURGICAL HISTORY   Past Surgical History:  Procedure Laterality Date   ANGIOPLASTY  2010   2 stents placed   BACK SURGERY     cardiac stents     x2   CATARACT EXTRACTION W/PHACO Left 08/20/2023   Procedure: CATARACT EXTRACTION PHACO AND INTRAOCULAR LENS PLACEMENT (IOC) LEFT 16.87 01:21.8;  Surgeon: Trudi Fus, MD;  Location: [facility];  Service: Ophthalmology;  Laterality: Left;   CATARACT EXTRACTION W/PHACO Right 09/03/2023   Procedure: CATARACT EXTRACTION PHACO AND INTRAOCULAR LENS PLACEMENT (IOC) RIGHT 19.41 01:30.0;  Surgeon: Trudi Fus, MD;  Location: [facility];  Service: Ophthalmology;  Laterality: Right;   COLONOSCOPY     COLONOSCOPY  WITH PROPOFOL  N/A 03/05/2018   Procedure: COLONOSCOPY WITH PROPOFOL ;  Surgeon: Irby Mannan, MD;  Location: [facility] ENDOSCOPY;  Service: Endoscopy;  Laterality: N/A;   COLONOSCOPY WITH PROPOFOL  N/A 01/24/2022   Procedure: COLONOSCOPY WITH PROPOFOL ;  Surgeon: Luke Salaam, MD;  Location: [facility] END[facility]COPY;  Service: Gastroenterology;  Laterality: N/A;   CORONARY ANGIOPLASTY     CORONARY/GRAFT ACUTE MI REVASCULARIZATION N/A 09/30/2019   Procedure: Coronary/Graft Acute MI Revascularization;  Surgeon: Wenona Hamilton, MD;  Location: [facility] INV[facility];  Service: Cardiovascular;   Laterality: N/A;   LEFT HEART CATH AND CORONARY ANGIOGRAPHY N/A 09/30/2019   Procedure: LEFT HEART CATH AND CORONARY ANGIOGRAPHY;  Surgeon: Wenona Hamilton, MD;  Location: [facility] INV[facility] CV [facility];  Service: Cardiovascular;  Laterality: N/A;   LEFT HEART CATH AND CORS/GRAFTS ANGIOGRAPHY N/A 10/01/2021   Procedure: LEFT HEART CATH AND CORS/GRAFTS ANGIOGRAPHY;  Surgeon: Sammy Crisp, MD;  Location: [facility] INVAS[facility] CV LAB;  Service: Cardiovascular;  Laterality: N/A;     FAMILY HISTORY   Family History  Problem Relation Age of Onset   Heart disease Mother    Heart disease Father      SOCIAL HISTORY   Social History   Tobacco Use   Smoking status: Former    Current packs/day: 0.00    Average packs/day: 2.0 packs/day for 40.0 years (80.0 ttl pk-yrs)    Types: Cigarettes    Start date: 1    Quit date: 2013    Years since quitting: 12.3   Smokeless tobacco: Never  Vaping Use   Vaping status: Never Used  Substance Use Topics   Alcohol  use: Not Currently    Comment: occassionally - 1 beer/month or less   Drug use: Not Currently     MEDICATIONS    Home Medication:    Current Medication:  Current Facility-Administered Medications:    acetaminophen  (TYLENOL ) tablet 650 mg, 650 mg, Oral, Q6H PRN, Sheril Dines, MD, 650 mg at 03/01/24 0759   albuterol  (PROVENTIL ) (2.5 MG/3ML) 0.083% nebulizer solution 2.5 mg, 2.5 mg, Nebulization, BID, Ayiku, Bernard, MD, 2.5 mg at 03/01/24 6962   atorvastatin  (LIPITOR ) tablet 40 mg, 40 mg, Oral, Daily, Sheril Dines, MD, 40 mg at 03/01/24 1019   budeson-glycopyrrolate -formoterol  (BREZTRI ) 160-9-4.8 MCG/ACT inhaler 2 puff, 2 puff, Inhalation, BID, Sheril Dines, MD, 2 puff at 03/01/24 0830   carvedilol  (COREG ) tablet 6.25 mg, 6.25 mg, Oral, BID WC, Sheril Dines, MD, 6.25 mg at 03/01/24 0759   enoxaparin  (LOVENOX ) injection 40 mg, 40 mg, Subcutaneous, Q24H, Corrinne Din, MD, 40 mg at 02/29/24 2128   HYDROmorphone  (DILAUDID ) injection  1 mg, 1 mg, Intravenous, Q2H PRN, Corrinne Din, MD, 1 mg at 02/28/24 0911   ketorolac (TORADOL) 30 MG/ML injection 15 mg, 15 mg, Intravenous, Once, [place], Phoebe Breed, NP   ondansetron  (ZOFRAN ) tablet 4 mg, 4 mg, Oral, Q6H PRN **OR** ondansetron  (ZOFRAN ) injection 4 mg, 4 mg, Intravenous, Q6H PRN, Corrinne Din, MD, 4 mg at 02/24/24 2257   pantoprazole  (PROTONIX ) EC tablet 40 mg, 40 mg, Oral, Daily, Ayiku, Bernard, MD, 40 mg at 03/01/24 1019   Pirfenidone  TABS 801 mg, 801 mg, Oral, TID AC, Ayiku, Bernard, MD, 801 mg at 03/01/24 1213   predniSONE  (DELTASONE ) tablet 20 mg, 20 mg, Oral, Daily, Ayiku, Bernard, MD, 20 mg at 03/01/24 1019   roflumilast  (DALIRESP ) tablet 500 mcg, 500 mcg, Oral, Daily, Ayiku, Bernard, MD, 500 mcg at 03/01/24 1018   Treprostinil  (TYVASO ) inhalation solution 18 mcg, 18 mcg, Inhalation, QID,  Sheril Dines, MD, 18 mcg at 03/01/24 1214    ALLERGIES   Ace inhibitors and Cinnamon     REVIEW OF SYSTEMS    Review of Systems:  Gen:  Denies  fever, sweats, chills weigh loss  HEENT: Denies blurred vision, double vision, ear pain, eye pain, hearing loss, nose bleeds, sore throat Cardiac:  No dizziness, chest pain or heaviness, chest tightness,edema Resp:   reports dyspnea chronically  Gi: Denies swallowing difficulty, stomach pain, nausea or vomiting, diarrhea, constipation, bowel incontinence Gu:  Denies bladder incontinence, burning urine Ext:   Denies Joint pain, stiffness or swelling Skin: Denies  skin rash, easy bruising or bleeding or hives Endoc:  Denies polyuria, polydipsia , polyphagia or weight change Psych:   Denies depression, insomnia or hallucinations   Other:  All other systems negative   VS: BP 121/76 (BP Location: Right Arm)   Pulse 74   Temp 98.1 F (36.7 C) (Oral)   Resp 18   SpO2 94%      PHYSICAL EXAM    GENERAL:NAD, no fevers, chills, no weakness no fatigue HEAD: Normocephalic, atraumatic.  EYES: Pupils equal, round,  reactive to light. Extraocular muscles intact. No scleral icterus.  MOUTH: Moist mucosal membrane. Dentition intact. No abscess noted.  EAR, NOSE, THROAT: Clear without exudates. No external lesions.  NECK: Supple. No thyromegaly. No nodules. No JVD.  PULMONARY: decreased breath sounds with mild rhonchi worse at bases bilaterally.  CARDIOVASCULAR: S1 and S2. Regular rate and rhythm. No murmurs, rubs, or gallops. No edema. Pedal pulses 2+ bilaterally.  GASTROINTESTINAL: Soft, nontender, nondistended. No masses. Positive bowel sounds. No hepatosplenomegaly.  MUSCULOSKELETAL: No swelling, clubbing, or edema. Range of motion full in all extremities.  NEUROLOGIC: Cranial nerves II through XII are intact. No gross focal neurological deficits. Sensation intact. Reflexes intact.  SKIN: No ulceration, lesions, rashes, or cyanosis. Skin warm and dry. Turgor intact.  PSYCHIATRIC: Mood, affect within normal limits. Dillon [name] is awake, alert and oriented x 3. Insight, judgment intact.       IMAGING  Reviewed CXR and CT with [name] and family at bedside  ASSESSMENT/PLAN   Secondary spontaneous pneumothorax                - chest tube on right - mild to moderate pain and tenderness             - cxr with residual moderate pneumothorax remaining    - continue chest tube to negative 20 cm H20    -repeat CXR - chest tube management - +air leak   2. Advanced COPD           Continue nebulizer therapy          Continue abx  3. Bibasilar atelectasis             IS at bedside   4. Physical deconditioning       PT/OT when able             Thank you for allowing me to participate in [facility] care of this [name].   [name]/Family are satisfied with care plan and all questions have been answered.    Provider disclosure: [name] with at least one acute or chronic illness or injury that poses a threat to life or bodily function and is being managed actively during this encounter.  All of Dillon  below services have been performed independently by signing provider:  review of prior documentation from internal and or external health records.  Review of previous and current lab results.  Interview and comprehensive assessment during [name] visit today. Review of current and previous chest radiographs/CT scans. Discussion of management and test interpretation with health care team and [name]/family.   This document was prepared using Dragon voice recognition software and may include unintentional dictation errors.     Byrne Capek, M.D.  Division of Pulmonary & Critical Care Medicine

## 2024-03-01 NOTE — Progress Notes (Addendum)
 Progress Note    Dillon [name]  ZOX:[id number] DOB: 1955-10-30  DOA: 02/24/2024 PCP: Pcp, No      Brief Narrative:    Medical records reviewed and are as summarized below:  Dillon [name] is a 68 y.o. male with medical history significant for COPD, pulmonary fibrosis, chronic hypoxic respiratory failure on 5 L/min oxygen , restless leg syndrome, GERD, hypertension, CAD, who presented to the hospital with acute onset of right-sided chest pain and shortness of breath.  He was found to have large right sided pneumothorax complicated by acute on chronic hypoxic respiratory failure.  He initially required BiPAP in the ED for acute respiratory failure. Chest tube was inserted in the right pleural space in the emergency room on 02/24/2024.    Assessment/Plan:   Principal Problem:   Pneumothorax Active Problems:   Acute on chronic hypoxic respiratory failure (HCC)   Coronary artery disease due to lipid rich plaque   Essential hypertension   Pulmonary fibrosis (HCC)   GERD without esophagitis   Chronic heart failure with preserved ejection fraction (HFpEF) (HCC)    Recurrent right-sided pneumothorax: Repeat chest x-ray on 02/29/2024 shows improvement in pneumothorax.  Continue chest tube to suction.   Of note, chest x-ray on 02/26/2024 which showed resolution of pneumothorax. S/p right-sided chest tube placement on 02/24/2024.  Analgesics as needed for pain.  Follow-up with pulmonologist.   Acute on chronic hypoxic respiratory failure: Continue 7 L oxygen .  Continue to taper down oxygen  as able. He uses 5 L/min oxygen  at baseline. Initially required BiPAP in the ED.   COPD, pulmonary fibrosis: Continue prednisone , treprostinil , pirfenidone , Roflumilast  and bronchodilators   Chronic HFpEF: Compensated   Troponins: Troponins peaked at 140.  This is likely from demand ischemia from respiratory failure.      Diet Order             Diet heart healthy/carb modified Room  service appropriate? Yes; Fluid consistency: Thin  Diet effective now                            Consultants: Pulmonologist  Procedures: Right-sided chest tube placement in the ER on 02/24/2024    Medications:    albuterol   2.5 mg Nebulization BID   atorvastatin   40 mg Oral Daily   budeson-glycopyrrolate -formoterol   2 puff Inhalation BID   carvedilol   6.25 mg Oral BID WC   enoxaparin  (LOVENOX ) injection  40 mg Subcutaneous Q24H   ketorolac  15 mg Intravenous Once   pantoprazole   40 mg Oral Daily   Pirfenidone   801 mg Oral TID AC   predniSONE   20 mg Oral Daily   roflumilast   500 mcg Oral Daily   Treprostinil   18 mcg Inhalation QID   Continuous Infusions:   Anti-infectives (From admission, onward)    None              Family Communication/Anticipated D/C date and plan/Code Status   DVT prophylaxis: enoxaparin  (LOVENOX ) injection 40 mg Start: 02/24/24 2200     Code Status: Full Code  Family Communication: None Disposition Plan: Plan to discharge home   Status is: Inpatient Remains inpatient appropriate because: Right-sided pneumothorax       Subjective:   Interval events noted.  He had worsening chest pain last night but he feels better now.  No shortness of breath.  Objective:    Vitals:   03/01/24 0403 03/01/24 0750 03/01/24 0934 03/01/24 1204  BP: 129/82  115/84 121/76  Pulse: 65  76 74  Resp: 18  16 18   Temp: 97.6 F (36.4 C)  (!) 97.3 F (36.3 C) 98.1 F (36.7 C)  TempSrc: Oral   Oral  SpO2: 96% 96% 94% 94%   No data found.   Intake/Output Summary (Last 24 hours) at 03/01/2024 1230 Last data filed at 03/01/2024 0600 Gross per 24 hour  Intake 120 ml  Output 305 ml  Net -185 ml   There were no vitals filed for this visit.  Exam:  GEN: NAD SKIN: Warm and dry EYES: No pallor or icterus ENT: MMM CV: RRR PULM: Fine bibasilar rales.  No wheezing.  Chest tube in right chest wall connected to suction ABD: soft,  ND, NT, +BS CNS: AAO x 3, non focal EXT: No edema or tenderness        Data Reviewed:   I have personally reviewed following labs and imaging studies:  Labs: Labs show the following:   Basic Metabolic Panel: Recent Labs  Lab 02/24/24 0650 02/25/24 0217  NA 136 135  K 4.0 4.7  CL 101 101  CO2 23 27  GLUCOSE 185* 143*  BUN 17 17  CREATININE 0.75 0.62  CALCIUM  9.0 9.0   GFR Estimated Creatinine Clearance: 104.2 mL/min (by C-G formula based on SCr of 0.62 mg/dL). Liver Function Tests: Recent Labs  Lab 02/24/24 0650 02/25/24 0217  AST 30 29  ALT 40 37  ALKPHOS 59 50  BILITOT 1.0 0.8  PROT 7.5 6.5  ALBUMIN 3.7 3.4*   No results for input(s): "LIPASE", "AMYLASE" in the last 168 hours. No results for input(s): "AMMONIA" in the last 168 hours. Coagulation profile No results for input(s): "INR", "PROTIME" in the last 168 hours.  CBC: Recent Labs  Lab 02/24/24 0650 02/25/24 0217  WBC 13.2* 12.7*  NEUTROABS 6.6  --   HGB 15.7 14.4  HCT 46.3 41.2  MCV 104.5* 101.7*  PLT 192 143*   Cardiac Enzymes: No results for input(s): "CKTOTAL", "CKMB", "CKMBINDEX", "TROPONINI" in the last 168 hours. BNP (last 3 results) No results for input(s): "PROBNP" in the last 8760 hours. CBG: Recent Labs  Lab 02/25/24 2154  GLUCAP 117*   D-Dimer: No results for input(s): "DDIMER" in the last 72 hours. Hgb A1c: No results for input(s): "HGBA1C" in the last 72 hours. Lipid Profile: No results for input(s): "CHOL", "HDL", "LDLCALC", "TRIG", "CHOLHDL", "LDLDIRECT" in the last 72 hours. Thyroid function studies: No results for input(s): "TSH", "T4TOTAL", "T3FREE", "THYROIDAB" in the last 72 hours.  Invalid input(s): "FREET3" Anemia work up: No results for input(s): "VITAMINB12", "FOLATE", "FERRITIN", "TIBC", "IRON", "RETICCTPCT" in the last 72 hours. Sepsis Labs: Recent Labs  Lab 02/24/24 0650 02/25/24 0217  WBC 13.2* 12.7*    Microbiology No results found for this  or any previous visit (from the past 240 hours).  Procedures and diagnostic studies:  DG Chest Port 1 View Result Date: 02/29/2024 CLINICAL DATA:  Follow up pneumothorax. EXAM: PORTABLE CHEST 1 VIEW COMPARISON:  Radiographs 02/28/2024 and 02/26/2024.  CT 02/17/2024. FINDINGS: 1521 hours. Right pigtail chest tube is unchanged in position. Small residual right apical pneumothorax is unchanged. There is stable soft tissue emphysema within the right chest wall. The heart size and mediastinal contours are stable. Chronic fibrotic lung disease again noted without evidence of acute superimposed airspace disease. No significant pleural effusion. IMPRESSION: 1. Stable small residual right apical pneumothorax with chest tube in place. 2. Chronic fibrotic lung disease. No  acute cardiopulmonary process. Electronically Signed   By: Elmon Hagedorn M.D.   On: 02/29/2024 19:17   DG Chest Port 1 View Result Date: 02/28/2024 CLINICAL DATA:  Pneumothorax. EXAM: PORTABLE CHEST 1 VIEW COMPARISON:  Radiograph earlier today FINDINGS: Decreased right pneumothorax. Small residual persists at the apex. Pigtail catheter in the lateral chest wall coiled laterally. Underlying chronic lung disease with diffuse interstitial coarsening and subpleural reticulation. Stable heart size and mediastinal contours. No large pleural effusion. IMPRESSION: 1. Decreased right pneumothorax with small residual at the apex. Pigtail catheter in the lateral chest wall coiled laterally. 2. Underlying chronic lung disease. Electronically Signed   By: Chadwick Colonel M.D.   On: 02/28/2024 14:57               LOS: 6 days   Amritha Yorke  [facility]   Pager on [email or web address]. If 7PM-7AM, please contact night-coverage at [email or web address]     03/01/2024, 12:30 PM

## 2024-03-01 NOTE — Plan of Care (Signed)

## 2024-03-01 NOTE — Progress Notes (Signed)
 Physical Therapy Treatment Patient Details Name: Dillon [name] MRN: [id number] DOB: 01-06-1956 Today's Date: 03/01/2024   History of Present Illness 68 y.o. male with medical history significant of chronic respiratory failure on 4-5 L, COPD, GERD, pulmonary fibrosis, restless leg syndrome and coronary artery disease presenting with acute on chronic respiratory failure with hypoxia and pneumothorax s/p chest tube placement 5/7    PT Comments  OOB and transfers to recliner with supervision and assist for chest tube.  He stands x 2 during session for standing ex with 1 UE support for comfort.  Long discussion about energy conservation and general awareness of O2 sats.  Pt did decrease to 78% with standing ex but returned to baseline of low 90's with seated rest.  Pt overall does well with mobility.  Anticipate pt would benefit from rollator walker upon discharge for energy conservation, O2 management needs and access to sitting with mobility given balance and overall mobility status.  Will continue to monitor and adjust as appropriate.    If plan is discharge home, recommend the following: A little help with walking and/or transfers;A little help with bathing/dressing/bathroom;Assistance with cooking/housework;Assist for transportation;Help with stairs or ramp for entrance   Can travel by private vehicle     Yes  Equipment Recommendations  Other (comment);Rollator (4 wheels) (?rollator)    Recommendations for Other Services       Precautions / Restrictions Precautions Precautions: Fall Precaution/Restrictions Comments: Right chest tube to suction Restrictions Weight Bearing Restrictions Per Provider Order: No     Mobility  Bed Mobility Overal bed mobility: Modified Independent               Patient Response: Cooperative  Transfers Overall transfer level: Modified independent Equipment used: None Transfers: Sit to/from Stand Sit to Stand: Modified independent  (Device/Increase time)                Ambulation/Gait Ambulation/Gait assistance: Contact guard assist Gait Distance (Feet): 3 Feet Assistive device: None Gait Pattern/deviations: Step-to pattern       General Gait Details: transfers to chair with supervision.  limited due to wall suction.   Stairs             Wheelchair Mobility     Tilt Bed Tilt Bed Patient Response: Cooperative  Modified Rankin (Stroke Patients Only)       Balance Overall balance assessment: Needs assistance Sitting-balance support: Feet supported Sitting balance-Leahy Scale: Normal     Standing balance support: No upper extremity supported Standing balance-Leahy Scale: Fair Standing balance comment: presers HHA for marches/ex in standing                            Communication Communication Communication: No apparent difficulties  Cognition Arousal: Alert Behavior During Therapy: WFL for tasks assessed/performed   PT - Cognitive impairments: No apparent impairments                         Following commands: Intact      Cueing Cueing Techniques: Verbal cues  Exercises      General Comments        Pertinent Vitals/Pain Pain Assessment Pain Assessment: No/denies pain    Home Living                          Prior Function            PT  Goals (current goals can now be found in the care plan section) Progress towards PT goals: Progressing toward goals    Frequency    Min 2X/week      PT Plan      Co-evaluation              AM-PAC PT "6 Clicks" Mobility   Outcome Measure  Help needed turning from your back to your side while in a flat bed without using bedrails?: None Help needed moving from lying on your back to sitting on the side of a flat bed without using bedrails?: A Little Help needed moving to and from a bed to a chair (including a wheelchair)?: A Little Help needed standing up from a chair using your arms  (e.g., wheelchair or bedside chair)?: A Little Help needed to walk in hospital room?: A Little Help needed climbing 3-5 steps with a railing? : A Little 6 Click Score: 19    End of Session Equipment Utilized During Treatment: Oxygen  Activity Tolerance: Patient tolerated treatment well Patient left: in chair;with call bell/phone within reach Nurse Communication: Mobility status PT Visit Diagnosis: Muscle weakness (generalized) (M62.81);Unsteadiness on feet (R26.81)     Time: 7829-5621 PT Time Calculation (min) (ACUTE ONLY): 21 min  Charges:    $Therapeutic Activity: 8-22 mins PT General Charges $$ ACUTE PT VISIT: 1 Visit                    Dillon [name], PTA 03/01/24, 9:21 AM

## 2024-03-02 DIAGNOSIS — J8410 Pulmonary fibrosis, unspecified: Secondary | ICD-10-CM

## 2024-03-02 DIAGNOSIS — J9621 Acute and chronic respiratory failure with hypoxia: Secondary | ICD-10-CM | POA: Diagnosis not present

## 2024-03-02 DIAGNOSIS — J9311 Primary spontaneous pneumothorax: Secondary | ICD-10-CM

## 2024-03-02 LAB — CBC
HCT: 40.8 % (ref 39.0–52.0)
Hemoglobin: 14.3 g/dL (ref 13.0–17.0)
MCH: 35 pg — ABNORMAL HIGH (ref 26.0–34.0)
MCHC: 35 g/dL (ref 30.0–36.0)
MCV: 100 fL (ref 80.0–100.0)
Platelets: 146 10*3/uL — ABNORMAL LOW (ref 150–400)
RBC: 4.08 MIL/uL — ABNORMAL LOW (ref 4.22–5.81)
RDW: 13.5 % (ref 11.5–15.5)
WBC: 8.2 10*3/uL (ref 4.0–10.5)
nRBC: 0 % (ref 0.0–0.2)

## 2024-03-02 LAB — BASIC METABOLIC PANEL WITH GFR
Anion gap: 6 (ref 5–15)
BUN: 21 mg/dL (ref 8–23)
CO2: 27 mmol/L (ref 22–32)
Calcium: 9 mg/dL (ref 8.9–10.3)
Chloride: 103 mmol/L (ref 98–111)
Creatinine, Ser: 0.64 mg/dL (ref 0.61–1.24)
GFR, Estimated: >60 mL/min (ref >60)
Glucose, Bld: 86 mg/dL (ref 70–99)
Potassium: 4.1 mmol/L (ref 3.5–5.1)
Sodium: 136 mmol/L (ref 135–145)

## 2024-03-02 NOTE — Hospital Course (Signed)
 Dillon [name] is a 68 y.o. male with medical history significant for COPD, pulmonary fibrosis, chronic hypoxic respiratory failure on 5 L/min oxygen , restless leg syndrome, GERD, hypertension, CAD, who presented to the hospital with acute onset of right-sided chest pain and shortness of breath.   He was found to have large right sided pneumothorax complicated by acute on chronic hypoxic respiratory failure.   He initially required BiPAP in the ED for acute respiratory failure. Chest tube was inserted in the right pleural space in the emergency room on 02/24/2024.

## 2024-03-02 NOTE — TOC Progression Note (Signed)
 Transition of Care [facility]) - Progression Note    Patient Details  Name: Dillon [name] MRN: [id number] Date of Birth: [birth year]  Transition of Care [facility]) CM/SW Contact  Crayton Docker, RN 03/02/2024, 10:44 AM  Clinical Narrative:     Secure message received from Sam Creighton, [employer]; patient refused rollator.  CM to patient's room regarding home health PT recommendations. Patient sitting in recliner, patient's wife in room. Patient declined home health recommendations. Patient stated has home oxygen  through [employer].  Expected Discharge Plan:  (Unknown at this time.  SNF/HH  recommended to patient.) Barriers to Discharge: Continued Medical Work up  Expected Discharge Plan and Services       Living arrangements for the past 2 months: Single Family Home   Social Determinants of Health (SDOH) Interventions SDOH Screenings   Food Insecurity: No Food Insecurity (02/24/2024)  Housing: Low Risk  (02/24/2024)  Transportation Needs: No Transportation Needs (02/24/2024)  Utilities: Not At Risk (02/24/2024)  Depression (PHQ2-9): Low Risk  (12/23/2021)  Financial Resource Strain: Low Risk  (01/21/2024)   Received from [facility]  Social Connections: Socially Integrated (02/24/2024)  Tobacco Use: Medium Risk (02/24/2024)    Readmission Risk Interventions     No data to display

## 2024-03-02 NOTE — Progress Notes (Signed)
  Progress Note   Patient: Dillon [name] ZOX:[id number] DOB: Feb 25, 1956 DOA: 02/24/2024     7 DOS: the patient was seen and examined on 03/02/2024   Brief hospital course: Dillon [name] is a 68 y.o. male with medical history significant for COPD, pulmonary fibrosis, chronic hypoxic respiratory failure on 5 L/min oxygen , restless leg syndrome, GERD, hypertension, CAD, who presented to the hospital with acute onset of right-sided chest pain and shortness of breath.   He was found to have large right sided pneumothorax complicated by acute on chronic hypoxic respiratory failure.   He initially required BiPAP in the ED for acute respiratory failure. Chest tube was inserted in the right pleural space in the emergency room on 02/24/2024.   Principal Problem:   Pneumothorax Active Problems:   Acute on chronic hypoxic respiratory failure (HCC)   Coronary artery disease due to lipid rich plaque   Essential hypertension   Pulmonary fibrosis (HCC)   GERD without esophagitis   Chronic heart failure with preserved ejection fraction (HFpEF) (HCC)   Assessment and Plan: Acute on chronic hypoxic respiratory failure (HCC) Pneumothorax Acute decompensated respiratory status initially requiring BiPAP  in the setting of spontaneous large pneumothorax on chest x-ray with chronic pulmonary fibrosis and COPD Status post chest tube and pigtail catheter in the ER Patient followed by pulmonology.  Still requiring chest tube.  Repeated chest x-ray yesterday had a slightly increased pneumothorax after clamping of chest tube. Will continue to follow closely.  Pulmonary fibrosis. COPD. Condition stable, still on home dose of prednisone .  Coronary artery disease due to lipid rich plaque Condition stable.  Essential hypertension BP stable Titrate home regimen  Chronic heart failure with preserved ejection fraction (HFpEF) (HCC) 2D echo January 2025 with EF of 55 to 60% and grade 1 diastolic  dysfunction Appears euvolemic at present Patient is euvolemic.  GERD without esophagitis PPI         Subjective:  Patient feels well today, no short of breath or chest pain.  Physical Exam: Vitals:   03/02/24 0427 03/02/24 0737 03/02/24 0805 03/02/24 1152  BP: 129/74  124/73 117/84  Pulse: 68  80 79  Resp: 18  18 17   Temp: 98.4 F (36.9 C)  98 F (36.7 C) 98.2 F (36.8 C)  TempSrc:      SpO2: 97% 92% 92% 94%   General exam: Appears calm and comfortable  Respiratory system: Decreased breath sounds. Respiratory effort normal. Cardiovascular system: S1 & S2 heard, RRR. No JVD, murmurs, rubs, gallops or clicks. No pedal edema. Gastrointestinal system: Abdomen is nondistended, soft and nontender. No organomegaly or masses felt. Normal bowel sounds heard. Central nervous system: Alert and oriented. No focal neurological deficits. Extremities: Symmetric 5 x 5 power. Skin: No rashes, lesions or ulcers Psychiatry: Judgement and insight appear normal. Mood & affect appropriate. '  Data Reviewed:  Chest x-ray and the lab results reviewed.  Family Communication: Wife updated at bedside.  Disposition: Status is: Inpatient Remains inpatient appropriate because: Severity of disease, inpatient procedure.     Time spent: 35 minutes  Author: Donaciano Frizzle, MD 03/02/2024 1:30 PM  For on call review [email or web address].

## 2024-03-02 NOTE — Progress Notes (Signed)
 PULMONOLOGY         Date: 03/02/2024,   MRN# [id number] Garett [facility]ippins [facility] 1956-08-27     Admission                  Current   Referring provider: Dr Daisey Dryer   CHIEF COMPLAINT:   Pneumothorax   HISTORY OF PRESENT ILLNESS   Dillon [name] is a 68 y.o. male with medical history significant of chronic respiratory failure on 4-5 L, COPD, GERD, pulmonary fibrosis, restless leg syndrome and coronary artery disease presenting with acute on chronic respiratory failure with hypoxia and pneumothorax.  Patient reports acute onset of severe right-sided chest pain and shortness of breath earlier this morning.  He reports pain started when he was getting prepared for work on Tuesday and as he was taking shirt off to get into shower he felt severe pain suddenly. No focal hemiparesis or confusion.  No abdominal pain.  Positive mild right-sided chest pain with deep breathing and movement.  Has been compliant otherwise with home respiratory regimen.   Presented to the ER afebrile, heart rate into the 100s, initially requiring BiPAP.  Noted moderate to large right-sided pneumothorax on chest x-ray.  Chest tube with pigtail catheter placed by ER physician. Patient is now improved but still has some residual pain on right hemithorax. PCCM consultation for further management of secondary spontaneous pneumothorax.   02/26/24- patient with resolution of pneumothorax on cxr.  Will clamp tube overnight and repeat cxr in am for preparation to remove chest tube.     02/27/24- patient with no overnight events.  I have clamped chest tube and we will repeat cxr in am in preparation for chest tube removal.    02/28/24- patient with recurrence of pneumothorax overnight after clamping the chest tube overnight. We have repeated cxr this am and then re-opened chest tube to suction.  Ive discussed with patient plan to keep chest tube open to suction.  There is no air leak noted on chest tube management.   02/29/24-  patient improved and is sitting in chair OOB.  Daughter and wife at bedside. He still has + air leak on chest tube evaluation and requires suction to atrium.   03/01/24- patient seen at bedside.  Stable on 7L/min [place].  Plan to clamp chest tube today and re-evaluate in am  PAST MEDICAL HISTORY   Past Medical History:  Diagnosis Date   Chronic cough    COPD (chronic obstructive pulmonary disease) (HCC)    Coronary artery disease    Edentulous    Elevated cholesterol    GERD (gastroesophageal reflux disease)    Myocardial infarction (HCC)    Pulmonary fibrosis (HCC)    Restless leg syndrome    Supplemental oxygen  dependent      SURGICAL HISTORY   Past Surgical History:  Procedure Laterality Date   ANGIOPLASTY  2010   2 stents placed   BACK SURGERY     cardiac stents     x2   CATARACT EXTRACTION W/PHACO Left 08/20/2023   Procedure: CATARACT EXTRACTION PHACO AND INTRAOCULAR LENS PLACEMENT (IOC) LEFT 16.87 01:21.8;  Surgeon: Trudi Fus, MD;  Location: [facility];  Service: Ophthalmology;  Laterality: Left;   CATARACT EXTRACTION W/PHACO Right 09/03/2023   Procedure: CATARACT EXTRACTION PHACO AND INTRAOCULAR LENS PLACEMENT (IOC) RIGHT 19.41 01:30.0;  Surgeon: Trudi Fus, MD;  Location: [facility];  Service: Ophthalmology;  Laterality: Right;   COLONOSCOPY     COLONOSCOPY  WITH PROPOFOL  N/A 03/05/2018   Procedure: COLONOSCOPY WITH PROPOFOL ;  Surgeon: Irby Mannan, MD;  Location: [facility];  Service: Endoscopy;  Laterality: N/A;   COLONOSCOPY WITH PROPOFOL  N/A 01/24/2022   Procedure: COLONOSCOPY WITH PROPOFOL ;  Surgeon: Luke Salaam, MD;  [facility];  Service: Gastroenterology;  Laterality: N/A;   CORONARY ANGIOPLASTY     CORONARY/GRAFT ACUTE MI REVASCULARIZATION N/A 09/30/2019   Procedure: Coronary/Graft Acute MI Revascularization;  Surgeon: Wenona Hamilton, MD;  Location: [facility];  Service: Cardiovascular;   Laterality: N/A;   LEFT HEART CATH AND CORONARY ANGIOGRAPHY N/A 09/30/2019   Procedure: LEFT HEART CATH AND CORONARY ANGIOGRAPHY;  Surgeon: Wenona Hamilton, MD;  Location: [facility];  Service: Cardiovascular;  Laterality: N/A;   LEFT HEART CATH AND CORS/GRAFTS ANGIOGRAPHY N/A 10/01/2021   Procedure: LEFT HEART CATH AND CORS/GRAFTS ANGIOGRAPHY;  Surgeon: Sammy Crisp, MD;  Location: [facility] CV [facility];  Service: Cardiovascular;  Laterality: N/A;     FAMILY HISTORY   Family History  Problem Relation Age of Onset   Heart disease Mother    Heart disease Father      SOCIAL HISTORY   Social History   Tobacco Use   Smoking status: Former    Current packs/day: 0.00    Average packs/day: 2.0 packs/day for 40.0 years (80.0 ttl pk-yrs)    Types: Cigarettes    Start date: 30    Quit date: 2013    Years since quitting: 12.3   Smokeless tobacco: Never  Vaping Use   Vaping status: Never Used  Substance Use Topics   Alcohol  use: Not Currently    Comment: occassionally - 1 beer/month or less   Drug use: Not Currently     MEDICATIONS    Home Medication:    Current Medication:  Current Facility-Administered Medications:    acetaminophen  (TYLENOL ) tablet 650 mg, 650 mg, Oral, Q6H PRN, Sheril Dines, MD, 650 mg at 03/02/24 1202   albuterol  (PROVENTIL ) (2.5 MG/3ML) 0.083% nebulizer solution 2.5 mg, 2.5 mg, Nebulization, BID, Ayiku, Bernard, MD, 2.5 mg at 03/02/24 8295   atorvastatin  (LIPITOR ) tablet 40 mg, 40 mg, Oral, Daily, Sheril Dines, MD, 40 mg at 03/02/24 6213   budeson-glycopyrrolate -formoterol  (BREZTRI ) 160-9-4.8 MCG/ACT inhaler 2 puff, 2 puff, Inhalation, BID, Sheril Dines, MD, 2 puff at 03/02/24 0825   carvedilol  (COREG ) tablet 6.25 mg, 6.25 mg, Oral, BID WC, Sheril Dines, MD, 6.25 mg at 03/02/24 0865   enoxaparin  (LOVENOX ) injection 40 mg, 40 mg, Subcutaneous, Q24H, Corrinne Din, MD, 40 mg at 03/01/24 2139   HYDROmorphone  (DILAUDID ) injection  1 mg, 1 mg, Intravenous, Q2H PRN, Corrinne Din, MD, 1 mg at 02/28/24 0911   ketorolac (TORADOL) 30 MG/ML injection 15 mg, 15 mg, Intravenous, Once, [place], Phoebe Breed, NP   ondansetron  (ZOFRAN ) tablet 4 mg, 4 mg, Oral, Q6H PRN **OR** ondansetron  (ZOFRAN ) injection 4 mg, 4 mg, Intravenous, Q6H PRN, Corrinne Din, MD, 4 mg at 02/24/24 2257   pantoprazole  (PROTONIX ) EC tablet 40 mg, 40 mg, Oral, Daily, Ayiku, Bernard, MD, 40 mg at 03/02/24 7846   Pirfenidone  TABS 801 mg, 801 mg, Oral, TID AC, Ayiku, Bernard, MD, 801 mg at 03/02/24 1154   predniSONE  (DELTASONE ) tablet 20 mg, 20 mg, Oral, Daily, Ayiku, Bernard, MD, 20 mg at 03/02/24 9629   roflumilast  (DALIRESP ) tablet 500 mcg, 500 mcg, Oral, Daily, Ayiku, Bernard, MD, 500 mcg at 03/02/24 0822   Treprostinil  (TYVASO ) inhalation solution 18 mcg, 18 mcg, Inhalation, QID,  Sheril Dines, MD, 18 mcg at 03/02/24 1610    ALLERGIES   Ace inhibitors and Cinnamon     REVIEW OF SYSTEMS    Review of Systems:  Gen:  Denies  fever, sweats, chills weigh loss  HEENT: Denies blurred vision, double vision, ear pain, eye pain, hearing loss, nose bleeds, sore throat Cardiac:  No dizziness, chest pain or heaviness, chest tightness,edema Resp:   reports dyspnea chronically  Gi: Denies swallowing difficulty, stomach pain, nausea or vomiting, diarrhea, constipation, bowel incontinence Gu:  Denies bladder incontinence, burning urine Ext:   Denies Joint pain, stiffness or swelling Skin: Denies  skin rash, easy bruising or bleeding or hives Endoc:  Denies polyuria, polydipsia , polyphagia or weight change Psych:   Denies depression, insomnia or hallucinations   Other:  All other systems negative   VS: BP 117/84 (BP Location: Right Arm)   Pulse 79   Temp 98.2 F (36.8 C)   Resp 17   SpO2 94%      PHYSICAL EXAM    GENERAL:NAD, no fevers, chills, no weakness no fatigue HEAD: Normocephalic, atraumatic.  EYES: Pupils equal, round, reactive  to light. Extraocular muscles intact. No scleral icterus.  MOUTH: Moist mucosal membrane. Dentition intact. No abscess noted.  EAR, NOSE, THROAT: Clear without exudates. No external lesions.  NECK: Supple. No thyromegaly. No nodules. No JVD.  PULMONARY: decreased breath sounds with mild rhonchi worse at bases bilaterally.  CARDIOVASCULAR: S1 and S2. Regular rate and rhythm. No murmurs, rubs, or gallops. No edema. Pedal pulses 2+ bilaterally.  GASTROINTESTINAL: Soft, nontender, nondistended. No masses. Positive bowel sounds. No hepatosplenomegaly.  MUSCULOSKELETAL: No swelling, clubbing, or edema. Range of motion full in all extremities.  NEUROLOGIC: Cranial nerves II through XII are intact. No gross focal neurological deficits. Sensation intact. Reflexes intact.  SKIN: No ulceration, lesions, rashes, or cyanosis. Skin warm and dry. Turgor intact.  PSYCHIATRIC: Mood, affect within normal limits. The patient is awake, alert and oriented x 3. Insight, judgment intact.       IMAGING  Reviewed CXR and CT with patient and family at bedside  ASSESSMENT/PLAN   Secondary spontaneous pneumothorax                - chest tube on right - mild to moderate pain and tenderness             - cxr with residual moderate pneumothorax remaining    - continue chest tube to negative 20 cm H20    -repeat CXR - chest tube management - +air leak   2. Advanced COPD           Continue nebulizer therapy          Continue abx  3. Bibasilar atelectasis             IS at bedside   4. Physical deconditioning       PT/OT when able             Thank you for allowing me to participate in the care of this patient.   Patient/Family are satisfied with care plan and all questions have been answered.    Provider disclosure: Patient with at least one acute or chronic illness or injury that poses a threat to life or bodily function and is being managed actively during this encounter.  All of the below  services have been performed independently by signing provider:  review of prior documentation from internal and or external health records.  Review  of previous and current lab results.  Interview and comprehensive assessment during patient visit today. Review of current and previous chest radiographs/CT scans. Discussion of management and test interpretation with health care team and patient/family.   This document was prepared using Dragon voice recognition software and may include unintentional dictation errors.     Izayah Miner, M.D.  Division of Pulmonary & Critical Care Medicine

## 2024-03-03 ENCOUNTER — Inpatient Hospital Stay

## 2024-03-03 DIAGNOSIS — J9621 Acute and chronic respiratory failure with hypoxia: Secondary | ICD-10-CM | POA: Diagnosis not present

## 2024-03-03 DIAGNOSIS — I5032 Chronic diastolic (congestive) heart failure: Secondary | ICD-10-CM

## 2024-03-03 DIAGNOSIS — J9311 Primary spontaneous pneumothorax: Secondary | ICD-10-CM | POA: Diagnosis not present

## 2024-03-03 NOTE — Progress Notes (Signed)
 PULMONOLOGY         Date: 03/03/2024,   MRN# [id number] [name] [birth year]     Admission                  Current   Referring provider: Dr Daisey Dryer   CHIEF COMPLAINT:   Pneumothorax   HISTORY OF PRESENT ILLNESS   Dillon [name] is a 68 y.o. male with medical history significant of chronic respiratory failure on 4-5 L, COPD, GERD, pulmonary fibrosis, restless leg syndrome and coronary artery disease presenting with acute on chronic respiratory failure with hypoxia and pneumothorax.  Patient reports acute onset of severe right-sided chest pain and shortness of breath earlier this morning.  He reports pain started when he was getting prepared for work on Tuesday and as he was taking shirt off to get into shower he felt severe pain suddenly. No focal hemiparesis or confusion.  No abdominal pain.  Positive mild right-sided chest pain with deep breathing and movement.  Has been compliant otherwise with home respiratory regimen.   Presented to the ER afebrile, heart rate into the 100s, initially requiring BiPAP.  Noted moderate to large right-sided pneumothorax on chest x-ray.  Chest tube with pigtail catheter placed by ER physician. Patient is now improved but still has some residual pain on right hemithorax. PCCM consultation for further management of secondary spontaneous pneumothorax.   02/26/24- patient with resolution of pneumothorax on cxr.  Will clamp tube overnight and repeat cxr in am for preparation to remove chest tube.     02/27/24- patient with no overnight events.  I have clamped chest tube and we will repeat cxr in am in preparation for chest tube removal.    02/28/24- patient with recurrence of pneumothorax overnight after clamping the chest tube overnight. We have repeated cxr this am and then re-opened chest tube to suction.  Ive discussed with patient plan to keep chest tube open to suction.  There is no air leak noted on chest tube management.   02/29/24-  patient improved and is sitting in chair OOB.  Daughter and wife at bedside. He still has + air leak on chest tube evaluation and requires suction to atrium.   03/01/24- patient seen at bedside.  Stable on 7L/min [place].  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 If fistula persists he may need thoracic surgery evaluation.   03/03/24- patient still has air leak this morning.  He feels anxious about closing off tube and I think its reasonable to give him more time to heal bronchopleural fistula.  He may need thoracic surgery evaluation.   PAST MEDICAL HISTORY   Past Medical History:  Diagnosis Date   Chronic cough    COPD (chronic obstructive pulmonary disease) (HCC)    Coronary artery disease    Edentulous    Elevated cholesterol    GERD (gastroesophageal reflux disease)    Myocardial infarction (HCC)    Pulmonary fibrosis (HCC)    Restless leg syndrome    Supplemental oxygen  dependent      SURGICAL HISTORY   Past Surgical History:  Procedure Laterality Date   ANGIOPLASTY  2010   2 stents placed   BACK SURGERY     cardiac stents     x2   CATARACT EXTRACTION W/PHACO Left 08/20/2023   Procedure: CATARACT EXTRACTION PHACO AND INTRAOCULAR LENS PLACEMENT (IOC) LEFT 16.87 01:21.8;  Surgeon: Trudi Fus, MD;  Location: [facility];  Service: Ophthalmology;  Laterality: Left;   CATARACT EXTRACTION W/PHACO Right 09/03/2023   Procedure: CATARACT EXTRACTION PHACO AND INTRAOCULAR LENS PLACEMENT (IOC) RIGHT 19.41 01:30.0;  Surgeon: Trudi Fus, MD;  Location: [facility];  Service: Ophthalmology;  Laterality: Right;   COLONOSCOPY     COLONOSCOPY WITH PROPOFOL  N/A 03/05/2018   Procedure: COLONOSCOPY WITH PROPOFOL ;  Surgeon: Irby Mannan, MD;  Location: [facility] ENDOSCOPY;  Service: Endoscopy;  Laterality: N/A;   COLONOSCOPY WITH PROPOFOL  N/A 01/24/2022    Procedure: COLONOSCOPY WITH PROPOFOL ;  Surgeon: Luke Salaam, MD;  Location: [facility];  Service: Gastroenterology;  Laterality: N/A;   CORONARY ANGIOPLASTY     CORONARY/GRAFT ACUTE MI REVASCULARIZATION N/A 09/30/2019   Procedure: Coronary/Graft Acute MI Revascularization;  Surgeon: Wenona Hamilton, MD;  Location: [facility] INVASIVE CV LAB;  Service: Cardiovascular;  Laterality: N/A;   LEFT HEART CATH AND CORONARY ANGIOGRAPHY N/A 09/30/2019   Procedure: LEFT HEART CATH AND CORONARY ANGIOGRAPHY;  Surgeon: Wenona Hamilton, MD;  Location: [facility] INVASIVE CV LAB;  Service: Cardiovascular;  Laterality: N/A;   LEFT HEART CATH AND CORS/GRAFTS ANGIOGRAPHY N/A 10/01/2021   Procedure: LEFT HEART CATH AND CORS/GRAFTS ANGIOGRAPHY;  Surgeon: Sammy Crisp, MD;  Location: [facility] INVASIVE CV LAB;  Service: Cardiovascular;  Laterality: N/A;     FAMILY HISTORY   Family History  Problem Relation Age of Onset   Heart disease Mother    Heart disease Father      SOCIAL HISTORY   Social History   Tobacco Use   Smoking status: Former    Current packs/day: 0.00    Average packs/day: 2.0 packs/day for 40.0 years (80.0 ttl pk-yrs)    Types: Cigarettes    Start date: 52    Quit date: 2013    Years since quitting: 12.3   Smokeless tobacco: Never  Vaping Use   Vaping status: Never Used  Substance Use Topics   Alcohol  use: Not Currently    Comment: occassionally - 1 beer/month or less   Drug use: Not Currently     MEDICATIONS    Home Medication:    Current Medication:  Current Facility-Administered Medications:    acetaminophen  (TYLENOL ) tablet 650 mg, 650 mg, Oral, Q6H PRN, Sheril Dines, MD, 650 mg at 03/03/24 8295   albuterol  (PROVENTIL ) (2.5 MG/3ML) 0.083% nebulizer solution 2.5 mg, 2.5 mg, Nebulization, BID, Ayiku, Bernard, MD, 2.5 mg at 03/03/24 6213   atorvastatin  (LIPITOR ) tablet 40 mg, 40 mg, Oral, Daily, Sheril Dines, MD, 40 mg at 03/03/24 0865    budeson-glycopyrrolate -formoterol  (BREZTRI ) 160-9-4.8 MCG/ACT inhaler 2 puff, 2 puff, Inhalation, BID, Sheril Dines, MD, 2 puff at 03/03/24 0824   carvedilol  (COREG ) tablet 6.25 mg, 6.25 mg, Oral, BID WC, Sheril Dines, MD, 6.25 mg at 03/03/24 7846   enoxaparin  (LOVENOX ) injection 40 mg, 40 mg, Subcutaneous, Q24H, Corrinne Din, MD, 40 mg at 03/02/24 2035   HYDROmorphone  (DILAUDID ) injection 1 mg, 1 mg, Intravenous, Q2H PRN, Corrinne Din, MD, 1 mg at 02/28/24 0911   ondansetron  (ZOFRAN ) tablet 4 mg, 4 mg, Oral, Q6H PRN **OR** ondansetron  (ZOFRAN ) injection 4 mg, 4 mg, Intravenous, Q6H PRN, Corrinne Din, MD, 4 mg at 02/24/24 2257   pantoprazole  (PROTONIX ) EC tablet 40 mg, 40 mg, Oral, Daily, Ayiku, Bernard, MD, 40 mg at 03/03/24 0824   Pirfenidone  TABS 801 mg, 801 mg, Oral, TID AC, Ayiku, Bernard, MD, 801 mg at 03/03/24 0824   predniSONE  (DELTASONE ) tablet  20 mg, 20 mg, Oral, Daily, Sheril Dines, MD, 20 mg at 03/03/24 4098   roflumilast  (DALIRESP ) tablet 500 mcg, 500 mcg, Oral, Daily, Sheril Dines, MD, 500 mcg at 03/03/24 1191   Treprostinil  (TYVASO ) inhalation solution 18 mcg, 18 mcg, Inhalation, QID, Sheril Dines, MD, 18 mcg at 03/02/24 0734    ALLERGIES   Ace inhibitors and Cinnamon     REVIEW OF SYSTEMS    Review of Systems:  Gen:  Denies  fever, sweats, chills weigh loss  HEENT: Denies blurred vision, double vision, ear pain, eye pain, hearing loss, nose bleeds, sore throat Cardiac:  No dizziness, chest pain or heaviness, chest tightness,edema Resp:   reports dyspnea chronically  Gi: Denies swallowing difficulty, stomach pain, nausea or vomiting, diarrhea, constipation, bowel incontinence Gu:  Denies bladder incontinence, burning urine Ext:   Denies Joint pain, stiffness or swelling Skin: Denies  skin rash, easy bruising or bleeding or hives Endoc:  Denies polyuria, polydipsia , polyphagia or weight change Psych:   Denies depression, insomnia or  hallucinations   Other:  All other systems negative   VS: BP 122/81 (BP Location: Left Arm)   Pulse 73   Temp 98.1 F (36.7 C) (Oral)   Resp 19   SpO2 95%      PHYSICAL EXAM    GENERAL:NAD, no fevers, chills, no weakness no fatigue HEAD: Normocephalic, atraumatic.  EYES: Pupils equal, round, reactive to light. Extraocular muscles intact. No scleral icterus.  MOUTH: Moist mucosal membrane. Dentition intact. No abscess noted.  EAR, NOSE, THROAT: Clear without exudates. No external lesions.  NECK: Supple. No thyromegaly. No nodules. No JVD.  PULMONARY: decreased breath sounds with mild rhonchi worse at bases bilaterally.  CARDIOVASCULAR: S1 and S2. Regular rate and rhythm. No murmurs, rubs, or gallops. No edema. Pedal pulses 2+ bilaterally.  GASTROINTESTINAL: Soft, nontender, nondistended. No masses. Positive bowel sounds. No hepatosplenomegaly.  MUSCULOSKELETAL: No swelling, clubbing, or edema. Range of motion full in all extremities.  NEUROLOGIC: Cranial nerves II through XII are intact. No gross focal neurological deficits. Sensation intact. Reflexes intact.  SKIN: No ulceration, lesions, rashes, or cyanosis. Skin warm and dry. Turgor intact.  PSYCHIATRIC: Mood, affect within normal limits. The patient is awake, alert and oriented x 3. Insight, judgment intact.       IMAGING  Reviewed CXR and CT with patient and family at bedside  ASSESSMENT/PLAN   Secondary spontaneous pneumothorax                - chest tube on right - mild to moderate pain and tenderness             - cxr with residual moderate pneumothorax remaining    - continue chest tube to negative 20 cm H20    -repeat CXR - chest tube management - +air leak   2. Advanced COPD           Continue nebulizer therapy          Continue abx  3. Bibasilar atelectasis             IS at bedside   4. Physical deconditioning       PT/OT when able             Thank you for allowing me to participate in the  care of this patient.   Patient/Family are satisfied with care plan and all questions have been answered.    Provider disclosure: Patient with at least one acute or chronic illness  or injury that poses a threat to life or bodily function and is being managed actively during this encounter.  All of the below services have been performed independently by signing provider:  review of prior documentation from internal and or external health records.  Review of previous and current lab results.  Interview and comprehensive assessment during patient visit today. Review of current and previous chest radiographs/CT scans. Discussion of management and test interpretation with health care team and patient/family.   This document was prepared using Dragon voice recognition software and may include unintentional dictation errors.     Jalexus Brett, M.D.  Division of Pulmonary & Critical Care Medicine

## 2024-03-03 NOTE — Progress Notes (Signed)
  Progress Note   Patient: Dillon [name] ZOX:[id number] DOB: 1956/04/29 DOA: 02/24/2024     8 DOS: the patient was seen and examined on 03/03/2024   Brief hospital course: Dillon [name] is a 68 y.o. male with medical history significant for COPD, pulmonary fibrosis, chronic hypoxic respiratory failure on 5 L/min oxygen , restless leg syndrome, GERD, hypertension, CAD, who presented to the hospital with acute onset of right-sided chest pain and shortness of breath.   He was found to have large right sided pneumothorax complicated by acute on chronic hypoxic respiratory failure.   He initially required BiPAP in the ED for acute respiratory failure. Chest tube was inserted in the right pleural space in the emergency room on 02/24/2024.   Principal Problem:   Pneumothorax Active Problems:   Acute on chronic hypoxic respiratory failure (HCC)   Coronary artery disease due to lipid rich plaque   Essential hypertension   Pulmonary fibrosis (HCC)   GERD without esophagitis   Chronic heart failure with preserved ejection fraction (HFpEF) (HCC)   Assessment and Plan: Acute on chronic hypoxic respiratory failure (HCC) Spontaneous pneumothorax Acute decompensated respiratory status initially requiring BiPAP  in the setting of spontaneous large pneumothorax on chest x-ray with chronic pulmonary fibrosis and COPD Status post chest tube and pigtail catheter in the ER Patient followed by pulmonology.  Still requiring chest tube.  Repeated chest x-ray yesterday had a slightly increased pneumothorax after clamping of chest tube. Patient still has air leak, planning for observing for a little longer before clamping the tube again.   Pulmonary fibrosis. COPD. Condition stable, still on home dose of prednisone .   Coronary artery disease due to lipid rich plaque Condition stable.   Essential hypertension BP stable Titrate home regimen   Chronic heart failure with preserved ejection fraction (HFpEF)  (HCC) 2D echo January 2025 with EF of 55 to 60% and grade 1 diastolic dysfunction Appears euvolemic at present Patient is euvolemic.   GERD without esophagitis PPI          Subjective:  Patient doing well today, no chest pain.  No shortness of breath.  Physical Exam: Vitals:   03/02/24 2145 03/03/24 0642 03/03/24 0719 03/03/24 0755  BP: 135/85 127/77  122/81  Pulse: 64 68  73  Resp: 18 19    Temp: 98.1 F (36.7 C) 98.9 F (37.2 C)  98.1 F (36.7 C)  TempSrc:  Oral  Oral  SpO2: 94% 98% 97% 95%   General exam: Appears calm and comfortable  Respiratory system: Decreased breathing sounds respiratory effort normal. Cardiovascular system: S1 & S2 heard, RRR. No JVD, murmurs, rubs, gallops or clicks. No pedal edema. Gastrointestinal system: Abdomen is nondistended, soft and nontender. No organomegaly or masses felt. Normal bowel sounds heard. Central nervous system: Alert and oriented. No focal neurological deficits. Extremities: Symmetric 5 x 5 power. Skin: No rashes, lesions or ulcers Psychiatry: Judgement and insight appear normal. Mood & affect appropriate.    Data Reviewed:  Lab results reviewed.  Family Communication: Wife updated at bedside.  Disposition: Status is: Inpatient Remains inpatient appropriate because: Severity of disease, inpatient procedure.     Time spent: 35 minutes  Author: Donaciano Frizzle, MD 03/03/2024 11:06 AM  For on call review [email or web address].

## 2024-03-03 NOTE — Progress Notes (Signed)
 Physical Therapy Treatment Patient Details Name: Dillon [name] MRN: [id number] DOB: 1956/06/11 Today's Date: 03/03/2024   History of Present Illness 68 y.o. male with medical history significant of chronic respiratory failure on 4-5 L, COPD, GERD, pulmonary fibrosis, restless leg syndrome and coronary artery disease presenting with acute on chronic respiratory failure with hypoxia and pneumothorax s/p chest tube placement 5/7    PT Comments  Pt seen for PT tx with pt agreeable, wife present for session. Session focused on sit<>stands (x5 reps) without BUE support with focus on BLE strengthening & endurance training. At times, pt requires CGA 2/2 decreased balance, pt with posterior lean onto EOB as well. Pt requires seated rest breaks between transfers to recover SpO2. Will attempt to progress gait when pt no longer attached to chest tube wall suction.    If plan is discharge home, recommend the following: A little help with walking and/or transfers;A little help with bathing/dressing/bathroom;Assistance with cooking/housework;Assist for transportation;Help with stairs or ramp for entrance   Can travel by private vehicle     Yes  Equipment Recommendations  Rollator (4 wheels)    Recommendations for Other Services       Precautions / Restrictions Precautions Precautions: Fall Precaution/Restrictions Comments: Right chest tube to suction Restrictions Weight Bearing Restrictions Per Provider Order: No     Mobility  Bed Mobility Overal bed mobility: Modified Independent Bed Mobility: Supine to Sit     Supine to sit: Modified independent (Device/Increase time), HOB elevated          Transfers   Equipment used: None Transfers: Sit to/from Stand Sit to Stand: Contact guard assist           General transfer comment: sit<>stand from EOB without BUE support; posterior lean onto bed with BLE, CGA for balance at times; pt performed 5x sit<>Stand with seated rest in between to  recover SPO2; activity focused on BLE strengthening & endurance training    Ambulation/Gait                   Stairs             Wheelchair Mobility     Tilt Bed    Modified Rankin (Stroke Patients Only)       Balance Overall balance assessment: Needs assistance Sitting-balance support: Feet supported Sitting balance-Leahy Scale: Normal     Standing balance support: No upper extremity supported, During functional activity Standing balance-Leahy Scale: Fair                              Hotel manager: No apparent difficulties  Cognition Arousal: Alert Behavior During Therapy: WFL for tasks assessed/performed   PT - Cognitive impairments: No apparent impairments                         Following commands: Intact      Cueing Cueing Techniques: Verbal cues  Exercises      General Comments General comments (skin integrity, edema, etc.): Pt on 6L/min via nasal cannula, lowest SpO2 of 83% with pt recovering to >/= 90% with seated rest. Chest tube without issues during session.      Pertinent Vitals/Pain Pain Assessment Pain Assessment: No/denies pain    Home Living                          Prior Function  PT Goals (current goals can now be found in the care plan section) Acute Rehab PT Goals Patient Stated Goal: go home PT Goal Formulation: With patient/family Time For Goal Achievement: 03/08/24 Potential to Achieve Goals: Good Progress towards PT goals: Progressing toward goals    Frequency    Min 2X/week      PT Plan      Co-evaluation              AM-PAC PT "6 Clicks" Mobility   Outcome Measure  Help needed turning from your back to your side while in a flat bed without using bedrails?: None Help needed moving from lying on your back to sitting on the side of a flat bed without using bedrails?: None Help needed moving to and from a bed to a chair  (including a wheelchair)?: A Little Help needed standing up from a chair using your arms (e.g., wheelchair or bedside chair)?: None Help needed to walk in hospital room?: A Little Help needed climbing 3-5 steps with a railing? : A Little 6 Click Score: 21    End of Session Equipment Utilized During Treatment: Oxygen  Activity Tolerance: Patient tolerated treatment well Patient left: with call bell/phone within reach;with family/visitor present (sitting EOB with lunch tray) Nurse Communication: Mobility status PT Visit Diagnosis: Muscle weakness (generalized) (M62.81);Unsteadiness on feet (R26.81)     Time: 9562-1308 PT Time Calculation (min) (ACUTE ONLY): 18 min  Charges:    $Therapeutic Activity: 8-22 mins PT General Charges $$ ACUTE PT VISIT: 1 Visit                     Emaline Handsome, PT, DPT 03/03/24, 12:28 PM   Venetta Gill 03/03/2024, 12:27 PM

## 2024-03-03 NOTE — Progress Notes (Signed)
 Occupational Therapy Treatment Patient Details Name: Dillon [name] MRN: [id number] DOB: 08/23/1956 Today's Date: 03/03/2024   History of present illness 68 y.o. male with medical history significant of chronic respiratory failure on 4-5 L, COPD, GERD, pulmonary fibrosis, restless leg syndrome and coronary artery disease presenting with acute on chronic respiratory failure with hypoxia and pneumothorax s/p chest tube placement 5/7   OT comments  Pt seen for OT tx. Pt received seated EOB after using BSC with spouse assist. Pt on 6L, SpO2 in low to mid 90's. Pt tolerated standing marches with handheld assist for ~45sec before drop in SpO2 to 85% requiring seated rest break and PLB over to recover >90%. Further instruction in ECS and incorporating use of pulse oximeter into ADL/mobility to support activity pacing and rest breaks to minimize risk of over exertion. Pt/spouse verbalized understanding. Pt continues to demonstrate poor activity tolerance requiring increased assist for ADL and mobility. Will continue to progress towards goals. Educated in recommendations at discharge as well as consideration of pulmonary rehab to support pt's goal of returning to PLOF. Pt/spouse reported they will think about it.       If plan is discharge home, recommend the following:  A little help with walking and/or transfers;A little help with bathing/dressing/bathroom;Assistance with cooking/housework;Assist for transportation;Help with stairs or ramp for entrance   Equipment Recommendations  None recommended by OT       Precautions / Restrictions Precautions Precautions: Fall Recall of Precautions/Restrictions: Intact Precaution/Restrictions Comments: Right chest tube to suction Restrictions Weight Bearing Restrictions Per Provider Order: No     Mobility Bed Mobility Overal bed mobility: Modified Independent        Transfers Overall transfer level: Modified independent Equipment used:  None Transfers: Sit to/from Stand       Balance Overall balance assessment: Needs assistance Sitting-balance support: Feet supported Sitting balance-Leahy Scale: Normal     Standing balance support: Bilateral upper extremity supported Standing balance-Leahy Scale: Fair Standing balance comment: handheld assist in standing for marches            Communication Communication Communication: No apparent difficulties   Cognition Arousal: Alert Behavior During Therapy: WFL for tasks assessed/performed Cognition: No apparent impairments     Following commands: Intact        Cueing   Cueing Techniques: Verbal cues  Exercises Other Exercises Other Exercises: Further instruction in ECS and incorporating use of pulse oximeter into ADL/mobility to support activity pacing and rest breaks to minimize risk of over exertion.       General Comments On 6L, drops to 85% with standing marching ~45 sec, requiring PLB and to recover to >90%.    Pertinent Vitals/ Pain       Pain Assessment Pain Assessment: No/denies pain   Progress Toward Goals  OT Goals(current goals can now be found in the care plan section)  Progress towards OT goals: Progressing toward goals  Acute Rehab OT Goals Patient Stated Goal: go home OT Goal Formulation: With patient/family Time For Goal Achievement: 03/09/24 Potential to Achieve Goals: Good         AM-PAC OT "6 Clicks" Daily Activity     Outcome Measure   Help from another person eating meals?: None Help from another person taking care of personal grooming?: None Help from another person toileting, which includes using toliet, bedpan, or urinal?: A Little Help from another person bathing (including washing, rinsing, drying)?: A Little Help from another person to put on and taking off  regular upper body clothing?: A Little Help from another person to put on and taking off regular lower body clothing?: A Little 6 Click Score: 20    End  of Session Equipment Utilized During Treatment: Oxygen   OT Visit Diagnosis: Unsteadiness on feet (R26.81);Repeated falls (R29.6);Muscle weakness (generalized) (M62.81)   Activity Tolerance Patient tolerated treatment well   Patient Left in chair;with call bell/phone within reach;with family/visitor present;Other (comment) (chest tube in place)   Nurse Communication          Time: 1610-9604 OT Time Calculation (min): 13 min  Charges: OT General Charges $OT Visit: 1 Visit OT Treatments $Therapeutic Activity: 8-22 mins  Berenda Breaker., MPH, MS, OTR/L ascom [phone] 03/03/24, 10:44 AM

## 2024-03-04 ENCOUNTER — Inpatient Hospital Stay

## 2024-03-04 DIAGNOSIS — I5032 Chronic diastolic (congestive) heart failure: Secondary | ICD-10-CM | POA: Diagnosis not present

## 2024-03-04 DIAGNOSIS — J9621 Acute and chronic respiratory failure with hypoxia: Secondary | ICD-10-CM | POA: Diagnosis not present

## 2024-03-04 DIAGNOSIS — J9383 Other pneumothorax: Secondary | ICD-10-CM | POA: Diagnosis not present

## 2024-03-04 NOTE — Plan of Care (Signed)

## 2024-03-04 NOTE — Progress Notes (Signed)
 Mobility Specialist - Progress Note     03/04/24 1515  Mobility  Activity Stood at bedside;Transferred from bed to chair  Level of Assistance Standby assist, set-up cues, supervision of patient - no hands on  Assistive Device None  Range of Motion/Exercises Active  Activity Response Tolerated well  Mobility Referral Yes  Mobility visit 1 Mobility  Mobility Specialist Start Time (ACUTE ONLY) 1508  Mobility Specialist Stop Time (ACUTE ONLY) 1522  Mobility Specialist Time Calculation (min) (ACUTE ONLY) 14 min   Pt resting in bed on RA upon entry on 6L. Pt STS and transfers to recliner SBA with no AD. Pt left in recliner with needs in reach. MS monitored lines and leads during transition.    Jerri Morale Mobility Specialist 03/04/24, 3:31 PM

## 2024-03-04 NOTE — Progress Notes (Signed)
 PULMONOLOGY         Date: 03/04/2024,   MRN# [id number] Dillon [name] [facility] 09/17/1956     Admission                  Current   Referring provider: Dr Daisey Dryer   CHIEF COMPLAINT:   Pneumothorax   HISTORY OF PRESENT ILLNESS   Dillon [name] is a 68 y.o. male with medical history significant of chronic respiratory failure on 4-5 L, COPD, GERD, pulmonary fibrosis, restless leg syndrome and coronary artery disease presenting with acute on chronic respiratory failure with hypoxia and pneumothorax.  Patient reports acute onset of severe right-sided chest pain and shortness of breath earlier this morning.  He reports pain started when he was getting prepared for work on Tuesday and as he was taking shirt off to get into shower he felt severe pain suddenly. No focal hemiparesis or confusion.  No abdominal pain.  Positive mild right-sided chest pain with deep breathing and movement.  Has been compliant otherwise with home respiratory regimen.   Presented to the ER afebrile, heart rate into the 100s, initially requiring BiPAP.  Noted moderate to large right-sided pneumothorax on chest x-ray.  Chest tube with pigtail catheter placed by ER physician. Patient is now improved but still has some residual pain on right hemithorax. PCCM consultation for further management of secondary spontaneous pneumothorax.   02/26/24- patient with resolution of pneumothorax on cxr.  Will clamp tube overnight and repeat cxr in am for preparation to remove chest tube.     02/27/24- patient with no overnight events.  I have clamped chest tube and we will repeat cxr in am in preparation for chest tube removal.    02/28/24- patient with recurrence of pneumothorax overnight after clamping the chest tube overnight. We have repeated cxr this am and then re-opened chest tube to suction.  Ive discussed with patient plan to keep chest tube open to suction.  There is no air leak noted on chest tube management.   02/29/24-  patient improved and is sitting in chair OOB.  Daughter and wife at bedside. He still has + air leak on chest tube evaluation and requires suction to atrium.   03/01/24- patient seen at bedside.  Stable on 7L/min [place].  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 If fistula persists he may need thoracic surgery evaluation.   03/03/24- patient still has air leak this morning.  He feels anxious about closing off tube and I think its reasonable to give him more time to heal bronchopleural fistula.  He may need thoracic surgery evaluation.   03/04/24- patient was seen at bedside, he still has positive air leak and chest discomfort.  He may need few more days to allow bronchopleural fistula to heal.  Alternatively he may need evaluation by thoracic surgery.   PAST MEDICAL HISTORY   Past Medical History:  Diagnosis Date   Chronic cough    COPD (chronic obstructive pulmonary disease) (HCC)    Coronary artery disease    Edentulous    Elevated cholesterol    GERD (gastroesophageal reflux disease)    Myocardial infarction (HCC)    Pulmonary fibrosis (HCC)    Restless leg syndrome    Supplemental oxygen  dependent      SURGICAL HISTORY   Past Surgical History:  Procedure Laterality Date   ANGIOPLASTY  2010   2  stents placed   BACK SURGERY     cardiac stents     x2   CATARACT EXTRACTION W/PHACO Left 08/20/2023   Procedure: CATARACT EXTRACTION PHACO AND INTRAOCULAR LENS PLACEMENT (IOC) LEFT 16.87 01:21.8;  Surgeon: Trudi Fus, MD;  Location: [facility];  Service: Ophthalmology;  Laterality: Left;   CATARACT EXTRACTION W/PHACO Right 09/03/2023   Procedure: CATARACT EXTRACTION PHACO AND INTRAOCULAR LENS PLACEMENT (IOC) RIGHT 19.41 01:30.0;  Surgeon: Trudi Fus, MD;  Location: [facility];  Service: Ophthalmology;  Laterality: Right;   COLONOSCOPY     COLONOSCOPY  WITH PROPOFOL  N/A 03/05/2018   Procedure: COLONOSCOPY WITH PROPOFOL ;  Surgeon: Irby Mannan, MD;  Location: [facility] ENDOSCOPY;  Service: Endoscopy;  Laterality: N/A;   COLONOSCOPY WITH PROPOFOL  N/A 01/24/2022   Procedure: COLONOSCOPY WITH PROPOFOL ;  Surgeon: Luke Salaam, MD;  Location: [facility] ENDOSCOPY;  Service: Gastroenterology;  Laterality: N/A;   CORONARY ANGIOPLASTY     CORONARY/GRAFT ACUTE MI REVASCULARIZATION N/A 09/30/2019   Procedure: Coronary/Graft Acute MI Revascularization;  Surgeon: Wenona Hamilton, MD;  Location: [facility] INVASIVE CV LAB;  Service: Cardiovascular;  Laterality: N/A;   LEFT HEART CATH AND CORONARY ANGIOGRAPHY N/A 09/30/2019   Procedure: LEFT HEART CATH AND CORONARY ANGIOGRAPHY;  Surgeon: Wenona Hamilton, MD;  Location: [facility] INVAS[facility] CV LAB;  Service: Cardiovascular;  Laterality: N/A;   LEFT HEART CATH AND CORS/GRAFTS ANGIOGRAPHY N/A 10/01/2021   Procedure: LEFT HEART CATH AND CORS/GRAFTS ANGIOGRAPHY;  Surgeon: Sammy Crisp, MD;  Location: [facility] INVASIVE CV LAB;  Service: Cardiovascular;  Laterality: N/A;     FAMILY HISTORY   Family History  Problem Relation Age of Onset   Heart disease Mother    Heart disease Father      SOCIAL HISTORY   Social History   Tobacco Use   Smoking status: Former    Current packs/day: 0.00    Average packs/day: 2.0 packs/day for 40.0 years (80.0 ttl pk-yrs)    Types: Cigarettes    Start date: 22    Quit date: 2013    Years since quitting: 12.3   Smokeless tobacco: Never  Vaping Use   Vaping status: Never Used  Substance Use Topics   Alcohol  use: Not Currently    Comment: occassionally - 1 beer/month or less   Drug use: Not Currently     MEDICATIONS    Home Medication:    Current Medication:  Current Facility-Administered Medications:    acetaminophen  (TYLENOL ) tablet 650 mg, 650 mg, Oral, Q6H PRN, Sheril Dines, MD, 650 mg at 03/04/24 1191   albuterol  (PROVENTIL ) (2.5 MG/3ML) 0.083% nebulizer  solution 2.5 mg, 2.5 mg, Nebulization, BID, Ayiku, Bernard, MD, 2.5 mg at 03/04/24 4782   atorvastatin  (LIPITOR ) tablet 40 mg, 40 mg, Oral, Daily, Sheril Dines, MD, 40 mg at 03/04/24 9562   budeson-glycopyrrolate -formoterol  (BREZTRI ) 160-9-4.8 MCG/ACT inhaler 2 puff, 2 puff, Inhalation, BID, Sheril Dines, MD, 2 puff at 03/04/24 0813   carvedilol  (COREG ) tablet 6.25 mg, 6.25 mg, Oral, BID WC, Ayiku, Bernard, MD, 6.25 mg at 03/04/24 1308   enoxaparin  (LOVENOX ) injection 40 mg, 40 mg, Subcutaneous, Q24H, Corrinne Din, MD, 40 mg at 03/03/24 2141   HYDROmorphone  (DILAUDID ) injection 1 mg, 1 mg, Intravenous, Q2H PRN, Corrinne Din, MD, 1 mg at 02/28/24 0911   ondansetron  (ZOFRAN ) tablet 4 mg, 4 mg, Oral, Q6H PRN **OR** ondansetron  (ZOFRAN ) injection 4 mg, 4 mg, Intravenous, Q6H PRN, Corrinne Din, MD, 4 mg at 02/24/24 2257   pantoprazole  (PROTONIX ) EC  tablet 40 mg, 40 mg, Oral, Daily, Sheril Dines, MD, 40 mg at 03/04/24 7829   Pirfenidone  TABS 801 mg, 801 mg, Oral, TID AC, Ayiku, Bernard, MD, 801 mg at 03/04/24 5621   predniSONE  (DELTASONE ) tablet 20 mg, 20 mg, Oral, Daily, Sheril Dines, MD, 20 mg at 03/04/24 0812   roflumilast  (DALIRESP ) tablet 500 mcg, 500 mcg, Oral, Daily, Sheril Dines, MD, 500 mcg at 03/04/24 3086   Treprostinil  (TYVASO ) inhalation solution 18 mcg, 18 mcg, Inhalation, QID, Sheril Dines, MD, 18 mcg at 03/02/24 0734    ALLERGIES   Ace inhibitors and Cinnamon     REVIEW OF SYSTEMS    Review of Systems:  Gen:  Denies  fever, sweats, chills weigh loss  HEENT: Denies blurred vision, double vision, ear pain, eye pain, hearing loss, nose bleeds, sore throat Cardiac:  No dizziness, chest pain or heaviness, chest tightness,edema Resp:   reports dyspnea chronically  Gi: Denies swallowing difficulty, stomach pain, nausea or vomiting, diarrhea, constipation, bowel incontinence Gu:  Denies bladder incontinence, burning urine Ext:   Denies Joint pain, stiffness  or swelling Skin: Denies  skin rash, easy bruising or bleeding or hives Endoc:  Denies polyuria, polydipsia , polyphagia or weight change Psych:   Denies depression, insomnia or hallucinations   Other:  All other systems negative   VS: BP 112/77 (BP Location: Right Arm)   Pulse 74   Temp 98.3 F (36.8 C)   Resp 20   SpO2 95%      PHYSICAL EXAM    GENERAL:NAD, no fevers, chills, no weakness no fatigue HEAD: Normocephalic, atraumatic.  EYES: Pupils equal, round, reactive to light. Extraocular muscles intact. No scleral icterus.  MOUTH: Moist mucosal membrane. Dentition intact. No abscess noted.  EAR, NOSE, THROAT: Clear without exudates. No external lesions.  NECK: Supple. No thyromegaly. No nodules. No JVD.  PULMONARY: decreased breath sounds with mild rhonchi worse at bases bilaterally.  CARDIOVASCULAR: S1 and S2. Regular rate and rhythm. No murmurs, rubs, or gallops. No edema. Pedal pulses 2+ bilaterally.  GASTROINTESTINAL: Soft, nontender, nondistended. No masses. Positive bowel sounds. No hepatosplenomegaly.  MUSCULOSKELETAL: No swelling, clubbing, or edema. Range of motion full in all extremities.  NEUROLOGIC: Cranial nerves II through XII are intact. No gross focal neurological deficits. Sensation intact. Reflexes intact.  SKIN: No ulceration, lesions, rashes, or cyanosis. Skin warm and dry. Turgor intact.  PSYCHIATRIC: Mood, affect within normal limits. The patient is awake, alert and oriented x 3. Insight, judgment intact.       IMAGING  Reviewed CXR and CT with patient and family at bedside  ASSESSMENT/PLAN   Secondary spontaneous pneumothorax                - chest tube on right - mild to moderate pain and tenderness             - cxr with residual moderate pneumothorax remaining    - continue chest tube to negative 20 cm H20    -repeat CXR - chest tube management - +air leak   2. Advanced COPD           Continue nebulizer therapy          Continue abx  3.  Bibasilar atelectasis             IS at bedside   4. Physical deconditioning       PT/OT when able             Thank you for  allowing me to participate in the care of this patient.   Patient/Family are satisfied with care plan and all questions have been answered.    Provider disclosure: Patient with at least one acute or chronic illness or injury that poses a threat to life or bodily function and is being managed actively during this encounter.  All of the below services have been performed independently by signing provider:  review of prior documentation from internal and or external health records.  Review of previous and current lab results.  Interview and comprehensive assessment during patient visit today. Review of current and previous chest radiographs/CT scans. Discussion of management and test interpretation with health care team and patient/family.   This document was prepared using Dragon voice recognition software and may include unintentional dictation errors.     Schawn Byas, M.D.  Division of Pulmonary & Critical Care Medicine

## 2024-03-04 NOTE — Progress Notes (Signed)
 Occupational Therapy Treatment Patient Details Name: Dillon [name] MRN: [id number] DOB: 15-Jul-1956 Today's Date: 03/04/2024   History of present illness 68 y.o. male with medical history significant of chronic respiratory failure on 4-5 L, COPD, GERD, pulmonary fibrosis, restless leg syndrome and coronary artery disease presenting with acute on chronic respiratory failure with hypoxia and pneumothorax s/p chest tube placement 5/7   OT comments  Pt seen for OT tx this date. Pt received in recliner, agreeable and denies SOB. Pt noted to be on 7L O2. Pt completed 4 bouts of marching in place with light UE support on RW for stability requiring standing rest breaks between each for recovery. Excellent use of PLB to support recovery. 1st bout he tolerated marching before SpO2 dropped to 89%. 2nd and 3rd bouts pt tolerated only 30 sec marching before requiring standing rest break to support SpO2 recovery. After a longer standing rest break pt was able to improve to 95% in standing and then tolerated marching in place before he endorsed >5/10 RPE, SpO2 down to 86% briefly and returned to sitting. Improved to 95% once seated within . Pt demonstrating improved tolerance and continues to benefit.       If plan is discharge home, recommend the following:  A little help with walking and/or transfers;A little help with bathing/dressing/bathroom;Assistance with cooking/housework;Assist for transportation;Help with stairs or ramp for entrance   Equipment Recommendations  None recommended by OT       Precautions / Restrictions Precautions Precautions: Fall Recall of Precautions/Restrictions: Intact Precaution/Restrictions Comments: Right chest tube to suction Restrictions Weight Bearing Restrictions Per Provider Order: No       Mobility Bed Mobility     General bed mobility comments: NT, in recliner    Transfers Overall transfer level: Modified independent Equipment used:  None Transfers: Sit to/from Stand     Balance Overall balance assessment: Needs assistance Sitting-balance support: Feet supported Sitting balance-Leahy Scale: Normal     Standing balance support: No upper extremity supported, During functional activity Standing balance-Leahy Scale: Fair       ADL either performed or assessed with clinical judgement   Communication Communication Communication: No apparent difficulties   Cognition Arousal: Alert Behavior During Therapy: WFL for tasks assessed/performed Cognition: No apparent impairments      Following commands: Intact        Cueing   Cueing Techniques: Verbal cues  Exercises Other Exercises Other Exercises: Pt completed 4 bouts of marching in place with light UE support on RW for stability requiring standing rest breaks between each for PLB recovery. 1st bout tolerated before SpO2 dropped to 89%, 2nd and 3rd bouts tolerated only 30 sec before requiring break to support SpO2 recovery, and after longer standing rest break pt able to improve to 95% in standing and tolerated marching in place before he endorsed >5/10 RPE and returned to sitting.     Pertinent Vitals/ Pain       Pain Assessment Pain Assessment: 0-10 Pain Score: 4  Pain Location: R chest, increases with ambulation/deeper breathing Pain Descriptors / Indicators: Discomfort, Guarding, Grimacing, Sore Pain Intervention(s): Limited activity within patient's tolerance, Monitored during session, Repositioned, Patient requesting pain meds-RN notified   Frequency  Min 3X/week     Progress Toward Goals  OT Goals(current goals can now be found in the care plan section)  Progress towards OT goals: Progressing toward goals  Acute Rehab OT Goals Patient Stated Goal: go home OT Goal Formulation: With patient/family Time For  Goal Achievement: 03/09/24 Potential to Achieve Goals: Good   AM-PAC OT "6 Clicks" Daily Activity     Outcome Measure   Help  from another person eating meals?: None Help from another person taking care of personal grooming?: None Help from another person toileting, which includes using toliet, bedpan, or urinal?: A Little Help from another person bathing (including washing, rinsing, drying)?: A Little Help from another person to put on and taking off regular upper body clothing?: A Little Help from another person to put on and taking off regular lower body clothing?: A Little 6 Click Score: 20    End of Session Equipment Utilized During Treatment: Oxygen ;Rolling walker (2 wheels)  OT Visit Diagnosis: Unsteadiness on feet (R26.81);Repeated falls (R29.6);Muscle weakness (generalized) (M62.81)   Activity Tolerance Patient tolerated treatment well   Patient Left in chair;with call bell/phone within reach;with family/visitor present;Other (comment) (chest tube in place)   Nurse Communication Patient requests pain meds        Time: 6578-4696 OT Time Calculation (min): 30 min  Charges: OT General Charges $OT Visit: 1 Visit OT Treatments $Therapeutic Activity: 23-37 mins  Berenda Breaker., MPH, MS, OTR/L ascom [phone] 03/04/24, 4:56 PM

## 2024-03-04 NOTE — Progress Notes (Signed)
  Progress Note   Patient: Dillon [name] NWG:[id number] DOB: Apr 22, 1956 DOA: 02/24/2024     9 DOS: the patient was seen and examined on 03/04/2024   Brief hospital course: Demaury Barahona is a 68 y.o. male with medical history significant for COPD, pulmonary fibrosis, chronic hypoxic respiratory failure on 5 L/min oxygen , restless leg syndrome, GERD, hypertension, CAD, who presented to the hospital with acute onset of right-sided chest pain and shortness of breath.   He was found to have large right sided pneumothorax complicated by acute on chronic hypoxic respiratory failure.   He initially required BiPAP in the ED for acute respiratory failure. Chest tube was inserted in the right pleural space in the emergency room on 02/24/2024.   Principal Problem:   Spontaneous pneumothorax Active Problems:   Acute on chronic hypoxic respiratory failure (HCC)   Coronary artery disease due to lipid rich plaque   Essential hypertension   Pulmonary fibrosis (HCC)   GERD without esophagitis   Chronic heart failure with preserved ejection fraction (HFpEF) (HCC)   Assessment and Plan: Acute on chronic hypoxic respiratory failure (HCC) Spontaneous pneumothorax Acute decompensated respiratory status initially requiring BiPAP  in the setting of spontaneous large pneumothorax on chest x-ray with chronic pulmonary fibrosis and COPD Status post chest tube and pigtail catheter in the ER Patient followed by pulmonology.   Repeated chest x-ray no longer has any pneumothorax, however, patient still has her air leak.  Decision made to keep patient little longer before clamping the chest tube again.  May consider CT surgery if pneumothorax does not resolve.   Pulmonary fibrosis. COPD. Condition stable, still on home dose of prednisone .   Coronary artery disease due to lipid rich plaque Condition stable.   Essential hypertension BP stable Titrate home regimen   Chronic heart failure with preserved ejection  fraction (HFpEF) (HCC) 2D echo January 2025 with EF of 55 to 60% and grade 1 diastolic dysfunction Appears euvolemic at present Patient is euvolemic.   GERD without esophagitis PPI      Subjective:  Patient doing well today, no complaint.  Physical Exam: Vitals:   03/03/24 2035 03/03/24 2050 03/04/24 0359 03/04/24 0801  BP:  122/71 117/80 112/77  Pulse:  74 67 74  Resp:  18 18 20   Temp:  98.2 F (36.8 C) 97.7 F (36.5 C) 98.3 F (36.8 C)  TempSrc:   Oral   SpO2: 93% 94% 91% 95%   General exam: Appears calm and comfortable  Respiratory system: Breathing sounds bilaterally. Respiratory effort normal. Cardiovascular system: S1 & S2 heard, RRR. No JVD, murmurs, rubs, gallops or clicks. No pedal edema. Gastrointestinal system: Abdomen is nondistended, soft and nontender. No organomegaly or masses felt. Normal bowel sounds heard. Central nervous system: Alert and oriented. No focal neurological deficits. Extremities: Symmetric 5 x 5 power. Skin: No rashes, lesions or ulcers Psychiatry: Judgement and insight appear normal. Mood & affect appropriate.    Data Reviewed:  Chest x-ray reviewed.  Family Communication: Wife updated at bedside.  Disposition: Status is: Inpatient Remains inpatient appropriate because: Severity of disease, inpatient procedure.     Time spent: 35 minutes  Author: Donaciano Frizzle, MD 03/04/2024 1:21 PM  For on call review [email or web address].

## 2024-03-04 NOTE — Plan of Care (Signed)

## 2024-03-05 DIAGNOSIS — J9621 Acute and chronic respiratory failure with hypoxia: Secondary | ICD-10-CM | POA: Diagnosis not present

## 2024-03-05 DIAGNOSIS — J9383 Other pneumothorax: Secondary | ICD-10-CM | POA: Diagnosis not present

## 2024-03-05 DIAGNOSIS — J8410 Pulmonary fibrosis, unspecified: Secondary | ICD-10-CM | POA: Diagnosis not present

## 2024-03-05 NOTE — Progress Notes (Signed)
  Progress Note   Patient: Dillon [name] [id number]:[id number] DOB: [birth year] DOA: 02/24/2024     10 DOS: the patient was seen and examined on 03/05/2024   Brief hospital course: Dillon [name] is a 69 y.o. male with medical history significant for COPD, pulmonary fibrosis, chronic hypoxic respiratory failure on 5 L/min oxygen , restless leg syndrome, GERD, hypertension, CAD, who presented to the hospital with acute onset of right-sided chest pain and shortness of breath.   He was found to have large right sided pneumothorax complicated by acute on chronic hypoxic respiratory failure.   He initially required BiPAP in the ED for acute respiratory failure. Chest tube was inserted in the right pleural space in the emergency room on 02/24/2024.   Principal Problem:   Spontaneous pneumothorax Active Problems:   Acute on chronic hypoxic respiratory failure (HCC)   Coronary artery disease due to lipid rich plaque   Essential hypertension   Pulmonary fibrosis (HCC)   GERD without esophagitis   Chronic heart failure with preserved ejection fraction (HFpEF) (HCC)   Assessment and Plan: Acute on chronic hypoxic respiratory failure (HCC) Spontaneous pneumothorax Acute decompensated respiratory status initially requiring BiPAP  in the setting of spontaneous large pneumothorax on chest x-ray with chronic pulmonary fibrosis and COPD Status post chest tube and pigtail catheter in the ER Patient followed by pulmonology.   Repeated chest x-ray no longer has any pneumothorax, however, patient still has her air leak.  Decision made to keep patient little longer before clamping the chest tube again.  May consider CT surgery if pneumothorax does not resolve.  Continue chest tube suction over the weekend.  Will make a decision on Monday if patient need CT surgery.   Pulmonary fibrosis. COPD. Condition stable, still on home dose of prednisone .   Coronary artery disease due to lipid rich plaque Condition stable.    Essential hypertension BP stable Titrate home regimen   Chronic heart failure with preserved ejection fraction (HFpEF) (HCC) 2D echo January 2025 with EF of 55 to 60% and grade 1 diastolic dysfunction Appears euvolemic at present Patient is euvolemic.   GERD without esophagitis PPI      Subjective:  Patient doing well, no complaint.  No short of breath.  Physical Exam: Vitals:   03/04/24 2036 03/05/24 0343 03/05/24 0735 03/05/24 0901  BP: 114/78 139/81  124/72  Pulse: 74 62  83  Resp: 16 16  19   Temp: 98.6 F (37 C) 97.9 F (36.6 C)  98.1 F (36.7 C)  TempSrc:      SpO2: 96% 98% 92% 94%   General exam: Appears calm and comfortable  Respiratory system: Decreased breathing sounds. Respiratory effort normal. Cardiovascular system: S1 & S2 heard, RRR. No JVD, murmurs, rubs, gallops or clicks. No pedal edema. Gastrointestinal system: Abdomen is nondistended, soft and nontender. No organomegaly or masses felt. Normal bowel sounds heard. Central nervous system: Alert and oriented. No focal neurological deficits. Extremities: Symmetric 5 x 5 power. Skin: No rashes, lesions or ulcers Psychiatry: Judgement and insight appear normal. Mood & affect appropriate.    Data Reviewed:  There are no new results to review at this time.  Family Communication: Wife updated at bedside.  Disposition: Status is: Inpatient Remains inpatient appropriate because: Severity of disease, inpatient procedure.     Time spent: 35 minutes  Author: Donaciano Frizzle, MD 03/05/2024 12:10 PM  For on call review [email or web address].

## 2024-03-06 DIAGNOSIS — J9621 Acute and chronic respiratory failure with hypoxia: Secondary | ICD-10-CM | POA: Diagnosis not present

## 2024-03-06 DIAGNOSIS — J9383 Other pneumothorax: Secondary | ICD-10-CM | POA: Diagnosis not present

## 2024-03-06 NOTE — Plan of Care (Signed)

## 2024-03-06 NOTE — Plan of Care (Signed)
  Problem: Education: Goal: Knowledge of General Education information will improve Description: Including pain rating scale, medication(s)/side effects and non-pharmacologic comfort measures Outcome: Progressing   Problem: Health Behavior/Discharge Planning: Goal: Ability to manage health-related needs will improve Outcome: Progressing   Problem: Clinical Measurements: Goal: Ability to maintain clinical measurements within normal limits will improve Outcome: Progressing Goal: Will remain free from infection Outcome: Progressing Goal: Diagnostic test results will improve Outcome: Progressing Goal: Respiratory complications will improve Outcome: Progressing Goal: Cardiovascular complication will be avoided Outcome: Progressing   Problem: Activity: Goal: Risk for activity intolerance will decrease Outcome: Progressing   Problem: Coping: Goal: Level of anxiety will decrease Outcome: Progressing   Problem: Pain Managment: Goal: General experience of comfort will improve and/or be controlled Outcome: Progressing   Problem: Skin Integrity: Goal: Risk for impaired skin integrity will decrease Outcome: Progressing

## 2024-03-06 NOTE — Progress Notes (Signed)
 Progress Note   Patient: Dillon [name] URK:[id number] DOB: [birth year] DOA: 02/24/2024     11 DOS: the patient was seen and examined on 03/06/2024   Brief hospital course: Dillon [name] is a 68 y.o. male with medical history significant for COPD, pulmonary fibrosis, chronic hypoxic respiratory failure on 5 L/min oxygen , restless leg syndrome, GERD, hypertension, CAD, who presented to the hospital with acute onset of right-sided chest pain and shortness of breath.   He was found to have large right sided pneumothorax complicated by acute on chronic hypoxic respiratory failure.   He initially required BiPAP in the ED for acute respiratory failure. Chest tube was inserted in the right pleural space in the emergency room on 02/24/2024.   Principal Problem:   Spontaneous pneumothorax Active Problems:   Acute on chronic hypoxic respiratory failure (HCC)   Coronary artery disease due to lipid rich plaque   Essential hypertension   Pulmonary fibrosis (HCC)   GERD without esophagitis   Chronic heart failure with preserved ejection fraction (HFpEF) (HCC)   Assessment and Plan: Acute on chronic hypoxic respiratory failure (HCC) Spontaneous pneumothorax Acute decompensated respiratory status initially requiring BiPAP  in the setting of spontaneous large pneumothorax on chest x-ray with chronic pulmonary fibrosis and COPD Status post chest tube and pigtail catheter in the ER Patient followed by pulmonology.   Repeated chest x-ray no longer has any pneumothorax, however, patient still has her air leak.  Decision made to keep patient little longer before clamping the chest tube again.  May consider CT surgery if pneumothorax does not resolve.   Patient still has some air leak, worsening with cough.  Continue chest tube to suction.  Will be seen again by pulmonology tomorrow, need to make decision whether patient need to be transferred to [facility] for CT surgery.   Pulmonary  fibrosis. COPD. Condition stable, still on home dose of prednisone .   Coronary artery disease due to lipid rich plaque Condition stable.   Essential hypertension BP stable Titrate home regimen   Chronic heart failure with preserved ejection fraction (HFpEF) (HCC) 2D echo January 2025 with EF of 55 to 60% and grade 1 diastolic dysfunction Appears euvolemic at present Patient is euvolemic.   GERD without esophagitis PPI        Subjective:  Patient doing well today, no shortness of breath or chest pain.  Physical Exam: Vitals:   03/05/24 1947 03/06/24 0519 03/06/24 0736 03/06/24 0823  BP: 119/78 116/69  117/75  Pulse: 68 64  73  Resp: 19 17    Temp: 98.2 F (36.8 C) 98 F (36.7 C)  98.2 F (36.8 C)  TempSrc:  Oral  Oral  SpO2: 95% 95% 93% 97%   General exam: Appears calm and comfortable  Respiratory system: Decreased breathing sounds. Respiratory effort normal. Cardiovascular system: S1 & S2 heard, RRR. No JVD, murmurs, rubs, gallops or clicks. No pedal edema. Gastrointestinal system: Abdomen is nondistended, soft and nontender. No organomegaly or masses felt. Normal bowel sounds heard. Central nervous system: Alert and oriented. No focal neurological deficits. Extremities: Symmetric 5 x 5 power. Skin: No rashes, lesions or ulcers Psychiatry: Judgement and insight appear normal. Mood & affect appropriate.    Data Reviewed:  There are no new results to review at this time.  Family Communication: Wife at bedside updated.  Disposition: Status is: Inpatient Remains inpatient appropriate because: Severity of disease, inpatient procedure.     Time spent: 35 minutes  Author: Donaciano Frizzle, MD  03/06/2024 1:56 PM  For on call review [email or web address].

## 2024-03-06 NOTE — Progress Notes (Addendum)
 Mobility Specialist - Progress Note    03/06/24 1442  Mobility  Activity Stood at bedside;Dangled on edge of bed  Level of Assistance Standby assist, set-up cues, supervision of patient - no hands on  Assistive Device Front wheel walker  Range of Motion/Exercises Active  Activity Response Tolerated well  Mobility Referral Yes  Mobility visit 1 Mobility  Mobility Specialist Start Time (ACUTE ONLY) 1421  Mobility Specialist Stop Time (ACUTE ONLY) 1444  Mobility Specialist Time Calculation (min) (ACUTE ONLY) 23 min   Pt resting in bed on 6L upon entry. Pt STS with no support x5 and took 1-2 minute rest breaks to recover breathe in between stands. Spo2: 88-90. MS observed SOB but recovered in between sets. Pt returned to bed and left with needs in reach.   Jerri Morale Mobility Specialist 03/06/24, 3:04 PM

## 2024-03-07 ENCOUNTER — Inpatient Hospital Stay: Admit: 2024-03-07 | Admitting: Internal Medicine

## 2024-03-07 ENCOUNTER — Encounter (HOSPITAL_COMMUNITY): Payer: Self-pay

## 2024-03-07 DIAGNOSIS — J8410 Pulmonary fibrosis, unspecified: Secondary | ICD-10-CM | POA: Diagnosis not present

## 2024-03-07 DIAGNOSIS — J9383 Other pneumothorax: Secondary | ICD-10-CM | POA: Diagnosis not present

## 2024-03-07 DIAGNOSIS — J9621 Acute and chronic respiratory failure with hypoxia: Secondary | ICD-10-CM | POA: Diagnosis not present

## 2024-03-07 MED ORDER — ENOXAPARIN SODIUM 40 MG/0.4ML IJ SOSY: 40.0000 mg | PREFILLED_SYRINGE | INTRAMUSCULAR | Status: AC

## 2024-03-07 NOTE — Progress Notes (Addendum)
 Dr. Deloise Ferries called me back, recommended send patient to [facility]. Contact [facility] transfer center. Dr. Ammon Kanaris is the CT surgeon. Dr. Daisey Dryer is the admitting hospitalist. 18:30. Patient is accepted to [facility].

## 2024-03-07 NOTE — Progress Notes (Signed)
 Occupational Therapy Treatment Patient Details Name: Dillon [name] MRN: [id number] DOB: 1956-02-10 Today's Date: 03/07/2024   History of present illness 68 y.o. male with medical history significant of chronic respiratory failure on 4-5 L, COPD, GERD, pulmonary fibrosis, restless leg syndrome and coronary artery disease presenting with acute on chronic respiratory failure with hypoxia and pneumothorax s/p chest tube placement 5/7   OT comments  Pt seen for OT tx, spouse present and supportive throughout. Pt/spouse expressed frustrations with their situation. OT offered active listening and emotional support and pt/spouse verbalized appreciation. Pt/spouse educated in recovery process, continued goals of therapy, OT facilitated problem solving for continued progress. Pt/spouse appreciative. Pt continues to demo improvement and benefit from ongoing skilled OT services.       If plan is discharge home, recommend the following:  A little help with walking and/or transfers;A little help with bathing/dressing/bathroom;Assistance with cooking/housework;Assist for transportation;Help with stairs or ramp for entrance   Equipment Recommendations  None recommended by OT    Recommendations for Other Services      Precautions / Restrictions Precautions Precautions: Fall Recall of Precautions/Restrictions: Intact Precaution/Restrictions Comments: Right chest tube to suction Restrictions Weight Bearing Restrictions Per Provider Order: No       Mobility Bed Mobility                    Transfers                         Balance                                           ADL either performed or assessed with clinical judgement   ADL                                              Extremity/Trunk Assessment              Vision       Perception     Praxis     Communication Communication Communication: No apparent difficulties    Cognition Arousal: Alert Behavior During Therapy: WFL for tasks assessed/performed Cognition: No apparent impairments                               Following commands: Intact        Cueing   Cueing Techniques: Verbal cues  Exercises Other Exercises Other Exercises: Pt/spouse educated in recovery process, continued goals of therapy, OT facilitated problem solving for continued progress    Shoulder Instructions       General Comments      Pertinent Vitals/ Pain       Pain Assessment Pain Assessment: No/denies pain  Home Living                                          Prior Functioning/Environment              Frequency  Min 3X/week        Progress Toward Goals  OT Goals(current goals can now be found in the care  plan section)  Progress towards OT goals: Progressing toward goals  Acute Rehab OT Goals Patient Stated Goal: go home OT Goal Formulation: With patient/family Time For Goal Achievement: 03/09/24 Potential to Achieve Goals: Good  Plan      Co-evaluation                 AM-PAC OT "6 Clicks" Daily Activity     Outcome Measure   Help from another person eating meals?: None Help from another person taking care of personal grooming?: None Help from another person toileting, which includes using toliet, bedpan, or urinal?: A Little Help from another person bathing (including washing, rinsing, drying)?: A Little Help from another person to put on and taking off regular upper body clothing?: A Little Help from another person to put on and taking off regular lower body clothing?: A Little 6 Click Score: 20    End of Session Equipment Utilized During Treatment: Oxygen   OT Visit Diagnosis: Unsteadiness on feet (R26.81);Repeated falls (R29.6);Muscle weakness (generalized) (M62.81)   Activity Tolerance Patient tolerated treatment well   Patient Left in bed;with call bell/phone within reach;with family/visitor  present   Nurse Communication Other (comment) (MD, TOC, RN - pt/spouse want an update on [facility])        Time: 1610-9604 OT Time Calculation (min): 33 min  Charges: OT General Charges $OT Visit: 1 Visit OT Treatments $Therapeutic Activity: 8-22 mins  Berenda Breaker., MPH, MS, OTR/L ascom [phone] 03/07/24, 4:36 PM

## 2024-03-07 NOTE — Discharge Summary (Signed)
 Physician Discharge Summary   Patient: Dillon [name] MRN: [id number] DOB: 30-Apr-1956  Admit date:     02/24/2024  Discharge date: 03/07/24  Discharge Physician: Donaciano Frizzle   PCP: Pcp, No   Recommendations at discharge:   Transferred to [facility] for CT surgery.  Discharge Diagnoses: Principal Problem:   Spontaneous pneumothorax Active Problems:   Acute on chronic hypoxic respiratory failure (HCC)   Coronary artery disease due to lipid rich plaque   Essential hypertension   Pulmonary fibrosis (HCC)   GERD without esophagitis   Chronic heart failure with preserved ejection fraction (HFpEF) (HCC) Mild thrombocytopenia. Resolved Problems:   * No resolved hospital problems. *  Hospital Course: Dillon [name] is a 68 y.o. male with medical history significant for COPD, pulmonary fibrosis, chronic hypoxic respiratory failure on 5 L/min oxygen , restless leg syndrome, GERD, hypertension, CAD, who presented to the hospital with acute onset of right-sided chest pain and shortness of breath.   He was found to have large right sided pneumothorax complicated by acute on chronic hypoxic respiratory failure.   He initially required BiPAP in the ED for acute respiratory failure. Chest tube was inserted in the right pleural space in the emergency room on 02/24/2024.  Assessment and Plan: Acute on chronic hypoxic respiratory failure (HCC) Spontaneous pneumothorax Acute decompensated respiratory status initially requiring BiPAP  in the setting of spontaneous large pneumothorax on chest x-ray with chronic pulmonary fibrosis and COPD Status post chest tube and pigtail catheter in the ER Patient followed by pulmonology.   Repeated chest x-ray no longer has any pneumothorax, however, patient still has her air leak.  Decision made to keep patient little longer before clamping the chest tube again.  May consider CT surgery if pneumothorax does not resolve.   Patient still has significant air  leak, not able to clamp the chest tube.  Discussed with pulmonology today, patient need to transfer to [facility] for CT surgery consult.   Pulmonary fibrosis. COPD. Condition stable, still on home dose of prednisone .   Coronary artery disease due to lipid rich plaque Condition stable.  Plavix  on hold  in case patient has CT surgery.   Essential hypertension BP stable Titrate home regimen   Chronic heart failure with preserved ejection fraction (HFpEF) (HCC) 2D echo January 2025 with EF of 55 to 60% and grade 1 diastolic dysfunction Appears euvolemic at present Patient is euvolemic.   GERD without esophagitis PPI       Consultants: Pulmonology. Procedures performed: Chest tube  Disposition: [facility]  Diet recommendation:  Discharge Diet Orders (From admission, onward)     Start     Ordered   03/07/24 0000  Diet - low sodium heart healthy        03/07/24 1014           Cardiac diet DISCHARGE MEDICATION: Allergies as of 03/07/2024       Reactions   Ace Inhibitors Swelling   Causes cough and swelling   Cinnamon Rash   Mouth rash        Medication List     STOP taking these medications    clopidogrel  75 MG tablet Commonly known as: PLAVIX        TAKE these medications    acetaminophen  500 MG tablet Commonly known as: TYLENOL  Take 1,000 mg by mouth every 8 (eight) hours as needed for moderate pain.   albuterol  (2.5 MG/3ML) 0.083% nebulizer solution Commonly known as: PROVENTIL  SMARTSIG:3 Milliliter(s) Via Nebulizer Every 6  Hours PRN   ALPHA LIPOIC ACID PO Take 500-1,000 mg by mouth daily.   atorvastatin  40 MG tablet Commonly known as: LIPITOR  Take 1 tablet (40 mg total) by mouth daily.   carvedilol  6.25 MG tablet Commonly known as: COREG  Take 1 tablet (6.25 mg total) by mouth 2 (two) times daily with a meal.   enoxaparin  40 MG/0.4ML injection Commonly known as: LOVENOX  Inject 0.4 mLs (40 mg total) into the skin daily.   Esbriet  267 MG  Caps Generic drug: Pirfenidone  Take 3 capsules by mouth 3 (three) times daily.   Fluticasone -Umeclidin-Vilant 100-62.5-25 MCG/ACT Aepb Commonly known as: Trelegy Ellipta Inhale 1 puff into the lungs daily.   nitroGLYCERIN  0.4 MG SL tablet Commonly known as: NITROSTAT  Place 1 tablet (0.4 mg total) under the tongue every 5 (five) minutes x 3 doses as needed for chest pain.   OXYGEN  Inhale 4 L into the lungs continuous.   pantoprazole  40 MG tablet Commonly known as: PROTONIX  Take 40 mg by mouth daily.   predniSONE  20 MG tablet Commonly known as: DELTASONE  Take 20 mg by mouth daily.   roflumilast  500 MCG Tabs tablet Commonly known as: DALIRESP  Take 1 tablet by mouth daily. Every other day   Tyvaso  Starter Kit 0.6 MG/ML Soln Generic drug: Treprostinil  Inhale 18 mcg into the lungs 4 (four) times daily.        Discharge Exam: There were no vitals filed for this visit. General exam: Appears calm and comfortable  Respiratory system: Decreased breath sounds. Respiratory effort normal. Cardiovascular system: S1 & S2 heard, RRR. No JVD, murmurs, rubs, gallops or clicks. No pedal edema. Gastrointestinal system: Abdomen is nondistended, soft and nontender. No organomegaly or masses felt. Normal bowel sounds heard. Central nervous system: Alert and oriented. No focal neurological deficits. Extremities: Symmetric 5 x 5 power. Skin: No rashes, lesions or ulcers Psychiatry: Judgement and insight appear normal. Mood & affect appropriate.    Condition at discharge: fair  The results of significant diagnostics from this hospitalization (including imaging, microbiology, ancillary and laboratory) are listed below for reference.   Imaging Studies: DG Chest Port 1 View Result Date: 03/03/2024 CLINICAL DATA:  Pneumothorax follow-up EXAM: PORTABLE CHEST 1 VIEW COMPARISON:  Mar 01, 2024 FINDINGS: Right pleural catheter in place without residual pneumothorax. Chronic interstitial lung disease  and COPD changes. IMPRESSION: Right pleural catheter in place without residual pneumothorax. No pneumothorax Electronically Signed   By: Fredrich Jefferson M.D.   On: 03/03/2024 09:54   DG Chest Port 1 View Result Date: 03/01/2024 CLINICAL DATA:  Follow-up pneumothorax. EXAM: PORTABLE CHEST 1 VIEW COMPARISON:  02/29/2024. FINDINGS: Right pigtail chest tube overlies the right lung base, changed in position compared to the prior exam. Increased size of a small right pneumothorax with apical pleural separation measuring up to 1.5 cm. Subcutaneous emphysema is again noted within the right chest wall. Similar appearance of chronic fibrotic lung disease. No sizable pleural effusion. IMPRESSION: 1. Increased size of a small right pneumothorax with right pigtail chest tube overlying the right lung base, which has changed in position since the prior exam. 2. Similar appearance of chronic fibrotic lung disease. Electronically Signed   By: Mannie Seek M.D.   On: 03/01/2024 14:27   DG Chest Port 1 View Result Date: 02/29/2024 CLINICAL DATA:  Follow up pneumothorax. EXAM: PORTABLE CHEST 1 VIEW COMPARISON:  Radiographs 02/28/2024 and 02/26/2024.  CT 02/17/2024. FINDINGS: 1521 hours. Right pigtail chest tube is unchanged in position. Small residual right apical pneumothorax is unchanged.  There is stable soft tissue emphysema within the right chest wall. The heart size and mediastinal contours are stable. Chronic fibrotic lung disease again noted without evidence of acute superimposed airspace disease. No significant pleural effusion. IMPRESSION: 1. Stable small residual right apical pneumothorax with chest tube in place. 2. Chronic fibrotic lung disease. No acute cardiopulmonary process. Electronically Signed   By: Elmon Hagedorn M.D.   On: 02/29/2024 19:17   DG Chest Port 1 View Result Date: 02/28/2024 CLINICAL DATA:  Pneumothorax. EXAM: PORTABLE CHEST 1 VIEW COMPARISON:  Radiograph earlier today FINDINGS: Decreased  right pneumothorax. Small residual persists at the apex. Pigtail catheter in the lateral chest wall coiled laterally. Underlying chronic lung disease with diffuse interstitial coarsening and subpleural reticulation. Stable heart size and mediastinal contours. No large pleural effusion. IMPRESSION: 1. Decreased right pneumothorax with small residual at the apex. Pigtail catheter in the lateral chest wall coiled laterally. 2. Underlying chronic lung disease. Electronically Signed   By: Chadwick Colonel M.D.   On: 02/28/2024 14:57   DG Chest Port 1 View Addendum Date: 02/28/2024 ADDENDUM REPORT: 02/28/2024 08:11 ADDENDUM: Critical Value/emergent results were called by telephone at the time of interpretation on 02/28/2024 at 8:11 am to provider Dr. Leory Rands, who verbally acknowledged these results. Electronically Signed   By: Kimberley Penman M.D.   On: 02/28/2024 08:11   Result Date: 02/28/2024 CLINICAL DATA:  Follow-up right pneumothorax. EXAM: PORTABLE CHEST 1 VIEW COMPARISON:  02/26/2024 FINDINGS: Right-sided pigtail thoracostomy tube is identified along the lateral aspect of the right lower lung. Recurrent moderate to large right pneumothorax is identified which measures 3.1 cm over the apex. Increased soft tissue gas within the right lateral chest wall. Stable cardiomediastinal contours. Signs of advanced chronic interstitial lung disease is again seen with diffuse reticular interstitial opacities throughout both lungs. IMPRESSION: 1. Recurrent moderate to large right pneumothorax. 2. Right-sided pigtail thoracostomy tube is identified along the lateral aspect of the right lower lung. 3. Signs of advanced chronic interstitial lung disease. Electronically Signed: By: Kimberley Penman M.D. On: 02/28/2024 07:57   DG Chest Port 1 View Result Date: 02/26/2024 CLINICAL DATA:  Pneumothorax on right. EXAM: PORTABLE CHEST 1 VIEW COMPARISON:  02/24/2024 FINDINGS: Again noted is a pigtail right chest tube along the lateral  aspect of the right hemithorax. Right pneumothorax has resolved. Coarse interstitial lung markings are again noted and compatible with chronic changes. Trachea is midline. Heart size is normal. IMPRESSION: Resolved right pneumothorax. Right chest tube remains in place. Electronically Signed   By: Elene Griffes M.D.   On: 02/26/2024 10:53   DG Chest Portable 1 View Result Date: 02/24/2024 CLINICAL DATA:  chest tube insertion. EXAM: PORTABLE CHEST 1 VIEW COMPARISON:  Radiograph from earlier the same day, 6:52 a.m. FINDINGS: Since the prior study, there is interval placement of right-sided pleural drainage catheter. There is interval decrease in the pneumothorax however, small to moderate pneumothorax persist, with underlying collapsed lung, suggesting component of trapped lung. Bilateral lungs appear hyperlucent with coarse bronchovascular markings, in keeping with COPD. Bilateral lungs otherwise appear clear. No dense consolidation or lung collapse. Bilateral costophrenic angles are clear. Stable cardio-mediastinal silhouette. No acute osseous abnormalities. The soft tissues are within normal limits. IMPRESSION: *Interval placement of right-sided pleural drainage catheter with interval decrease in the pneumothorax however, small to moderate pneumothorax persist, with underlying collapsed lung, suggesting component of trapped lung. Electronically Signed   By: Beula Brunswick M.D.   On: 02/24/2024 08:10   DG Chest Portable  1 View Result Date: 02/24/2024 CLINICAL DATA:  Shortness of breath, back and chest pain. History of COPD and pulmonary fibrosis. EXAM: PORTABLE CHEST 1 VIEW COMPARISON:  CT angio chest, abdomen and pelvis from 02/17/2024. FINDINGS: There is a large right-sided pneumothorax greater than 50%. Stable cardiomediastinal contours. Diffuse reticular interstitial opacities, scarring and architectural distortion identified. No pleural fluid. No consolidative change. Visualized osseous structures appear  grossly intact. IMPRESSION: 1. Large right-sided pneumothorax greater than 50%. 2. Chronic interstitial lung disease. Critical Value/emergent results were called by telephone at the time of interpretation on 02/24/2024 at 7:11 am to provider [facility] , who verbally acknowledged these results. Electronically Signed   By: Kimberley Penman M.D.   On: 02/24/2024 07:11   CT Angio Chest/Abd/Pel for Dissection W and/or Wo Contrast Result Date: 02/17/2024 CLINICAL DATA:  Central chest pain radiating to the back since yesterday with worsening shortness of breath. Hypoxia. EXAM: CT ANGIOGRAPHY CHEST, ABDOMEN AND PELVIS TECHNIQUE: Non-contrast CT of the chest was initially obtained. Multidetector CT imaging through the chest, abdomen and pelvis was performed using the standard protocol during bolus administration of intravenous contrast. Multiplanar reconstructed images and MIPs were obtained and reviewed to evaluate the vascular anatomy. RADIATION DOSE REDUCTION: This exam was performed according to the departmental dose-optimization program which includes automated exposure control, adjustment of the mA and/or kV according to patient size and/or use of iterative reconstruction technique. CONTRAST:  OMNIPAQUE  IOHEXOL  350 MG/ML SOLN COMPARISON:  Chest CTA dated 10/02/2023. PET-CT dated 11/12/2022. Chest, abdomen and pelvis CT dated 07/08/2021. FINDINGS: CTA CHEST FINDINGS Cardiovascular: Atheromatous calcifications, including the coronary arteries and aorta. Borderline enlarged heart. Normally opacified thoracic aorta without aneurysm or dissection. Enlarged right and left main pulmonary arteries with minimal enlargement of the main pulmonary artery segment with a diameter of 3.0 cm. No pulmonary arterial filling defects seen. Mediastinum/Nodes: No enlarged mediastinal, hilar, or axillary lymph nodes. Thyroid gland, trachea, and esophagus demonstrate no significant findings. Lungs/Pleura: Stable bilateral  interstitial fibrosis and centrilobular and paraseptal bullous changes. No airspace consolidation or pleural fluid. Musculoskeletal: Interval approximately 20% T9 vertebral superior endplate compression deformity with Schmorl's node formation. No visible acute fracture lines and no bony retropulsion. Mild thoracic and moderate lower cervical spine degenerative changes. Review of the MIP images confirms the above findings. CTA ABDOMEN AND PELVIS FINDINGS VASCULAR Aorta: Distal aortic atheromatous calcifications. 3.2 cm infrarenal abdominal aortic aneurysm with mild mural thrombus formation. The aorta previously measured 2.6 cm in maximum diameter on 07/08/2021. No dissection. Celiac: Tortuosity of the proximal celiac axis causing approximately 60% luminal narrowing without associated plaque formation at that location. There is minimal calcified plaque more distally without stenosis. SMA: Patent without evidence of aneurysm, dissection, vasculitis or significant stenosis. Renals: Both renal arteries are patent without evidence of aneurysm, dissection, vasculitis, fibromuscular dysplasia or significant stenosis. IMA: Patent without evidence of aneurysm, dissection, vasculitis or significant stenosis. Inflow: Atheromatous changes without significant stenosis. Veins: No obvious venous abnormality within the limitations of this arterial phase study. Review of the MIP images confirms the above findings. NON-VASCULAR Hepatobiliary: No focal liver abnormality is seen. No gallstones, gallbladder wall thickening, or biliary dilatation. Pancreas: Unremarkable. No pancreatic ductal dilatation or surrounding inflammatory changes. Spleen: Normal in size without focal abnormality. Adrenals/Urinary Tract: Adrenal glands are unremarkable. Kidneys are normal, without renal calculi, focal lesion, or hydronephrosis. Bladder is unremarkable. Stomach/Bowel: Sigmoid and descending colon diverticulosis without evidence of diverticulitis.  Normal-appearing appendix. Unremarkable small bowel and stomach. Lymphatic: No enlarged lymph nodes.  Reproductive: Prostate is unremarkable. Other: No abdominal wall hernia or abnormality. No abdominopelvic ascites. Musculoskeletal: Lumbar spine degenerative changes. Review of the MIP images confirms the above findings. IMPRESSION: 1. No aortic dissection or thoracic aneurysm. 2. 3.2 cm infrarenal abdominal aortic aneurysm, increased in size since 07/08/2021. Recommend follow-up ultrasound every 3 years. 3. Approximately 60% luminal narrowing of the proximal celiac axis due to tortuosity. 4. Stable bilateral interstitial fibrosis and centrilobular and paraseptal bullous emphysema. 5.  Calcific coronary artery and aortic atherosclerosis. 6. Sigmoid and descending colon diverticulosis. Aortic Atherosclerosis (ICD10-I70.0) and Emphysema (ICD10-J43.9). Electronically Signed   By: Catherin Closs M.D.   On: 02/17/2024 12:18    Microbiology: Results for orders placed or performed during the hospital encounter of 11/16/23  Resp panel by RT-PCR (RSV, Flu A&B, Covid) Anterior Nasal Swab     Status: None   Collection Time: 11/16/23  9:03 PM   Specimen: Anterior Nasal Swab  Result Value Ref Range Status   SARS Coronavirus 2 by RT PCR NEGATIVE NEGATIVE Final    Comment: (NOTE) SARS-CoV-2 target nucleic acids are NOT DETECTED.  The SARS-CoV-2 RNA is generally detectable in upper respiratory specimens during the acute phase of infection. The lowest concentration of SARS-CoV-2 viral copies this assay can detect is 138 copies/mL. A negative result does not preclude SARS-Cov-2 infection and should not be used as the sole basis for treatment or other patient management decisions. A negative result may occur with  improper specimen collection/handling, submission of specimen other than nasopharyngeal swab, presence of viral mutation(s) within the areas targeted by this assay, and inadequate number of  viral copies(<138 copies/mL). A negative result must be combined with clinical observations, patient history, and epidemiological information. The expected result is Negative.  Fact Sheet for Patients:  BloggerCourse.com  Fact Sheet for Healthcare Providers:  SeriousBroker.it  This test is no t yet approved or cleared by the [place]  FDA and  has been authorized for detection and/or diagnosis of SARS-CoV-2 by FDA under an Emergency Use Authorization (EUA). This EUA will remain  in effect (meaning this test can be used) for the duration of the COVID-19 declaration under Section 564(b)(1) of the Act, 21 U.S.C.section 360bbb-3(b)(1), unless the authorization is terminated  or revoked sooner.       Influenza A by PCR NEGATIVE NEGATIVE Final   Influenza B by PCR NEGATIVE NEGATIVE Final    Comment: (NOTE) The Xpert Xpress SARS-CoV-2/FLU/RSV plus assay is intended as an aid in the diagnosis of influenza from Nasopharyngeal swab specimens and should not be used as a sole basis for treatment. Nasal washings and aspirates are unacceptable for Xpert Xpress SARS-CoV-2/FLU/RSV testing.  Fact Sheet for Patients: BloggerCourse.com  Fact Sheet for Healthcare Providers: SeriousBroker.it  This test is not yet approved or cleared by the [place]  FDA and has been authorized for detection and/or diagnosis of SARS-CoV-2 by FDA under an Emergency Use Authorization (EUA). This EUA will remain in effect (meaning this test can be used) for the duration of the COVID-19 declaration under Section 564(b)(1) of the Act, 21 U.S.C. section 360bbb-3(b)(1), unless the authorization is terminated or revoked.     Resp Syncytial Virus by PCR NEGATIVE NEGATIVE Final    Comment: (NOTE) Fact Sheet for Patients: BloggerCourse.com  Fact Sheet for Healthcare  Providers: SeriousBroker.it  This test is not yet approved or cleared by the [place]  FDA and has been authorized for detection and/or diagnosis of SARS-CoV-2 by FDA under an Emergency Use Authorization (EUA). This EUA  will remain in effect (meaning this test can be used) for the duration of the COVID-19 declaration under Section 564(b)(1) of the Act, 21 U.S.C. section 360bbb-3(b)(1), unless the authorization is terminated or revoked.  Performed at [facility], [address] [place], [place] [address]   Respiratory (~20 pathogens) panel by PCR     Status: None   Collection Time: 11/17/23  9:43 AM   Specimen: Nasopharyngeal Swab; Respiratory  Result Value Ref Range Status   Adenovirus NOT DETECTED NOT DETECTED Final   Coronavirus 229E NOT DETECTED NOT DETECTED Final    Comment: (NOTE) The Coronavirus on the Respiratory Panel, DOES NOT test for the novel  Coronavirus (2019 nCoV)    Coronavirus HKU1 NOT DETECTED NOT DETECTED Final   Coronavirus NL63 NOT DETECTED NOT DETECTED Final   Coronavirus OC43 NOT DETECTED NOT DETECTED Final   Metapneumovirus NOT DETECTED NOT DETECTED Final   Rhinovirus / Enterovirus NOT DETECTED NOT DETECTED Final   Influenza A NOT DETECTED NOT DETECTED Final   Influenza B NOT DETECTED NOT DETECTED Final   Parainfluenza Virus 1 NOT DETECTED NOT DETECTED Final   Parainfluenza Virus 2 NOT DETECTED NOT DETECTED Final   Parainfluenza Virus 3 NOT DETECTED NOT DETECTED Final   Parainfluenza Virus 4 NOT DETECTED NOT DETECTED Final   Respiratory Syncytial Virus NOT DETECTED NOT DETECTED Final   Bordetella pertussis NOT DETECTED NOT DETECTED Final   Bordetella Parapertussis NOT DETECTED NOT DETECTED Final   Chlamydophila pneumoniae NOT DETECTED NOT DETECTED Final   Mycoplasma pneumoniae NOT DETECTED NOT DETECTED Final    Comment: Performed at [facility], [address], [place], [place] [address]     Labs: CBC: Recent Labs  Lab 03/02/24 0456  WBC 8.2  HGB 14.3  HCT 40.8  MCV 100.0  PLT 146*   Basic Metabolic Panel: Recent Labs  Lab 03/02/24 0456  NA 136  K 4.1  CL 103  CO2 27  GLUCOSE 86  BUN 21  CREATININE 0.64  CALCIUM  9.0   Liver Function Tests: No results for input(s): "AST", "ALT", "ALKPHOS", "BILITOT", "PROT", "ALBUMIN" in the last 168 hours. CBG: No results for input(s): "GLUCAP" in the last 168 hours.  Discharge time spent: greater than 30 minutes.  Signed: Donaciano Frizzle, MD [facility] 03/07/2024

## 2024-03-07 NOTE — Progress Notes (Signed)
 PULMONOLOGY         Date: 03/07/2024,   MRN# [id number] Dillon [name] [facility] 12-17-1955     Admission                  Current   Referring provider: Dr Daisey Dryer   CHIEF COMPLAINT:   Pneumothorax   HISTORY OF PRESENT ILLNESS   Dillon [name] is a 68 y.o. male with medical history significant of chronic respiratory failure on 4-5 L, COPD, GERD, pulmonary fibrosis, restless leg syndrome and coronary artery disease presenting with acute on chronic respiratory failure with hypoxia and pneumothorax.  Patient reports acute onset of severe right-sided chest pain and shortness of breath earlier this morning.  He reports pain started when he was getting prepared for work on Tuesday and as he was taking shirt off to get into shower he felt severe pain suddenly. No focal hemiparesis or confusion.  No abdominal pain.  Positive mild right-sided chest pain with deep breathing and movement.  Has been compliant otherwise with home respiratory regimen.   Presented to the ER afebrile, heart rate into the 100s, initially requiring BiPAP.  Noted moderate to large right-sided pneumothorax on chest x-ray.  Chest tube with pigtail catheter placed by ER physician. Patient is now improved but still has some residual pain on right hemithorax. PCCM consultation for further management of secondary spontaneous pneumothorax.   02/26/24- patient with resolution of pneumothorax on cxr.  Will clamp tube overnight and repeat cxr in am for preparation to remove chest tube.     02/27/24- patient with no overnight events.  I have clamped chest tube and we will repeat cxr in am in preparation for chest tube removal.    02/28/24- patient with recurrence of pneumothorax overnight after clamping the chest tube overnight. We have repeated cxr this am and then re-opened chest tube to suction.  Ive discussed with patient plan to keep chest tube open to suction.  There is no air leak noted on chest tube management.   02/29/24-  patient improved and is sitting in chair OOB.  Daughter and wife at bedside. He still has + air leak on chest tube evaluation and requires suction to atrium.   03/01/24- patient seen at bedside.  Stable on 7L/min [place].  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 If fistula persists he may need thoracic surgery evaluation.   03/03/24- patient still has air leak this morning.  He feels anxious about closing off tube and I think its reasonable to give him more time to heal bronchopleural fistula.  He may need thoracic surgery evaluation.   03/04/24- patient was seen at bedside, he still has positive air leak and chest discomfort.  He may need few more days to allow bronchopleural fistula to heal.  Alternatively he may need evaluation by thoracic surgery.   03/07/24- patient was seen at bedside this am.  He shares overnight he inadvertantly kinked the pigtail catheter and started to feel chest pressure with sob at rest.  He repositioned himself and noted prolonged bubbling in atrium with subsequent relief of chest pain.  He still has + air leak on forced expiratory maneuver.  Despite 12 days of hospitalization and resolution of pneumothorax with suction via pleural catheter his bronchopleural fistula is still active unhealed. We discussed thoracic surgery evaluation as next step and he would prefer to have this done at  this point. Dr Jeane Miguel was able to join us  in conversation and we made medical plan to contact [facility] for transfer today.   PAST MEDICAL HISTORY   Past Medical History:  Diagnosis Date   Chronic cough    COPD (chronic obstructive pulmonary disease) (HCC)    Coronary artery disease    Edentulous    Elevated cholesterol    GERD (gastroesophageal reflux disease)    Myocardial infarction (HCC)    Pulmonary fibrosis (HCC)    Restless leg syndrome    Supplemental oxygen  dependent      SURGICAL HISTORY    Past Surgical History:  Procedure Laterality Date   ANGIOPLASTY  2010   2 stents placed   BACK SURGERY     cardiac stents     x2   CATARACT EXTRACTION W/PHACO Left 08/20/2023   Procedure: CATARACT EXTRACTION PHACO AND INTRAOCULAR LENS PLACEMENT (IOC) LEFT 16.87 01:21.8;  Surgeon: Trudi Fus, MD;  Location: [facility];  Service: Ophthalmology;  Laterality: Left;   CATARACT EXTRACTION W/PHACO Right 09/03/2023   Procedure: CATARACT EXTRACTION PHACO AND INTRAOCULAR LENS PLACEMENT (IOC) RIGHT 19.41 01:30.0;  Surgeon: Trudi Fus, MD;  Location: [facility];  Service: Ophthalmology;  Laterality: Right;   COLONOSCOPY     COLONOSCOPY WITH PROPOFOL  N/A 03/05/2018   Procedure: COLONOSCOPY WITH PROPOFOL ;  Surgeon: Irby Mannan, MD;  Location: [facility] ENDOSCOPY;  Service: Endoscopy;  Laterality: N/A;   COLONOSCOPY WITH PROPOFOL  N/A 01/24/2022   Procedure: COLONOSCOPY WITH PROPOFOL ;  Surgeon: Luke Salaam, MD;  Location: [facility] ENDOSCOPY;  Service: Gastroenterology;  Laterality: N/A;   CORONARY ANGIOPLASTY     CORONARY/GRAFT ACUTE MI REVASCULARIZATION N/A 09/30/2019   Procedure: Coronary/Graft Acute MI Revascularization;  Surgeon: Wenona Hamilton, MD;  Location: [facility] INVASIVE CV LAB;  Service: Cardiovascular;  Laterality: N/A;   LEFT HEART CATH AND CORONARY ANGIOGRAPHY N/A 09/30/2019   Procedure: LEFT HEART CATH AND CORONARY ANGIOGRAPHY;  Surgeon: Wenona Hamilton, MD;  Location: [facility] INVASIVE CV LAB;  Service: Cardiovascular;  Laterality: N/A;   LEFT HEART CATH AND CORS/GRAFTS ANGIOGRAPHY N/A 10/01/2021   Procedure: LEFT HEART CATH AND CORS/GRAFTS ANGIOGRAPHY;  Surgeon: Sammy Crisp, MD;  Location: [facility] INVASIVE CV LAB;  Service: Cardiovascular;  Laterality: N/A;     FAMILY HISTORY   Family History  Problem Relation Age of Onset   Heart disease Mother    Heart disease Father      SOCIAL HISTORY   Social History   Tobacco Use   Smoking  status: Former    Current packs/day: 0.00    Average packs/day: 2.0 packs/day for 40.0 years (80.0 ttl pk-yrs)    Types: Cigarettes    Start date: 52    Quit date: 2013    Years since quitting: 12.3   Smokeless tobacco: Never  Vaping Use   Vaping status: Never Used  Substance Use Topics   Alcohol  use: Not Currently    Comment: occassionally - 1 beer/month or less   Drug use: Not Currently     MEDICATIONS    Home Medication:  Current Outpatient Rx   Order #: [id number] Class: No Print    Current Medication:  Current Facility-Administered Medications:    acetaminophen  (TYLENOL ) tablet 650 mg, 650 mg, Oral, Q6H PRN, Sheril Dines, MD, 650 mg at 03/07/24 0705   albuterol  (PROVENTIL ) (2.5 MG/3ML) 0.083% nebulizer solution 2.5 mg, 2.5 mg, Nebulization, BID, Ayiku, Bernard, MD, 2.5 mg at 03/07/24 0729   atorvastatin  (LIPITOR ) tablet 40 mg, 40 mg,  Oral, Daily, Sheril Dines, MD, 40 mg at 03/07/24 1610   budeson-glycopyrrolate -formoterol  (BREZTRI ) 160-9-4.8 MCG/ACT inhaler 2 puff, 2 puff, Inhalation, BID, Sheril Dines, MD, 2 puff at 03/07/24 0810   carvedilol  (COREG ) tablet 6.25 mg, 6.25 mg, Oral, BID WC, Sheril Dines, MD, 6.25 mg at 03/07/24 0809   enoxaparin  (LOVENOX ) injection 40 mg, 40 mg, Subcutaneous, Q24H, Corrinne Din, MD, 40 mg at 03/06/24 2201   HYDROmorphone  (DILAUDID ) injection 1 mg, 1 mg, Intravenous, Q2H PRN, Corrinne Din, MD, 1 mg at 02/28/24 9604   ondansetron  (ZOFRAN ) tablet 4 mg, 4 mg, Oral, Q6H PRN **OR** ondansetron  (ZOFRAN ) injection 4 mg, 4 mg, Intravenous, Q6H PRN, Corrinne Din, MD, 4 mg at 02/24/24 2257   pantoprazole  (PROTONIX ) EC tablet 40 mg, 40 mg, Oral, Daily, Sheril Dines, MD, 40 mg at 03/07/24 0809   Pirfenidone  TABS 801 mg, 801 mg, Oral, TID AC, Ayiku, Bernard, MD, 801 mg at 03/07/24 1142   predniSONE  (DELTASONE ) tablet 20 mg, 20 mg, Oral, Daily, Sheril Dines, MD, 20 mg at 03/07/24 5409   roflumilast  (DALIRESP ) tablet 500 mcg, 500  mcg, Oral, Daily, Sheril Dines, MD, 500 mcg at 03/07/24 0809   Treprostinil  (TYVASO ) inhalation solution 18 mcg, 18 mcg, Inhalation, QID, Sheril Dines, MD, 18 mcg at 03/05/24 2000    ALLERGIES   Ace inhibitors and Cinnamon     REVIEW OF SYSTEMS    Review of Systems:  Gen:  Denies  fever, sweats, chills weigh loss  HEENT: Denies blurred vision, double vision, ear pain, eye pain, hearing loss, nose bleeds, sore throat Cardiac:  No dizziness, chest pain or heaviness, chest tightness,edema Resp:   reports dyspnea chronically  Gi: Denies swallowing difficulty, stomach pain, nausea or vomiting, diarrhea, constipation, bowel incontinence Gu:  Denies bladder incontinence, burning urine Ext:   Denies Joint pain, stiffness or swelling Skin: Denies  skin rash, easy bruising or bleeding or hives Endoc:  Denies polyuria, polydipsia , polyphagia or weight change Psych:   Denies depression, insomnia or hallucinations   Other:  All other systems negative   VS: BP 122/63 (BP Location: Left Arm)   Pulse 81   Temp 98.9 F (37.2 C)   Resp 18   SpO2 94%      PHYSICAL EXAM    GENERAL:NAD, no fevers, chills, no weakness no fatigue HEAD: Normocephalic, atraumatic.  EYES: Pupils equal, round, reactive to light. Extraocular muscles intact. No scleral icterus.  MOUTH: Moist mucosal membrane. Dentition intact. No abscess noted.  EAR, NOSE, THROAT: Clear without exudates. No external lesions.  NECK: Supple. No thyromegaly. No nodules. No JVD.  PULMONARY: decreased breath sounds with mild rhonchi worse at bases bilaterally. +R chest tube with +air leak CARDIOVASCULAR: S1 and S2. Regular rate and rhythm. No murmurs, rubs, or gallops. No edema. Pedal pulses 2+ bilaterally.  GASTROINTESTINAL: Soft, nontender, nondistended. No masses. Positive bowel sounds. No hepatosplenomegaly.  MUSCULOSKELETAL: No swelling, clubbing, or edema. Range of motion full in all extremities.  NEUROLOGIC: Cranial  nerves II through XII are intact. No gross focal neurological deficits. Sensation intact. Reflexes intact.  SKIN: No ulceration, lesions, rashes, or cyanosis. Skin warm and dry. Turgor intact.  PSYCHIATRIC: Mood, affect within normal limits. The patient is awake, alert and oriented x 3. Insight, judgment intact.       IMAGING  Reviewed CXR and CT with patient and family at bedside       ASSESSMENT/PLAN   Secondary spontaneous pneumothorax                -  chest tube on right - mild to moderate pain and tenderness- on PRN dilaudid              - cxr with residual moderate pneumothorax remaining    - continue chest tube to negative 20 cm H20    -repeat CXR - chest tube management - +air leak     -thoracic surgery evaluation for Bronchopleural fistula  2. 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 Bibasilar atelectasis             IS at bedside    4. 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 Physical deconditioning       PT/OT when able   6. GERD      - no aspiration events - continue protonix          Thank you for allowing me to participate in the care of this patient.   Patient/Family are satisfied with care plan and all questions have been answered.    Provider disclosure: Patient with at least one acute or chronic illness or injury that poses a threat to life or bodily function and is being managed actively during this encounter.  All of the below services have been performed independently by signing provider:  review of prior documentation from internal and or external health records.  Review of previous and current lab results.  Interview and comprehensive assessment during patient visit today. Review of current and previous chest radiographs/CT scans.  Discussion of management and test interpretation with health care team and patient/family.   This document was prepared using Dragon voice recognition software and may include unintentional dictation errors.     Bonham Zingale, M.D.  Division of Pulmonary & Critical Care Medicine

## 2024-03-07 NOTE — Progress Notes (Signed)
 [facility] called to inform that patient has a bed. Hospitalist notified because there is no order to discharge. All orders place to discharge to [facility] per hospitalist ( Dr. Achilles Holes). Patient going to room 4712, report called to Maysa,RN. [facility] (ground) at [employer] made aware. Patient on the list to be transferred, possible in the morning before [facility] can arrive. Will call [facility] to see if patient can be transferred sooner.

## 2024-03-08 DIAGNOSIS — I10 Essential (primary) hypertension: Secondary | ICD-10-CM | POA: Diagnosis not present

## 2024-03-08 DIAGNOSIS — J9621 Acute and chronic respiratory failure with hypoxia: Secondary | ICD-10-CM | POA: Diagnosis not present

## 2024-03-08 DIAGNOSIS — J9383 Other pneumothorax: Secondary | ICD-10-CM | POA: Diagnosis not present

## 2024-03-08 NOTE — Discharge Summary (Signed)
 Physician Discharge Summary   Patient: Dillon [name] MRN: [id number] DOB: Jan 03, 1956  Admit date:     02/24/2024  Discharge date: 03/08/24  Discharge Physician: Donaciano Frizzle   PCP: Pcp, No   Recommendations at discharge:    Transfer to [facility]  Discharge Diagnoses: Principal Problem:   Spontaneous pneumothorax Active Problems:   Acute on chronic hypoxic respiratory failure (HCC)   Coronary artery disease due to lipid rich plaque   Essential hypertension   Pulmonary fibrosis (HCC)   GERD without esophagitis   Chronic heart failure with preserved ejection fraction (HFpEF) (HCC)  Resolved Problems:   * No resolved hospital problems. *  Hospital Course: Dillon [name] is a 68 y.o. male with medical history significant for COPD, pulmonary fibrosis, chronic hypoxic respiratory failure on 5 L/min oxygen , restless leg syndrome, GERD, hypertension, CAD, who presented to the hospital with acute onset of right-sided chest pain and shortness of breath.   He was found to have large right sided pneumothorax complicated by acute on chronic hypoxic respiratory failure.   He initially required BiPAP in the ED for acute respiratory failure. Chest tube was inserted in the right pleural space in the emergency room on 02/24/2024.  Assessment and Plan: Acute on chronic hypoxic respiratory failure (HCC) Spontaneous pneumothorax Acute decompensated respiratory status initially requiring BiPAP  in the setting of spontaneous large pneumothorax on chest x-ray with chronic pulmonary fibrosis and COPD Status post chest tube and pigtail catheter in the ER Patient followed by pulmonology.   Repeated chest x-ray no longer has any pneumothorax, however, patient still has her air leak.  Decision made to keep patient little longer before clamping the chest tube again.  May consider CT surgery if pneumothorax does not resolve.   Patient still has significant air leak, not able to clamp the chest tube.   Discussed with pulmonology today, patient need to transfer to [facility] for CT surgery consult. But [facility] denied, patient is transported to [facility] instead. Still medically stable for transport.  Called [facility], transport confirmed.    Pulmonary fibrosis. COPD. Condition stable, still on home dose of prednisone .   Coronary artery disease due to lipid rich plaque Condition stable.  Plavix  on hold  in case patient has CT surgery.   Essential hypertension BP stable Titrate home regimen   Chronic heart failure with preserved ejection fraction (HFpEF) (HCC) 2D echo January 2025 with EF of 55 to 60% and grade 1 diastolic dysfunction Appears euvolemic at present Patient is euvolemic.   GERD without esophagitis PPI         Consultants: Pulmonology Procedures performed: Chest Tube  Disposition: [facility] Diet recommendation:  Discharge Diet Orders (From admission, onward)     Start     Ordered   03/07/24 0000  Diet - low sodium heart healthy        03/07/24 1014           Cardiac diet DISCHARGE MEDICATION: Allergies as of 03/08/2024       Reactions   Ace Inhibitors Swelling   Causes cough and swelling   Cinnamon Rash   Mouth rash        Medication List     STOP taking these medications    clopidogrel  75 MG tablet Commonly known as: PLAVIX        TAKE these medications    acetaminophen  500 MG tablet Commonly known as: TYLENOL  Take 1,000 mg by mouth every 8 (eight) hours as needed for moderate pain.  albuterol  (2.5 MG/3ML) 0.083% nebulizer solution Commonly known as: PROVENTIL  SMARTSIG:3 Milliliter(s) Via Nebulizer Every 6 Hours PRN   ALPHA LIPOIC ACID PO Take 500-1,000 mg by mouth daily.   atorvastatin  40 MG tablet Commonly known as: LIPITOR  Take 1 tablet (40 mg total) by mouth daily.   carvedilol  6.25 MG tablet Commonly known as: COREG  Take 1 tablet (6.25 mg total) by mouth 2 (two) times daily with a meal.   enoxaparin  40 MG/0.4ML  injection Commonly known as: LOVENOX  Inject 0.4 mLs (40 mg total) into the skin daily.   Esbriet  267 MG Caps Generic drug: Pirfenidone  Take 3 capsules by mouth 3 (three) times daily.   Fluticasone -Umeclidin-Vilant 100-62.5-25 MCG/ACT Aepb Commonly known as: Trelegy Ellipta Inhale 1 puff into the lungs daily.   nitroGLYCERIN  0.4 MG SL tablet Commonly known as: NITROSTAT  Place 1 tablet (0.4 mg total) under the tongue every 5 (five) minutes x 3 doses as needed for chest pain.   OXYGEN  Inhale 4 L into the lungs continuous.   pantoprazole  40 MG tablet Commonly known as: PROTONIX  Take 40 mg by mouth daily.   predniSONE  20 MG tablet Commonly known as: DELTASONE  Take 20 mg by mouth daily.   roflumilast  500 MCG Tabs tablet Commonly known as: DALIRESP  Take 1 tablet by mouth daily. Every other day   Tyvaso  Starter Kit 0.6 MG/ML Soln Generic drug: Treprostinil  Inhale 18 mcg into the lungs 4 (four) times daily.        Discharge Exam: There were no vitals filed for this visit. General exam: Appears calm and comfortable  Respiratory system: Crackles in the base. Respiratory effort normal. Cardiovascular system: S1 & S2 heard, RRR. No JVD, murmurs, rubs, gallops or clicks. No pedal edema. Gastrointestinal system: Abdomen is nondistended, soft and nontender. No organomegaly or masses felt. Normal bowel sounds heard. Central nervous system: Alert and oriented. No focal neurological deficits. Extremities: Symmetric 5 x 5 power. Skin: No rashes, lesions or ulcers Psychiatry: Judgement and insight appear normal. Mood & affect appropriate.    Condition at discharge: good  The results of significant diagnostics from this hospitalization (including imaging, microbiology, ancillary and laboratory) are listed below for reference.   Imaging Studies: DG Chest Port 1 View Result Date: 03/03/2024 CLINICAL DATA:  Pneumothorax follow-up EXAM: PORTABLE CHEST 1 VIEW COMPARISON:  Mar 01, 2024  FINDINGS: Right pleural catheter in place without residual pneumothorax. Chronic interstitial lung disease and COPD changes. IMPRESSION: Right pleural catheter in place without residual pneumothorax. No pneumothorax Electronically Signed   By: Fredrich Jefferson M.D.   On: 03/03/2024 09:54   DG Chest Port 1 View Result Date: 03/01/2024 CLINICAL DATA:  Follow-up pneumothorax. EXAM: PORTABLE CHEST 1 VIEW COMPARISON:  02/29/2024. FINDINGS: Right pigtail chest tube overlies the right lung base, changed in position compared to the prior exam. Increased size of a small right pneumothorax with apical pleural separation measuring up to 1.5 cm. Subcutaneous emphysema is again noted within the right chest wall. Similar appearance of chronic fibrotic lung disease. No sizable pleural effusion. IMPRESSION: 1. Increased size of a small right pneumothorax with right pigtail chest tube overlying the right lung base, which has changed in position since the prior exam. 2. Similar appearance of chronic fibrotic lung disease. Electronically Signed   By: Mannie Seek M.D.   On: 03/01/2024 14:27   DG Chest Port 1 View Result Date: 02/29/2024 CLINICAL DATA:  Follow up pneumothorax. EXAM: PORTABLE CHEST 1 VIEW COMPARISON:  Radiographs 02/28/2024 and 02/26/2024.  CT 02/17/2024. FINDINGS:  1521 hours. Right pigtail chest tube is unchanged in position. Small residual right apical pneumothorax is unchanged. There is stable soft tissue emphysema within the right chest wall. The heart size and mediastinal contours are stable. Chronic fibrotic lung disease again noted without evidence of acute superimposed airspace disease. No significant pleural effusion. IMPRESSION: 1. Stable small residual right apical pneumothorax with chest tube in place. 2. Chronic fibrotic lung disease. No acute cardiopulmonary process. Electronically Signed   By: Elmon Hagedorn M.D.   On: 02/29/2024 19:17   DG Chest Port 1 View Result Date: 02/28/2024 CLINICAL  DATA:  Pneumothorax. EXAM: PORTABLE CHEST 1 VIEW COMPARISON:  Radiograph earlier today FINDINGS: Decreased right pneumothorax. Small residual persists at the apex. Pigtail catheter in the lateral chest wall coiled laterally. Underlying chronic lung disease with diffuse interstitial coarsening and subpleural reticulation. Stable heart size and mediastinal contours. No large pleural effusion. IMPRESSION: 1. Decreased right pneumothorax with small residual at the apex. Pigtail catheter in the lateral chest wall coiled laterally. 2. Underlying chronic lung disease. Electronically Signed   By: Chadwick Colonel M.D.   On: 02/28/2024 14:57   DG Chest Port 1 View Addendum Date: 02/28/2024 ADDENDUM REPORT: 02/28/2024 08:11 ADDENDUM: Critical Value/emergent results were called by telephone at the time of interpretation on 02/28/2024 at 8:11 am to provider Dr. Leory Rands, who verbally acknowledged these results. Electronically Signed   By: Kimberley Penman M.D.   On: 02/28/2024 08:11   Result Date: 02/28/2024 CLINICAL DATA:  Follow-up right pneumothorax. EXAM: PORTABLE CHEST 1 VIEW COMPARISON:  02/26/2024 FINDINGS: Right-sided pigtail thoracostomy tube is identified along the lateral aspect of the right lower lung. Recurrent moderate to large right pneumothorax is identified which measures 3.1 cm over the apex. Increased soft tissue gas within the right lateral chest wall. Stable cardiomediastinal contours. Signs of advanced chronic interstitial lung disease is again seen with diffuse reticular interstitial opacities throughout both lungs. IMPRESSION: 1. Recurrent moderate to large right pneumothorax. 2. Right-sided pigtail thoracostomy tube is identified along the lateral aspect of the right lower lung. 3. Signs of advanced chronic interstitial lung disease. Electronically Signed: By: Kimberley Penman M.D. On: 02/28/2024 07:57   DG Chest Port 1 View Result Date: 02/26/2024 CLINICAL DATA:  Pneumothorax on right. EXAM: PORTABLE  CHEST 1 VIEW COMPARISON:  02/24/2024 FINDINGS: Again noted is a pigtail right chest tube along the lateral aspect of the right hemithorax. Right pneumothorax has resolved. Coarse interstitial lung markings are again noted and compatible with chronic changes. Trachea is midline. Heart size is normal. IMPRESSION: Resolved right pneumothorax. Right chest tube remains in place. Electronically Signed   By: Elene Griffes M.D.   On: 02/26/2024 10:53   DG Chest Portable 1 View Result Date: 02/24/2024 CLINICAL DATA:  chest tube insertion. EXAM: PORTABLE CHEST 1 VIEW COMPARISON:  Radiograph from earlier the same day, 6:52 a.m. FINDINGS: Since the prior study, there is interval placement of right-sided pleural drainage catheter. There is interval decrease in the pneumothorax however, small to moderate pneumothorax persist, with underlying collapsed lung, suggesting component of trapped lung. Bilateral lungs appear hyperlucent with coarse bronchovascular markings, in keeping with COPD. Bilateral lungs otherwise appear clear. No dense consolidation or lung collapse. Bilateral costophrenic angles are clear. Stable cardio-mediastinal silhouette. No acute osseous abnormalities. The soft tissues are within normal limits. IMPRESSION: *Interval placement of right-sided pleural drainage catheter with interval decrease in the pneumothorax however, small to moderate pneumothorax persist, with underlying collapsed lung, suggesting component of trapped lung. Electronically Signed  By: Beula Brunswick M.D.   On: 02/24/2024 08:10   DG Chest Portable 1 View Result Date: 02/24/2024 CLINICAL DATA:  Shortness of breath, back and chest pain. History of COPD and pulmonary fibrosis. EXAM: PORTABLE CHEST 1 VIEW COMPARISON:  CT angio chest, abdomen and pelvis from 02/17/2024. FINDINGS: There is a large right-sided pneumothorax greater than 50%. Stable cardiomediastinal contours. Diffuse reticular interstitial opacities, scarring and architectural  distortion identified. No pleural fluid. No consolidative change. Visualized osseous structures appear grossly intact. IMPRESSION: 1. Large right-sided pneumothorax greater than 50%. 2. Chronic interstitial lung disease. Critical Value/emergent results were called by telephone at the time of interpretation on 02/24/2024 at 7:11 am to provider [facility] , who verbally acknowledged these results. Electronically Signed   By: Kimberley Penman M.D.   On: 02/24/2024 07:11   CT Angio Chest/Abd/Pel for Dissection W and/or Wo Contrast Result Date: 02/17/2024 CLINICAL DATA:  Central chest pain radiating to the back since yesterday with worsening shortness of breath. Hypoxia. EXAM: CT ANGIOGRAPHY CHEST, ABDOMEN AND PELVIS TECHNIQUE: Non-contrast CT of the chest was initially obtained. Multidetector CT imaging through the chest, abdomen and pelvis was performed using the standard protocol during bolus administration of intravenous contrast. Multiplanar reconstructed images and MIPs were obtained and reviewed to evaluate the vascular anatomy. RADIATION DOSE REDUCTION: This exam was performed according to the departmental dose-optimization program which includes automated exposure control, adjustment of the mA and/or kV according to patient size and/or use of iterative reconstruction technique. CONTRAST:  OMNIPAQUE  IOHEXOL  350 MG/ML SOLN COMPARISON:  Chest CTA dated 10/02/2023. PET-CT dated 11/12/2022. Chest, abdomen and pelvis CT dated 07/08/2021. FINDINGS: CTA CHEST FINDINGS Cardiovascular: Atheromatous calcifications, including the coronary arteries and aorta. Borderline enlarged heart. Normally opacified thoracic aorta without aneurysm or dissection. Enlarged right and left main pulmonary arteries with minimal enlargement of the main pulmonary artery segment with a diameter of 3.0 cm. No pulmonary arterial filling defects seen. Mediastinum/Nodes: No enlarged mediastinal, hilar, or axillary lymph nodes. Thyroid  gland, trachea, and esophagus demonstrate no significant findings. Lungs/Pleura: Stable bilateral interstitial fibrosis and centrilobular and paraseptal bullous changes. No airspace consolidation or pleural fluid. Musculoskeletal: Interval approximately 20% T9 vertebral superior endplate compression deformity with Schmorl's node formation. No visible acute fracture lines and no bony retropulsion. Mild thoracic and moderate lower cervical spine degenerative changes. Review of the MIP images confirms the above findings. CTA ABDOMEN AND PELVIS FINDINGS VASCULAR Aorta: Distal aortic atheromatous calcifications. 3.2 cm infrarenal abdominal aortic aneurysm with mild mural thrombus formation. The aorta previously measured 2.6 cm in maximum diameter on 07/08/2021. No dissection. Celiac: Tortuosity of the proximal celiac axis causing approximately 60% luminal narrowing without associated plaque formation at that location. There is minimal calcified plaque more distally without stenosis. SMA: Patent without evidence of aneurysm, dissection, vasculitis or significant stenosis. Renals: Both renal arteries are patent without evidence of aneurysm, dissection, vasculitis, fibromuscular dysplasia or significant stenosis. IMA: Patent without evidence of aneurysm, dissection, vasculitis or significant stenosis. Inflow: Atheromatous changes without significant stenosis. Veins: No obvious venous abnormality within the limitations of this arterial phase study. Review of the MIP images confirms the above findings. NON-VASCULAR Hepatobiliary: No focal liver abnormality is seen. No gallstones, gallbladder wall thickening, or biliary dilatation. Pancreas: Unremarkable. No pancreatic ductal dilatation or surrounding inflammatory changes. Spleen: Normal in size without focal abnormality. Adrenals/Urinary Tract: Adrenal glands are unremarkable. Kidneys are normal, without renal calculi, focal lesion, or hydronephrosis. Bladder is unremarkable.  Stomach/Bowel: Sigmoid and descending colon diverticulosis without  evidence of diverticulitis. Normal-appearing appendix. Unremarkable small bowel and stomach. Lymphatic: No enlarged lymph nodes. Reproductive: Prostate is unremarkable. Other: No abdominal wall hernia or abnormality. No abdominopelvic ascites. Musculoskeletal: Lumbar spine degenerative changes. Review of the MIP images confirms the above findings. IMPRESSION: 1. No aortic dissection or thoracic aneurysm. 2. 3.2 cm infrarenal abdominal aortic aneurysm, increased in size since 07/08/2021. Recommend follow-up ultrasound every 3 years. 3. Approximately 60% luminal narrowing of the proximal celiac axis due to tortuosity. 4. Stable bilateral interstitial fibrosis and centrilobular and paraseptal bullous emphysema. 5.  Calcific coronary artery and aortic atherosclerosis. 6. Sigmoid and descending colon diverticulosis. Aortic Atherosclerosis (ICD10-I70.0) and Emphysema (ICD10-J43.9). Electronically Signed   By: Catherin Closs M.D.   On: 02/17/2024 12:18    Microbiology: Results for orders placed or performed during the hospital encounter of 11/16/23  Resp panel by RT-PCR (RSV, Flu A&B, Covid) Anterior Nasal Swab     Status: None   Collection Time: 11/16/23  9:03 PM   Specimen: Anterior Nasal Swab  Result Value Ref Range Status   SARS Coronavirus 2 by RT PCR NEGATIVE NEGATIVE Final    Comment: (NOTE) SARS-CoV-2 target nucleic acids are NOT DETECTED.  The SARS-CoV-2 RNA is generally detectable in upper respiratory specimens during the acute phase of infection. The lowest concentration of SARS-CoV-2 viral copies this assay can detect is 138 copies/mL. A negative result does not preclude SARS-Cov-2 infection and should not be used as the sole basis for treatment or other patient management decisions. A negative result may occur with  improper specimen collection/handling, submission of specimen other than nasopharyngeal swab, presence of viral  mutation(s) within the areas targeted by this assay, and inadequate number of viral copies(<138 copies/mL). A negative result must be combined with clinical observations, patient history, and epidemiological information. The expected result is Negative.  Fact Sheet for Patients:  BloggerCourse.com  Fact Sheet for Healthcare Providers:  SeriousBroker.it  This test is no t yet approved or cleared by the [place]  FDA and  has been authorized for detection and/or diagnosis of SARS-CoV-2 by FDA under an Emergency Use Authorization (EUA). This EUA will remain  in effect (meaning this test can be used) for the duration of the COVID-19 declaration under Section 564(b)(1) of the Act, 21 U.S.C.section 360bbb-3(b)(1), unless the authorization is terminated  or revoked sooner.       Influenza A by PCR NEGATIVE NEGATIVE Final   Influenza B by PCR NEGATIVE NEGATIVE Final    Comment: (NOTE) The Xpert Xpress SARS-CoV-2/FLU/RSV plus assay is intended as an aid in the diagnosis of influenza from Nasopharyngeal swab specimens and should not be used as a sole basis for treatment. Nasal washings and aspirates are unacceptable for Xpert Xpress SARS-CoV-2/FLU/RSV testing.  Fact Sheet for Patients: BloggerCourse.com  Fact Sheet for Healthcare Providers: SeriousBroker.it  This test is not yet approved or cleared by the [place]  FDA and has been authorized for detection and/or diagnosis of SARS-CoV-2 by FDA under an Emergency Use Authorization (EUA). This EUA will remain in effect (meaning this test can be used) for the duration of the COVID-19 declaration under Section 564(b)(1) of the Act, 21 U.S.C. section 360bbb-3(b)(1), unless the authorization is terminated or revoked.     Resp Syncytial Virus by PCR NEGATIVE NEGATIVE Final    Comment: (NOTE) Fact Sheet for  Patients: BloggerCourse.com  Fact Sheet for Healthcare Providers: SeriousBroker.it  This test is not yet approved or cleared by the [place]  FDA and has been authorized for  detection and/or diagnosis of SARS-CoV-2 by FDA under an Emergency Use Authorization (EUA). This EUA will remain in effect (meaning this test can be used) for the duration of the COVID-19 declaration under Section 564(b)(1) of the Act, 21 U.S.C. section 360bbb-3(b)(1), unless the authorization is terminated or revoked.  Performed at [facility], [address] [place], [place] [address]   Respiratory (~20 pathogens) panel by PCR     Status: None   Collection Time: 11/17/23  9:43 AM   Specimen: Nasopharyngeal Swab; Respiratory  Result Value Ref Range Status   Adenovirus NOT DETECTED NOT DETECTED Final   Coronavirus 229E NOT DETECTED NOT DETECTED Final    Comment: (NOTE) The Coronavirus on the Respiratory Panel, DOES NOT test for the novel  Coronavirus (2019 nCoV)    Coronavirus HKU1 NOT DETECTED NOT DETECTED Final   Coronavirus NL63 NOT DETECTED NOT DETECTED Final   Coronavirus OC43 NOT DETECTED NOT DETECTED Final   Metapneumovirus NOT DETECTED NOT DETECTED Final   Rhinovirus / Enterovirus NOT DETECTED NOT DETECTED Final   Influenza A NOT DETECTED NOT DETECTED Final   Influenza B NOT DETECTED NOT DETECTED Final   Parainfluenza Virus 1 NOT DETECTED NOT DETECTED Final   Parainfluenza Virus 2 NOT DETECTED NOT DETECTED Final   Parainfluenza Virus 3 NOT DETECTED NOT DETECTED Final   Parainfluenza Virus 4 NOT DETECTED NOT DETECTED Final   Respiratory Syncytial Virus NOT DETECTED NOT DETECTED Final   Bordetella pertussis NOT DETECTED NOT DETECTED Final   Bordetella Parapertussis NOT DETECTED NOT DETECTED Final   Chlamydophila pneumoniae NOT DETECTED NOT DETECTED Final   Mycoplasma pneumoniae NOT DETECTED NOT DETECTED Final    Comment: Performed at  [facility], [address] [place], [place] [address]    Labs: CBC: Recent Labs  Lab 03/02/24 0456  WBC 8.2  HGB 14.3  HCT 40.8  MCV 100.0  PLT 146*   Basic Metabolic Panel: Recent Labs  Lab 03/02/24 0456  NA 136  K 4.1  CL 103  CO2 27  GLUCOSE 86  BUN 21  CREATININE 0.64  CALCIUM  9.0   Liver Function Tests: No results for input(s): "AST", "ALT", "ALKPHOS", "BILITOT", "PROT", "ALBUMIN" in the last 168 hours. CBG: No results for input(s): "GLUCAP" in the last 168 hours.  Discharge time spent: greater than 30 minutes.  Signed: Donaciano Frizzle, MD [facility] 03/08/2024

## 2024-03-08 NOTE — Plan of Care (Signed)
 Dillon [name]

## 2024-05-18 ENCOUNTER — Other Ambulatory Visit: Payer: Self-pay

## 2024-05-18 ENCOUNTER — Encounter: Attending: Specialist

## 2024-05-18 DIAGNOSIS — J439 Emphysema, unspecified: Secondary | ICD-10-CM

## 2024-05-18 NOTE — Progress Notes (Signed)
 Virtual Visit completed. Patient informed on EP and RD appointment and 6 Minute walk test. Patient also informed of patient health questionnaires on My Chart. Patient Verbalizes understanding. Visit diagnosis can be found in [facility] 01/21/2024.

## 2024-05-23 ENCOUNTER — Encounter: Attending: Specialist

## 2024-05-23 VITALS — Ht 74.76 in | Wt 185.2 lb

## 2024-05-23 DIAGNOSIS — J439 Emphysema, unspecified: Secondary | ICD-10-CM | POA: Insufficient documentation

## 2024-05-23 NOTE — Progress Notes (Signed)
 Pulmonary Individual Treatment Plan  Patient Details  Name: Dillon [name] MRN: [id number] Date of Birth: [birth year] Referring Provider:   Flowsheet Row Pulmonary Rehab from 05/23/2024 in [facility] Cardiac and Pulmonary Rehab  Referring Provider Theotis Chancellor, MD    Initial Encounter Date:  Flowsheet Row Pulmonary Rehab from 05/23/2024 in [facility] Cardiac and Pulmonary Rehab  Date 05/23/24    Visit Diagnosis: Pulmonary emphysema, unspecified emphysema type (HCC)  Patient's Home Medications on Admission:  Current Outpatient Medications:    acetaminophen  (TYLENOL ) 500 MG tablet, Take 1,000 mg by mouth every 8 (eight) hours as needed for moderate pain., Disp: , Rfl:    albuterol  (PROVENTIL ) (2.5 MG/3ML) 0.083% nebulizer solution, SMARTSIG:3 Milliliter(s) Via Nebulizer Every 6 Hours PRN, Disp: , Rfl:    ALPHA LIPOIC ACID PO, Take 500-1,000 mg by mouth daily., Disp: , Rfl:    atorvastatin  (LIPITOR ) 40 MG tablet, Take 1 tablet (40 mg total) by mouth daily., Disp: 90 tablet, Rfl: 3   carvedilol  (COREG ) 6.25 MG tablet, Take 1 tablet (6.25 mg total) by mouth 2 (two) times daily with a meal., Disp: 180 tablet, Rfl: 3   clopidogrel  (PLAVIX ) 75 MG tablet, Take by mouth., Disp: , Rfl:    enoxaparin  (LOVENOX ) 40 MG/0.4ML injection, Inject 0.4 mLs (40 mg total) into the skin daily. (Patient not taking: Reported on 05/18/2024), Disp: , Rfl:    fluconazole (DIFLUCAN) 200 MG tablet, Take 200 mg by mouth once a week., Disp: , Rfl:    Fluticasone -Umeclidin-Vilant (TRELEGY ELLIPTA) 100-62.5-25 MCG/ACT AEPB, Inhale 1 puff into the lungs daily., Disp: 60 each, Rfl: 1   furosemide  (LASIX ) 20 MG tablet, Take 20 mg by mouth daily. (Patient not taking: Reported on 05/18/2024), Disp: , Rfl:    nitroGLYCERIN  (NITROSTAT ) 0.4 MG SL tablet, Place 1 tablet (0.4 mg total) under the tongue every 5 (five) minutes x 3 doses as needed for chest pain., Disp: 25 tablet, Rfl: 3   OXYGEN , Inhale 4 L into the lungs continuous., Disp: , Rfl:     pantoprazole  (PROTONIX ) 40 MG tablet, Take 40 mg by mouth daily., Disp: , Rfl:    pantoprazole  (PROTONIX ) 40 MG tablet, Take 1 tablet by mouth daily., Disp: , Rfl:    Pirfenidone  (ESBRIET ) 267 MG CAPS, Take 3 capsules by mouth 3 (three) times daily., Disp: , Rfl:    Pirfenidone  801 MG TABS, Take by mouth., Disp: , Rfl:    potassium chloride (KLOR-CON M) 10 MEQ tablet, Take 10 mEq by mouth daily. (Patient not taking: Reported on 05/18/2024), Disp: , Rfl:    predniSONE  (DELTASONE ) 10 MG tablet, Take 10 mg by mouth daily., Disp: , Rfl:    predniSONE  (DELTASONE ) 20 MG tablet, Take 20 mg by mouth daily., Disp: , Rfl:    roflumilast  (DALIRESP ) 500 MCG TABS tablet, Take 1 tablet by mouth daily. Every other day, Disp: , Rfl:    Treprostinil  (TYVASO  STARTER KIT) 0.6 MG/ML SOLN, Inhale 18 mcg into the lungs 4 (four) times daily. (Patient not taking: Reported on 05/18/2024), Disp: , Rfl:   Past Medical History: Past Medical History:  Diagnosis Date   Chronic cough    COPD (chronic obstructive pulmonary disease) (HCC)    Coronary artery disease    Edentulous    Elevated cholesterol    GERD (gastroesophageal reflux disease)    Myocardial infarction (HCC)    Pulmonary fibrosis (HCC)    Restless leg syndrome    Supplemental oxygen  dependent     Tobacco Use: Social History  Tobacco Use  Smoking Status Former   Current packs/day: 0.00   Average packs/day: 2.0 packs/day for 37.0 years (74.0 ttl pk-yrs)   Types: Cigarettes   Start date: 60   Quit date: 2010   Years since quitting: 15.6  Smokeless Tobacco Never    Labs: Review Flowsheet  More data exists      Latest Ref Rng & Units 09/30/2021 10/01/2021 07/09/2023 11/16/2023 02/24/2024  Labs for ITP Cardiac and Pulmonary Rehab  Cholestrol 100 - 199 mg/dL - 881  857  - -  LDL (calc) 0 - 99 mg/dL - 64  70  - -  HDL-C >60 mg/dL - 45  52  - -  Trlycerides 0 - 149 mg/dL - 45  891  - -  Hemoglobin A1c 4.8 - 5.6 % 5.7  - - - -  Bicarbonate  20.0 - 28.0 mmol/L - - - 29.2  24.4   Acid-base deficit 0.0 - 2.0 mmol/L - - - - 4.5   O2 Saturation % - - - 65  91      Pulmonary Assessment Scores:  Pulmonary Assessment Scores     Row Name 05/23/24 1320         ADL UCSD   ADL Phase Entry     SOB Score total 108     Rest 1     Walk 5     Stairs 5     Bath 5     Dress 4     Shop 5       CAT Score   CAT Score 31       mMRC Score   mMRC Score 3        UCSD: Self-administered rating of dyspnea associated with activities of daily living (ADLs) 6-point scale (0 = not at all to 5 = maximal or unable to do because of breathlessness)  Scoring Scores range from 0 to 120.  Minimally important difference is 5 units  CAT: CAT can identify the health impairment of COPD patients and is better correlated with disease progression.  CAT has a scoring range of zero to 40. The CAT score is classified into four groups of low (less than 10), medium (10 - 20), high (21-30) and very high (31-40) based on the impact level of disease on health status. A CAT score over 10 suggests significant symptoms.  A worsening CAT score could be explained by an exacerbation, poor medication adherence, poor inhaler technique, or progression of COPD or comorbid conditions.  CAT MCID is 2 points  mMRC: mMRC (Modified Medical Research Council) Dyspnea Scale is used to assess the degree of baseline functional disability in patients of respiratory disease due to dyspnea. No minimal important difference is established. A decrease in score of 1 point or greater is considered a positive change.   Pulmonary Function Assessment:  Pulmonary Function Assessment - 05/18/24 1434       Breath   Shortness of Breath Yes;Limiting activity;Fear of Shortness of Breath;Panic with Shortness of Breath          Exercise Target Goals: Exercise Program Goal: Individual exercise prescription set using results from initial 6 min walk test and THRR while considering   patient's activity barriers and safety.   Exercise Prescription Goal: Initial exercise prescription builds to 30-45 minutes a day of aerobic activity, 2-3 days per week.  Home exercise guidelines will be given to patient during program as part of exercise prescription that the participant will acknowledge.  Education:  Aerobic Exercise: - Group verbal and visual presentation on the components of exercise prescription. Introduces F.I.T.T principle from ACSM for exercise prescriptions.  Reviews F.I.T.T. principles of aerobic exercise including progression. Written material given at graduation.   Education: Resistance Exercise: - Group verbal and visual presentation on the components of exercise prescription. Introduces F.I.T.T principle from ACSM for exercise prescriptions  Reviews F.I.T.T. principles of resistance exercise including progression. Written material given at graduation.    Education: Exercise & Equipment Safety: - Individual verbal instruction and demonstration of equipment use and safety with use of the equipment. Flowsheet Row Pulmonary Rehab from 05/23/2024 in [facility] Cardiac and Pulmonary Rehab  Date 05/23/24  Educator MB  Instruction Review Code 1- Verbalizes Understanding    Education: Exercise Physiology & General Exercise Guidelines: - Group verbal and written instruction with models to review the exercise physiology of the cardiovascular system and associated critical values. Provides general exercise guidelines with specific guidelines to those with heart or lung disease.    Education: Flexibility, Balance, Mind/Body Relaxation: - Group verbal and visual presentation with interactive activity on the components of exercise prescription. Introduces F.I.T.T principle from ACSM for exercise prescriptions. Reviews F.I.T.T. principles of flexibility and balance exercise training including progression. Also discusses the mind body connection.  Reviews various relaxation techniques  to help reduce and manage stress (i.e. Deep breathing, progressive muscle relaxation, and visualization). Balance handout provided to take home. Written material given at graduation.   Activity Barriers & Risk Stratification:  Activity Barriers & Cardiac Risk Stratification - 05/23/24 1315       Activity Barriers & Cardiac Risk Stratification   Activity Barriers Shortness of Breath;Other (comment)    Comments neuropathy          6 Minute Walk:  6 Minute Walk     Row Name 05/23/24 1312         6 Minute Walk   Phase Initial     Distance 385 feet     Walk Time 3.13 minutes     # of Rest Breaks 2     MPH 1.4     METS 1.73     RPE 17     Perceived Dyspnea  4     VO2 Peak 6.05     Symptoms Yes (comment)     Comments SOB     Resting HR 67 bpm     Resting BP 120/60     Resting Oxygen  Saturation  90 %     Exercise Oxygen  Saturation  during 6 min walk 72 %     Max Ex. HR 90 bpm     Max Ex. BP 122/60     2 Minute Post BP 128/64       Interval HR   1 Minute HR 68     2 Minute HR 75     3 Minute HR 90     4 Minute HR 78     5 Minute HR 75     6 Minute HR 83     2 Minute Post HR 76     Interval Heart Rate? Yes       Interval Oxygen    Interval Oxygen ? Yes     Baseline Oxygen  Saturation % 90 %     1 Minute Oxygen  Saturation % 89 %     1 Minute Liters of Oxygen  10 L     2 Minute Oxygen  Saturation % 72 %     2 Minute Liters of Oxygen   15 L     3 Minute Oxygen  Saturation % 78 %     3 Minute Liters of Oxygen  15 L     4 Minute Oxygen  Saturation % 80 %     4 Minute Liters of Oxygen  15 L     5 Minute Oxygen  Saturation % 84 %     5 Minute Liters of Oxygen  15 L     6 Minute Oxygen  Saturation % 74 %     6 Minute Liters of Oxygen  15 L     2 Minute Post Oxygen  Saturation % 86 %     2 Minute Post Liters of Oxygen  10 L       Oxygen  Initial Assessment:  Oxygen  Initial Assessment - 05/18/24 1432       Home Oxygen    Home Oxygen  Device Home Concentrator;Portable  Concentrator;E-Tanks    Sleep Oxygen  Prescription Continuous    Liters per minute 6    Home Exercise Oxygen  Prescription Continuous    Liters per minute 6    Home Resting Oxygen  Prescription Continuous    Liters per minute 6    Compliance with Home Oxygen  Use Yes      Initial 6 min Walk   Oxygen  Used Continuous    Liters per minute 6      Program Oxygen  Prescription   Program Oxygen  Prescription Continuous    Liters per minute 6      Intervention   Short Term Goals To learn and exhibit compliance with exercise, home and travel O2 prescription;To learn and understand importance of monitoring SPO2 with pulse oximeter and demonstrate accurate use of the pulse oximeter.;To learn and demonstrate proper pursed lip breathing techniques or other breathing techniques. ;To learn and understand importance of maintaining oxygen  saturations>88%;To learn and demonstrate proper use of respiratory medications    Long  Term Goals Exhibits compliance with exercise, home  and travel O2 prescription;Maintenance of O2 saturations>88%;Compliance with respiratory medication;Demonstrates proper use of MDI's;Exhibits proper breathing techniques, such as pursed lip breathing or other method taught during program session;Verbalizes importance of monitoring SPO2 with pulse oximeter and return demonstration          Oxygen  Re-Evaluation:   Oxygen  Discharge (Final Oxygen  Re-Evaluation):   Initial Exercise Prescription:  Initial Exercise Prescription - 05/23/24 1300       Date of Initial Exercise RX and Referring Provider   Date 05/23/24    Referring Provider Theotis Chancellor, MD      Oxygen    Oxygen  Continuous    Liters 6-15    Maintain Oxygen  Saturation 88% or higher      Treadmill   MPH 1   Try   Grade 0    Minutes 15    METs 1.73      Recumbant Bike   Level 1    RPM 50    Watts 15    Minutes 15    METs 1.73      NuStep   Level 1    SPM 80    Minutes 15    METs 1.73       Biostep-RELP   Level 1    SPM 50    Minutes 15    METs 1.73      Track   Laps 9    Minutes 15    METs 1.49      Prescription Details   Frequency (times per week) 3    Duration Progress to 30 minutes of continuous aerobic without signs/symptoms  of physical distress      Intensity   THRR 40-80% of Max Heartrate 101-135    Ratings of Perceived Exertion 11-13    Perceived Dyspnea 0-4      Progression   Progression Continue to progress workloads to maintain intensity without signs/symptoms of physical distress.      Resistance Training   Training Prescription Yes    Weight 5 lb    Reps 10-15          Perform Capillary Blood Glucose checks as needed.  Exercise Prescription Changes:   Exercise Prescription Changes     Row Name 05/23/24 1300             Response to Exercise   Blood Pressure (Admit) 120/60       Blood Pressure (Exercise) 122/60       Blood Pressure (Exit) 128/64       Heart Rate (Admit) 67 bpm       Heart Rate (Exercise) 90 bpm       Heart Rate (Exit) 67 bpm       Oxygen  Saturation (Admit) 90 %       Oxygen  Saturation (Exercise) 72 %       Oxygen  Saturation (Exit) 89 %       Rating of Perceived Exertion (Exercise) 17       Perceived Dyspnea (Exercise) 4       Symptoms SOB       Comments results         Progression   Average METs 1.73          Exercise Comments:   Exercise Goals and Review:   Exercise Goals     Row Name 05/23/24 1319             Exercise Goals   Increase Physical Activity Yes       Intervention Provide advice, education, support and counseling about physical activity/exercise needs.;Develop an individualized exercise prescription for aerobic and resistive training based on initial evaluation findings, risk stratification, comorbidities and participant's personal goals.       Expected Outcomes Short Term: Attend rehab on a regular basis to increase amount of physical activity.;Long Term: Add in home exercise  to make exercise part of routine and to increase amount of physical activity.;Long Term: Exercising regularly at least 3-5 days a week.       Increase Strength and Stamina Yes       Intervention Provide advice, education, support and counseling about physical activity/exercise needs.;Develop an individualized exercise prescription for aerobic and resistive training based on initial evaluation findings, risk stratification, comorbidities and participant's personal goals.       Expected Outcomes Short Term: Increase workloads from initial exercise prescription for resistance, speed, and METs.;Short Term: Perform resistance training exercises routinely during rehab and add in resistance training at home;Long Term: Improve cardiorespiratory fitness, muscular endurance and strength as measured by increased METs and functional capacity ( )       Able to understand and use rate of perceived exertion (RPE) scale Yes       Intervention Provide education and explanation on how to use RPE scale       Expected Outcomes Short Term: Able to use RPE daily in rehab to express subjective intensity level;Long Term:  Able to use RPE to guide intensity level when exercising independently       Able to understand and use Dyspnea scale Yes       Intervention Provide education  and explanation on how to use Dyspnea scale       Expected Outcomes Short Term: Able to use Dyspnea scale daily in rehab to express subjective sense of shortness of breath during exertion;Long Term: Able to use Dyspnea scale to guide intensity level when exercising independently       Knowledge and understanding of Target Heart Rate Range (THRR) Yes       Intervention Provide education and explanation of THRR including how the numbers were predicted and where they are located for reference       Expected Outcomes Short Term: Able to state/look up THRR;Short Term: Able to use daily as guideline for intensity in rehab;Long Term: Able to use THRR to  govern intensity when exercising independently       Able to check pulse independently Yes       Intervention Provide education and demonstration on how to check pulse in carotid and radial arteries.;Review the importance of being able to check your own pulse for safety during independent exercise       Expected Outcomes Short Term: Able to explain why pulse checking is important during independent exercise;Long Term: Able to check pulse independently and accurately       Understanding of Exercise Prescription Yes       Intervention Provide education, explanation, and written materials on patient's individual exercise prescription       Expected Outcomes Short Term: Able to explain program exercise prescription;Long Term: Able to explain home exercise prescription to exercise independently          Exercise Goals Re-Evaluation :   Discharge Exercise Prescription (Final Exercise Prescription Changes):  Exercise Prescription Changes - 05/23/24 1300       Response to Exercise   Blood Pressure (Admit) 120/60    Blood Pressure (Exercise) 122/60    Blood Pressure (Exit) 128/64    Heart Rate (Admit) 67 bpm    Heart Rate (Exercise) 90 bpm    Heart Rate (Exit) 67 bpm    Oxygen  Saturation (Admit) 90 %    Oxygen  Saturation (Exercise) 72 %    Oxygen  Saturation (Exit) 89 %    Rating of Perceived Exertion (Exercise) 17    Perceived Dyspnea (Exercise) 4    Symptoms SOB    Comments results      Progression   Average METs 1.73          Nutrition:  Target Goals: Understanding of nutrition guidelines, daily intake of sodium 1500mg , cholesterol 200mg , calories 30% from fat and 7% or less from saturated fats, daily to have 5 or more servings of fruits and vegetables.  Education: All About Nutrition: -Group instruction provided by verbal, written material, interactive activities, discussions, models, and posters to present general guidelines for heart healthy nutrition including fat,  fiber, MyPlate, the role of sodium in heart healthy nutrition, utilization of the nutrition label, and utilization of this knowledge for meal planning. Follow up email sent as well. Written material given at graduation.   Biometrics:  Pre Biometrics - 05/23/24 1319       Pre Biometrics   Height 6' 2.76 (1.899 m)    Weight 185 lb 3.2 oz (84 kg)    Waist Circumference 39.8 inches    Hip Circumference 39 inches    Waist to Hip Ratio 1.02 %    BMI (Calculated) 23.29    Single Leg Stand 3.7 seconds           Nutrition Therapy Plan and Nutrition  Goals:  Nutrition Therapy & Goals - 05/23/24 1322       Nutrition Therapy   RD appointment deferred Yes      Personal Nutrition Goals   Nutrition Goal RD appointment deferred at this time      Intervention Plan   Intervention Prescribe, educate and counsel regarding individualized specific dietary modifications aiming towards targeted core components such as weight, hypertension, lipid management, diabetes, heart failure and other comorbidities.    Expected Outcomes Short Term Goal: Understand basic principles of dietary content, such as calories, fat, sodium, cholesterol and nutrients.          Nutrition Assessments:  MEDIFICTS Score Key: >=70 Need to make dietary changes  40-70 Heart Healthy Diet <= 40 Therapeutic Level Cholesterol Diet  Flowsheet Row Pulmonary Rehab from 05/23/2024 in [facility] Cardiac and Pulmonary Rehab  Picture Your Plate Total Score on Admission 46   Picture Your Plate Scores: <59 Unhealthy dietary pattern with much room for improvement. 41-50 Dietary pattern unlikely to meet recommendations for good health and room for improvement. 51-60 More healthful dietary pattern, with some room for improvement.  >60 Healthy dietary pattern, although there may be some specific behaviors that could be improved.   Nutrition Goals Re-Evaluation:   Nutrition Goals Discharge (Final Nutrition Goals  Re-Evaluation):   Psychosocial: Target Goals: Acknowledge presence or absence of significant depression and/or stress, maximize coping skills, provide positive support system. Participant is able to verbalize types and ability to use techniques and skills needed for reducing stress and depression.   Education: Stress, Anxiety, and Depression - Group verbal and visual presentation to define topics covered.  Reviews how body is impacted by stress, anxiety, and depression.  Also discusses healthy ways to reduce stress and to treat/manage anxiety and depression.  Written material given at graduation.   Education: Sleep Hygiene -Provides group verbal and written instruction about how sleep can affect your health.  Define sleep hygiene, discuss sleep cycles and impact of sleep habits. Review good sleep hygiene tips.    Initial Review & Psychosocial Screening:  Initial Psych Review & Screening - 05/18/24 1437       Initial Review   Current issues with Current Stress Concerns    Source of Stress Concerns Chronic Illness      Family Dynamics   Good Support System? Yes    Comments He can look to his wife, daughters and son. He is wanting to get on the transplant list for his lungs. He takes no medication for his mood.      Barriers   Psychosocial barriers to participate in program The patient should benefit from training in stress management and relaxation.;There are no identifiable barriers or psychosocial needs.      Screening Interventions   Interventions Provide feedback about the scores to participant;To provide support and resources with identified psychosocial needs;Encouraged to exercise    Expected Outcomes Short Term goal: Utilizing psychosocial counselor, staff and physician to assist with identification of specific Stressors or current issues interfering with healing process. Setting desired goal for each stressor or current issue identified.;Long Term Goal: Stressors or current  issues are controlled or eliminated.;Short Term goal: Identification and review with participant of any Quality of Life or Depression concerns found by scoring the questionnaire.;Long Term goal: The participant improves quality of Life and PHQ9 Scores as seen by post scores and/or verbalization of changes          Quality of Life Scores:  Scores of 19 and below  usually indicate a poorer quality of life in these areas.  A difference of  2-3 points is a clinically meaningful difference.  A difference of 2-3 points in the total score of the Quality of Life Index has been associated with significant improvement in overall quality of life, self-image, physical symptoms, and general health in studies assessing change in quality of life.  PHQ-9: Review Flowsheet       05/23/2024 12/23/2021 08/12/2021 09/07/2020  Depression screen PHQ 2/9  Decreased Interest 0 0 0 0  Down, Depressed, Hopeless 0 0 0 0  PHQ - 2 Score 0 0 0 0  Altered sleeping 1 - - -  Tired, decreased energy 1 - - -  Change in appetite 0 - - -  Feeling bad or failure about yourself  0 - - -  Trouble concentrating 1 - - -  Moving slowly or fidgety/restless 1 - - -  Suicidal thoughts 0 - - -  PHQ-9 Score 4 - - -  Difficult doing work/chores Somewhat difficult - - -   Interpretation of Total Score  Total Score Depression Severity:  1-4 = Minimal depression, 5-9 = Mild depression, 10-14 = Moderate depression, 15-19 = Moderately severe depression, 20-27 = Severe depression   Psychosocial Evaluation and Intervention:  Psychosocial Evaluation - 05/18/24 1438       Psychosocial Evaluation & Interventions   Interventions Stress management education;Relaxation education;Encouraged to exercise with the program and follow exercise prescription    Comments He can look to his wife, daughters and son. He is wanting to get on the transplant list for his lungs. He takes no medication for his mood.    Expected Outcomes Short: Start [facility]  to help with mood. Long: Maintain a healthy mental state.    Continue Psychosocial Services  Follow up required by staff          Psychosocial Re-Evaluation:   Psychosocial Discharge (Final Psychosocial Re-Evaluation):   Education: Education Goals: Education classes will be provided on a weekly basis, covering required topics. Participant will state understanding/return demonstration of topics presented.  Learning Barriers/Preferences:  Learning Barriers/Preferences - 05/18/24 1435       Learning Barriers/Preferences   Learning Barriers None    Learning Preferences None          General Pulmonary Education Topics:  Infection Prevention: - Provides verbal and written material to individual with discussion of infection control including proper hand washing and proper equipment cleaning during exercise session. Flowsheet Row Pulmonary Rehab from 05/23/2024 in [facility] Cardiac and Pulmonary Rehab  Date 05/23/24  Educator MB  Instruction Review Code 1- Verbalizes Understanding    Falls Prevention: - Provides verbal and written material to individual with discussion of falls prevention and safety. Flowsheet Row Pulmonary Rehab from 05/23/2024 in [facility] Cardiac and Pulmonary Rehab  Date 05/23/24  Educator MB  Instruction Review Code 1- Verbalizes Understanding    Chronic Lung Disease Review: - Group verbal instruction with posters, models, PowerPoint presentations and videos,  to review new updates, new respiratory medications, new advancements in procedures and treatments. Providing information on websites and 800 numbers for continued self-education. Includes information about supplement oxygen , available portable oxygen  systems, continuous and intermittent flow rates, oxygen  safety, concentrators, and Medicare reimbursement for oxygen . Explanation of Pulmonary Drugs, including class, frequency, complications, importance of spacers, rinsing mouth after steroid MDI's, and proper  cleaning methods for nebulizers. Review of basic lung anatomy and physiology related to function, structure, and complications of lung disease. Review of  risk factors. Discussion about methods for diagnosing sleep apnea and types of masks and machines for OSA. Includes a review of the use of types of environmental controls: home humidity, furnaces, filters, dust mite/pet prevention, HEPA vacuums. Discussion about weather changes, air quality and the benefits of nasal washing. Instruction on Warning signs, infection symptoms, calling MD promptly, preventive modes, and value of vaccinations. Review of effective airway clearance, coughing and/or vibration techniques. Emphasizing that all should Create an Action Plan. Written material given at graduation. Flowsheet Row Pulmonary Rehab from 05/23/2024 in [facility] Cardiac and Pulmonary Rehab  Education need identified 05/23/24    AED/CPR: - Group verbal and written instruction with the use of models to demonstrate the basic use of the AED with the basic ABC's of resuscitation.    Anatomy and Cardiac Procedures: - Group verbal and visual presentation and models provide information about basic cardiac anatomy and function. Reviews the testing methods done to diagnose heart disease and the outcomes of the test results. Describes the treatment choices: Medical Management, Angioplasty, or Coronary Bypass Surgery for treating various heart conditions including Myocardial Infarction, Angina, Valve Disease, and Cardiac Arrhythmias.  Written material given at graduation.   Medication Safety: - Group verbal and visual instruction to review commonly prescribed medications for heart and lung disease. Reviews the medication, class of the drug, and side effects. Includes the steps to properly store meds and maintain the prescription regimen.  Written material given at graduation.   Other: -Provides group and verbal instruction on various topics (see  comments)   Knowledge Questionnaire Score:  Knowledge Questionnaire Score - 05/23/24 1326       Knowledge Questionnaire Score   Pre Score 13/18           Core Components/Risk Factors/Patient Goals at Admission:  Personal Goals and Risk Factors at Admission - 05/23/24 1326       Core Components/Risk Factors/Patient Goals on Admission    Weight Management Yes;Weight Maintenance    Intervention Weight Management: Develop a combined nutrition and exercise program designed to reach desired caloric intake, while maintaining appropriate intake of nutrient and fiber, sodium and fats, and appropriate energy expenditure required for the weight goal.;Weight Management: Provide education and appropriate resources to help participant work on and attain dietary goals.;Weight Management/Obesity: Establish reasonable short term and long term weight goals.    Admit Weight 185 lb 3.2 oz (84 kg)    Goal Weight: Short Term 185 lb 3.2 oz (84 kg)    Goal Weight: Long Term 185 lb 3.2 oz (84 kg)    Expected Outcomes Weight Maintenance: Understanding of the daily nutrition guidelines, which includes 25-35% calories from fat, 7% or less cal from saturated fats, less than 200mg  cholesterol, less than 1.5gm of sodium, & 5 or more servings of fruits and vegetables daily;Understanding recommendations for meals to include 15-35% energy as protein, 25-35% energy from fat, 35-60% energy from carbohydrates, less than 200mg  of dietary cholesterol, 20-35 gm of total fiber daily;Understanding of distribution of calorie intake throughout the day with the consumption of 4-5 meals/snacks;Short Term: Continue to assess and modify interventions until short term weight is achieved;Long Term: Adherence to nutrition and physical activity/exercise program aimed toward attainment of established weight goal    Improve shortness of breath with ADL's Yes    Intervention Provide education, individualized exercise plan and daily activity  instruction to help decrease symptoms of SOB with activities of daily living.    Expected Outcomes Short Term: Improve cardiorespiratory fitness to achieve  a reduction of symptoms when performing ADLs;Long Term: Be able to perform more ADLs without symptoms or delay the onset of symptoms    Hypertension Yes    Intervention Provide education on lifestyle modifcations including regular physical activity/exercise, weight management, moderate sodium restriction and increased consumption of fresh fruit, vegetables, and low fat dairy, alcohol  moderation, and smoking cessation.;Monitor prescription use compliance.    Expected Outcomes Short Term: Continued assessment and intervention until BP is < 140/13mm HG in hypertensive participants. < 130/42mm HG in hypertensive participants with diabetes, heart failure or chronic kidney disease.;Long Term: Maintenance of blood pressure at goal levels.    Lipids Yes    Intervention Provide education and support for participant on nutrition & aerobic/resistive exercise along with prescribed medications to achieve LDL 70mg , HDL >40mg .    Expected Outcomes Short Term: Participant states understanding of desired cholesterol values and is compliant with medications prescribed. Participant is following exercise prescription and nutrition guidelines.;Long Term: Cholesterol controlled with medications as prescribed, with individualized exercise RX and with personalized nutrition plan. Value goals: LDL < 70mg , HDL > 40 mg.          Education:Diabetes - Individual verbal and written instruction to review signs/symptoms of diabetes, desired ranges of glucose level fasting, after meals and with exercise. Acknowledge that pre and post exercise glucose checks will be done for 3 sessions at entry of program.   Know Your Numbers and Heart Failure: - Group verbal and visual instruction to discuss disease risk factors for cardiac and pulmonary disease and treatment options.  Reviews  associated critical values for Overweight/Obesity, Hypertension, Cholesterol, and Diabetes.  Discusses basics of heart failure: signs/symptoms and treatments.  Introduces Heart Failure Zone chart for action plan for heart failure.  Written material given at graduation.   Core Components/Risk Factors/Patient Goals Review:    Core Components/Risk Factors/Patient Goals at Discharge (Final Review):    ITP Comments:  ITP Comments     Row Name 05/18/24 1441 05/23/24 1208         ITP Comments Virtual Visit completed. Patient informed on EP and RD appointment and 6 Minute walk test. Patient also informed of patient health questionnaires on My Chart. Patient Verbalizes understanding. Visit diagnosis can be found in [facility] 01/21/2024. Completed and gym orientation for respiratory care services. Initial ITP created and sent for review to Dr. Faud Aleskerov, Medical Director.         Comments: Initial ITP

## 2024-05-23 NOTE — Patient Instructions (Signed)
 Patient Instructions  Patient Details  Name: Dillon [name] MRN: [id number] Date of Birth: [birth year] Referring Provider:  Theotis Lavelle BRAVO, MD  Below are your personal goals for exercise, nutrition, and risk factors. Our goal is to help you stay on track towards obtaining and maintaining these goals. We will be discussing your progress on these goals with you throughout the program.  Initial Exercise Prescription:  Initial Exercise Prescription - 05/23/24 1300       Date of Initial Exercise RX and Referring Provider   Date 05/23/24    Referring Provider Theotis Lavelle, MD      Oxygen    Oxygen  Continuous    Liters 6-15    Maintain Oxygen  Saturation 88% or higher      Treadmill   MPH 1   Try   Grade 0    Minutes 15    METs 1.73      Recumbant Bike   Level 1    RPM 50    Watts 15    Minutes 15    METs 1.73      NuStep   Level 1    SPM 80    Minutes 15    METs 1.73      Biostep-RELP   Level 1    SPM 50    Minutes 15    METs 1.73      Track   Laps 9    Minutes 15    METs 1.49      Prescription Details   Frequency (times per week) 3    Duration Progress to 30 minutes of continuous aerobic without signs/symptoms of physical distress      Intensity   THRR 40-80% of Max Heartrate 101-135    Ratings of Perceived Exertion 11-13    Perceived Dyspnea 0-4      Progression   Progression Continue to progress workloads to maintain intensity without signs/symptoms of physical distress.      Resistance Training   Training Prescription Yes    Weight 5 lb    Reps 10-15          Exercise Goals: Frequency: Be able to perform aerobic exercise two to three times per week in program working toward 2-5 days per week of home exercise.  Intensity: Work with a perceived exertion of 11 (fairly light) - 15 (hard) while following your exercise prescription.  We will make changes to your prescription with you as you progress through the program.   Duration: Be able to do  30 to 45 minutes of continuous aerobic exercise in addition to a 5 minute warm-up and a 5 minute cool-down routine.   Nutrition Goals: Your personal nutrition goals will be established when you do your nutrition analysis with the dietician.  The following are general nutrition guidelines to follow: Cholesterol < 200mg /day Sodium < 1500mg /day Fiber: Men over 50 yrs - 30 grams per day  Personal Goals:  Personal Goals and Risk Factors at Admission - 05/23/24 1326       Core Components/Risk Factors/Patient Goals on Admission    Weight Management Yes;Weight Maintenance    Intervention Weight Management: Develop a combined nutrition and exercise program designed to reach desired caloric intake, while maintaining appropriate intake of nutrient and fiber, sodium and fats, and appropriate energy expenditure required for the weight goal.;Weight Management: Provide education and appropriate resources to help participant work on and attain dietary goals.;Weight Management/Obesity: Establish reasonable short term and long term weight goals.    Admit  Weight 185 lb 3.2 oz (84 kg)    Goal Weight: Short Term 185 lb 3.2 oz (84 kg)    Goal Weight: Long Term 185 lb 3.2 oz (84 kg)    Expected Outcomes Weight Maintenance: Understanding of the daily nutrition guidelines, which includes 25-35% calories from fat, 7% or less cal from saturated fats, less than 200mg  cholesterol, less than 1.5gm of sodium, & 5 or more servings of fruits and vegetables daily;Understanding recommendations for meals to include 15-35% energy as protein, 25-35% energy from fat, 35-60% energy from carbohydrates, less than 200mg  of dietary cholesterol, 20-35 gm of total fiber daily;Understanding of distribution of calorie intake throughout the day with the consumption of 4-5 meals/snacks;Short Term: Continue to assess and modify interventions until short term weight is achieved;Long Term: Adherence to nutrition and physical activity/exercise  program aimed toward attainment of established weight goal    Improve shortness of breath with ADL's Yes    Intervention Provide education, individualized exercise plan and daily activity instruction to help decrease symptoms of SOB with activities of daily living.    Expected Outcomes Short Term: Improve cardiorespiratory fitness to achieve a reduction of symptoms when performing ADLs;Long Term: Be able to perform more ADLs without symptoms or delay the onset of symptoms    Hypertension Yes    Intervention Provide education on lifestyle modifcations including regular physical activity/exercise, weight management, moderate sodium restriction and increased consumption of fresh fruit, vegetables, and low fat dairy, alcohol  moderation, and smoking cessation.;Monitor prescription use compliance.    Expected Outcomes Short Term: Continued assessment and intervention until BP is < 140/16mm HG in hypertensive participants. < 130/53mm HG in hypertensive participants with diabetes, heart failure or chronic kidney disease.;Long Term: Maintenance of blood pressure at goal levels.    Lipids Yes    Intervention Provide education and support for participant on nutrition & aerobic/resistive exercise along with prescribed medications to achieve LDL 70mg , HDL >40mg .    Expected Outcomes Short Term: Participant states understanding of desired cholesterol values and is compliant with medications prescribed. Participant is following exercise prescription and nutrition guidelines.;Long Term: Cholesterol controlled with medications as prescribed, with individualized exercise RX and with personalized nutrition plan. Value goals: LDL < 70mg , HDL > 40 mg.          Tobacco Use Initial Evaluation: Social History   Tobacco Use  Smoking Status Former   Current packs/day: 0.00   Average packs/day: 2.0 packs/day for 37.0 years (74.0 ttl pk-yrs)   Types: Cigarettes   Start date: 59   Quit date: 2010   Years since  quitting: 15.6  Smokeless Tobacco Never    Exercise Goals and Review:  Exercise Goals     Row Name 05/23/24 1319             Exercise Goals   Increase Physical Activity Yes       Intervention Provide advice, education, support and counseling about physical activity/exercise needs.;Develop an individualized exercise prescription for aerobic and resistive training based on initial evaluation findings, risk stratification, comorbidities and participant's personal goals.       Expected Outcomes Short Term: Attend rehab on a regular basis to increase amount of physical activity.;Long Term: Add in home exercise to make exercise part of routine and to increase amount of physical activity.;Long Term: Exercising regularly at least 3-5 days a week.       Increase Strength and Stamina Yes       Intervention Provide advice, education, support and counseling about  physical activity/exercise needs.;Develop an individualized exercise prescription for aerobic and resistive training based on initial evaluation findings, risk stratification, comorbidities and participant's personal goals.       Expected Outcomes Short Term: Increase workloads from initial exercise prescription for resistance, speed, and METs.;Short Term: Perform resistance training exercises routinely during rehab and add in resistance training at home;Long Term: Improve cardiorespiratory fitness, muscular endurance and strength as measured by increased METs and functional capacity ( )       Able to understand and use rate of perceived exertion (RPE) scale Yes       Intervention Provide education and explanation on how to use RPE scale       Expected Outcomes Short Term: Able to use RPE daily in rehab to express subjective intensity level;Long Term:  Able to use RPE to guide intensity level when exercising independently       Able to understand and use Dyspnea scale Yes       Intervention Provide education and explanation on how to use  Dyspnea scale       Expected Outcomes Short Term: Able to use Dyspnea scale daily in rehab to express subjective sense of shortness of breath during exertion;Long Term: Able to use Dyspnea scale to guide intensity level when exercising independently       Knowledge and understanding of Target Heart Rate Range (THRR) Yes       Intervention Provide education and explanation of THRR including how the numbers were predicted and where they are located for reference       Expected Outcomes Short Term: Able to state/look up THRR;Short Term: Able to use daily as guideline for intensity in rehab;Long Term: Able to use THRR to govern intensity when exercising independently       Able to check pulse independently Yes       Intervention Provide education and demonstration on how to check pulse in carotid and radial arteries.;Review the importance of being able to check your own pulse for safety during independent exercise       Expected Outcomes Short Term: Able to explain why pulse checking is important during independent exercise;Long Term: Able to check pulse independently and accurately       Understanding of Exercise Prescription Yes       Intervention Provide education, explanation, and written materials on patient's individual exercise prescription       Expected Outcomes Short Term: Able to explain program exercise prescription;Long Term: Able to explain home exercise prescription to exercise independently

## 2024-05-25 ENCOUNTER — Encounter

## 2024-05-25 DIAGNOSIS — J439 Emphysema, unspecified: Secondary | ICD-10-CM

## 2024-05-25 NOTE — Progress Notes (Signed)
 Daily Session Note  Patient Details  Name: Dillon [name] MRN: [id number] Date of Birth: 03-06-1956 Referring Provider:   Flowsheet Row Pulmonary Rehab from 05/23/2024 in [facility] Cardiac and Pulmonary Rehab  Referring Provider Theotis Chancellor, MD    Encounter Date: 05/25/2024  Check In:  Session Check In - 05/25/24 1707       Check-In   Supervising physician immediately available to respond to emergencies See telemetry face sheet for immediately available ER MD    Location [facility]-Cardiac & Pulmonary Rehab    Staff Present Burnard Davenport [facility] Dyane BS, ACSM CEP, Exercise Physiologist;Joseph Rolinda RCP,RRT,BSRT    Virtual Visit No    Medication changes reported     No    Fall or balance concerns reported    No    Warm-up and Cool-down Performed on first and last piece of equipment    Resistance Training Performed Yes    VAD Patient? No    PAD/SET Patient? No      Pain Assessment   Currently in Pain? No/denies             Social History   Tobacco Use  Smoking Status Former   Current packs/day: 0.00   Average packs/day: 2.0 packs/day for 37.0 years (74.0 ttl pk-yrs)   Types: Cigarettes   Start date: 76   Quit date: 2010   Years since quitting: 15.6  Smokeless Tobacco Never    Goals Met:  Proper associated with RPD/PD & O2 Sat Independence with exercise equipment Using PLB without cueing & demonstrates good technique Exercise tolerated well No report of concerns or symptoms today Strength training completed today  Goals Unmet:  Not Applicable  Comments: First full day of exercise!  Patient was oriented to gym and equipment including functions, settings, policies, and procedures.  Patient's individual exercise prescription and treatment plan were reviewed.  All starting workloads were established based on the results of the 6 minute walk test done at initial orientation visit.  The plan for exercise progression was also introduced and progression will be  customized based on patient's performance and goals.    Dr. Oneil Pinal is Medical Director for [facility] Cardiac Rehabilitation.  Dr. Fuad Aleskerov is Medical Director for [facility] Pulmonary Rehabilitation.

## 2024-05-26 ENCOUNTER — Encounter

## 2024-05-30 ENCOUNTER — Encounter: Admitting: *Deleted

## 2024-05-30 DIAGNOSIS — J439 Emphysema, unspecified: Secondary | ICD-10-CM | POA: Diagnosis not present

## 2024-05-30 NOTE — Progress Notes (Signed)
 Daily Session Note  Patient Details  Name: Dillon [name] MRN: [id number] Date of Birth: [birth year] Referring Provider:   Flowsheet Row Pulmonary Rehab from 05/23/2024 in [facility] Cardiac and Pulmonary Rehab  Referring Provider Theotis Chancellor, MD    Encounter Date: 05/30/2024  Check In:  Session Check In - 05/30/24 1547       Check-In   Supervising physician immediately available to respond to emergencies See telemetry face sheet for immediately available ER MD    Location [facility]-Cardiac & Pulmonary Rehab    Staff Present Hoy Rodney RN,BSN;Joseph [facility] RCP,RRT,BSRT;Margaret Best, MS, Exercise Physiologist;Maxon Conetta BS, Exercise Physiologist    Virtual Visit No    Medication changes reported     No    Fall or balance concerns reported    No    Warm-up and Cool-down Performed on first and last piece of equipment    Resistance Training Performed Yes    PAD/SET Patient? No      Pain Assessment   Currently in Pain? No/denies             Social History   Tobacco Use  Smoking Status Former   Current packs/day: 0.00   Average packs/day: 2.0 packs/day for 37.0 years (74.0 ttl pk-yrs)   Types: Cigarettes   Start date: 35   Quit date: 2010   Years since quitting: 15.6  Smokeless Tobacco Never    Goals Met:  Proper associated with RPD/PD & O2 Sat Independence with exercise equipment Using PLB without cueing & demonstrates good technique Exercise tolerated well No report of concerns or symptoms today Strength training completed today  Goals Unmet:  Not Applicable  Comments: Pt able to follow exercise prescription today without complaint.  Will continue to monitor for progression.    Dr. Oneil Pinal is Medical Director for [facility] Cardiac Rehabilitation.  Dr. Fuad Aleskerov is Medical Director for [facility] Pulmonary Rehabilitation.

## 2024-06-01 ENCOUNTER — Encounter

## 2024-06-01 DIAGNOSIS — J439 Emphysema, unspecified: Secondary | ICD-10-CM

## 2024-06-01 NOTE — Progress Notes (Signed)
 Daily Session Note  Patient Details  Name: Dillon [name] MRN: [id number] Date of Birth: 20-Jan-1956 Referring Provider:   Flowsheet Row Pulmonary Rehab from 05/23/2024 in [facility] Cardiac and Pulmonary Rehab  Referring Provider Theotis Chancellor, MD    Encounter Date: 06/01/2024  Check In:  Session Check In - 06/01/24 1539       Check-In   Supervising physician immediately available to respond to emergencies See telemetry face sheet for immediately available ER MD    Location [facility]-Cardiac & Pulmonary Rehab    Staff Present Maxon Burnell HECKLE, Exercise Physiologist;Joseph Hood RCP,RRT,BSRT;Yesenia Fontenette RN,BSN,MPA;Coleby Yett Superior BS, ACSM CEP, Exercise Physiologist;Meredith Tressa RN,BSN    Virtual Visit No    Medication changes reported     No    Fall or balance concerns reported    No    Warm-up and Cool-down Performed on first and last piece of equipment    Resistance Training Performed Yes    VAD Patient? No    PAD/SET Patient? No      Pain Assessment   Currently in Pain? No/denies             Social History   Tobacco Use  Smoking Status Former   Current packs/day: 0.00   Average packs/day: 2.0 packs/day for 37.0 years (74.0 ttl pk-yrs)   Types: Cigarettes   Start date: 89   Quit date: 2010   Years since quitting: 15.6  Smokeless Tobacco Never    Goals Met:  Proper associated with RPD/PD & O2 Sat Independence with exercise equipment Using PLB without cueing & demonstrates good technique Exercise tolerated well No report of concerns or symptoms today Strength training completed today  Goals Unmet:  Not Applicable  Comments: Pt able to follow exercise prescription today without complaint.  Will continue to monitor for progression.    Dr. Oneil Pinal is Medical Director for [facility] Cardiac Rehabilitation.  Dr. Fuad Aleskerov is Medical Director for [facility] Pulmonary Rehabilitation.

## 2024-06-02 ENCOUNTER — Encounter: Admitting: Emergency Medicine

## 2024-06-02 DIAGNOSIS — J439 Emphysema, unspecified: Secondary | ICD-10-CM | POA: Diagnosis not present

## 2024-06-02 NOTE — Progress Notes (Signed)
 Daily Session Note  Patient Details  Name: Dillon [name] MRN: [id number] Date of Birth: [birth year] Referring Provider:   Flowsheet Row Pulmonary Rehab from 05/23/2024 in [facility] Cardiac and Pul[facility] Rehab  Referring Provider Theotis Chancellor, MD    Encounter Date: 06/02/2024  Check In:  Session Check In - 06/02/24 1539       Check-In   Supervising physician immediately available to respond to emergencies See telemetry face sheet for immediately available ER MD    Location [facility]-Cardiac & Pulmonary Rehab    Staff Present Fairy Plater RCP,RRT,BSRT;Maxon Conetta BS, Exercise Physiologist;Noah Tickle, BS, Exercise Physiologist    Virtual Visit No    Medication changes reported     No    Fall or balance concerns reported    No    Warm-up and Cool-down Performed on first and last piece of equipment    Resistance Training Performed Yes    VAD Patient? No    PAD/SET Patient? No      Pain Assessment   Currently in Pain? No/denies             Social History   Tobacco Use  Smoking Status Former   Current packs/day: 0.00   Average packs/day: 2.0 packs/day for 37.0 years (74.0 ttl pk-yrs)   Types: Cigarettes   Start date: 33   Quit date: 2010   Years since quitting: 15.6  Smokeless Tobacco Never    Goals Met:  Proper associated with RPD/PD & O2 Sat Independence with exercise equipment Using PLB without cueing & demonstrates good technique Exercise tolerated well No report of concerns or symptoms today Strength training completed today  Goals Unmet:  Not Applicable  Comments: Pt able to follow exercise prescription today without complaint.  Will continue to monitor for progression.    Dr. Oneil Pinal is Medical Director for [facility] Cardiac Rehabilitation.  Dr. Fuad Aleskerov is Medical Director for [facility] Pulmonary Rehabilitation.

## 2024-06-13 ENCOUNTER — Encounter

## 2024-06-15 ENCOUNTER — Encounter

## 2024-06-15 DIAGNOSIS — J439 Emphysema, unspecified: Secondary | ICD-10-CM

## 2024-06-15 NOTE — Progress Notes (Signed)
 Pulmonary Individual Treatment Plan  Patient Details  Name: Dillon [name] MRN: [id number] Date of Birth: [birth year] Referring Provider:   Flowsheet Row Pulmonary Rehab from 05/23/2024 in [facility] Cardiac and Pulmonary Rehab  Referring Provider Theotis Chancellor, MD    Initial Encounter Date:  Flowsheet Row Pulmonary Rehab from 05/23/2024 in [facility] Cardiac and Pulmonary Rehab  Date 05/23/24    Visit Diagnosis: Pulmonary emphysema, unspecified emphysema type (HCC)  Patient's Home Medications on Admission:  Current Outpatient Medications:    acetaminophen  (TYLENOL ) 500 MG tablet, Take 1,000 mg by mouth every 8 (eight) hours as needed for moderate pain., Disp: , Rfl:    albuterol  (PROVENTIL ) (2.5 MG/3ML) 0.083% nebulizer solution, SMARTSIG:3 Milliliter(s) Via Nebulizer Every 6 Hours PRN, Disp: , Rfl:    ALPHA LIPOIC ACID PO, Take 500-1,000 mg by mouth daily., Disp: , Rfl:    atorvastatin  (LIPITOR ) 40 MG tablet, Take 1 tablet (40 mg total) by mouth daily., Disp: 90 tablet, Rfl: 3   carvedilol  (COREG ) 6.25 MG tablet, Take 1 tablet (6.25 mg total) by mouth 2 (two) times daily with a meal., Disp: 180 tablet, Rfl: 3   clopidogrel  (PLAVIX ) 75 MG tablet, Take by mouth., Disp: , Rfl:    enoxaparin  (LOVENOX ) 40 MG/0.4ML injection, Inject 0.4 mLs (40 mg total) into the skin daily. (Patient not taking: Reported on 05/18/2024), Disp: , Rfl:    fluconazole (DIFLUCAN) 200 MG tablet, Take 200 mg by mouth once a week., Disp: , Rfl:    Fluticasone -Umeclidin-Vilant (TRELEGY ELLIPTA) 100-62.5-25 MCG/ACT AEPB, Inhale 1 puff into the lungs daily., Disp: 60 each, Rfl: 1   furosemide  (LASIX ) 20 MG tablet, Take 20 mg by mouth daily. (Patient not taking: Reported on 05/18/2024), Disp: , Rfl:    nitroGLYCERIN  (NITROSTAT ) 0.4 MG SL tablet, Place 1 tablet (0.4 mg total) under the tongue every 5 (five) minutes x 3 doses as needed for chest pain., Disp: 25 tablet, Rfl: 3   OXYGEN , Inhale 4 L into the lungs continuous., Disp: , Rfl:     pantoprazole  (PROTONIX ) 40 MG tablet, Take 40 mg by mouth daily., Disp: , Rfl:    pantoprazole  (PROTONIX ) 40 MG tablet, Take 1 tablet by mouth daily., Disp: , Rfl:    Pirfenidone  (ESBRIET ) 267 MG CAPS, Take 3 capsules by mouth 3 (three) times daily., Disp: , Rfl:    Pirfenidone  801 MG TABS, Take by mouth., Disp: , Rfl:    potassium chloride (KLOR-CON M) 10 MEQ tablet, Take 10 mEq by mouth daily. (Patient not taking: Reported on 05/18/2024), Disp: , Rfl:    predniSONE  (DELTASONE ) 10 MG tablet, Take 10 mg by mouth daily., Disp: , Rfl:    predniSONE  (DELTASONE ) 20 MG tablet, Take 20 mg by mouth daily., Disp: , Rfl:    roflumilast  (DALIRESP ) 500 MCG TABS tablet, Take 1 tablet by mouth daily. Every other day, Disp: , Rfl:    Treprostinil  (TYVASO  STARTER KIT) 0.6 MG/ML SOLN, Inhale 18 mcg into the lungs 4 (four) times daily. (Patient not taking: Reported on 05/18/2024), Disp: , Rfl:   Past Medical History: Past Medical History:  Diagnosis Date   Chronic cough    COPD (chronic obstructive pulmonary disease) (HCC)    Coronary artery disease    Edentulous    Elevated cholesterol    GERD (gastroesophageal reflux disease)    Myocardial infarction (HCC)    Pulmonary fibrosis (HCC)    Restless leg syndrome    Supplemental oxygen  dependent     Tobacco Use: Social History  Tobacco Use  Smoking Status Former   Current packs/day: 0.00   Average packs/day: 2.0 packs/day for 37.0 years (74.0 ttl pk-yrs)   Types: Cigarettes   Start date: 53   Quit date: 2010   Years since quitting: 15.6  Smokeless Tobacco Never    Labs: Review Flowsheet  More data exists      Latest Ref Rng & Units 09/30/2021 10/01/2021 07/09/2023 11/16/2023 02/24/2024  Labs for ITP Cardiac and Pulmonary Rehab  Cholestrol 100 - 199 mg/dL - 881  857  - -  LDL (calc) 0 - 99 mg/dL - 64  70  - -  HDL-C >60 mg/dL - 45  52  - -  Trlycerides 0 - 149 mg/dL - 45  891  - -  Hemoglobin A1c 4.8 - 5.6 % 5.7  - - - -  Bicarbonate  20.0 - 28.0 mmol/L - - - 29.2  24.4   Acid-base deficit 0.0 - 2.0 mmol/L - - - - 4.5   O2 Saturation % - - - 65  91      Pulmonary Assessment Scores:  Pulmonary Assessment Scores     Row Name 05/23/24 1320         ADL UCSD   ADL Phase Entry     SOB Score total 108     Rest 1     Walk 5     Stairs 5     Bath 5     Dress 4     Shop 5       CAT Score   CAT Score 31       mMRC Score   mMRC Score 3        UCSD: Self-administered rating of dyspnea associated with activities of daily living (ADLs) 6-point scale (0 = not at all to 5 = maximal or unable to do because of breathlessness)  Scoring Scores range from 0 to 120.  Minimally important difference is 5 units  CAT: CAT can identify the health impairment of COPD patients and is better correlated with disease progression.  CAT has a scoring range of zero to 40. The CAT score is classified into four groups of low (less than 10), medium (10 - 20), high (21-30) and very high (31-40) based on the impact level of disease on health status. A CAT score over 10 suggests significant symptoms.  A worsening CAT score could be explained by an exacerbation, poor medication adherence, poor inhaler technique, or progression of COPD or comorbid conditions.  CAT MCID is 2 points  mMRC: mMRC (Modified Medical Research Council) Dyspnea Scale is used to assess the degree of baseline functional disability in patients of respiratory disease due to dyspnea. No minimal important difference is established. A decrease in score of 1 point or greater is considered a positive change.   Pulmonary Function Assessment:  Pulmonary Function Assessment - 05/18/24 1434       Breath   Shortness of Breath Yes;Limiting activity;Fear of Shortness of Breath;Panic with Shortness of Breath          Exercise Target Goals: Exercise Program Goal: Individual exercise prescription set using results from initial 6 min walk test and THRR while considering   patient's activity barriers and safety.   Exercise Prescription Goal: Initial exercise prescription builds to 30-45 minutes a day of aerobic activity, 2-3 days per week.  Home exercise guidelines will be given to patient during program as part of exercise prescription that the participant will acknowledge.  Education:  Aerobic Exercise: - Group verbal and visual presentation on the components of exercise prescription. Introduces F.I.T.T principle from ACSM for exercise prescriptions.  Reviews F.I.T.T. principles of aerobic exercise including progression. Written material provided at class time.   Education: Resistance Exercise: - Group verbal and visual presentation on the components of exercise prescription. Introduces F.I.T.T principle from ACSM for exercise prescriptions  Reviews F.I.T.T. principles of resistance exercise including progression. Written material provided at class time.    Education: Exercise & Equipment Safety: - Individual verbal instruction and demonstration of equipment use and safety with use of the equipment. Flowsheet Row Pulmonary Rehab from 06/01/2024 in [facility] Cardiac and Pulmonary Rehab  Date 05/23/24  Educator MB  Instruction Review Code 1- Verbalizes Understanding    Education: Exercise Physiology & General Exercise Guidelines: - Group verbal and written instruction with models to review the exercise physiology of the cardiovascular system and associated critical values. Provides general exercise guidelines with specific guidelines to those with heart or lung disease.    Education: Flexibility, Balance, Mind/Body Relaxation: - Group verbal and visual presentation with interactive activity on the components of exercise prescription. Introduces F.I.T.T principle from ACSM for exercise prescriptions. Reviews F.I.T.T. principles of flexibility and balance exercise training including progression. Also discusses the mind body connection.  Reviews various relaxation  techniques to help reduce and manage stress (i.e. Deep breathing, progressive muscle relaxation, and visualization). Balance handout provided to take home. Written material provided at class time.   Activity Barriers & Risk Stratification:  Activity Barriers & Cardiac Risk Stratification - 05/23/24 1315       Activity Barriers & Cardiac Risk Stratification   Activity Barriers Shortness of Breath;Other (comment)    Comments neuropathy          6 Minute Walk:  6 Minute Walk     Row Name 05/23/24 1312         6 Minute Walk   Phase Initial     Distance 385 feet     Walk Time 3.13 minutes     # of Rest Breaks 2     MPH 1.4     METS 1.73     RPE 17     Perceived Dyspnea  4     VO2 Peak 6.05     Symptoms Yes (comment)     Comments SOB     Resting HR 67 bpm     Resting BP 120/60     Resting Oxygen  Saturation  90 %     Exercise Oxygen  Saturation  during 6 min walk 72 %     Max Ex. HR 90 bpm     Max Ex. BP 122/60     2 Minute Post BP 128/64       Interval HR   1 Minute HR 68     2 Minute HR 75     3 Minute HR 90     4 Minute HR 78     5 Minute HR 75     6 Minute HR 83     2 Minute Post HR 76     Interval Heart Rate? Yes       Interval Oxygen    Interval Oxygen ? Yes     Baseline Oxygen  Saturation % 90 %     1 Minute Oxygen  Saturation % 89 %     1 Minute Liters of Oxygen  10 L     2 Minute Oxygen  Saturation % 72 %     2 Minute  Liters of Oxygen  15 L     3 Minute Oxygen  Saturation % 78 %     3 Minute Liters of Oxygen  15 L     4 Minute Oxygen  Saturation % 80 %     4 Minute Liters of Oxygen  15 L     5 Minute Oxygen  Saturation % 84 %     5 Minute Liters of Oxygen  15 L     6 Minute Oxygen  Saturation % 74 %     6 Minute Liters of Oxygen  15 L     2 Minute Post Oxygen  Saturation % 86 %     2 Minute Post Liters of Oxygen  10 L       Oxygen  Initial Assessment:  Oxygen  Initial Assessment - 05/18/24 1432       Home Oxygen    Home Oxygen  Device Home Concentrator;Portable  Concentrator;E-Tanks    Sleep Oxygen  Prescription Continuous    Liters per minute 6    Home Exercise Oxygen  Prescription Continuous    Liters per minute 6    Home Resting Oxygen  Prescription Continuous    Liters per minute 6    Compliance with Home Oxygen  Use Yes      Initial 6 min Walk   Oxygen  Used Continuous    Liters per minute 6      Program Oxygen  Prescription   Program Oxygen  Prescription Continuous    Liters per minute 6      Intervention   Short Term Goals To learn and exhibit compliance with exercise, home and travel O2 prescription;To learn and understand importance of monitoring SPO2 with pulse oximeter and demonstrate accurate use of the pulse oximeter.;To learn and demonstrate proper pursed lip breathing techniques or other breathing techniques. ;To learn and understand importance of maintaining oxygen  saturations>88%;To learn and demonstrate proper use of respiratory medications    Long  Term Goals Exhibits compliance with exercise, home  and travel O2 prescription;Maintenance of O2 saturations>88%;Compliance with respiratory medication;Demonstrates proper use of MDI's;Exhibits proper breathing techniques, such as pursed lip breathing or other method taught during program session;Verbalizes importance of monitoring SPO2 with pulse oximeter and return demonstration          Oxygen  Re-Evaluation:  Oxygen  Re-Evaluation     Row Name 05/25/24 1709             Program Oxygen  Prescription   Program Oxygen  Prescription Continuous       Liters per minute 6         Home Oxygen    Home Oxygen  Device Home Concentrator;Portable Concentrator;E-Tanks       Sleep Oxygen  Prescription Continuous       Liters per minute 6       Home Exercise Oxygen  Prescription Continuous       Liters per minute 6       Home Resting Oxygen  Prescription Continuous       Liters per minute 6       Compliance with Home Oxygen  Use Yes         Goals/Expected Outcomes   Short Term Goals To learn  and exhibit compliance with exercise, home and travel O2 prescription;To learn and understand importance of monitoring SPO2 with pulse oximeter and demonstrate accurate use of the pulse oximeter.;To learn and demonstrate proper pursed lip breathing techniques or other breathing techniques. ;To learn and understand importance of maintaining oxygen  saturations>88%;To learn and demonstrate proper use of respiratory medications       Long  Term Goals Exhibits compliance with  exercise, home  and travel O2 prescription;Maintenance of O2 saturations>88%;Compliance with respiratory medication;Demonstrates proper use of MDI's;Exhibits proper breathing techniques, such as pursed lip breathing or other method taught during program session;Verbalizes importance of monitoring SPO2 with pulse oximeter and return demonstration       Comments Reviewed PLB technique with pt.  Talked about how it works and it's importance in maintaining their exercise saturations.       Goals/Expected Outcomes Short: Become more profiecient at using PLB. Long: Become independent at using PLB.          Oxygen  Discharge (Final Oxygen  Re-Evaluation):  Oxygen  Re-Evaluation - 05/25/24 1709       Program Oxygen  Prescription   Program Oxygen  Prescription Continuous    Liters per minute 6      Home Oxygen    Home Oxygen  Device Home Concentrator;Portable Concentrator;E-Tanks    Sleep Oxygen  Prescription Continuous    Liters per minute 6    Home Exercise Oxygen  Prescription Continuous    Liters per minute 6    Home Resting Oxygen  Prescription Continuous    Liters per minute 6    Compliance with Home Oxygen  Use Yes      Goals/Expected Outcomes   Short Term Goals To learn and exhibit compliance with exercise, home and travel O2 prescription;To learn and understand importance of monitoring SPO2 with pulse oximeter and demonstrate accurate use of the pulse oximeter.;To learn and demonstrate proper pursed lip breathing techniques or other  breathing techniques. ;To learn and understand importance of maintaining oxygen  saturations>88%;To learn and demonstrate proper use of respiratory medications    Long  Term Goals Exhibits compliance with exercise, home  and travel O2 prescription;Maintenance of O2 saturations>88%;Compliance with respiratory medication;Demonstrates proper use of MDI's;Exhibits proper breathing techniques, such as pursed lip breathing or other method taught during program session;Verbalizes importance of monitoring SPO2 with pulse oximeter and return demonstration    Comments Reviewed PLB technique with pt.  Talked about how it works and it's importance in maintaining their exercise saturations.    Goals/Expected Outcomes Short: Become more profiecient at using PLB. Long: Become independent at using PLB.          Initial Exercise Prescription:  Initial Exercise Prescription - 05/23/24 1300       Date of Initial Exercise RX and Referring Provider   Date 05/23/24    Referring Provider Theotis Chancellor, MD      Oxygen    Oxygen  Continuous    Liters 6-15    Maintain Oxygen  Saturation 88% or higher      Treadmill   MPH 1   Try   Grade 0    Minutes 15    METs 1.73      Recumbant Bike   Level 1    RPM 50    Watts 15    Minutes 15    METs 1.73      NuStep   Level 1    SPM 80    Minutes 15    METs 1.73      Biostep-RELP   Level 1    SPM 50    Minutes 15    METs 1.73      Track   Laps 9    Minutes 15    METs 1.49      Prescription Details   Frequency (times per week) 3    Duration Progress to 30 minutes of continuous aerobic without signs/symptoms of physical distress      Intensity   THRR  40-80% of Max Heartrate 101-135    Ratings of Perceived Exertion 11-13    Perceived Dyspnea 0-4      Progression   Progression Continue to progress workloads to maintain intensity without signs/symptoms of physical distress.      Resistance Training   Training Prescription Yes    Weight 5 lb     Reps 10-15          Perform Capillary Blood Glucose checks as needed.  Exercise Prescription Changes:   Exercise Prescription Changes     Row Name 05/23/24 1300 06/01/24 1100           Response to Exercise   Blood Pressure (Admit) 120/60 110/58      Blood Pressure (Exercise) 122/60 118/60      Blood Pressure (Exit) 128/64 118/64      Heart Rate (Admit) 67 bpm 76 bpm      Heart Rate (Exercise) 90 bpm 87 bpm      Heart Rate (Exit) 67 bpm 75 bpm      Oxygen  Saturation (Admit) 90 % 88 %      Oxygen  Saturation (Exercise) 72 % 80 %      Oxygen  Saturation (Exit) 89 % 90 %      Rating of Perceived Exertion (Exercise) 17 11      Perceived Dyspnea (Exercise) 4 2      Symptoms SOB --      Comments results 1st day      Duration -- Progress to 30 minutes of  aerobic without signs/symptoms of physical distress      Intensity -- THRR unchanged        Progression   Progression -- Continue to progress workloads to maintain intensity without signs/symptoms of physical distress.      Average METs 1.73 1.55        Resistance Training   Training Prescription -- Yes      Weight -- 5 lb      Reps -- 10-15        Interval Training   Interval Training -- No        Oxygen    Oxygen  -- Continuous      Liters -- 6        NuStep   Level -- 1      Minutes -- 30      METs -- 1.6        Oxygen    Maintain Oxygen  Saturation -- 88% or higher         Exercise Comments:   Exercise Comments     Row Name 05/25/24 1708           Exercise Comments First full day of exercise!  Patient was oriented to gym and equipment including functions, settings, policies, and procedures.  Patient's individual exercise prescription and treatment plan were reviewed.  All starting workloads were established based on the results of the 6 minute walk test done at initial orientation visit.  The plan for exercise progression was also introduced and progression will be customized based on patient's  performance and goals.          Exercise Goals and Review:   Exercise Goals     Row Name 05/23/24 1319             Exercise Goals   Increase Physical Activity Yes       Intervention Provide advice, education, support and counseling about physical activity/exercise needs.;Develop an individualized exercise prescription for aerobic and  resistive training based on initial evaluation findings, risk stratification, comorbidities and participant's personal goals.       Expected Outcomes Short Term: Attend rehab on a regular basis to increase amount of physical activity.;Long Term: Add in home exercise to make exercise part of routine and to increase amount of physical activity.;Long Term: Exercising regularly at least 3-5 days a week.       Increase Strength and Stamina Yes       Intervention Provide advice, education, support and counseling about physical activity/exercise needs.;Develop an individualized exercise prescription for aerobic and resistive training based on initial evaluation findings, risk stratification, comorbidities and participant's personal goals.       Expected Outcomes Short Term: Increase workloads from initial exercise prescription for resistance, speed, and METs.;Short Term: Perform resistance training exercises routinely during rehab and add in resistance training at home;Long Term: Improve cardiorespiratory fitness, muscular endurance and strength as measured by increased METs and functional capacity ( )       Able to understand and use rate of perceived exertion (RPE) scale Yes       Intervention Provide education and explanation on how to use RPE scale       Expected Outcomes Short Term: Able to use RPE daily in rehab to express subjective intensity level;Long Term:  Able to use RPE to guide intensity level when exercising independently       Able to understand and use Dyspnea scale Yes       Intervention Provide education and explanation on how to use Dyspnea scale        Expected Outcomes Short Term: Able to use Dyspnea scale daily in rehab to express subjective sense of shortness of breath during exertion;Long Term: Able to use Dyspnea scale to guide intensity level when exercising independently       Knowledge and understanding of Target Heart Rate Range (THRR) Yes       Intervention Provide education and explanation of THRR including how the numbers were predicted and where they are located for reference       Expected Outcomes Short Term: Able to state/look up THRR;Short Term: Able to use daily as guideline for intensity in rehab;Long Term: Able to use THRR to govern intensity when exercising independently       Able to check pulse independently Yes       Intervention Provide education and demonstration on how to check pulse in carotid and radial arteries.;Review the importance of being able to check your own pulse for safety during independent exercise       Expected Outcomes Short Term: Able to explain why pulse checking is important during independent exercise;Long Term: Able to check pulse independently and accurately       Understanding of Exercise Prescription Yes       Intervention Provide education, explanation, and written materials on patient's individual exercise prescription       Expected Outcomes Short Term: Able to explain program exercise prescription;Long Term: Able to explain home exercise prescription to exercise independently          Exercise Goals Re-Evaluation :  Exercise Goals Re-Evaluation     Row Name 05/25/24 1709 06/01/24 1122           Exercise Goal Re-Evaluation   Exercise Goals Review Increase Physical Activity;Increase Strength and Stamina;Able to understand and use rate of perceived exertion (RPE) scale;Able to understand and use Dyspnea scale;Knowledge and understanding of Target Heart Rate Range (THRR);Able to check pulse independently;Understanding of Exercise  Prescription Increase Physical Activity;Understanding  of Exercise Prescription;Increase Strength and Stamina      Comments Reviewed RPE and dyspnea scale, THR and program prescription with pt today.  Pt voiced understanding and was given a copy of goals to take home. Collier is off to a good start in the program and completed his first day in this review. He used the T4 nustep at level 1 for 30 minutes. His SaO2 did drop down to 80 and increased back up to 88 with rest. We will continue to monitor his progress in the program.      Expected Outcomes Short: Use RPE daily to regulate intensity. Long: Follow program prescription in THR. Short: Continue to follow current exercise prescription. Long: Continue exercise to improve strength and stamina.         Discharge Exercise Prescription (Final Exercise Prescription Changes):  Exercise Prescription Changes - 06/01/24 1100       Response to Exercise   Blood Pressure (Admit) 110/58    Blood Pressure (Exercise) 118/60    Blood Pressure (Exit) 118/64    Heart Rate (Admit) 76 bpm    Heart Rate (Exercise) 87 bpm    Heart Rate (Exit) 75 bpm    Oxygen  Saturation (Admit) 88 %    Oxygen  Saturation (Exercise) 80 %    Oxygen  Saturation (Exit) 90 %    Rating of Perceived Exertion (Exercise) 11    Perceived Dyspnea (Exercise) 2    Comments 1st day    Duration Progress to 30 minutes of  aerobic without signs/symptoms of physical distress    Intensity THRR unchanged      Progression   Progression Continue to progress workloads to maintain intensity without signs/symptoms of physical distress.    Average METs 1.55      Resistance Training   Training Prescription Yes    Weight 5 lb    Reps 10-15      Interval Training   Interval Training No      Oxygen    Oxygen  Continuous    Liters 6      NuStep   Level 1    Minutes 30    METs 1.6      Oxygen    Maintain Oxygen  Saturation 88% or higher          Nutrition:  Target Goals: Understanding of nutrition guidelines, daily intake of sodium 1500mg ,  cholesterol 200mg , calories 30% from fat and 7% or less from saturated fats, daily to have 5 or more servings of fruits and vegetables.  Education: Nutrition 1 -Group instruction provided by verbal, written material, interactive activities, discussions, models, and posters to present general guidelines for heart healthy nutrition including macronutrients, label reading, and promoting whole foods over processed counterparts. Education serves as Pensions consultant of discussion of heart healthy eating for all. Written material provided at class time.     Education: Nutrition 2 -Group instruction provided by verbal, written material, interactive activities, discussions, models, and posters to present general guidelines for heart healthy nutrition including sodium, cholesterol, and saturated fat. Providing guidance of habit forming to improve blood pressure, cholesterol, and body weight. Written material provided at class time.     Biometrics:  Pre Biometrics - 05/23/24 1319       Pre Biometrics   Height 6' 2.76 (1.899 m)    Weight 185 lb 3.2 oz (84 kg)    Waist Circumference 39.8 inches    Hip Circumference 39 inches    Waist to Hip Ratio 1.02 %  BMI (Calculated) 23.29    Single Leg Stand 3.7 seconds           Nutrition Therapy Plan and Nutrition Goals:  Nutrition Therapy & Goals - 05/23/24 1322       Nutrition Therapy   RD appointment deferred Yes      Personal Nutrition Goals   Nutrition Goal RD appointment deferred at this time      Intervention Plan   Intervention Prescribe, educate and counsel regarding individualized specific dietary modifications aiming towards targeted core components such as weight, hypertension, lipid management, diabetes, heart failure and other comorbidities.    Expected Outcomes Short Term Goal: Understand basic principles of dietary content, such as calories, fat, sodium, cholesterol and nutrients.          Nutrition Assessments:  MEDIFICTS  Score Key: >=70 Need to make dietary changes  40-70 Heart Healthy Diet <= 40 Therapeutic Level Cholesterol Diet  Flowsheet Row Pulmonary Rehab from 05/23/2024 in [facility] Cardiac and Pulmonary Rehab  Picture Your Plate Total Score on Admission 46   Picture Your Plate Scores: <59 Unhealthy dietary pattern with much room for improvement. 41-50 Dietary pattern unlikely to meet recommendations for good health and room for improvement. 51-60 More healthful dietary pattern, with some room for improvement.  >60 Healthy dietary pattern, although there may be some specific behaviors that could be improved.   Nutrition Goals Re-Evaluation:   Nutrition Goals Discharge (Final Nutrition Goals Re-Evaluation):   Psychosocial: Target Goals: Acknowledge presence or absence of significant depression and/or stress, maximize coping skills, provide positive support system. Participant is able to verbalize types and ability to use techniques and skills needed for reducing stress and depression.   Education: Stress, Anxiety, and Depression - Group verbal and visual presentation to define topics covered.  Reviews how body is impacted by stress, anxiety, and depression.  Also discusses healthy ways to reduce stress and to treat/manage anxiety and depression.  Written material provided at class time.   Education: Sleep Hygiene -Provides group verbal and written instruction about how sleep can affect your health.  Define sleep hygiene, discuss sleep cycles and impact of sleep habits. Review good sleep hygiene tips.    Initial Review & Psychosocial Screening:  Initial Psych Review & Screening - 05/18/24 1437       Initial Review   Current issues with Current Stress Concerns    Source of Stress Concerns Chronic Illness      Family Dynamics   Good Support System? Yes    Comments He can look to his wife, daughters and son. He is wanting to get on the transplant list for his lungs. He takes no medication for his  mood.      Barriers   Psychosocial barriers to participate in program The patient should benefit from training in stress management and relaxation.;There are no identifiable barriers or psychosocial needs.      Screening Interventions   Interventions Provide feedback about the scores to participant;To provide support and resources with identified psychosocial needs;Encouraged to exercise    Expected Outcomes Short Term goal: Utilizing psychosocial counselor, staff and physician to assist with identification of specific Stressors or current issues interfering with healing process. Setting desired goal for each stressor or current issue identified.;Long Term Goal: Stressors or current issues are controlled or eliminated.;Short Term goal: Identification and review with participant of any Quality of Life or Depression concerns found by scoring the questionnaire.;Long Term goal: The participant improves quality of Life and PHQ9 Scores as  seen by post scores and/or verbalization of changes          Quality of Life Scores:  Scores of 19 and below usually indicate a poorer quality of life in these areas.  A difference of  2-3 points is a clinically meaningful difference.  A difference of 2-3 points in the total score of the Quality of Life Index has been associated with significant improvement in overall quality of life, self-image, physical symptoms, and general health in studies assessing change in quality of life.  PHQ-9: Review Flowsheet       05/23/2024 12/23/2021 08/12/2021 09/07/2020  Depression screen PHQ 2/9  Decreased Interest 0 0 0 0  Down, Depressed, Hopeless 0 0 0 0  PHQ - 2 Score 0 0 0 0  Altered sleeping 1 - - -  Tired, decreased energy 1 - - -  Change in appetite 0 - - -  Feeling bad or failure about yourself  0 - - -  Trouble concentrating 1 - - -  Moving slowly or fidgety/restless 1 - - -  Suicidal thoughts 0 - - -  PHQ-9 Score 4 - - -  Difficult doing work/chores Somewhat  difficult - - -   Interpretation of Total Score  Total Score Depression Severity:  1-4 = Minimal depression, 5-9 = Mild depression, 10-14 = Moderate depression, 15-19 = Moderately severe depression, 20-27 = Severe depression   Psychosocial Evaluation and Intervention:  Psychosocial Evaluation - 05/18/24 1438       Psychosocial Evaluation & Interventions   Interventions Stress management education;Relaxation education;Encouraged to exercise with the program and follow exercise prescription    Comments He can look to his wife, daughters and son. He is wanting to get on the transplant list for his lungs. He takes no medication for his mood.    Expected Outcomes Short: Start [facility] to help with mood. Long: Maintain a healthy mental state.    Continue Psychosocial Services  Follow up required by staff          Psychosocial Re-Evaluation:   Psychosocial Discharge (Final Psychosocial Re-Evaluation):   Education: Education Goals: Education classes will be provided on a weekly basis, covering required topics. Participant will state understanding/return demonstration of topics presented.  Learning Barriers/Preferences:  Learning Barriers/Preferences - 05/18/24 1435       Learning Barriers/Preferences   Learning Barriers None    Learning Preferences None          General Pulmonary Education Topics:  Infection Prevention: - Provides verbal and written material to individual with discussion of infection control including proper hand washing and proper equipment cleaning during exercise session. Flowsheet Row Pulmonary Rehab from 06/01/2024 in [facility] Cardiac and Pulmonary Rehab  Date 05/23/24  Educator MB  Instruction Review Code 1- Verbalizes Understanding    Falls Prevention: - Provides verbal and written material to individual with discussion of falls prevention and safety. Flowsheet Row Pulmonary Rehab from 06/01/2024 in [facility] Cardiac and Pulmonary Rehab  Date 05/23/24   Educator MB  Instruction Review Code 1- Verbalizes Understanding    Chronic Lung Disease Review: - Group verbal instruction with posters, models, PowerPoint presentations and videos,  to review new updates, new respiratory medications, new advancements in procedures and treatments. Providing information on websites and 800 numbers for continued self-education. Includes information about supplement oxygen , available portable oxygen  systems, continuous and intermittent flow rates, oxygen  safety, concentrators, and Medicare reimbursement for oxygen . Explanation of Pulmonary Drugs, including class, frequency, complications, importance of spacers, rinsing mouth  after steroid MDI's, and proper cleaning methods for nebulizers. Review of basic lung anatomy and physiology related to function, structure, and complications of lung disease. Review of risk factors. Discussion about methods for diagnosing sleep apnea and types of masks and machines for OSA. Includes a review of the use of types of environmental controls: home humidity, furnaces, filters, dust mite/pet prevention, HEPA vacuums. Discussion about weather changes, air quality and the benefits of nasal washing. Instruction on Warning signs, infection symptoms, calling MD promptly, preventive modes, and value of vaccinations. Review of effective airway clearance, coughing and/or vibration techniques. Emphasizing that all should Create an Action Plan. Written material provided at class time. Flowsheet Row Pulmonary Rehab from 06/01/2024 in [facility] Cardiac and Pulmonary Rehab  Education need identified 05/23/24    AED/CPR: - Group verbal and written instruction with the use of models to demonstrate the basic use of the AED with the basic ABC's of resuscitation.    Tests and Procedures:  - Group verbal and visual presentation and models provide information about basic cardiac anatomy and function. Reviews the testing methods done to diagnose heart disease  and the outcomes of the test results. Describes the treatment choices: Medical Management, Angioplasty, or Coronary Bypass Surgery for treating various heart conditions including Myocardial Infarction, Angina, Valve Disease, and Cardiac Arrhythmias.  Written material provided at class time.   Medication Safety: - Group verbal and visual instruction to review commonly prescribed medications for heart and lung disease. Reviews the medication, class of the drug, and side effects. Includes the steps to properly store meds and maintain the prescription regimen.  Written material given at graduation.   Other: -Provides group and verbal instruction on various topics (see comments)   Knowledge Questionnaire Score:  Knowledge Questionnaire Score - 05/23/24 1326       Knowledge Questionnaire Score   Pre Score 13/18           Core Components/Risk Factors/Patient Goals at Admission:  Personal Goals and Risk Factors at Admission - 05/23/24 1326       Core Components/Risk Factors/Patient Goals on Admission    Weight Management Yes;Weight Maintenance    Intervention Weight Management: Develop a combined nutrition and exercise program designed to reach desired caloric intake, while maintaining appropriate intake of nutrient and fiber, sodium and fats, and appropriate energy expenditure required for the weight goal.;Weight Management: Provide education and appropriate resources to help participant work on and attain dietary goals.;Weight Management/Obesity: Establish reasonable short term and long term weight goals.    Admit Weight 185 lb 3.2 oz (84 kg)    Goal Weight: Short Term 185 lb 3.2 oz (84 kg)    Goal Weight: Long Term 185 lb 3.2 oz (84 kg)    Expected Outcomes Weight Maintenance: Understanding of the daily nutrition guidelines, which includes 25-35% calories from fat, 7% or less cal from saturated fats, less than 200mg  cholesterol, less than 1.5gm of sodium, & 5 or more servings of fruits and  vegetables daily;Understanding recommendations for meals to include 15-35% energy as protein, 25-35% energy from fat, 35-60% energy from carbohydrates, less than 200mg  of dietary cholesterol, 20-35 gm of total fiber daily;Understanding of distribution of calorie intake throughout the day with the consumption of 4-5 meals/snacks;Short Term: Continue to assess and modify interventions until short term weight is achieved;Long Term: Adherence to nutrition and physical activity/exercise program aimed toward attainment of established weight goal    Improve shortness of breath with ADL's Yes    Intervention Provide education, individualized  exercise plan and daily activity instruction to help decrease symptoms of SOB with activities of daily living.    Expected Outcomes Short Term: Improve cardiorespiratory fitness to achieve a reduction of symptoms when performing ADLs;Long Term: Be able to perform more ADLs without symptoms or delay the onset of symptoms    Hypertension Yes    Intervention Provide education on lifestyle modifcations including regular physical activity/exercise, weight management, moderate sodium restriction and increased consumption of fresh fruit, vegetables, and low fat dairy, alcohol  moderation, and smoking cessation.;Monitor prescription use compliance.    Expected Outcomes Short Term: Continued assessment and intervention until BP is < 140/36mm HG in hypertensive participants. < 130/68mm HG in hypertensive participants with diabetes, heart failure or chronic kidney disease.;Long Term: Maintenance of blood pressure at goal levels.    Lipids Yes    Intervention Provide education and support for participant on nutrition & aerobic/resistive exercise along with prescribed medications to achieve LDL 70mg , HDL >40mg .    Expected Outcomes Short Term: Participant states understanding of desired cholesterol values and is compliant with medications prescribed. Participant is following exercise  prescription and nutrition guidelines.;Long Term: Cholesterol controlled with medications as prescribed, with individualized exercise RX and with personalized nutrition plan. Value goals: LDL < 70mg , HDL > 40 mg.          Education:Diabetes - Individual verbal and written instruction to review signs/symptoms of diabetes, desired ranges of glucose level fasting, after meals and with exercise. Acknowledge that pre and post exercise glucose checks will be done for 3 sessions at entry of program.   Know Your Numbers and Heart Failure: - Group verbal and visual instruction to discuss disease risk factors for cardiac and pulmonary disease and treatment options.  Reviews associated critical values for Overweight/Obesity, Hypertension, Cholesterol, and Diabetes.  Discusses basics of heart failure: signs/symptoms and treatments.  Introduces Heart Failure Zone chart for action plan for heart failure. Written material provided at class time.   Core Components/Risk Factors/Patient Goals Review:    Core Components/Risk Factors/Patient Goals at Discharge (Final Review):    ITP Comments:  ITP Comments     Row Name 05/18/24 1441 05/23/24 1208 05/25/24 1708 06/15/24 0936     ITP Comments Virtual Visit completed. Patient informed on EP and RD appointment and 6 Minute walk test. Patient also informed of patient health questionnaires on My Chart. Patient Verbalizes understanding. Visit diagnosis can be found in [facility] 01/21/2024. Completed and gym orientation for respiratory care services. Initial ITP created and sent for review to Dr. Faud Aleskerov, Medical Director. First full day of exercise!  Patient was oriented to gym and equipment including functions, settings, policies, and procedures.  Patient's individual exercise prescription and treatment plan were reviewed.  All starting workloads were established based on the results of the 6 minute walk test done at initial orientation visit.  The plan for  exercise progression was also introduced and progression will be customized based on patient's performance and goals. 30 Day review completed. Medical Director ITP review done; changes made as directed and signed approval by Medical Director. New Program.       Comments: 30 day review

## 2024-06-16 ENCOUNTER — Encounter

## 2024-06-22 ENCOUNTER — Encounter: Attending: Specialist | Admitting: Emergency Medicine

## 2024-06-22 DIAGNOSIS — J439 Emphysema, unspecified: Secondary | ICD-10-CM | POA: Insufficient documentation

## 2024-06-22 NOTE — Progress Notes (Signed)
 Daily Session Note  Patient Details  Name: Dillon [name] MRN: [id number] Date of Birth: 1956-06-04 Referring Provider:   Flowsheet Row Pulmonary Rehab from 05/23/2024 in [facility] Cardiac and Pulmonary Rehab  Referring Provider Theotis Chancellor, MD    Encounter Date: 06/22/2024  Check In:  Session Check In - 06/22/24 1539       Check-In   Supervising physician immediately available to respond to emergencies See telemetry face sheet for immediately available ER MD    Location [facility]-Cardiac & Pulmonary Rehab    Staff Present Maxon Conetta BS, Exercise Physiologist;Arayna Illescas RN,BSN;Joseph Rolinda RCP,RRT,BSRT;Meredith Tressa RN,BSN    Virtual Visit No    Medication changes reported     No    Fall or balance concerns reported    No    Warm-up and Cool-down Performed on first and last piece of equipment    Resistance Training Performed Yes    VAD Patient? No    PAD/SET Patient? No      Pain Assessment   Currently in Pain? No/denies             Social History   Tobacco Use  Smoking Status Former   Current packs/day: 0.00   Average packs/day: 2.0 packs/day for 37.0 years (74.0 ttl pk-yrs)   Types: Cigarettes   Start date: 34   Quit date: 2010   Years since quitting: 15.6  Smokeless Tobacco Never    Goals Met:  Proper associated with RPD/PD & O2 Sat Independence with exercise equipment Using PLB without cueing & demonstrates good technique Exercise tolerated well No report of concerns or symptoms today Strength training completed today  Goals Unmet:  Not Applicable  Comments: Pt able to follow exercise prescription today without complaint.  Will continue to monitor for progression.    Dr. Oneil Pinal is Medical Director for [facility] Cardiac Rehabilitation.  Dr. Fuad Aleskerov is Medical Director for [facility] Pulmonary Rehabilitation.

## 2024-06-23 ENCOUNTER — Encounter: Admitting: Emergency Medicine

## 2024-06-23 DIAGNOSIS — J439 Emphysema, unspecified: Secondary | ICD-10-CM

## 2024-06-23 NOTE — Progress Notes (Signed)
 Daily Session Note  Patient Details  Name: Breken Nazari MRN: [id number] Date of Birth: 26-Jun-1956 Referring Provider:   Flowsheet Row Pulmonary Rehab from 05/23/2024 in [facility] Cardiac and Pulmonary Rehab  Referring Provider Theotis Chancellor, MD    Encounter Date: 06/23/2024  Check In:  Session Check In - 06/23/24 1542       Check-In   Supervising physician immediately available to respond to emergencies See telemetry face sheet for immediately available ER MD    Location [facility]-Cardiac & Pulmonary Rehab    Staff Present Rollene Paterson, MS, Exercise Physiologist;Noah Tickle, BS, Exercise Physiologist;Chares Slaymaker RN,BSN;Joseph Rolinda RCP,RRT,BSRT    Virtual Visit No    Medication changes reported     No    Fall or balance concerns reported    No    Warm-up and Cool-down Performed on first and last piece of equipment    Resistance Training Performed Yes    VAD Patient? No    PAD/SET Patient? No      Pain Assessment   Currently in Pain? No/denies             Social History   Tobacco Use  Smoking Status Former   Current packs/day: 0.00   Average packs/day: 2.0 packs/day for 37.0 years (74.0 ttl pk-yrs)   Types: Cigarettes   Start date: 69   Quit date: 2010   Years since quitting: 15.6  Smokeless Tobacco Never    Goals Met:  Proper associated with RPD/PD & O2 Sat Independence with exercise equipment Using PLB without cueing & demonstrates good technique Exercise tolerated well No report of concerns or symptoms today Strength training completed today  Goals Unmet:  Not Applicable  Comments: Pt able to follow exercise prescription today without complaint.  Will continue to monitor for progression.    Dr. Oneil Pinal is Medical Director for [facility] Cardiac Rehabilitation.  Dr. Fuad Aleskerov is Medical Director for [facility] Pulmonary Rehabilitation.

## 2024-06-27 ENCOUNTER — Encounter: Admitting: Emergency Medicine

## 2024-06-27 ENCOUNTER — Encounter

## 2024-06-27 DIAGNOSIS — J439 Emphysema, unspecified: Secondary | ICD-10-CM

## 2024-06-27 NOTE — Progress Notes (Signed)
 Daily Session Note  Patient Details  Name: Dillon [name] MRN: [id number] Date of Birth: 1956/03/13 Referring Provider:   Flowsheet Row Pulmonary Rehab from 05/23/2024 in [facility] Cardiac and Pulmonary Rehab  Referring Provider Theotis Chancellor, MD    Encounter Date: 06/27/2024  Check In:  Session Check In - 06/27/24 1531       Check-In   Supervising physician immediately available to respond to emergencies See telemetry face sheet for immediately available ER MD    Location [facility]-Cardiac & Pulmonary Rehab    Staff Present Rollene Paterson, MS, Exercise Physiologist;Maxon Conetta BS, Exercise Physiologist;Nejla Reasor RN,BSN;Joseph Rolinda RCP,RRT,BSRT    Virtual Visit No    Medication changes reported     No    Fall or balance concerns reported    No    Warm-up and Cool-down Performed on first and last piece of equipment    Resistance Training Performed Yes    VAD Patient? No    PAD/SET Patient? No      Pain Assessment   Currently in Pain? No/denies             Social History   Tobacco Use  Smoking Status Former   Current packs/day: 0.00   Average packs/day: 2.0 packs/day for 37.0 years (74.0 ttl pk-yrs)   Types: Cigarettes   Start date: 27   Quit date: 2010   Years since quitting: 15.6  Smokeless Tobacco Never    Goals Met:  Proper associated with RPD/PD & O2 Sat Independence with exercise equipment Using PLB without cueing & demonstrates good technique Exercise tolerated well No report of concerns or symptoms today Strength training completed today  Goals Unmet:  Not Applicable  Comments: Pt able to follow exercise prescription today without complaint.  Will continue to monitor for progression.    Dr. Oneil Pinal is Medical Director for [facility] Cardiac Rehabilitation.  Dr. Fuad Aleskerov is Medical Director for [facility] Pulmonary Rehabilitation.

## 2024-06-29 ENCOUNTER — Encounter

## 2024-06-30 ENCOUNTER — Encounter: Admitting: Emergency Medicine

## 2024-06-30 DIAGNOSIS — J439 Emphysema, unspecified: Secondary | ICD-10-CM

## 2024-06-30 NOTE — Progress Notes (Signed)
 Daily Session Note  Patient Details  Name: Dillon [name] MRN: [id number] Date of Birth: Mar 09, 1956 Referring Provider:   Flowsheet Row Pulmonary Rehab from 05/23/2024 in [facility] Cardiac and Pulmonary Rehab  Referring Provider Theotis Chancellor, MD    Encounter Date: 06/30/2024  Check In:  Session Check In - 06/30/24 1534       Check-In   Supervising physician immediately available to respond to emergencies See telemetry face sheet for immediately available ER MD    Location [facility]-Cardiac & Pulmonary Rehab    Staff Present Rollene Paterson, MS, Exercise Physiologist;Noah Tickle, BS, Exercise Physiologist;Joseph Rolinda RCP,RRT,BSRT;Fredericka Bottcher RN,BSN    Virtual Visit No    Medication changes reported     No    Fall or balance concerns reported    No    Warm-up and Cool-down Performed on first and last piece of equipment    Resistance Training Performed Yes    VAD Patient? No    PAD/SET Patient? No      Pain Assessment   Currently in Pain? No/denies             Social History   Tobacco Use  Smoking Status Former   Current packs/day: 0.00   Average packs/day: 2.0 packs/day for 37.0 years (74.0 ttl pk-yrs)   Types: Cigarettes   Start date: 59   Quit date: 2010   Years since quitting: 15.7  Smokeless Tobacco Never    Goals Met:  Proper associated with RPD/PD & O2 Sat Independence with exercise equipment Using PLB without cueing & demonstrates good technique Exercise tolerated well No report of concerns or symptoms today Strength training completed today  Goals Unmet:  Not Applicable  Comments: Pt able to follow exercise prescription today without complaint.  Will continue to monitor for progression.    Dr. Oneil Pinal is Medical Director for [facility] Cardiac Rehabilitation.  Dr. Fuad Aleskerov is Medical Director for [facility] Pulmonary Rehabilitation.

## 2024-07-04 ENCOUNTER — Encounter: Admitting: Emergency Medicine

## 2024-07-04 DIAGNOSIS — J439 Emphysema, unspecified: Secondary | ICD-10-CM

## 2024-07-04 NOTE — Progress Notes (Signed)
 Daily Session Note  Patient Details  Name: Gabor Lusk MRN: [id number] Date of Birth: 23-Sep-1956 Referring Provider:   Flowsheet Row Pulmonary Rehab from 05/23/2024 in [facility]  Referring Provider Theotis Chancellor, MD    Encounter Date: 07/04/2024  Check In:  Session Check In - 07/04/24 1544       Check-In   Supervising physician immediately available to respond to emergencies See telemetry face sheet for immediately available ER MD    Location [facility]-Card[facility] & Pul[facility]    Staff Present Rollene Paterson, MS, Exercise Physiologist;Saurabh Hettich Vita RN,BSN;Joseph Rolinda RCP,RRT,BSRT;Meredith Tressa RN,BSN    Virtual Visit No    Medication changes reported     No    Fall or balance concerns reported    No    Warm-up and Cool-down Performed on first and last piece of equipment    Resistance Training Performed Yes    VAD Patient? No    PAD/SET Patient? No      Pain Assessment   Currently in Pain? No/denies             Social History   Tobacco Use  Smoking Status Former   Current packs/day: 0.00   Average packs/day: 2.0 packs/day for 37.0 years (74.0 ttl pk-yrs)   Types: Cigarettes   Start date: 53   Quit date: 2010   Years since quitting: 15.7  Smokeless Tobacco Never    Goals Met:  Proper associated with RPD/PD & O2 Sat Independence with exercise equipment Using PLB without cueing & demonstrates good technique Exercise tolerated well No report of concerns or symptoms today Strength training completed today  Goals Unmet:  Not Applicable  Comments: Pt able to follow exercise prescription today without complaint.  Will continue to monitor for progression.    Dr. Oneil Pinal is Medical Director for [facility].  Dr. Fuad Aleskerov is Medical Director for [facility].

## 2024-07-06 ENCOUNTER — Encounter

## 2024-07-06 DIAGNOSIS — J439 Emphysema, unspecified: Secondary | ICD-10-CM

## 2024-07-06 NOTE — Progress Notes (Signed)
 Pulmonary Individual Treatment Plan  Patient Details  Name: Dillon [name] MRN: [id number] Date of Birth: 1956/03/13 Referring Provider:   Flowsheet Row Pulmonary Rehab from 05/23/2024 in [facility] Cardiac and Pulmonary Rehab  Referring Provider Theotis Chancellor, MD    Initial Encounter Date:  Flowsheet Row Pulmonary Rehab from 05/23/2024 in [facility] Cardiac and Pulmonary Rehab  Date 05/23/24    Visit Diagnosis: Pulmonary emphysema, unspecified emphysema type (HCC)  Patient's Home Medications on Admission:  Current Outpatient Medications:    acetaminophen  (TYLENOL ) 500 MG tablet, Take 1,000 mg by mouth every 8 (eight) hours as needed for moderate pain., Disp: , Rfl:    albuterol  (PROVENTIL ) (2.5 MG/3ML) 0.083% nebulizer solution, SMARTSIG:3 Milliliter(s) Via Nebulizer Every 6 Hours PRN, Disp: , Rfl:    ALPHA LIPOIC ACID PO, Take 500-1,000 mg by mouth daily., Disp: , Rfl:    atorvastatin  (LIPITOR ) 40 MG tablet, Take 1 tablet (40 mg total) by mouth daily., Disp: 90 tablet, Rfl: 3   carvedilol  (COREG ) 6.25 MG tablet, Take 1 tablet (6.25 mg total) by mouth 2 (two) times daily with a meal., Disp: 180 tablet, Rfl: 3   clopidogrel  (PLAVIX ) 75 MG tablet, Take by mouth., Disp: , Rfl:    enoxaparin  (LOVENOX ) 40 MG/0.4ML injection, Inject 0.4 mLs (40 mg total) into the skin daily. (Patient not taking: Reported on 05/18/2024), Disp: , Rfl:    fluconazole (DIFLUCAN) 200 MG tablet, Take 200 mg by mouth once a week., Disp: , Rfl:    Fluticasone -Umeclidin-Vilant (TRELEGY ELLIPTA) 100-62.5-25 MCG/ACT AEPB, Inhale 1 puff into the lungs daily., Disp: 60 each, Rfl: 1   furosemide  (LASIX ) 20 MG tablet, Take 20 mg by mouth daily. (Patient not taking: Reported on 05/18/2024), Disp: , Rfl:    nitroGLYCERIN  (NITROSTAT ) 0.4 MG SL tablet, Place 1 tablet (0.4 mg total) under the tongue every 5 (five) minutes x 3 doses as needed for chest pain., Disp: 25 tablet, Rfl: 3   OXYGEN , Inhale 4 L into the lungs continuous., Disp: , Rfl:     pantoprazole  (PROTONIX ) 40 MG tablet, Take 40 mg by mouth daily., Disp: , Rfl:    pantoprazole  (PROTONIX ) 40 MG tablet, Take 1 tablet by mouth daily., Disp: , Rfl:    Pirfenidone  (ESBRIET ) 267 MG CAPS, Take 3 capsules by mouth 3 (three) times daily., Disp: , Rfl:    Pirfenidone  801 MG TABS, Take by mouth., Disp: , Rfl:    potassium chloride (KLOR-CON M) 10 MEQ tablet, Take 10 mEq by mouth daily. (Patient not taking: Reported on 05/18/2024), Disp: , Rfl:    predniSONE  (DELTASONE ) 10 MG tablet, Take 10 mg by mouth daily., Disp: , Rfl:    predniSONE  (DELTASONE ) 20 MG tablet, Take 20 mg by mouth daily., Disp: , Rfl:    roflumilast  (DALIRESP ) 500 MCG TABS tablet, Take 1 tablet by mouth daily. Every other day, Disp: , Rfl:    Treprostinil  (TYVASO  STARTER KIT) 0.6 MG/ML SOLN, Inhale 18 mcg into the lungs 4 (four) times daily. (Patient not taking: Reported on 05/18/2024), Disp: , Rfl:   Past Medical History: Past Medical History:  Diagnosis Date   Chronic cough    COPD (chronic obstructive pulmonary disease) (HCC)    Coronary artery disease    Edentulous    Elevated cholesterol    GERD (gastroesophageal reflux disease)    Myocardial infarction (HCC)    Pulmonary fibrosis (HCC)    Restless leg syndrome    Supplemental oxygen  dependent     Tobacco Use: Social History  Tobacco Use  Smoking Status Former   Current packs/day: 0.00   Average packs/day: 2.0 packs/day for 37.0 years (74.0 ttl pk-yrs)   Types: Cigarettes   Start date: 28   Quit date: 2010   Years since quitting: 15.7  Smokeless Tobacco Never    Labs: Review Flowsheet  More data exists      Latest Ref Rng & Units 09/30/2021 10/01/2021 07/09/2023 11/16/2023 02/24/2024  Labs for ITP Cardiac and Pulmonary Rehab  Cholestrol 100 - 199 mg/dL - 881  857  - -  LDL (calc) 0 - 99 mg/dL - 64  70  - -  HDL-C >60 mg/dL - 45  52  - -  Trlycerides 0 - 149 mg/dL - 45  891  - -  Hemoglobin A1c 4.8 - 5.6 % 5.7  - - - -  Bicarbonate  20.0 - 28.0 mmol/L - - - 29.2  24.4   Acid-base deficit 0.0 - 2.0 mmol/L - - - - 4.5   O2 Saturation % - - - 65  91      Pulmonary Assessment Scores:  Pulmonary Assessment Scores     Row Name 05/23/24 1320         ADL UCSD   ADL Phase Entry     SOB Score total 108     Rest 1     Walk 5     Stairs 5     Bath 5     Dress 4     Shop 5       CAT Score   CAT Score 31       mMRC Score   mMRC Score 3        UCSD: Self-administered rating of dyspnea associated with activities of daily living (ADLs) 6-point scale (0 = not at all to 5 = maximal or unable to do because of breathlessness)  Scoring Scores range from 0 to 120.  Minimally important difference is 5 units  CAT: CAT can identify the health impairment of COPD patients and is better correlated with disease progression.  CAT has a scoring range of zero to 40. The CAT score is classified into four groups of low (less than 10), medium (10 - 20), high (21-30) and very high (31-40) based on the impact level of disease on health status. A CAT score over 10 suggests significant symptoms.  A worsening CAT score could be explained by an exacerbation, poor medication adherence, poor inhaler technique, or progression of COPD or comorbid conditions.  CAT MCID is 2 points  mMRC: mMRC (Modified Medical Research Council) Dyspnea Scale is used to assess the degree of baseline functional disability in patients of respiratory disease due to dyspnea. No minimal important difference is established. A decrease in score of 1 point or greater is considered a positive change.   Pulmonary Function Assessment:  Pulmonary Function Assessment - 05/18/24 1434       Breath   Shortness of Breath Yes;Limiting activity;Fear of Shortness of Breath;Panic with Shortness of Breath          Exercise Target Goals: Exercise Program Goal: Individual exercise prescription set using results from initial 6 min walk test and THRR while considering   patient's activity barriers and safety.   Exercise Prescription Goal: Initial exercise prescription builds to 30-45 minutes a day of aerobic activity, 2-3 days per week.  Home exercise guidelines will be given to patient during program as part of exercise prescription that the participant will acknowledge.  Education:  Aerobic Exercise: - Group verbal and visual presentation on the components of exercise prescription. Introduces F.I.T.T principle from ACSM for exercise prescriptions.  Reviews F.I.T.T. principles of aerobic exercise including progression. Written material provided at class time.   Education: Resistance Exercise: - Group verbal and visual presentation on the components of exercise prescription. Introduces F.I.T.T principle from ACSM for exercise prescriptions  Reviews F.I.T.T. principles of resistance exercise including progression. Written material provided at class time.    Education: Exercise & Equipment Safety: - Individual verbal instruction and demonstration of equipment use and safety with use of the equipment. Flowsheet Row Pulmonary Rehab from 06/01/2024 in [facility] Cardiac and Pulmonary Rehab  Date 05/23/24  Educator MB  Instruction Review Code 1- Verbalizes Understanding    Education: Exercise Physiology & General Exercise Guidelines: - Group verbal and written instruction with models to review the exercise physiology of the cardiovascular system and associated critical values. Provides general exercise guidelines with specific guidelines to those with heart or lung disease.    Education: Flexibility, Balance, Mind/Body Relaxation: - Group verbal and visual presentation with interactive activity on the components of exercise prescription. Introduces F.I.T.T principle from ACSM for exercise prescriptions. Reviews F.I.T.T. principles of flexibility and balance exercise training including progression. Also discusses the mind body connection.  Reviews various relaxation  techniques to help reduce and manage stress (i.e. Deep breathing, progressive muscle relaxation, and visualization). Balance handout provided to take home. Written material provided at class time.   Activity Barriers & Risk Stratification:  Activity Barriers & Cardiac Risk Stratification - 05/23/24 1315       Activity Barriers & Cardiac Risk Stratification   Activity Barriers Shortness of Breath;Other (comment)    Comments neuropathy          6 Minute Walk:  6 Minute Walk     Row Name 05/23/24 1312         6 Minute Walk   Phase Initial     Distance 385 feet     Walk Time 3.13 minutes     # of Rest Breaks 2     MPH 1.4     METS 1.73     RPE 17     Perceived Dyspnea  4     VO2 Peak 6.05     Symptoms Yes (comment)     Comments SOB     Resting HR 67 bpm     Resting BP 120/60     Resting Oxygen  Saturation  90 %     Exercise Oxygen  Saturation  during 6 min walk 72 %     Max Ex. HR 90 bpm     Max Ex. BP 122/60     2 Minute Post BP 128/64       Interval HR   1 Minute HR 68     2 Minute HR 75     3 Minute HR 90     4 Minute HR 78     5 Minute HR 75     6 Minute HR 83     2 Minute Post HR 76     Interval Heart Rate? Yes       Interval Oxygen    Interval Oxygen ? Yes     Baseline Oxygen  Saturation % 90 %     1 Minute Oxygen  Saturation % 89 %     1 Minute Liters of Oxygen  10 L     2 Minute Oxygen  Saturation % 72 %     2 Minute  Liters of Oxygen  15 L     3 Minute Oxygen  Saturation % 78 %     3 Minute Liters of Oxygen  15 L     4 Minute Oxygen  Saturation % 80 %     4 Minute Liters of Oxygen  15 L     5 Minute Oxygen  Saturation % 84 %     5 Minute Liters of Oxygen  15 L     6 Minute Oxygen  Saturation % 74 %     6 Minute Liters of Oxygen  15 L     2 Minute Post Oxygen  Saturation % 86 %     2 Minute Post Liters of Oxygen  10 L       Oxygen  Initial Assessment:  Oxygen  Initial Assessment - 05/18/24 1432       Home Oxygen    Home Oxygen  Device Home Concentrator;Portable  Concentrator;E-Tanks    Sleep Oxygen  Prescription Continuous    Liters per minute 6    Home Exercise Oxygen  Prescription Continuous    Liters per minute 6    Home Resting Oxygen  Prescription Continuous    Liters per minute 6    Compliance with Home Oxygen  Use Yes      Initial 6 min Walk   Oxygen  Used Continuous    Liters per minute 6      Program Oxygen  Prescription   Program Oxygen  Prescription Continuous    Liters per minute 6      Intervention   Short Term Goals To learn and exhibit compliance with exercise, home and travel O2 prescription;To learn and understand importance of monitoring SPO2 with pulse oximeter and demonstrate accurate use of the pulse oximeter.;To learn and demonstrate proper pursed lip breathing techniques or other breathing techniques. ;To learn and understand importance of maintaining oxygen  saturations>88%;To learn and demonstrate proper use of respiratory medications    Long  Term Goals Exhibits compliance with exercise, home  and travel O2 prescription;Maintenance of O2 saturations>88%;Compliance with respiratory medication;Demonstrates proper use of MDI's;Exhibits proper breathing techniques, such as pursed lip breathing or other method taught during program session;Verbalizes importance of monitoring SPO2 with pulse oximeter and return demonstration          Oxygen  Re-Evaluation:  Oxygen  Re-Evaluation     Row Name 05/25/24 1709 06/22/24 1547           Program Oxygen  Prescription   Program Oxygen  Prescription Continuous Continuous;E-Tanks      Liters per minute 6 15      Comments -- Increase as needed.        Home Oxygen    Home Oxygen  Device Home Concentrator;Portable Concentrator;E-Tanks Home Concentrator;Portable Concentrator;E-Tanks      Sleep Oxygen  Prescription Continuous Continuous      Liters per minute 6 8      Home Exercise Oxygen  Prescription Continuous Continuous      Liters per minute 6 10      Home Resting Oxygen  Prescription  Continuous Continuous      Liters per minute 6 10      Compliance with Home Oxygen  Use Yes Yes        Goals/Expected Outcomes   Short Term Goals To learn and exhibit compliance with exercise, home and travel O2 prescription;To learn and understand importance of monitoring SPO2 with pulse oximeter and demonstrate accurate use of the pulse oximeter.;To learn and demonstrate proper pursed lip breathing techniques or other breathing techniques. ;To learn and understand importance of maintaining oxygen  saturations>88%;To learn and demonstrate proper use of respiratory medications To  learn and demonstrate proper pursed lip breathing techniques or other breathing techniques.       Long  Term Goals Exhibits compliance with exercise, home  and travel O2 prescription;Maintenance of O2 saturations>88%;Compliance with respiratory medication;Demonstrates proper use of MDI's;Exhibits proper breathing techniques, such as pursed lip breathing or other method taught during program session;Verbalizes importance of monitoring SPO2 with pulse oximeter and return demonstration Exhibits proper breathing techniques, such as pursed lip breathing or other method taught during program session      Comments Reviewed PLB technique with pt.  Talked about how it works and it's importance in maintaining their exercise saturations. Informed patient how to perform the Pursed Lipped breathing technique. Told patient to Inhale through the nose and out the mouth with pursed lips to keep their airways open, help oxygenate them better, practice when at rest or doing strenuous activity. Patient Verbalizes understanding of technique and will work on and be reiterated during [facility].      Goals/Expected Outcomes Short: Become more profiecient at using PLB. Long: Become independent at using PLB. Short: use PLB with exertion. Long: use PLB on exertion proficiently and independently.         Oxygen  Discharge (Final Oxygen  Re-Evaluation):   Oxygen  Re-Evaluation - 06/22/24 1547       Program Oxygen  Prescription   Program Oxygen  Prescription Continuous;E-Tanks    Liters per minute 15    Comments Increase as needed.      Home Oxygen    Home Oxygen  Device Home Concentrator;Portable Concentrator;E-Tanks    Sleep Oxygen  Prescription Continuous    Liters per minute 8    Home Exercise Oxygen  Prescription Continuous    Liters per minute 10    Home Resting Oxygen  Prescription Continuous    Liters per minute 10    Compliance with Home Oxygen  Use Yes      Goals/Expected Outcomes   Short Term Goals To learn and demonstrate proper pursed lip breathing techniques or other breathing techniques.     Long  Term Goals Exhibits proper breathing techniques, such as pursed lip breathing or other method taught during program session    Comments Informed patient how to perform the Pursed Lipped breathing technique. Told patient to Inhale through the nose and out the mouth with pursed lips to keep their airways open, help oxygenate them better, practice when at rest or doing strenuous activity. Patient Verbalizes understanding of technique and will work on and be reiterated during [facility].    Goals/Expected Outcomes Short: use PLB with exertion. Long: use PLB on exertion proficiently and independently.          Initial Exercise Prescription:  Initial Exercise Prescription - 05/23/24 1300       Date of Initial Exercise RX and Referring Provider   Date 05/23/24    Referring Provider Theotis Chancellor, MD      Oxygen    Oxygen  Continuous    Liters 6-15    Maintain Oxygen  Saturation 88% or higher      Treadmill   MPH 1   Try   Grade 0    Minutes 15    METs 1.73      Recumbant Bike   Level 1    RPM 50    Watts 15    Minutes 15    METs 1.73      NuStep   Level 1    SPM 80    Minutes 15    METs 1.73      Biostep-RELP   Level  1    SPM 50    Minutes 15    METs 1.73      Track   Laps 9    Minutes 15    METs 1.49       Prescription Details   Frequency (times per week) 3    Duration Progress to 30 minutes of continuous aerobic without signs/symptoms of physical distress      Intensity   THRR 40-80% of Max Heartrate 101-135    Ratings of Perceived Exertion 11-13    Perceived Dyspnea 0-4      Progression   Progression Continue to progress workloads to maintain intensity without signs/symptoms of physical distress.      Resistance Training   Training Prescription Yes    Weight 5 lb    Reps 10-15          Perform Capillary Blood Glucose checks as needed.  Exercise Prescription Changes:   Exercise Prescription Changes     Row Name 05/23/24 1300 06/01/24 1100 06/15/24 1300 06/29/24 1400       Response to Exercise   Blood Pressure (Admit) 120/60 110/58 102/62 118/62    Blood Pressure (Exercise) 122/60 118/60 124/68 122/60    Blood Pressure (Exit) 128/64 118/64 106/62 120/70    Heart Rate (Admit) 67 bpm 76 bpm 75 bpm 75 bpm    Heart Rate (Exercise) 90 bpm 87 bpm 89 bpm 83 bpm    Heart Rate (Exit) 67 bpm 75 bpm 70 bpm 81 bpm    Oxygen  Saturation (Admit) 90 % 88 % 90 % 90 %    Oxygen  Saturation (Exercise) 72 % 80 % 77 % 80 %    Oxygen  Saturation (Exit) 89 % 90 % 88 % 94 %    Rating of Perceived Exertion (Exercise) 17 11 11 11     Perceived Dyspnea (Exercise) 4 2 4 2     Symptoms SOB -- -- none    Comments results 1st day -- --    Duration -- Progress to 30 minutes of  aerobic without signs/symptoms of physical distress Progress to 30 minutes of  aerobic without signs/symptoms of physical distress Progress to 30 minutes of  aerobic without signs/symptoms of physical distress    Intensity -- THRR unchanged THRR unchanged THRR unchanged      Progression   Progression -- Continue to progress workloads to maintain intensity without signs/symptoms of physical distress. Continue to progress workloads to maintain intensity without signs/symptoms of physical distress. Continue to progress workloads  to maintain intensity without signs/symptoms of physical distress.    Average METs 1.73 1.55 1.78 1.37      Resistance Training   Training Prescription -- Yes Yes Yes    Weight -- 5 lb 5 lb 5 lb    Reps -- 10-15 10-15 10-15      Interval Training   Interval Training -- No No No      Oxygen    Oxygen  -- Continuous Continuous Continuous    Liters -- 6 10 15       NuStep   Level -- 1 4 3     Minutes -- 30 30 30     METs -- 1.6 2 1.6      Arm Ergometer   Level -- -- -- 1    Minutes -- -- -- 15    METs -- -- -- 1      Oxygen    Maintain Oxygen  Saturation -- 88% or higher 88% or higher 88% or higher  Exercise Comments:   Exercise Comments     Row Name 05/25/24 1708           Exercise Comments First full day of exercise!  Patient was oriented to gym and equipment including functions, settings, policies, and procedures.  Patient's individual exercise prescription and treatment plan were reviewed.  All starting workloads were established based on the results of the 6 minute walk test done at initial orientation visit.  The plan for exercise progression was also introduced and progression will be customized based on patient's performance and goals.          Exercise Goals and Review:   Exercise Goals     Row Name 05/23/24 1319             Exercise Goals   Increase Physical Activity Yes       Intervention Provide advice, education, support and counseling about physical activity/exercise needs.;Develop an individualized exercise prescription for aerobic and resistive training based on initial evaluation findings, risk stratification, comorbidities and participant's personal goals.       Expected Outcomes Short Term: Attend rehab on a regular basis to increase amount of physical activity.;Long Term: Add in home exercise to make exercise part of routine and to increase amount of physical activity.;Long Term: Exercising regularly at least 3-5 days a week.       Increase  Strength and Stamina Yes       Intervention Provide advice, education, support and counseling about physical activity/exercise needs.;Develop an individualized exercise prescription for aerobic and resistive training based on initial evaluation findings, risk stratification, comorbidities and participant's personal goals.       Expected Outcomes Short Term: Increase workloads from initial exercise prescription for resistance, speed, and METs.;Short Term: Perform resistance training exercises routinely during rehab and add in resistance training at home;Long Term: Improve cardiorespiratory fitness, muscular endurance and strength as measured by increased METs and functional capacity ( )       Able to understand and use rate of perceived exertion (RPE) scale Yes       Intervention Provide education and explanation on how to use RPE scale       Expected Outcomes Short Term: Able to use RPE daily in rehab to express subjective intensity level;Long Term:  Able to use RPE to guide intensity level when exercising independently       Able to understand and use Dyspnea scale Yes       Intervention Provide education and explanation on how to use Dyspnea scale       Expected Outcomes Short Term: Able to use Dyspnea scale daily in rehab to express subjective sense of shortness of breath during exertion;Long Term: Able to use Dyspnea scale to guide intensity level when exercising independently       Knowledge and understanding of Target Heart Rate Range (THRR) Yes       Intervention Provide education and explanation of THRR including how the numbers were predicted and where they are located for reference       Expected Outcomes Short Term: Able to state/look up THRR;Short Term: Able to use daily as guideline for intensity in rehab;Long Term: Able to use THRR to govern intensity when exercising independently       Able to check pulse independently Yes       Intervention Provide education and demonstration on how  to check pulse in carotid and radial arteries.;Review the importance of being able to check your own pulse for safety during independent  exercise       Expected Outcomes Short Term: Able to explain why pulse checking is important during independent exercise;Long Term: Able to check pulse independently and accurately       Understanding of Exercise Prescription Yes       Intervention Provide education, explanation, and written materials on patient's individual exercise prescription       Expected Outcomes Short Term: Able to explain program exercise prescription;Long Term: Able to explain home exercise prescription to exercise independently          Exercise Goals Re-Evaluation :  Exercise Goals Re-Evaluation     Row Name 05/25/24 1709 06/01/24 1122 06/15/24 1339 06/29/24 1436       Exercise Goal Re-Evaluation   Exercise Goals Review Increase Physical Activity;Increase Strength and Stamina;Able to understand and use rate of perceived exertion (RPE) scale;Able to understand and use Dyspnea scale;Knowledge and understanding of Target Heart Rate Range (THRR);Able to check pulse independently;Understanding of Exercise Prescription Increase Physical Activity;Understanding of Exercise Prescription;Increase Strength and Stamina Increase Physical Activity;Understanding of Exercise Prescription;Increase Strength and Stamina Increase Physical Activity;Understanding of Exercise Prescription;Increase Strength and Stamina    Comments Reviewed RPE and dyspnea scale, THR and program prescription with pt today.  Pt voiced understanding and was given a copy of goals to take home. Dillon [name] is off to a good start in the program and completed his first day in this review. He used the T4 nustep at level 1 for 30 minutes. His SaO2 did drop down to 80 and increased back up to 88 with rest. We will continue to monitor his progress in the program. Dillon [name] is doing well in rehab. He was recently able to increase his level on the T4  nustep from level 2 to 4. He was also able to maintain 5lb handweights. We will continue to encourage and monitor his progress in the program. Dillon [name] is doing well in rehab. He has been able to maintain his workload on the T4 nustep at level 3, as well as level 1 on the arm ergometer. We will continue to monitor and encourage his progress in the program.    Expected Outcomes Short: Use RPE daily to regulate intensity. Long: Follow program prescription in THR. Short: Continue to follow current exercise prescription. Long: Continue exercise to improve strength and stamina. Short: Continue to follow current exercise prescription. Long: Continue exercise to improve strength and stamina. Short: Continue to follow current exercise prescription. Long: Continue exercise to improve strength and stamina.       Discharge Exercise Prescription (Final Exercise Prescription Changes):  Exercise Prescription Changes - 06/29/24 1400       Response to Exercise   Blood Pressure (Admit) 118/62    Blood Pressure (Exercise) 122/60    Blood Pressure (Exit) 120/70    Heart Rate (Admit) 75 bpm    Heart Rate (Exercise) 83 bpm    Heart Rate (Exit) 81 bpm    Oxygen  Saturation (Admit) 90 %    Oxygen  Saturation (Exercise) 80 %    Oxygen  Saturation (Exit) 94 %    Rating of Perceived Exertion (Exercise) 11    Perceived Dyspnea (Exercise) 2    Symptoms none    Duration Progress to 30 minutes of  aerobic without signs/symptoms of physical distress    Intensity THRR unchanged      Progression   Progression Continue to progress workloads to maintain intensity without signs/symptoms of physical distress.    Average METs 1.37      Resistance  Training   Training Prescription Yes    Weight 5 lb    Reps 10-15      Interval Training   Interval Training No      Oxygen    Oxygen  Continuous    Liters 15      NuStep   Level 3    Minutes 30    METs 1.6      Arm Ergometer   Level 1    Minutes 15    METs 1       Oxygen    Maintain Oxygen  Saturation 88% or higher          Nutrition:  Target Goals: Understanding of nutrition guidelines, daily intake of sodium 1500mg , cholesterol 200mg , calories 30% from fat and 7% or less from saturated fats, daily to have 5 or more servings of fruits and vegetables.  Education: Nutrition 1 -Group instruction provided by verbal, written material, interactive activities, discussions, models, and posters to present general guidelines for heart healthy nutrition including macronutrients, label reading, and promoting whole foods over processed counterparts. Education serves as Pensions consultant of discussion of heart healthy eating for all. Written material provided at class time.     Education: Nutrition 2 -Group instruction provided by verbal, written material, interactive activities, discussions, models, and posters to present general guidelines for heart healthy nutrition including sodium, cholesterol, and saturated fat. Providing guidance of habit forming to improve blood pressure, cholesterol, and body weight. Written material provided at class time.     Biometrics:  Pre Biometrics - 05/23/24 1319       Pre Biometrics   Height 6' 2.76 (1.899 m)    Weight 185 lb 3.2 oz (84 kg)    Waist Circumference 39.8 inches    Hip Circumference 39 inches    Waist to Hip Ratio 1.02 %    BMI (Calculated) 23.29    Single Leg Stand 3.7 seconds           Nutrition Therapy Plan and Nutrition Goals:  Nutrition Therapy & Goals - 05/23/24 1322       Nutrition Therapy   RD appointment deferred Yes      Personal Nutrition Goals   Nutrition Goal RD appointment deferred at this time      Intervention Plan   Intervention Prescribe, educate and counsel regarding individualized specific dietary modifications aiming towards targeted core components such as weight, hypertension, lipid management, diabetes, heart failure and other comorbidities.    Expected Outcomes Short Term  Goal: Understand basic principles of dietary content, such as calories, fat, sodium, cholesterol and nutrients.          Nutrition Assessments:  MEDIFICTS Score Key: >=70 Need to make dietary changes  40-70 Heart Healthy Diet <= 40 Therapeutic Level Cholesterol Diet  Flowsheet Row Pulmonary Rehab from 05/23/2024 in [facility] Card[facility] and Pulmonary Rehab  Picture Your Plate Total Score on Admission 46   Picture Your Plate Scores: <59 Unhealthy dietary pattern with much room for improvement. 41-50 Dietary pattern unlikely to meet recommendations for good health and room for improvement. 51-60 More healthful dietary pattern, with some room for improvement.  >60 Healthy dietary pattern, although there may be some specific behaviors that could be improved.   Nutrition Goals Re-Evaluation:  Nutrition Goals Re-Evaluation     Row Name 06/22/24 1546             Goals   Comment Patient was informed on why it is important to maintain a balanced diet when dealing with Respiratory  issues. Explained that it takes a lot of energy to breath and when they are short of breath often they will need to have a good diet to help keep up with the calories they are expending for breathing.       Expected Outcome Short: Choose and plan snacks accordingly to patients caloric intake to improve breathing. Long: Maintain a diet independently that meets their caloric intake to aid in daily shortness of breath.          Nutrition Goals Discharge (Final Nutrition Goals Re-Evaluation):  Nutrition Goals Re-Evaluation - 06/22/24 1546       Goals   Comment Patient was informed on why it is important to maintain a balanced diet when dealing with Respiratory issues. Explained that it takes a lot of energy to breath and when they are short of breath often they will need to have a good diet to help keep up with the calories they are expending for breathing.    Expected Outcome Short: Choose and plan snacks accordingly to  patients caloric intake to improve breathing. Long: Maintain a diet independently that meets their caloric intake to aid in daily shortness of breath.          Psychosocial: Target Goals: Acknowledge presence or absence of significant depression and/or stress, maximize coping skills, provide positive support system. Participant is able to verbalize types and ability to use techniques and skills needed for reducing stress and depression.   Education: Stress, Anxiety, and Depression - Group verbal and visual presentation to define topics covered.  Reviews how body is impacted by stress, anxiety, and depression.  Also discusses healthy ways to reduce stress and to treat/manage anxiety and depression.  Written material provided at class time.   Education: Sleep Hygiene -Provides group verbal and written instruction about how sleep can affect your health.  Define sleep hygiene, discuss sleep cycles and impact of sleep habits. Review good sleep hygiene tips.    Initial Review & Psychosocial Screening:  Initial Psych Review & Screening - 05/18/24 1437       Initial Review   Current issues with Current Stress Concerns    Source of Stress Concerns Chronic Illness      Family Dynamics   Good Support System? Yes    Comments He can look to his wife, daughters and son. He is wanting to get on the transplant list for his lungs. He takes no medication for his mood.      Barriers   Psychosocial barriers to participate in program The patient should benefit from training in stress management and relaxation.;There are no identifiable barriers or psychosocial needs.      Screening Interventions   Interventions Provide feedback about the scores to participant;To provide support and resources with identified psychosocial needs;Encouraged to exercise    Expected Outcomes Short Term goal: Utilizing psychosocial counselor, staff and physician to assist with identification of specific Stressors or current  issues interfering with healing process. Setting desired goal for each stressor or current issue identified.;Long Term Goal: Stressors or current issues are controlled or eliminated.;Short Term goal: Identification and review with participant of any Quality of Life or Depression concerns found by scoring the questionnaire.;Long Term goal: The participant improves quality of Life and PHQ9 Scores as seen by post scores and/or verbalization of changes          Quality of Life Scores:  Scores of 19 and below usually indicate a poorer quality of life in these areas.  A difference  of  2-3 points is a clinically meaningful difference.  A difference of 2-3 points in the total score of the Quality of Life Index has been associated with significant improvement in overall quality of life, self-image, physical symptoms, and general health in studies assessing change in quality of life.  PHQ-9: Review Flowsheet       05/23/2024 12/23/2021 08/12/2021 09/07/2020  Depression screen PHQ 2/9  Decreased Interest 0 0 0 0  Down, Depressed, Hopeless 0 0 0 0  PHQ - 2 Score 0 0 0 0  Altered sleeping 1 - - -  Tired, decreased energy 1 - - -  Change in appetite 0 - - -  Feeling bad or failure about yourself  0 - - -  Trouble concentrating 1 - - -  Moving slowly or fidgety/restless 1 - - -  Suicidal thoughts 0 - - -  PHQ-9 Score 4 - - -  Difficult doing work/chores Somewhat difficult - - -   Interpretation of Total Score  Total Score Depression Severity:  1-4 = Minimal depression, 5-9 = Mild depression, 10-14 = Moderate depression, 15-19 = Moderately severe depression, 20-27 = Severe depression   Psychosocial Evaluation and Intervention:  Psychosocial Evaluation - 05/18/24 1438       Psychosocial Evaluation & Interventions   Interventions Stress management education;Relaxation education;Encouraged to exercise with the program and follow exercise prescription    Comments He can look to his wife, daughters and  son. He is wanting to get on the transplant list for his lungs. He takes no medication for his mood.    Expected Outcomes Short: Start [facility] to help with mood. Long: Maintain a healthy mental state.    Continue Psychosocial Services  Follow up required by staff          Psychosocial Re-Evaluation:  Psychosocial Re-Evaluation     Row Name 06/22/24 1557             Psychosocial Re-Evaluation   Current issues with Current Stress Concerns       Comments Patient reports no issues with their current mental states, sleep, stress, depression or anxiety. Will follow up with patient in a few weeks for any changes.       Expected Outcomes Short: Continue to exercise regularly to support mental health and notify staff of any changes. Long: maintain mental health and well being through teaching of rehab or prescribed medications independently.       Interventions Encouraged to attend Pulmonary Rehabilitation for the exercise       Continue Psychosocial Services  Follow up required by staff          Psychosocial Discharge (Final Psychosocial Re-Evaluation):  Psychosocial Re-Evaluation - 06/22/24 1557       Psychosocial Re-Evaluation   Current issues with Current Stress Concerns    Comments Patient reports no issues with their current mental states, sleep, stress, depression or anxiety. Will follow up with patient in a few weeks for any changes.    Expected Outcomes Short: Continue to exercise regularly to support mental health and notify staff of any changes. Long: maintain mental health and well being through teaching of rehab or prescribed medications independently.    Interventions Encouraged to attend Pulmonary Rehabilitation for the exercise    Continue Psychosocial Services  Follow up required by staff          Education: Education Goals: Education classes will be provided on a weekly basis, covering required topics. Participant will state understanding/return demonstration  of  topics presented.  Learning Barriers/Preferences:  Learning Barriers/Preferences - 05/18/24 1435       Learning Barriers/Preferences   Learning Barriers None    Learning Preferences None          General Pulmonary Education Topics:  Infection Prevention: - Provides verbal and written material to individual with discussion of infection control including proper hand washing and proper equipment cleaning during exercise session. Flowsheet Row Pulmonary Rehab from 06/01/2024 in [facility] Cardiac and Pulmonary Rehab  Date 05/23/24  Educator MB  Instruction Review Code 1- Verbalizes Understanding    Falls Prevention: - Provides verbal and written material to individual with discussion of falls prevention and safety. Flowsheet Row Pulmonary Rehab from 06/01/2024 in [facility] Cardiac and Pulmonary Rehab  Date 05/23/24  Educator MB  Instruction Review Code 1- Verbalizes Understanding    Chronic Lung Disease Review: - Group verbal instruction with posters, models, PowerPoint presentations and videos,  to review new updates, new respiratory medications, new advancements in procedures and treatments. Providing information on websites and 800 numbers for continued self-education. Includes information about supplement oxygen , available portable oxygen  systems, continuous and intermittent flow rates, oxygen  safety, concentrators, and Medicare reimbursement for oxygen . Explanation of Pulmonary Drugs, including class, frequency, complications, importance of spacers, rinsing mouth after steroid MDI's, and proper cleaning methods for nebulizers. Review of basic lung anatomy and physiology related to function, structure, and complications of lung disease. Review of risk factors. Discussion about methods for diagnosing sleep apnea and types of masks and machines for OSA. Includes a review of the use of types of environmental controls: home humidity, furnaces, filters, dust mite/pet prevention, HEPA vacuums.  Discussion about weather changes, air quality and the benefits of nasal washing. Instruction on Warning signs, infection symptoms, calling MD promptly, preventive modes, and value of vaccinations. Review of effective airway clearance, coughing and/or vibration techniques. Emphasizing that all should Create an Action Plan. Written material provided at class time. Flowsheet Row Pulmonary Rehab from 06/01/2024 in [facility] Cardiac and Pulmonary Rehab  Education need identified 05/23/24    AED/CPR: - Group verbal and written instruction with the use of models to demonstrate the basic use of the AED with the basic ABC's of resuscitation.    Tests and Procedures:  - Group verbal and visual presentation and models provide information about basic cardiac anatomy and function. Reviews the testing methods done to diagnose heart disease and the outcomes of the test results. Describes the treatment choices: Medical Management, Angioplasty, or Coronary Bypass Surgery for treating various heart conditions including Myocardial Infarction, Angina, Valve Disease, and Cardiac Arrhythmias.  Written material provided at class time.   Medication Safety: - Group verbal and visual instruction to review commonly prescribed medications for heart and lung disease. Reviews the medication, class of the drug, and side effects. Includes the steps to properly store meds and maintain the prescription regimen.  Written material given at graduation.   Other: -Provides group and verbal instruction on various topics (see comments)   Knowledge Questionnaire Score:  Knowledge Questionnaire Score - 05/23/24 1326       Knowledge Questionnaire Score   Pre Score 13/18           Core Components/Risk Factors/Patient Goals at Admission:  Personal Goals and Risk Factors at Admission - 05/23/24 1326       Core Components/Risk Factors/Patient Goals on Admission    Weight Management Yes;Weight Maintenance    Intervention Weight  Management: Develop a combined nutrition and exercise program designed to  reach desired caloric intake, while maintaining appropriate intake of nutrient and fiber, sodium and fats, and appropriate energy expenditure required for the weight goal.;Weight Management: Provide education and appropriate resources to help participant work on and attain dietary goals.;Weight Management/Obesity: Establish reasonable short term and long term weight goals.    Admit Weight 185 lb 3.2 oz (84 kg)    Goal Weight: Short Term 185 lb 3.2 oz (84 kg)    Goal Weight: Long Term 185 lb 3.2 oz (84 kg)    Expected Outcomes Weight Maintenance: Understanding of the daily nutrition guidelines, which includes 25-35% calories from fat, 7% or less cal from saturated fats, less than 200mg  cholesterol, less than 1.5gm of sodium, & 5 or more servings of fruits and vegetables daily;Understanding recommendations for meals to include 15-35% energy as protein, 25-35% energy from fat, 35-60% energy from carbohydrates, less than 200mg  of dietary cholesterol, 20-35 gm of total fiber daily;Understanding of distribution of calorie intake throughout the day with the consumption of 4-5 meals/snacks;Short Term: Continue to assess and modify interventions until short term weight is achieved;Long Term: Adherence to nutrition and physical activity/exercise program aimed toward attainment of established weight goal    Improve shortness of breath with ADL's Yes    Intervention Provide education, individualized exercise plan and daily activity instruction to help decrease symptoms of SOB with activities of daily living.    Expected Outcomes Short Term: Improve cardiorespiratory fitness to achieve a reduction of symptoms when performing ADLs;Long Term: Be able to perform more ADLs without symptoms or delay the onset of symptoms    Hypertension Yes    Intervention Provide education on lifestyle modifcations including regular physical activity/exercise, weight  management, moderate sodium restriction and increased consumption of fresh fruit, vegetables, and low fat dairy, alcohol  moderation, and smoking cessation.;Monitor prescription use compliance.    Expected Outcomes Short Term: Continued assessment and intervention until BP is < 140/68mm HG in hypertensive participants. < 130/68mm HG in hypertensive participants with diabetes, heart failure or chronic kidney disease.;Long Term: Maintenance of blood pressure at goal levels.    Lipids Yes    Intervention Provide education and support for participant on nutrition & aerobic/resistive exercise along with prescribed medications to achieve LDL 70mg , HDL >40mg .    Expected Outcomes Short Term: Participant states understanding of desired cholesterol values and is compliant with medications prescribed. Participant is following exercise prescription and nutrition guidelines.;Long Term: Cholesterol controlled with medications as prescribed, with individualized exercise RX and with personalized nutrition plan. Value goals: LDL < 70mg , HDL > 40 mg.          Education:Diabetes - Individual verbal and written instruction to review signs/symptoms of diabetes, desired ranges of glucose level fasting, after meals and with exercise. Acknowledge that pre and post exercise glucose checks will be done for 3 sessions at entry of program.   Know Your Numbers and Heart Failure: - Group verbal and visual instruction to discuss disease risk factors for cardiac and pulmonary disease and treatment options.  Reviews associated critical values for Overweight/Obesity, Hypertension, Cholesterol, and Diabetes.  Discusses basics of heart failure: signs/symptoms and treatments.  Introduces Heart Failure Zone chart for action plan for heart failure. Written material provided at class time.   Core Components/Risk Factors/Patient Goals Review:   Goals and Risk Factor Review     Row Name 06/22/24 1546             Core  Components/Risk Factors/Patient Goals Review   Personal Goals Review Improve shortness  of breath with ADL's       Review Spoke to patient about their shortness of breath and what they can do to improve. Patient has been informed of breathing techniques when starting the program. Patient is informed to tell staff if they have had any med changes and that certain meds they are taking or not taking can be causing shortness of breath.       Expected Outcomes Short: Attend [facility] regularly to improve shortness of breath with ADL's. Long: maintain independence with ADL's          Core Components/Risk Factors/Patient Goals at Discharge (Final Review):   Goals and Risk Factor Review - 06/22/24 1546       Core Components/Risk Factors/Patient Goals Review   Personal Goals Review Improve shortness of breath with ADL's    Review Spoke to patient about their shortness of breath and what they can do to improve. Patient has been informed of breathing techniques when starting the program. Patient is informed to tell staff if they have had any med changes and that certain meds they are taking or not taking can be causing shortness of breath.    Expected Outcomes Short: Attend [facility] regularly to improve shortness of breath with ADL's. Long: maintain independence with ADL's          ITP Comments:  ITP Comments     Row Name 05/18/24 1441 05/23/24 1208 05/25/24 1708 06/15/24 0936 06/22/24 1552   ITP Comments Virtual Visit completed. Patient informed on EP and RD appointment and 6 Minute walk test. Patient also informed of patient health questionnaires on My Chart. Patient Verbalizes understanding. Visit diagnosis can be found in [facility] 01/21/2024. Completed and gym orientation for respiratory care services. Initial ITP created and sent for review to Dr. Faud Aleskerov, Medical Director. First full day of exercise!  Patient was oriented to gym and equipment including functions, settings, policies, and  procedures.  Patient's individual exercise prescription and treatment plan were reviewed.  All starting workloads were established based on the results of the 6 minute walk test done at initial orientation visit.  The plan for exercise progression was also introduced and progression will be customized based on patient's performance and goals. 30 Day review completed. Medical Director ITP review done; changes made as directed and signed approval by Medical Director. New Program. Patient is working on getting a Lung Transplant. He has required more oxygen  since the start of the program. He is on 15 liters of oxygen  at the moment and is able to use PLB to keep his oxygen  above 88 percent. [facility] would like him to do 2 days a week of pulmoary rehab at there facility and one day a week at [facility].    Row Name 07/06/24 1343           ITP Comments Early discharge, Patient is recieving full time treatment at [facility]          Comments: Early Discharge

## 2024-07-06 NOTE — Progress Notes (Signed)
 Early Discharge Summary  Dillon [name] [birth year]  [name] is discharging early due to Switching to full time treatment at [facility]. He completed 9 of 36 sessions.    6 Minute Walk     Row Name 05/23/24 1312         6 Minute Walk   Phase Initial     Distance 385 feet     Walk Time 3.13 minutes     # of Rest Breaks 2     MPH 1.4     METS 1.73     RPE 17     Perceived Dyspnea  4     VO2 Peak 6.05     Symptoms Yes (comment)     Comments SOB     Resting HR 67 bpm     Resting BP 120/60     Resting Oxygen  Saturation  90 %     Exercise Oxygen  Saturation  during 6 min walk 72 %     Max Ex. HR 90 bpm     Max Ex. BP 122/60     2 Minute Post BP 128/64       Interval HR   1 Minute HR 68     2 Minute HR 75     3 Minute HR 90     4 Minute HR 78     5 Minute HR 75     6 Minute HR 83     2 Minute Post HR 76     Interval Heart Rate? Yes       Interval Oxygen    Interval Oxygen ? Yes     Baseline Oxygen  Saturation % 90 %     1 Minute Oxygen  Saturation % 89 %     1 Minute Liters of Oxygen  10 L     2 Minute Oxygen  Saturation % 72 %     2 Minute Liters of Oxygen  15 L     3 Minute Oxygen  Saturation % 78 %     3 Minute Liters of Oxygen  15 L     4 Minute Oxygen  Saturation % 80 %     4 Minute Liters of Oxygen  15 L     5 Minute Oxygen  Saturation % 84 %     5 Minute Liters of Oxygen  15 L     6 Minute Oxygen  Saturation % 74 %     6 Minute Liters of Oxygen  15 L     2 Minute Post Oxygen  Saturation % 86 %     2 Minute Post Liters of Oxygen  10 L

## 2024-07-07 ENCOUNTER — Encounter

## 2024-07-11 ENCOUNTER — Encounter

## 2024-07-13 ENCOUNTER — Encounter

## 2024-07-14 ENCOUNTER — Encounter

## 2024-07-18 ENCOUNTER — Encounter

## 2024-07-20 ENCOUNTER — Encounter

## 2024-07-21 ENCOUNTER — Encounter

## 2024-07-25 ENCOUNTER — Encounter

## 2024-07-27 ENCOUNTER — Encounter

## 2024-07-28 ENCOUNTER — Encounter

## 2024-08-01 ENCOUNTER — Encounter

## 2024-08-03 ENCOUNTER — Encounter

## 2024-08-04 ENCOUNTER — Encounter

## 2024-08-08 ENCOUNTER — Encounter

## 2024-08-10 ENCOUNTER — Encounter

## 2024-08-11 ENCOUNTER — Encounter

## 2024-08-15 ENCOUNTER — Encounter

## 2024-08-17 ENCOUNTER — Encounter

## 2024-08-18 ENCOUNTER — Encounter

## 2024-08-22 ENCOUNTER — Encounter

## 2024-10-01 ENCOUNTER — Other Ambulatory Visit: Payer: Self-pay | Admitting: Physician Assistant

## 2024-10-01 DIAGNOSIS — I2510 Atherosclerotic heart disease of native coronary artery without angina pectoris: Secondary | ICD-10-CM

## 2024-11-07 NOTE — Therapy (Signed)
 " OUTPATIENT PHYSICAL THERAPY NEURO EVALUATION   Patient Name: Nation Cradle MRN: [id number] DOB:November 01, 1955, 69 y.o., male Today's Date: 11/08/2024   PCP: debara REFERRING PROVIDER: Pearlene Riis   END OF SESSION:  PT End of Session - 11/08/24 1236     Visit Number 1    Number of Visits 24    Date for Recertification  01/31/25    PT Start Time 1145    PT Stop Time 1229    PT Time Calculation (min) 44 min    Equipment Utilized During Treatment Gait belt    Activity Tolerance Patient tolerated treatment well    Behavior During Therapy WFL for tasks assessed/performed          Past Medical History:  Diagnosis Date   Chronic cough    COPD (chronic obstructive pulmonary disease) (HCC)    Coronary artery disease    Edentulous    Elevated cholesterol    GERD (gastroesophageal reflux disease)    Myocardial infarction (HCC)    Pulmonary fibrosis (HCC)    Restless leg syndrome    Supplemental oxygen  dependent    Past Surgical History:  Procedure Laterality Date   ANGIOPLASTY  2010   2 stents placed   BACK SURGERY     cardiac stents     x2   CATARACT EXTRACTION W/PHACO Left 08/20/2023   Procedure: CATARACT EXTRACTION PHACO AND INTRAOCULAR LENS PLACEMENT (IOC) LEFT 16.87 01:21.8;  Surgeon: Enola Feliciano Hugger, MD;  Location: [facility];  Service: Ophthalmology;  Laterality: Left;   CATARACT EXTRACTION W/PHACO Right 09/03/2023   Procedure: CATARACT EXTRACTION PHACO AND INTRAOCULAR LENS PLACEMENT (IOC) RIGHT 19.41 01:30.0;  Surgeon: Enola Feliciano Hugger, MD;  Location: [facility];  Service: Ophthalmology;  Laterality: Right;   COLONOSCOPY     COLONOSCOPY WITH PROPOFOL  N/A 03/05/2018   Procedure: COLONOSCOPY WITH PROPOFOL ;  Surgeon: Janalyn Keene NOVAK, MD;  Location: [facility];  Service: Endoscopy;  Laterality: N/A;   COLONOSCOPY WITH PROPOFOL  N/A 01/24/2022   Procedure: COLONOSCOPY WITH PROPOFOL ;  Surgeon: Therisa Bi, MD;  Location: [facility]  ENDOSCOPY;  Service: Gastroenterology;  Laterality: N/A;   CORONARY ANGIOPLASTY     CORONARY/GRAFT ACUTE MI REVASCULARIZATION N/A 09/30/2019   Procedure: Coronary/Graft Acute MI Revascularization;  Surgeon: Darron Deatrice LABOR, MD;  Location: [facility];  Service: Cardiovascular;  Laterality: N/A;   LEFT HEART CATH AND CORONARY ANGIOGRAPHY N/A 09/30/2019   Procedure: LEFT HEART CATH AND CORONARY ANGIOGRAPHY;  Surgeon: Darron Deatrice LABOR, MD;  Location: [facility];  Service: Cardiovascular;  Laterality: N/A;   LEFT HEART CATH AND CORS/GRAFTS ANGIOGRAPHY N/A 10/01/2021   Procedure: LEFT HEART CATH AND CORS/GRAFTS ANGIOGRAPHY;  Surgeon: Mady Bruckner, MD;  Location: [facility];  Service: Cardiovascular;  Laterality: N/A;   Patient Active Problem List   Diagnosis Date Noted   Spontaneous pneumothorax 02/24/2024   Chronic heart failure with preserved ejection fraction (HFpEF) (HCC) 02/24/2024   COPD with acute exacerbation (HCC) 11/17/2023   Pulmonary hypertension (HCC) 11/17/2023   GERD without esophagitis 11/17/2023   Coronary artery disease 11/17/2023   Acute respiratory failure with hypoxia (HCC) 11/16/2023   Acute on chronic hypoxic respiratory failure (HCC) 10/24/2022   Difficult or painful urination 12/23/2021   Impaired fasting glucose    Non-ST elevation (NSTEMI) myocardial infarction (HCC) 09/30/2021   Chest pain 09/30/2021   Elevated glucose level 09/30/2021   Weight loss 06/12/2021   Abrasion of left arm 12/07/2020   Chronic obstructive pulmonary disease (  HCC) 12/07/2020   Essential hypertension 12/07/2020   Fever in adult 09/07/2020   Dyslipidemia 09/07/2020   Cellulitis of left buttock 06/11/2020   Pulmonary fibrosis (HCC) 05/11/2020   Coronary artery disease due to lipid rich plaque 05/11/2020   Dermatitis 05/11/2020   Disorder of left rotator cuff 03/09/2020   ED (erectile dysfunction) of non-organic origin 03/09/2020   ST elevation  myocardial infarction (STEMI) (HCC)    COVID-19 virus infection    Acute ST elevation myocardial infarction (STEMI) of anterior wall (HCC) 09/30/2019   Annual physical exam    Rectal polyp    Benign neoplasm of ascending colon    Internal hemorrhoids    Proctitis    Diverticulosis of large intestine without diverticulitis     ONSET DATE: 07/27/24  REFERRING DIAG: 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, unspecified complication of lung transplant, encounter for other specified prophylactic measures, other long term current drug therapy, encounter for therapeutic drug level   THERAPY DIAG:  Muscle weakness (generalized)  Unsteadiness on feet  Difficulty in walking, not elsewhere classified  Rationale for Evaluation and Treatment: Rehabilitation  SUBJECTIVE:                                                                                                                                                                                             SUBJECTIVE STATEMENT: Patient presents to PT for eval s/p lung transplant.  Pt accompanied by: significant other  PERTINENT HISTORY: Patient was seen for therapy in [place] until 10/28/24. Patient surgery  (07/27/24) was complicated by a return to the OR for bleeding 10/9 with open chest, closed on 10/10.  Patient was hospitalized for R pneumothorax on 09/02/24, had tubes put back in for about a week. PMHx includes CAD (prior PCI/stents 2010 pLAD, distal RCA/RPDA, all patent/non-obstructive CAD last LHC on 05/2024), HFpEF, HTN, aortic aneurysm, recurrent cystitis, GERD, prior smoker (80 pack years).  Patient has been compliant with HEP from PT and home program. Patient sent to this facility for improved strength and balance. Has an AFO On L foot for foot drop.   PAIN:  Are you having pain? No  PRECAUTIONS: Other: s/p  lung transplant  RED FLAGS: None   WEIGHT BEARING RESTRICTIONS: none that patient is aware of  FALLS: Has patient fallen in last 6 months?  No  LIVING ENVIRONMENT: Lives with: lives with their spouse Lives in: House/apartment Stairs: 6-7 in back, 2 in the front  Has following equipment at home: Walker - 4 wheeled, shower chair, and Grab bars  PLOF: Independent  PATIENT GOALS: to get stronger  OBJECTIVE:  Note: Objective measures were completed  at Evaluation unless otherwise noted.   COGNITION: Overall cognitive status: per wife slight impairment    SENSATION: WFL  COORDINATION: Heel slide: slight decrease coordination of LLE  Finger nose test: WFL   POSTURE: rounded shoulders   LOWER EXTREMITY MMT:    MMT Right Eval Left Eval  Hip flexion 4 4  Hip extension    Hip abduction 4 4-  Hip adduction 4 4  Hip internal rotation    Hip external rotation    Knee flexion 4 4  Knee extension 4 3+  Ankle dorsiflexion 4 Limited by AFO  Ankle plantarflexion 4 Limited by AFO   Ankle inversion    Ankle eversion    (Blank rows = not tested)  BED MOBILITY:  Per patient is ind  TRANSFERS: Sit to stand: Modified independence  Assistive device utilized: None     Stand to sit: Modified independence  Assistive device utilized: None      STAIRS: Findings: Level of Assistance: CGA, Stair Negotiation Technique: Alternating Pattern  with Bilateral Rails, Number of Stairs: 5, Height of Stairs: 6 in   , and Comments: use of afo on L foot  GAIT: Findings: Gait Characteristics: step through pattern and decreased stance time- Left, Distance walked: 45 ft, Level of assistance: CGA, and Comments: able to ambulate shorter durations without rollator, uses rollator for longer distances  FUNCTIONAL TESTS:     [facility] PT Assessment - 11/08/24 0001       Standardized Balance Assessment   Standardized Balance Assessment Berg Balance Test      Berg Balance Test   Sit to Stand Able to stand  independently using hands    Standing Unsupported Able to stand safely 2 minutes    Sitting with Back Unsupported but Feet Supported on  Floor or Stool Able to sit safely and securely 2 minutes    Stand to Sit Sits safely with minimal use of hands    Transfers Able to transfer safely, minor use of hands    Standing Unsupported with Eyes Closed Able to stand 3 seconds    Standing Unsupported with Feet Together Able to place feet together independently but unable to hold for 30 seconds    From Standing, Reach Forward with Outstretched Arm Can reach forward >12 cm safely (5)    From Standing Position, Pick up Object from Floor Able to pick up shoe, needs supervision    From Standing Position, Turn to Look Behind Over each Shoulder Looks behind from both sides and weight shifts well    Turn 360 Degrees Able to turn 360 degrees safely but slowly    Standing Unsupported, Alternately Place Feet on Step/Stool Able to stand independently and complete 8 steps >20 seconds    Standing Unsupported, One Foot in Front Able to take small step independently and hold 30 seconds    Standing on One Leg Tries to lift leg/unable to hold 3 seconds but remains standing independently    Total Score 41      Functional Gait  Assessment   Gait assessed  Yes    Gait Level Surface Walks 20 ft in less than 5.5 sec, no assistive devices, good speed, no evidence for imbalance, normal gait pattern, deviates no more than 6 in outside of the 12 in walkway width.    Change in Gait Speed Able to change speed, demonstrates mild gait deviations, deviates 6-10 in outside of the 12 in walkway width, or no gait deviations, unable to achieve a major  change in velocity, or uses a change in velocity, or uses an assistive device.    Gait with Horizontal Head Turns Performs head turns smoothly with no change in gait. Deviates no more than 6 in outside 12 in walkway width    Gait with Vertical Head Turns Performs task with slight change in gait velocity (eg, minor disruption to smooth gait path), deviates 6 - 10 in outside 12 in walkway width or uses assistive device    Gait  and Pivot Turn Pivot turns safely in greater than 3 sec and stops with no loss of balance, or pivot turns safely within 3 sec and stops with mild imbalance, requires small steps to catch balance.    Step Over Obstacle Is able to step over one shoe box (4.5 in total height) but must slow down and adjust steps to clear box safely. May require verbal cueing.    Gait with Narrow Base of Support Ambulates 4-7 steps.    Gait with Eyes Closed Walks 20 ft, no assistive devices, good speed, no evidence of imbalance, normal gait pattern, deviates no more than 6 in outside 12 in walkway width. Ambulates 20 ft in less than 7 sec.    Ambulating Backwards Walks 20 ft, no assistive devices, good speed, no evidence for imbalance, normal gait    Steps Alternating feet, must use rail.    Total Score 22          PATIENT SURVEYS:  Give ABC next session                                                                                                                             TREATMENT DATE: 11/08/24  Education on continuation of home program including strengthening, mobility, and walking. Exercises including squats, standing abduction, standing extension and prolonged walks around church.   BP: seated 140/88 standing 132/75   PATIENT EDUCATION: Education details: goals, POC, HEP continuation  Person educated: Patient and Spouse Education method: Explanation, Demonstration, Tactile cues, and Verbal cues Education comprehension: verbalized understanding, returned demonstration, verbal cues required, and tactile cues required  HOME EXERCISE PROGRAM: HEP continuation of prior program, expand next session   GOALS: Goals reviewed with patient? Yes  SHORT TERM GOALS: Target date: 11/22/2024    Patient will be independent in home exercise program to improve strength/mobility for better functional independence with ADLs. Baseline: continue current HEP  Goal status: INITIAL   LONG TERM GOALS: Target date:  01/31/2025  Patient will increase Functional Gait Assessment score to >28/30 as to reduce fall risk and improve dynamic gait safety with community ambulation. Baseline: 1/20: 22  Goal status: INITIAL  2.   Patient will demonstrate an improved Berg Balance Score of > 55 as to demonstrate improved balance with ADLs such as sitting/standing and transfer balance and reduced fall risk.  Baseline: 1/20: 41  Goal status: INITIAL  3.  Patient will increase ABC scale score >80% to demonstrate better functional  mobility and better confidence with ADLs.  Baseline:  Goal status: INITIAL  4.  Patient will increase six minute walk test distance to >1400 without an AD for progression to community ambulator and improve gait ability Baseline:  Goal status: INITIAL  5.  Patient (> 9 years old) will complete five times sit to stand test in < 15 seconds indicating an increased LE strength and improved balance. Baseline:  Goal status: INITIAL    ASSESSMENT:  CLINICAL IMPRESSION: Patient is a 69 y.o. male who was seen today for physical therapy evaluation and treatment. The patient is a post lung transplant recipient who presents with ongoing limitations in strength balance and ambulation capacity.  Patient underwent therapy at [facility] and is now transferring care. Patient ambulates longer durations with a rollator but is able to do shorter distances without an AD. Throughout the session vital signs were closely monitored and remained within acceptable ranges. The patient completed both the Functional Gait Assessment and the Berg Balance Scale. Results indicate that the patient demonstrates greater difficulty with static balance tasks compared to dynamic balance activities. Continued skilled physical therapy is recommended to address deficits in muscular strength postural control and overall functional mobility to improve safety and independence with ambulation and daily activities.  OBJECTIVE IMPAIRMENTS:  Abnormal gait, cardiopulmonary status limiting activity, decreased activity tolerance, decreased balance, decreased coordination, decreased endurance, decreased mobility, difficulty walking, decreased strength, impaired perceived functional ability, improper body mechanics, and postural dysfunction.   ACTIVITY LIMITATIONS: carrying, lifting, bending, sitting, standing, squatting, stairs, transfers, bathing, toileting, dressing, reach over head, hygiene/grooming, locomotion level, and caring for others  PARTICIPATION LIMITATIONS: meal prep, cleaning, laundry, medication management, interpersonal relationship, driving, shopping, community activity, occupation, and yard work  PERSONAL FACTORS: CAD (prior PCI/stents 2010 pLAD, distal RCA/RPDA, all patent/non-obstructive CAD last LHC on 05/2024), HFpEF, HTN, aortic aneurysm, recurrent cystitis, GERD, prior smoker (80 pack years).  are also affecting patient's functional outcome.   REHAB POTENTIAL: Good  CLINICAL DECISION MAKING: Evolving/moderate complexity  EVALUATION COMPLEXITY: Moderate  PLAN:  PT FREQUENCY: 2x/week  PT DURATION: 12 weeks  PLANNED INTERVENTIONS: 97164- PT Re-evaluation, 97750- Physical Performance Testing, 97110-Therapeutic exercises, 97530- Therapeutic activity, [id number]- Neuromuscular re-education, 97535- Self Care, [id number]- Manual therapy, [id number]- Gait training, [id number]- Orthotic Initial, [phone]- Orthotic/Prosthetic subsequent, [id number]- Canalith repositioning, [id number]- Splinting, [id number]- Ultrasound, 79439 (1-2 muscles), 20561 (3+ muscles)- Dry Needling, Patient/Family education, Balance training, Stair training, Taping, Joint mobilization, Spinal mobilization, Scar mobilization, Compression bandaging, Vestibular training, Visual/preceptual remediation/compensation, Cognitive remediation, DME instructions, Cryotherapy, Moist heat, and Biofeedback  PLAN FOR NEXT SESSION: ABC, 6 min walk test , 5x STS    Dang Mathison, PT 11/08/2024, 12:55  PM        "

## 2024-11-08 ENCOUNTER — Ambulatory Visit: Attending: Student in an Organized Health Care Education/Training Program

## 2024-11-08 DIAGNOSIS — M6281 Muscle weakness (generalized): Secondary | ICD-10-CM | POA: Insufficient documentation

## 2024-11-08 DIAGNOSIS — R262 Difficulty in walking, not elsewhere classified: Secondary | ICD-10-CM | POA: Insufficient documentation

## 2024-11-08 DIAGNOSIS — R2681 Unsteadiness on feet: Secondary | ICD-10-CM | POA: Insufficient documentation

## 2024-11-09 NOTE — Therapy (Signed)
 " OUTPATIENT PHYSICAL THERAPY NEURO TREATMENT   Patient Name: Robby Bulkley MRN: [id number] DOB:Mar 03, 1956, 69 y.o., male Today's Date: 11/10/2024   PCP: debara REFERRING PROVIDER: Pearlene Riis   END OF SESSION:  PT End of Session - 11/10/24 1442     Visit Number 2    Number of Visits 24    Date for Recertification  01/31/25    PT Start Time 1445    PT Stop Time 1529    PT Time Calculation (min) 44 min    Equipment Utilized During Treatment Gait belt    Activity Tolerance Patient tolerated treatment well    Behavior During Therapy WFL for tasks assessed/performed           Past Medical History:  Diagnosis Date   Chronic cough    COPD (chronic obstructive pulmonary disease) (HCC)    Coronary artery disease    Edentulous    Elevated cholesterol    GERD (gastroesophageal reflux disease)    Myocardial infarction (HCC)    Pulmonary fibrosis (HCC)    Restless leg syndrome    Supplemental oxygen  dependent    Past Surgical History:  Procedure Laterality Date   ANGIOPLASTY  2010   2 stents placed   BACK SURGERY     cardiac stents     x2   CATARACT EXTRACTION W/PHACO Left 08/20/2023   Procedure: CATARACT EXTRACTION PHACO AND INTRAOCULAR LENS PLACEMENT (IOC) LEFT 16.87 01:21.8;  Surgeon: Enola Feliciano Hugger, MD;  Location: [facility];  Service: Ophthalmology;  Laterality: Left;   CATARACT EXTRACTION W/PHACO Right 09/03/2023   Procedure: CATARACT EXTRACTION PHACO AND INTRAOCULAR LENS PLACEMENT (IOC) RIGHT 19.41 01:30.0;  Surgeon: Enola Feliciano Hugger, MD;  Location: [facility];  Service: Ophthalmology;  Laterality: Right;   COLONOSCOPY     COLONOSCOPY WITH PROPOFOL  N/A 03/05/2018   Procedure: COLONOSCOPY WITH PROPOFOL ;  Surgeon: Janalyn Keene NOVAK, MD;  Location: [facility] ENDOSCOPY;  Service: Endoscopy;  Laterality: N/A;   COLONOSCOPY WITH PROPOFOL  N/A 01/24/2022   Procedure: COLONOSCOPY WITH PROPOFOL ;  Surgeon: Therisa Bi, MD;  Location: [facility]  ENDOSCOPY;  Service: Gastroenterology;  Laterality: N/A;   CORONARY ANGIOPLASTY     CORONARY/GRAFT ACUTE MI REVASCULARIZATION N/A 09/30/2019   Procedure: Coronary/Graft Acute MI Revascularization;  Surgeon: Darron Deatrice LABOR, MD;  Location: [facility] INVASIVE CV LAB;  Service: Cardiovascular;  Laterality: N/A;   LEFT HEART CATH AND CORONARY ANGIOGRAPHY N/A 09/30/2019   Procedure: LEFT HEART CATH AND CORONARY ANGIOGRAPHY;  Surgeon: Darron Deatrice LABOR, MD;  Location: [facility] INVASIVE CV LAB;  Service: Cardiovascular;  Laterality: N/A;   LEFT HEART CATH AND CORS/GRAFTS ANGIOGRAPHY N/A 10/01/2021   Procedure: LEFT HEART CATH AND CORS/GRAFTS ANGIOGRAPHY;  Surgeon: Mady Bruckner, MD;  Location: [facility] INVASIVE CV LAB;  Service: Cardiovascular;  Laterality: N/A;   Patient Active Problem List   Diagnosis Date Noted   Spontaneous pneumothorax 02/24/2024   Chronic heart failure with preserved ejection fraction (HFpEF) (HCC) 02/24/2024   COPD with acute exacerbation (HCC) 11/17/2023   Pulmonary hypertension (HCC) 11/17/2023   GERD without esophagitis 11/17/2023   Coronary artery disease 11/17/2023   Acute respiratory failure with hypoxia (HCC) 11/16/2023   Acute on chronic hypoxic respiratory failure (HCC) 10/24/2022   Difficult or painful urination 12/23/2021   Impaired fasting glucose    Non-ST elevation (NSTEMI) myocardial infarction (HCC) 09/30/2021   Chest pain 09/30/2021   Elevated glucose level 09/30/2021   Weight loss 06/12/2021   Abrasion of left arm 12/07/2020   Chronic obstructive pulmonary  disease (HCC) 12/07/2020   Essential hypertension 12/07/2020   Fever in adult 09/07/2020   Dyslipidemia 09/07/2020   Cellulitis of left buttock 06/11/2020   Pulmonary fibrosis (HCC) 05/11/2020   Coronary artery disease due to lipid rich plaque 05/11/2020   Dermatitis 05/11/2020   Disorder of left rotator cuff 03/09/2020   ED (erectile dysfunction) of non-organic origin 03/09/2020   ST elevation  myocardial infarction (STEMI) (HCC)    COVID-19 virus infection    Acute ST elevation myocardial infarction (STEMI) of anterior wall (HCC) 09/30/2019   Annual physical exam    Rectal polyp    Benign neoplasm of ascending colon    Internal hemorrhoids    Proctitis    Diverticulosis of large intestine without diverticulitis     ONSET DATE: 07/27/24  REFERRING DIAG: 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, unspecified complication of lung transplant, encounter for other specified prophylactic measures, other long term current drug therapy, encounter for therapeutic drug level   THERAPY DIAG:  Muscle weakness (generalized)  Unsteadiness on feet  Difficulty in walking, not elsewhere classified  Rationale for Evaluation and Treatment: Rehabilitation  SUBJECTIVE:                                                                                                                                                                                             SUBJECTIVE STATEMENT: Patient reports he is feeling pretty good. No changes since evaluation.  Pt accompanied by: significant other  PERTINENT HISTORY: Patient was seen for therapy in [place] until 10/28/24. Patient surgery  (07/27/24) was complicated by a return to the OR for bleeding 10/9 with open chest, closed on 10/10.  Patient was hospitalized for R pneumothorax on 09/02/24, had tubes put back in for about a week. PMHx includes CAD (prior PCI/stents 2010 pLAD, distal RCA/RPDA, all patent/non-obstructive CAD last LHC on 05/2024), HFpEF, HTN, aortic aneurysm, recurrent cystitis, GERD, prior smoker (80 pack years).  Patient has been compliant with HEP from PT and home program. Patient sent to this facility for improved strength and balance. Has an AFO On L foot for foot drop.   PAIN:  Are you having pain? No  PRECAUTIONS: Other: s/p  lung transplant  RED FLAGS: None   WEIGHT BEARING RESTRICTIONS: none that patient is aware of  FALLS: Has patient fallen  in last 6 months? No  LIVING ENVIRONMENT: Lives with: lives with their spouse Lives in: House/apartment Stairs: 6-7 in back, 2 in the front  Has following equipment at home: Walker - 4 wheeled, shower chair, and Grab bars  PLOF: Independent  PATIENT GOALS: to get stronger  OBJECTIVE:  Note: Objective  measures were completed at Evaluation unless otherwise noted.   COGNITION: Overall cognitive status: per wife slight impairment    SENSATION: WFL  COORDINATION: Heel slide: slight decrease coordination of LLE  Finger nose test: WFL   POSTURE: rounded shoulders   LOWER EXTREMITY MMT:    MMT Right Eval Left Eval  Hip flexion 4 4  Hip extension    Hip abduction 4 4-  Hip adduction 4 4  Hip internal rotation    Hip external rotation    Knee flexion 4 4  Knee extension 4 3+  Ankle dorsiflexion 4 Limited by AFO  Ankle plantarflexion 4 Limited by AFO   Ankle inversion    Ankle eversion    (Blank rows = not tested)  BED MOBILITY:  Per patient is ind  TRANSFERS: Sit to stand: Modified independence  Assistive device utilized: None     Stand to sit: Modified independence  Assistive device utilized: None      STAIRS: Findings: Level of Assistance: CGA, Stair Negotiation Technique: Alternating Pattern  with Bilateral Rails, Number of Stairs: 5, Height of Stairs: 6 in   , and Comments: use of afo on L foot  GAIT: Findings: Gait Characteristics: step through pattern and decreased stance time- Left, Distance walked: 45 ft, Level of assistance: CGA, and Comments: able to ambulate shorter durations without rollator, uses rollator for longer distances  FUNCTIONAL TESTS:       PATIENT SURVEYS:  Give ABC next session                                                                                                                             TREATMENT DATE: 11/10/24  BP: seated 148/91 pre 6 min walk test ; post 6 min walk test: 191/91; 3 minutes post 151/85  ABC=  73.25%  6 Min Walk Test:  Instructed patient to ambulate as quickly and as safely as possible for 6 minutes using LRAD. Patient was allowed to take standing rest breaks without stopping the test, but if the patient required a sitting rest break the clock would be stopped and the test would be over.  Results: 1275 feet with rollator. Results indicate that the patient has reduced endurance with ambulation compared to age matched norms.  Age Matched Norms: 60-69 yo M: 26 F: 61, 42-79 yo M: 13 F: 471, 31-89 yo M: 417 F: 392 MDC: 58.21 meters (190.98 feet) or 50 meters (ANPTA Core Set of Outcome Measures for Adults with Neurologic Conditions, 2018)  Pt performed 5 time sit<>stand (5xSTS): 23.28 sec (>15 sec indicates increased fall risk)   Home program:  Access Code: [email or web address] URL: [email or web address] Date: 11/10/2024 Prepared by: Nathalie Custard  Program Notes Do these exercises 2-3 times a week making sure you have a day of rest in between sessions. Before you start a session, warm up with a brisk walk and stretches for your arms, legs and back. Start with 2 sets of 10 of each  exercise If you have no pain or swelling, next time do 3 sets of 10. If still no pain the next session, its ok to add some small weight, but back to down to 2 sets of 10 . the next time do 3 sets of 10 with that weight and continue to progress with that pattern.  If you increase weight, decrease reps, or if you increase reps, keep weight the same.  If you do have symptoms,  back off on your weight or reps.  If for some reason, you do skip some scheduled exercise sessions, start over with minimal weight and build up slowly again.Your overall exercise goal is about 150 minutes of moderate intensity (you can talk but not sing)  exercise a week that includes 2 sessions of strength training.  GO FOR IT!  :)  Exercises - Seated Ankle Alphabet  - 1 x daily - 7 x weekly - 2 sets - 1 reps - 5 hold - Squat with Chair  Touch  - 2 x weekly - 2-3 sets - 10 reps - Standing Hip Abduction  - 2 x weekly - 2-3 sets - 10 reps - Standing March With Unilateral Resistance  - 1 x daily - 7 x weekly - 2 sets - 10 reps - 5 hold   PATIENT EDUCATION: Education details: goals, POC, HEP continuation  Person educated: Patient and Spouse Education method: Explanation, Demonstration, Tactile cues, and Verbal cues Education comprehension: verbalized understanding, returned demonstration, verbal cues required, and tactile cues required  HOME EXERCISE PROGRAM: HEP continuation of prior program, expand next session   Access Code: [email or web address] URL: [email or web address] Date: 11/10/2024 Prepared by: Kemi Gell  Program Notes Do these exercises 2-3 times a week making sure you have a day of rest in between sessions. Before you start a session, warm up with a brisk walk and stretches for your arms, legs and back. Start with 2 sets of 10 of each exercise If you have no pain or swelling, next time do 3 sets of 10. If still no pain the next session, its ok to add some small weight, but back to down to 2 sets of 10 . the next time do 3 sets of 10 with that weight and continue to progress with that pattern.  If you increase weight, decrease reps, or if you increase reps, keep weight the same.  If you do have symptoms,  back off on your weight or reps.  If for some reason, you do skip some scheduled exercise sessions, start over with minimal weight and build up slowly again.Your overall exercise goal is about 150 minutes of moderate intensity (you can talk but not sing)  exercise a week that includes 2 sessions of strength training.  GO FOR IT!  :)  Exercises - Seated Ankle Alphabet  - 1 x daily - 7 x weekly - 2 sets - 1 reps - 5 hold - Squat with Chair Touch  - 2 x weekly - 2-3 sets - 10 reps - Standing Hip Abduction  - 2 x weekly - 2-3 sets - 10 reps - Standing March With Unilateral Resistance  - 1 x daily - 7 x weekly - 2  sets - 10 reps - 5 hold  GOALS: Goals reviewed with patient? Yes  SHORT TERM GOALS: Target date: 11/22/2024    Patient will be independent in home exercise program to improve strength/mobility for better functional independence with ADLs. Baseline: continue current HEP  Goal status: INITIAL   LONG  TERM GOALS: Target date: 01/31/2025  Patient will increase Functional Gait Assessment score to >28/30 as to reduce fall risk and improve dynamic gait safety with community ambulation. Baseline: 1/20: 22  Goal status: INITIAL  2.   Patient will demonstrate an improved Berg Balance Score of > 55 as to demonstrate improved balance with ADLs such as sitting/standing and transfer balance and reduced fall risk.  Baseline: 1/20: 41  Goal status: INITIAL  3.  Patient will increase ABC scale score >80% to demonstrate better functional mobility and better confidence with ADLs.  Baseline: 73.25% Goal status: INITIAL  4.  Patient will increase six minute walk test distance to >1400 without an AD for progression to community ambulator and improve gait ability Baseline: 1250 with rollator Goal status: INITIAL  5.  Patient (> 65 years old) will complete five times sit to stand test in < 15 seconds indicating an increased LE strength and improved balance. Baseline: 23.28 sec  Goal status: INITIAL    ASSESSMENT:  CLINICAL IMPRESSION: Progression of HEP from home program given at speciality clinic provided with patient demonstrating understanding. Continuation of goals with performance of 6 min walk test and 5x STS added to goals . Patient agreeable with POC and is eager to progress his strength and mobility. Continued skilled physical therapy is recommended to address deficits in muscular strength postural control and overall functional mobility to improve safety and independence with ambulation and daily activities.  OBJECTIVE IMPAIRMENTS: Abnormal gait, cardiopulmonary status limiting activity,  decreased activity tolerance, decreased balance, decreased coordination, decreased endurance, decreased mobility, difficulty walking, decreased strength, impaired perceived functional ability, improper body mechanics, and postural dysfunction.   ACTIVITY LIMITATIONS: carrying, lifting, bending, sitting, standing, squatting, stairs, transfers, bathing, toileting, dressing, reach over head, hygiene/grooming, locomotion level, and caring for others  PARTICIPATION LIMITATIONS: meal prep, cleaning, laundry, medication management, interpersonal relationship, driving, shopping, community activity, occupation, and yard work  PERSONAL FACTORS: CAD (prior PCI/stents 2010 pLAD, distal RCA/RPDA, all patent/non-obstructive CAD last LHC on 05/2024), HFpEF, HTN, aortic aneurysm, recurrent cystitis, GERD, prior smoker (80 pack years).  are also affecting patient's functional outcome.   REHAB POTENTIAL: Good  CLINICAL DECISION MAKING: Evolving/moderate complexity  EVALUATION COMPLEXITY: Moderate  PLAN:  PT FREQUENCY: 2x/week  PT DURATION: 12 weeks  PLANNED INTERVENTIONS: 97164- PT Re-evaluation, 97750- Physical Performance Testing, 97110-Therapeutic exercises, 97530- Therapeutic activity, [id number]- Neuromuscular re-education, 97535- Self Care, [id number]- Manual therapy, [id number]- Gait training, [phone]- Orthotic Initial, [phone]- Orthotic/Prosthetic subsequent, [phone]- Canalith repositioning, [id number]- Splinting, [id number]- Ultrasound, [id number] (1-2 muscles), [id number] (3+ muscles)- Dry Needling, Patient/Family education, Balance training, Stair training, Taping, Joint mobilization, Spinal mobilization, Scar mobilization, Compression bandaging, Vestibular training, Visual/preceptual remediation/compensation, Cognitive remediation, DME instructions, Cryotherapy, Moist heat, and Biofeedback  PLAN FOR NEXT SESSION: Nustep, ankle strengthening, LE strengthening    Amee Boothe, PT 11/10/2024, 3:38 PM        "

## 2024-11-10 ENCOUNTER — Ambulatory Visit

## 2024-11-10 DIAGNOSIS — R2681 Unsteadiness on feet: Secondary | ICD-10-CM

## 2024-11-10 DIAGNOSIS — M6281 Muscle weakness (generalized): Secondary | ICD-10-CM

## 2024-11-10 DIAGNOSIS — R262 Difficulty in walking, not elsewhere classified: Secondary | ICD-10-CM

## 2024-11-15 ENCOUNTER — Ambulatory Visit

## 2024-11-15 DIAGNOSIS — R2681 Unsteadiness on feet: Secondary | ICD-10-CM

## 2024-11-15 DIAGNOSIS — M6281 Muscle weakness (generalized): Secondary | ICD-10-CM

## 2024-11-15 DIAGNOSIS — R262 Difficulty in walking, not elsewhere classified: Secondary | ICD-10-CM

## 2024-11-15 NOTE — Therapy (Signed)
 " OUTPATIENT PHYSICAL THERAPY NEURO TREATMENT   Patient Name: Dillon [name] MRN: [id number] DOB:1956-03-29, 69 y.o., male Today's Date: 11/15/2024   PCP: debara REFERRING PROVIDER: Pearlene Riis   END OF SESSION:  PT End of Session - 11/15/24 1138     Visit Number 3    Number of Visits 24    Date for Recertification  01/31/25    PT Start Time 1145    PT Stop Time 1229    PT Time Calculation (min) 44 min    Equipment Utilized During Treatment Gait belt    Activity Tolerance Patient tolerated treatment well    Behavior During Therapy WFL for tasks assessed/performed            Past Medical History:  Diagnosis Date   Chronic cough    COPD (chronic obstructive pulmonary disease) (HCC)    Coronary artery disease    Edentulous    Elevated cholesterol    GERD (gastroesophageal reflux disease)    Myocardial infarction (HCC)    Pulmonary fibrosis (HCC)    Restless leg syndrome    Supplemental oxygen  dependent    Past Surgical History:  Procedure Laterality Date   ANGIOPLASTY  2010   2 stents placed   BACK SURGERY     cardiac stents     x2   CATARACT EXTRACTION W/PHACO Left 08/20/2023   Procedure: CATARACT EXTRACTION PHACO AND INTRAOCULAR LENS PLACEMENT (IOC) LEFT 16.87 01:21.8;  Surgeon: Enola Feliciano Hugger, MD;  Location: [facility];  Service: Ophthalmology;  Laterality: Left;   CATARACT EXTRACTION W/PHACO Right 09/03/2023   Procedure: CATARACT EXTRACTION PHACO AND INTRAOCULAR LENS PLACEMENT (IOC) RIGHT 19.41 01:30.0;  Surgeon: Enola Feliciano Hugger, MD;  Location: [facility];  Service: Ophthalmology;  Laterality: Right;   COLONOSCOPY     COLONOSCOPY WITH PROPOFOL  N/A 03/05/2018   Procedure: COLONOSCOPY WITH PROPOFOL ;  Surgeon: Janalyn Keene NOVAK, MD;  Location: [facility];  Service: Endoscopy;  Laterality: N/A;   COLONOSCOPY WITH PROPOFOL  N/A 01/24/2022   Procedure: COLONOSCOPY WITH PROPOFOL ;  Surgeon: Therisa Bi, MD;  Location: [facility]  ENDOSCOPY;  Service: Gastroenterology;  Laterality: N/A;   CORONARY ANGIOPLASTY     CORONARY/GRAFT ACUTE MI REVASCULARIZATION N/A 09/30/2019   Procedure: Coronary/Graft Acute MI Revascularization;  Surgeon: Darron Deatrice LABOR, MD;  Location: [facility];  Service: Cardiovascular;  Laterality: N/A;   LEFT HEART CATH AND CORONARY ANGIOGRAPHY N/A 09/30/2019   Procedure: LEFT HEART CATH AND CORONARY ANGIOGRAPHY;  Surgeon: Darron Deatrice LABOR, MD;  Location: [facility];  Service: Cardiovascular;  Laterality: N/A;   LEFT HEART CATH AND CORS/GRAFTS ANGIOGRAPHY N/A 10/01/2021   Procedure: LEFT HEART CATH AND CORS/GRAFTS ANGIOGRAPHY;  Surgeon: Mady Bruckner, MD;  Location: [facility];  Service: Cardiovascular;  Laterality: N/A;   Patient Active Problem List   Diagnosis Date Noted   Spontaneous pneumothorax 02/24/2024   Chronic heart failure with preserved ejection fraction (HFpEF) (HCC) 02/24/2024   COPD with acute exacerbation (HCC) 11/17/2023   Pulmonary hypertension (HCC) 11/17/2023   GERD without esophagitis 11/17/2023   Coronary artery disease 11/17/2023   Acute respiratory failure with hypoxia (HCC) 11/16/2023   Acute on chronic hypoxic respiratory failure (HCC) 10/24/2022   Difficult or painful urination 12/23/2021   Impaired fasting glucose    Non-ST elevation (NSTEMI) myocardial infarction (HCC) 09/30/2021   Chest pain 09/30/2021   Elevated glucose level 09/30/2021   Weight loss 06/12/2021   Abrasion of left arm 12/07/2020   Chronic obstructive  pulmonary disease (HCC) 12/07/2020   Essential hypertension 12/07/2020   Fever in adult 09/07/2020   Dyslipidemia 09/07/2020   Cellulitis of left buttock 06/11/2020   Pulmonary fibrosis (HCC) 05/11/2020   Coronary artery disease due to lipid rich plaque 05/11/2020   Dermatitis 05/11/2020   Disorder of left rotator cuff 03/09/2020   ED (erectile dysfunction) of non-organic origin 03/09/2020   ST elevation  myocardial infarction (STEMI) (HCC)    COVID-19 virus infection    Acute ST elevation myocardial infarction (STEMI) of anterior wall (HCC) 09/30/2019   Annual physical exam    Rectal polyp    Benign neoplasm of ascending colon    Internal hemorrhoids    Proctitis    Diverticulosis of large intestine without diverticulitis     ONSET DATE: 07/27/24  REFERRING DIAG: 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, unspecified complication of lung transplant, encounter for other specified prophylactic measures, other long term current drug therapy, encounter for therapeutic drug level   THERAPY DIAG:  Muscle weakness (generalized)  Unsteadiness on feet  Difficulty in walking, not elsewhere classified  Rationale for Evaluation and Treatment: Rehabilitation  SUBJECTIVE:                                                                                                                                                                                             SUBJECTIVE STATEMENT: Patient reports compliance with HEP except walking due to the snow/ice.  Pt accompanied by: significant other  PERTINENT HISTORY: Patient was seen for therapy in [place] until 10/28/24. Patient surgery  (07/27/24) was complicated by a return to the OR for bleeding 10/9 with open chest, closed on 10/10.  Patient was hospitalized for R pneumothorax on 09/02/24, had tubes put back in for about a week. PMHx includes CAD (prior PCI/stents 2010 pLAD, distal RCA/RPDA, all patent/non-obstructive CAD last LHC on 05/2024), HFpEF, HTN, aortic aneurysm, recurrent cystitis, GERD, prior smoker (80 pack years).  Patient has been compliant with HEP from PT and home program. Patient sent to this facility for improved strength and balance. Has an AFO On L foot for foot drop.   PAIN:  Are you having pain? No  PRECAUTIONS: Other: s/p  lung transplant  RED FLAGS: None   WEIGHT BEARING RESTRICTIONS: none that patient is aware of  FALLS: Has patient fallen  in last 6 months? No  LIVING ENVIRONMENT: Lives with: lives with their spouse Lives in: House/apartment Stairs: 6-7 in back, 2 in the front  Has following equipment at home: Walker - 4 wheeled, shower chair, and Grab bars  PLOF: Independent  PATIENT GOALS: to get stronger  OBJECTIVE:  Note:  Objective measures were completed at Evaluation unless otherwise noted.   COGNITION: Overall cognitive status: per wife slight impairment    SENSATION: WFL  COORDINATION: Heel slide: slight decrease coordination of LLE  Finger nose test: WFL   POSTURE: rounded shoulders   LOWER EXTREMITY MMT:    MMT Right Eval Left Eval  Hip flexion 4 4  Hip extension    Hip abduction 4 4-  Hip adduction 4 4  Hip internal rotation    Hip external rotation    Knee flexion 4 4  Knee extension 4 3+  Ankle dorsiflexion 4 Limited by AFO  Ankle plantarflexion 4 Limited by AFO   Ankle inversion    Ankle eversion    (Blank rows = not tested)  BED MOBILITY:  Per patient is ind  TRANSFERS: Sit to stand: Modified independence  Assistive device utilized: None     Stand to sit: Modified independence  Assistive device utilized: None      STAIRS: Findings: Level of Assistance: CGA, Stair Negotiation Technique: Alternating Pattern  with Bilateral Rails, Number of Stairs: 5, Height of Stairs: 6 in   , and Comments: use of afo on L foot  GAIT: Findings: Gait Characteristics: step through pattern and decreased stance time- Left, Distance walked: 45 ft, Level of assistance: CGA, and Comments: able to ambulate shorter durations without rollator, uses rollator for longer distances  FUNCTIONAL TESTS:       PATIENT SURVEYS:  Give ABC next session                                                                                                                               TREATMENT DATE: 11/15/24  BP: seated 137/81   Nustep: Lvl 1-6 cues and guidance for increasing challenge and monitoring of vitals  and RPE. 1 minute per level ; return to level one for final minutes  TA- To improve functional movements patterns for everyday tasks  Superset 1:x3 sets 10x STS with arm crossed superset with 160 ft ambulation with 5lb AW ; monitoring of vitals   TE- To improve strength, endurance, mobility, and function of specific targeted muscle groups or improve joint range of motion or improve muscle flexibility 5lb AW seated: -march 15x each LE  -LAQ 10x each LE ; hold 3 seconds  -lateral step over band 10x each LE     PATIENT EDUCATION: Education details: goals, POC, HEP continuation  Person educated: Patient and Spouse Education method: Explanation, Demonstration, Tactile cues, and Verbal cues Education comprehension: verbalized understanding, returned demonstration, verbal cues required, and tactile cues required  HOME EXERCISE PROGRAM: HEP continuation of prior program, expand next session   Access Code: [email or web address] URL: [email or web address] Date: 11/10/2024 Prepared by: Hildred Mollica  Program Notes Do these exercises 2-3 times a week making sure you have a day of rest in between sessions. Before you start a session, warm up with a brisk walk and stretches for your arms, legs  and back. Start with 2 sets of 10 of each exercise If you have no pain or swelling, next time do 3 sets of 10. If still no pain the next session, its ok to add some small weight, but back to down to 2 sets of 10 . the next time do 3 sets of 10 with that weight and continue to progress with that pattern.  If you increase weight, decrease reps, or if you increase reps, keep weight the same.  If you do have symptoms,  back off on your weight or reps.  If for some reason, you do skip some scheduled exercise sessions, start over with minimal weight and build up slowly again.Your overall exercise goal is about 150 minutes of moderate intensity (you can talk but not sing)  exercise a week that includes 2 sessions of  strength training.  GO FOR IT!  :)  Exercises - Seated Ankle Alphabet  - 1 x daily - 7 x weekly - 2 sets - 1 reps - 5 hold - Squat with Chair Touch  - 2 x weekly - 2-3 sets - 10 reps - Standing Hip Abduction  - 2 x weekly - 2-3 sets - 10 reps - Standing March With Unilateral Resistance  - 1 x daily - 7 x weekly - 2 sets - 10 reps - 5 hold  GOALS: Goals reviewed with patient? Yes  SHORT TERM GOALS: Target date: 11/22/2024    Patient will be independent in home exercise program to improve strength/mobility for better functional independence with ADLs. Baseline: continue current HEP  Goal status: INITIAL   LONG TERM GOALS: Target date: 01/31/2025  Patient will increase Functional Gait Assessment score to >28/30 as to reduce fall risk and improve dynamic gait safety with community ambulation. Baseline: 1/20: 22  Goal status: INITIAL  2.   Patient will demonstrate an improved Berg Balance Score of > 55 as to demonstrate improved balance with ADLs such as sitting/standing and transfer balance and reduced fall risk.  Baseline: 1/20: 41  Goal status: INITIAL  3.  Patient will increase ABC scale score >80% to demonstrate better functional mobility and better confidence with ADLs.  Baseline: 73.25% Goal status: INITIAL  4.  Patient will increase six minute walk test distance to >1400 without an AD for progression to community ambulator and improve gait ability Baseline: 1250 with rollator Goal status: INITIAL  5.  Patient (> 88 years old) will complete five times sit to stand test in < 15 seconds indicating an increased LE strength and improved balance. Baseline: 23.28 sec  Goal status: INITIAL    ASSESSMENT:  CLINICAL IMPRESSION: Patient tolerates introduction to Nustep with close guidance and monitoring for vitals and RPE scale.  Education on UE movement with sit to stand to assist functional mobility tolerated well. Patient short of breath and fatigued from superset but Sp02  remains >94%. Continued skilled physical therapy is recommended to address deficits in muscular strength postural control and overall functional mobility to improve safety and independence with ambulation and daily activities.  OBJECTIVE IMPAIRMENTS: Abnormal gait, cardiopulmonary status limiting activity, decreased activity tolerance, decreased balance, decreased coordination, decreased endurance, decreased mobility, difficulty walking, decreased strength, impaired perceived functional ability, improper body mechanics, and postural dysfunction.   ACTIVITY LIMITATIONS: carrying, lifting, bending, sitting, standing, squatting, stairs, transfers, bathing, toileting, dressing, reach over head, hygiene/grooming, locomotion level, and caring for others  PARTICIPATION LIMITATIONS: meal prep, cleaning, laundry, medication management, interpersonal relationship, driving, shopping, community activity, occupation, and yard  work  PERSONAL FACTORS: CAD (prior PCI/stents 2010 pLAD, distal RCA/RPDA, all patent/non-obstructive CAD last LHC on 05/2024), HFpEF, HTN, aortic aneurysm, recurrent cystitis, GERD, prior smoker (80 pack years).  are also affecting patient's functional outcome.   REHAB POTENTIAL: Good  CLINICAL DECISION MAKING: Evolving/moderate complexity  EVALUATION COMPLEXITY: Moderate  PLAN:  PT FREQUENCY: 2x/week  PT DURATION: 12 weeks  PLANNED INTERVENTIONS: 97164- PT Re-evaluation, 97750- Physical Performance Testing, 97110-Therapeutic exercises, 97530- Therapeutic activity, [id number]- Neuromuscular re-education, 97535- Self Care, [id number]- Manual therapy, [id number]- Gait training, [phone]- Orthotic Initial, [phone]- Orthotic/Prosthetic subsequent, [phone]- Canalith repositioning, [id number]- Splinting, [id number]- Ultrasound, [id number] (1-2 muscles), [id number] (3+ muscles)- Dry Needling, Patient/Family education, Balance training, Stair training, Taping, Joint mobilization, Spinal mobilization, Scar mobilization, Compression  bandaging, Vestibular training, Visual/preceptual remediation/compensation, Cognitive remediation, DME instructions, Cryotherapy, Moist heat, and Biofeedback  PLAN FOR NEXT SESSION: Nustep, ankle strengthening, LE strengthening    Zarahi Fuerst, PT 11/15/2024, 12:29 PM        "

## 2024-11-18 ENCOUNTER — Ambulatory Visit: Admitting: Physical Therapy

## 2024-11-18 DIAGNOSIS — R262 Difficulty in walking, not elsewhere classified: Secondary | ICD-10-CM

## 2024-11-18 DIAGNOSIS — R2681 Unsteadiness on feet: Secondary | ICD-10-CM

## 2024-11-18 DIAGNOSIS — M6281 Muscle weakness (generalized): Secondary | ICD-10-CM

## 2024-11-22 ENCOUNTER — Ambulatory Visit

## 2024-11-22 DIAGNOSIS — M6281 Muscle weakness (generalized): Secondary | ICD-10-CM

## 2024-11-22 DIAGNOSIS — R2681 Unsteadiness on feet: Secondary | ICD-10-CM

## 2024-11-22 DIAGNOSIS — R262 Difficulty in walking, not elsewhere classified: Secondary | ICD-10-CM

## 2024-11-24 ENCOUNTER — Ambulatory Visit: Admitting: Physical Therapy

## 2024-11-24 DIAGNOSIS — R262 Difficulty in walking, not elsewhere classified: Secondary | ICD-10-CM

## 2024-11-24 DIAGNOSIS — M6281 Muscle weakness (generalized): Secondary | ICD-10-CM

## 2024-11-24 DIAGNOSIS — R2681 Unsteadiness on feet: Secondary | ICD-10-CM

## 2024-11-24 NOTE — Therapy (Signed)
 " OUTPATIENT PHYSICAL THERAPY NEURO TREATMENT   Patient Name: Dillon [name] MRN: [id number] DOB:07-Apr-1956, 69 y.o., male Today's Date: 11/24/2024   PCP: debara REFERRING PROVIDER: Pearlene Riis   END OF SESSION:  PT End of Session - 11/24/24 1019     Visit Number 6    Number of Visits 24    Date for Recertification  01/31/25    Authorization - Number of Visits 10    PT Start Time 1020    PT Stop Time 1100    PT Time Calculation (min) 40 min    Equipment Utilized During Treatment Gait belt    Activity Tolerance Patient tolerated treatment well    Behavior During Therapy WFL for tasks assessed/performed             Past Medical History:  Diagnosis Date   Chronic cough    COPD (chronic obstructive pulmonary disease) (HCC)    Coronary artery disease    Edentulous    Elevated cholesterol    GERD (gastroesophageal reflux disease)    Myocardial infarction (HCC)    Pulmonary fibrosis (HCC)    Restless leg syndrome    Supplemental oxygen  dependent    Past Surgical History:  Procedure Laterality Date   ANGIOPLASTY  2010   2 stents placed   BACK SURGERY     cardiac stents     x2   CATARACT EXTRACTION W/PHACO Left 08/20/2023   Procedure: CATARACT EXTRACTION PHACO AND INTRAOCULAR LENS PLACEMENT (IOC) LEFT 16.87 01:21.8;  Surgeon: Enola Feliciano Hugger, MD;  Location: [facility];  Service: Ophthalmology;  Laterality: Left;   CATARACT EXTRACTION W/PHACO Right 09/03/2023   Procedure: CATARACT EXTRACTION PHACO AND INTRAOCULAR LENS PLACEMENT (IOC) RIGHT 19.41 01:30.0;  Surgeon: Enola Feliciano Hugger, MD;  Location: [facility];  Service: Ophthalmology;  Laterality: Right;   COLONOSCOPY     COLONOSCOPY WITH PROPOFOL  N/A 03/05/2018   Procedure: COLONOSCOPY WITH PROPOFOL ;  Surgeon: Janalyn Keene NOVAK, MD;  Location: [facility] END[facility];  Service: Endoscopy;  Laterality: N/A;   COLONOSCOPY WITH PROPOFOL  N/A 01/24/2022   Procedure: COLONOSCOPY WITH PROPOFOL ;   Surgeon: Therisa Bi, MD;  Location: [facility] END[facility]COPY;  Service: Gastroenterology;  Laterality: N/A;   CORONARY ANGIOPLASTY     CORONARY/GRAFT ACUTE MI REVASCULARIZATION N/A 09/30/2019   Procedure: Coronary/Graft Acute MI Revascularization;  Surgeon: Darron Deatrice LABOR, MD;  Location: [facility] IN[facility];  Service: Cardiovascular;  Laterality: N/A;   LEFT HEART CATH AND CORONARY ANGIOGRAPHY N/A 09/30/2019   Procedure: LEFT HEART CATH AND CORONARY ANGIOGRAPHY;  Surgeon: Darron Deatrice LABOR, MD;  Location: [facility];  Service: Cardiovascular;  Laterality: N/A;   LEFT HEART CATH AND CORS/GRAFTS ANGIOGRAPHY N/A 10/01/2021   Procedure: LEFT HEART CATH AND CORS/GRAFTS ANGIOGRAPHY;  Surgeon: Mady Bruckner, MD;  Location: [facility];  Service: Cardiovascular;  Laterality: N/A;   Patient Active Problem List   Diagnosis Date Noted   Spontaneous pneumothorax 02/24/2024   Chronic heart failure with preserved ejection fraction (HFpEF) (HCC) 02/24/2024   COPD with acute exacerbation (HCC) 11/17/2023   Pulmonary hypertension (HCC) 11/17/2023   GERD without esophagitis 11/17/2023   Coronary artery disease 11/17/2023   Acute respiratory failure with hypoxia (HCC) 11/16/2023   Acute on chronic hypoxic respiratory failure (HCC) 10/24/2022   Difficult or painful urination 12/23/2021   Impaired fasting glucose    Non-ST elevation (NSTEMI) myocardial infarction (HCC) 09/30/2021   Chest pain 09/30/2021   Elevated glucose level 09/30/2021   Weight loss 06/12/2021  Abrasion of left arm 12/07/2020   Chronic obstructive pulmonary disease (HCC) 12/07/2020   Essential hypertension 12/07/2020   Fever in adult 09/07/2020   Dyslipidemia 09/07/2020   Cellulitis of left buttock 06/11/2020   Pulmonary fibrosis (HCC) 05/11/2020   Coronary artery disease due to lipid rich plaque 05/11/2020   Dermatitis 05/11/2020   Disorder of left rotator cuff 03/09/2020   ED (erectile dysfunction) of non-organic  origin 03/09/2020   ST elevation myocardial infarction (STEMI) (HCC)    COVID-19 virus infection    Acute ST elevation myocardial infarction (STEMI) of anterior wall (HCC) 09/30/2019   Annual physical exam    Rectal polyp    Benign neoplasm of ascending colon    Internal hemorrhoids    Proctitis    Diverticulosis of large intestine without diverticulitis     ONSET DATE: 07/27/24  REFERRING DIAG: 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, unspecified complication of lung transplant, encounter for other specified prophylactic measures, other long term current drug therapy, encounter for therapeutic drug level   THERAPY DIAG:  Muscle weakness (generalized)  Unsteadiness on feet  Difficulty in walking, not elsewhere classified  Rationale for Evaluation and Treatment: Rehabilitation  SUBJECTIVE:                                                                                                                                                                                             SUBJECTIVE STATEMENT:  Pt reports that he is doing well. No pain reported by pt. Patient continues to report compliance with HEP. Has been challenged with the sit to stands but they are feeling a little easier. Pt will be having lung biopsy next Monday to assess for rejection. Is nervous, because he was hospitalized for 2 weeks following last rejection.   Pt accompanied by: significant other  PERTINENT HISTORY: Patient was seen for therapy in [place] until 10/28/24. Patient surgery  (07/27/24) was complicated by a return to the OR for bleeding 10/9 with open chest, closed on 10/10.  Patient was hospitalized for R pneumothorax on 09/02/24, had tubes put back in for about a week. PMHx includes CAD (prior PCI/stents 2010 pLAD, distal RCA/RPDA, all patent/non-obstructive CAD last LHC on 05/2024), HFpEF, HTN, aortic aneurysm, recurrent cystitis, GERD, prior smoker (80 pack years).  Patient has been compliant with HEP from PT and home  program. Patient sent to this facility for improved strength and balance. Has an AFO On L foot for foot drop.   PAIN:  Are you having pain? No  PRECAUTIONS: Other: s/p  lung transplant  RED FLAGS: None   WEIGHT BEARING RESTRICTIONS: none that patient is aware of  FALLS: Has patient fallen in last 6 months? No  LIVING ENVIRONMENT: Lives with: lives with their spouse Lives in: House/apartment Stairs: 6-7 in back, 2 in the front  Has following equipment at home: Walker - 4 wheeled, shower chair, and Grab bars  PLOF: Independent  PATIENT GOALS: to get stronger  OBJECTIVE:  Note: Objective measures were completed at Evaluation unless otherwise noted.   COGNITION: Overall cognitive status: per wife slight impairment    SENSATION: WFL  COORDINATION: Heel slide: slight decrease coordination of LLE  Finger nose test: WFL   POSTURE: rounded shoulders   LOWER EXTREMITY MMT:    MMT Right Eval Left Eval  Hip flexion 4 4  Hip extension    Hip abduction 4 4-  Hip adduction 4 4  Hip internal rotation    Hip external rotation    Knee flexion 4 4  Knee extension 4 3+  Ankle dorsiflexion 4 Limited by AFO  Ankle plantarflexion 4 Limited by AFO   Ankle inversion    Ankle eversion    (Blank rows = not tested)  BED MOBILITY:  Per patient is ind  TRANSFERS: Sit to stand: Modified independence  Assistive device utilized: None     Stand to sit: Modified independence  Assistive device utilized: None      STAIRS: Findings: Level of Assistance: CGA, Stair Negotiation Technique: Alternating Pattern  with Bilateral Rails, Number of Stairs: 5, Height of Stairs: 6 in   , and Comments: use of afo on L foot  GAIT: Findings: Gait Characteristics: step through pattern and decreased stance time- Left, Distance walked: 45 ft, Level of assistance: CGA, and Comments: able to ambulate shorter durations without rollator, uses rollator for longer distances  FUNCTIONAL TESTS:        PATIENT SURVEYS:  Give ABC next session                                                                                                                               TREATMENT DATE: 11/24/24   TA- To improve functional movements patterns for everyday tasks / TE- To improve strength, endurance, mobility, and function of specific targeted muscle groups or improve joint range of motion or improve muscle flexibility  Nustep: level 2-7 double hill mode with B UE and LE reciprocal movements x 10 min - SOB noted but oxygen  sat as stays in good range(96-98 assessed at 1 min 5 min and upon completion)  cues for breathing techniques due to shortness of breath and fatigue   SPO2 after nustep 96%  HR remained 83-86 throughout nustep endurnance training.   Circuit training x 2 rounds of the below -  Exercise 1: Sit to stand 8x  with 1KG ball press overhead Exercise 2: seated clam shell GTB x 12  Exercise 3: hip flexion x 12 3#AW and GTB Exercise 4: LAQ x 12 bil 3#AW Exercise 5: hip abduction over hedge hog x 10 bil 3# AW   -  Sp02- 97-98% HR 86-88   KoreBalance  The Tux Racer game on the [facility] is a therapeutic, interactive balance training exercise that integrates visual tracking, motor coordination, and postural control.  The [employer] game on the [place] system engages patients in dynamic balance training using an interactive platform that responds to weight shifts and body movements. In this game, patients control a geneticist, molecular (Tux) racing downhill by leaning or shifting their weight on the [employer]. The goal is to collect fish and avoid obstacles, which requires real-time postural adjustments, coordination, and visual-motor integration. Patient requires CGA support and performed intervenwith minimal verbal cueing for improved weigh shift.   PATIENT EDUCATION: Education details: goals, POC, HEP continuation  Person educated: Patient and Spouse Education  method: Explanation, Demonstration, Tactile cues, and Verbal cues Education comprehension: verbalized understanding, returned demonstration, verbal cues required, and tactile cues required  HOME EXERCISE PROGRAM: HEP continuation of prior program, expand next session   Access Code: [email or web address] URL: [email or web address] Date: 11/10/2024 Prepared by: Marina  Moser  Program Notes Do these exercises 2-3 times a week making sure you have a day of rest in between sessions. Before you start a session, warm up with a brisk walk and stretches for your arms, legs and back. Start with 2 sets of 10 of each exercise If you have no pain or swelling, next time do 3 sets of 10. If still no pain the next session, its ok to add some small weight, but back to down to 2 sets of 10 . the next time do 3 sets of 10 with that weight and continue to progress with that pattern.  If you increase weight, decrease reps, or if you increase reps, keep weight the same.  If you do have symptoms,  back off on your weight or reps.  If for some reason, you do skip some scheduled exercise sessions, start over with minimal weight and build up slowly again.Your overall exercise goal is about 150 minutes of moderate intensity (you can talk but not sing)  exercise a week that includes 2 sessions of strength training.  GO FOR IT!  :)  Exercises - Seated Ankle Alphabet  - 1 x daily - 7 x weekly - 2 sets - 1 reps - 5 hold - Squat with Chair Touch  - 2 x weekly - 2-3 sets - 10 reps - Standing Hip Abduction  - 2 x weekly - 2-3 sets - 10 reps - Standing March With Unilateral Resistance  - 1 x daily - 7 x weekly - 2 sets - 10 reps - 5 hold  GOALS: Goals reviewed with patient? Yes  SHORT TERM GOALS: Target date: 11/22/2024    Patient will be independent in home exercise program to improve strength/mobility for better functional independence with ADLs. Baseline: continue current HEP  Goal status: INITIAL   LONG TERM GOALS:  Target date: 01/31/2025  Patient will increase Functional Gait Assessment score to >28/30 as to reduce fall risk and improve dynamic gait safety with community ambulation. Baseline: 1/20: 22  Goal status: INITIAL  2.   Patient will demonstrate an improved Berg Balance Score of > 55 as to demonstrate improved balance with ADLs such as sitting/standing and transfer balance and reduced fall risk.  Baseline: 1/20: 41  Goal status: INITIAL  3.  Patient will increase ABC scale score >80% to demonstrate better functional mobility and better confidence with ADLs.  Baseline: 73.25% Goal status: INITIAL  4.  Patient will increase six minute  walk test distance to >1400 without an AD for progression to community ambulator and improve gait ability Baseline: 1250 with rollator Goal status: INITIAL  5.  Patient (> 64 years old) will complete five times sit to stand test in < 15 seconds indicating an increased LE strength and improved balance. Baseline: 23.28 sec  Goal status: INITIAL    ASSESSMENT:  CLINICAL IMPRESSION:   Patient tolerates progression of Nustep hill intervention to an additional level of 8 without issue. Continued use of [employer] Balance for improved weigh shift and safety with dynamic movements for tux racer x 2 bouts. Tolerated circuit training well with mutliple seated rest breaks between bouts, but SpO2 96-98% throughout session; was able to tolerate added weight of 1KG  ball for 8 reps with sit<>stand. Ongoing skilled intervention will help reduce fall risk, enhance independence with daily activities, and promote long-term graft health through appropriately dosed physical activity.  OBJECTIVE IMPAIRMENTS: Abnormal gait, cardiopulmonary status limiting activity, decreased activity tolerance, decreased balance, decreased coordination, decreased endurance, decreased mobility, difficulty walking, decreased strength, impaired perceived functional ability, improper body mechanics, and  postural dysfunction.   ACTIVITY LIMITATIONS: carrying, lifting, bending, sitting, standing, squatting, stairs, transfers, bathing, toileting, dressing, reach over head, hygiene/grooming, locomotion level, and caring for others  PARTICIPATION LIMITATIONS: meal prep, cleaning, laundry, medication management, interpersonal relationship, driving, shopping, community activity, occupation, and yard work  PERSONAL FACTORS: CAD (prior PCI/stents 2010 pLAD, distal RCA/RPDA, all patent/non-obstructive CAD last LHC on 05/2024), HFpEF, HTN, aortic aneurysm, recurrent cystitis, GERD, prior smoker (80 pack years).  are also affecting patient's functional outcome.   REHAB POTENTIAL: Good  CLINICAL DECISION MAKING: Evolving/moderate complexity  EVALUATION COMPLEXITY: Moderate  PLAN:  PT FREQUENCY: 2x/week  PT DURATION: 12 weeks  PLANNED INTERVENTIONS: 97164- PT Re-evaluation, 97750- Physical Performance Testing, 97110-Therapeutic exercises, 97530- Therapeutic activity, [id number]- Neuromuscular re-education, 97535- Self Care, [id number]- Manual therapy, [id number]- Gait training, [phone]- Orthotic Initial, [phone]- Orthotic/Prosthetic subsequent, [phone]- Canalith repositioning, [id number]- Splinting, [id number]- Ultrasound, 79439 (1-2 muscles), [id number] (3+ muscles)- Dry Needling, Patient/Family education, Balance training, Stair training, Taping, Joint mobilization, Spinal mobilization, Scar mobilization, Compression bandaging, Vestibular training, Visual/preceptual remediation/compensation, Cognitive remediation, DME instructions, Cryotherapy, Moist heat, and Biofeedback  PLAN FOR NEXT SESSION:    Nustep, ankle strengthening, LE strengthening  Dynamic balance as indicated with emphasis on standing tolerance.   Marina  Leopoldo, PT, DPT Physical Therapist - [facility]  Outpatient Physical Therapy- Main Campus [phone]    Massie FORBES Dollar, PT 11/24/2024, 10:19 AM        "

## 2024-11-25 ENCOUNTER — Ambulatory Visit

## 2024-11-29 ENCOUNTER — Ambulatory Visit

## 2024-12-01 ENCOUNTER — Ambulatory Visit

## 2024-12-05 ENCOUNTER — Ambulatory Visit: Admitting: Physical Therapy

## 2024-12-08 ENCOUNTER — Ambulatory Visit

## 2024-12-13 ENCOUNTER — Ambulatory Visit

## 2024-12-15 ENCOUNTER — Ambulatory Visit: Admitting: Physical Therapy

## 2024-12-27 ENCOUNTER — Ambulatory Visit

## 2025-01-02 ENCOUNTER — Ambulatory Visit

## 2025-01-04 ENCOUNTER — Ambulatory Visit

## 2025-01-09 ENCOUNTER — Ambulatory Visit

## 2025-01-11 ENCOUNTER — Ambulatory Visit

## 2025-01-16 ENCOUNTER — Ambulatory Visit

## 2025-01-18 ENCOUNTER — Ambulatory Visit

## 2025-01-23 ENCOUNTER — Ambulatory Visit

## 2025-01-25 ENCOUNTER — Ambulatory Visit

## 2025-01-30 ENCOUNTER — Ambulatory Visit

## 2025-02-01 ENCOUNTER — Ambulatory Visit

## 2025-02-06 ENCOUNTER — Ambulatory Visit

## 2025-02-08 ENCOUNTER — Ambulatory Visit
